# Patient Record
Sex: Female | Born: 1970 | Race: Black or African American | Hispanic: No | Marital: Married | Smoking: Never smoker
Health system: Southern US, Community
[De-identification: ages and names within clinical notes are randomized; demographics above are authoritative.]

## PROBLEM LIST (undated history)

## (undated) DIAGNOSIS — R1310 Dysphagia, unspecified: Secondary | ICD-10-CM

## (undated) DIAGNOSIS — R12 Heartburn: Secondary | ICD-10-CM

## (undated) DIAGNOSIS — R0602 Shortness of breath: Secondary | ICD-10-CM

## (undated) DIAGNOSIS — M1990 Unspecified osteoarthritis, unspecified site: Secondary | ICD-10-CM

## (undated) DIAGNOSIS — R079 Chest pain, unspecified: Secondary | ICD-10-CM

## (undated) DIAGNOSIS — K219 Gastro-esophageal reflux disease without esophagitis: Secondary | ICD-10-CM

## (undated) DIAGNOSIS — J189 Pneumonia, unspecified organism: Secondary | ICD-10-CM

## (undated) DIAGNOSIS — E739 Lactose intolerance, unspecified: Secondary | ICD-10-CM

## (undated) DIAGNOSIS — G4733 Obstructive sleep apnea (adult) (pediatric): Secondary | ICD-10-CM

## (undated) DIAGNOSIS — G43909 Migraine, unspecified, not intractable, without status migrainosus: Secondary | ICD-10-CM

## (undated) DIAGNOSIS — K5900 Constipation, unspecified: Secondary | ICD-10-CM

## (undated) DIAGNOSIS — I10 Essential (primary) hypertension: Secondary | ICD-10-CM

## (undated) DIAGNOSIS — R002 Palpitations: Secondary | ICD-10-CM

## (undated) DIAGNOSIS — E559 Vitamin D deficiency, unspecified: Secondary | ICD-10-CM

## (undated) DIAGNOSIS — Z975 Presence of (intrauterine) contraceptive device: Secondary | ICD-10-CM

## (undated) DIAGNOSIS — Z91018 Allergy to other foods: Secondary | ICD-10-CM

## (undated) DIAGNOSIS — E785 Hyperlipidemia, unspecified: Secondary | ICD-10-CM

## (undated) DIAGNOSIS — E119 Type 2 diabetes mellitus without complications: Secondary | ICD-10-CM

## (undated) DIAGNOSIS — N3010 Interstitial cystitis (chronic) without hematuria: Secondary | ICD-10-CM

## (undated) DIAGNOSIS — M549 Dorsalgia, unspecified: Secondary | ICD-10-CM

## (undated) DIAGNOSIS — M2550 Pain in unspecified joint: Secondary | ICD-10-CM

## (undated) HISTORY — DX: Obstructive sleep apnea (adult) (pediatric): G47.33

## (undated) HISTORY — DX: Lactose intolerance, unspecified: E73.9

## (undated) HISTORY — DX: Heartburn: R12

## (undated) HISTORY — DX: Palpitations: R00.2

## (undated) HISTORY — DX: Shortness of breath: R06.02

## (undated) HISTORY — DX: Essential (primary) hypertension: I10

## (undated) HISTORY — DX: Type 2 diabetes mellitus without complications: E11.9

## (undated) HISTORY — DX: Interstitial cystitis (chronic) without hematuria: N30.10

## (undated) HISTORY — DX: Allergy to other foods: Z91.018

## (undated) HISTORY — DX: Dysphagia, unspecified: R13.10

## (undated) HISTORY — DX: Hyperlipidemia, unspecified: E78.5

## (undated) HISTORY — DX: Pain in unspecified joint: M25.50

## (undated) HISTORY — DX: Unspecified osteoarthritis, unspecified site: M19.90

## (undated) HISTORY — DX: Dorsalgia, unspecified: M54.9

## (undated) HISTORY — DX: Chest pain, unspecified: R07.9

## (undated) HISTORY — DX: Constipation, unspecified: K59.00

## (undated) HISTORY — DX: Vitamin D deficiency, unspecified: E55.9

## (undated) HISTORY — PX: OTHER SURGICAL HISTORY: SHX169

---

## 1997-06-21 ENCOUNTER — Other Ambulatory Visit: Admission: RE | Admit: 1997-06-21 | Discharge: 1997-06-21 | Payer: Self-pay | Admitting: Obstetrics and Gynecology

## 1998-06-10 ENCOUNTER — Other Ambulatory Visit: Admission: RE | Admit: 1998-06-10 | Discharge: 1998-06-10 | Payer: Self-pay | Admitting: Obstetrics and Gynecology

## 1998-06-17 ENCOUNTER — Encounter: Admission: RE | Admit: 1998-06-17 | Discharge: 1998-09-15 | Payer: Self-pay | Admitting: Obstetrics & Gynecology

## 1998-08-02 ENCOUNTER — Inpatient Hospital Stay (HOSPITAL_COMMUNITY): Admission: AD | Admit: 1998-08-02 | Discharge: 1998-08-02 | Payer: Self-pay | Admitting: Obstetrics and Gynecology

## 1998-10-10 ENCOUNTER — Inpatient Hospital Stay (HOSPITAL_COMMUNITY): Admission: AD | Admit: 1998-10-10 | Discharge: 1998-10-10 | Payer: Self-pay | Admitting: Obstetrics and Gynecology

## 1998-11-18 ENCOUNTER — Inpatient Hospital Stay (HOSPITAL_COMMUNITY): Admission: AD | Admit: 1998-11-18 | Discharge: 1998-11-21 | Payer: Self-pay | Admitting: Obstetrics and Gynecology

## 1998-11-23 ENCOUNTER — Inpatient Hospital Stay (HOSPITAL_COMMUNITY): Admission: AD | Admit: 1998-11-23 | Discharge: 1998-11-23 | Payer: Self-pay | Admitting: Obstetrics & Gynecology

## 1998-12-01 ENCOUNTER — Inpatient Hospital Stay (HOSPITAL_COMMUNITY): Admission: AD | Admit: 1998-12-01 | Discharge: 1998-12-04 | Payer: Self-pay | Admitting: Obstetrics and Gynecology

## 1999-01-02 ENCOUNTER — Other Ambulatory Visit: Admission: RE | Admit: 1999-01-02 | Discharge: 1999-01-02 | Payer: Self-pay | Admitting: Obstetrics and Gynecology

## 2000-08-01 ENCOUNTER — Other Ambulatory Visit: Admission: RE | Admit: 2000-08-01 | Discharge: 2000-08-01 | Payer: Self-pay | Admitting: Obstetrics and Gynecology

## 2001-07-28 ENCOUNTER — Emergency Department (HOSPITAL_COMMUNITY): Admission: EM | Admit: 2001-07-28 | Discharge: 2001-07-28 | Payer: Self-pay | Admitting: Emergency Medicine

## 2001-08-01 ENCOUNTER — Encounter: Payer: Self-pay | Admitting: Internal Medicine

## 2001-08-01 ENCOUNTER — Encounter: Admission: RE | Admit: 2001-08-01 | Discharge: 2001-08-01 | Payer: Self-pay | Admitting: Internal Medicine

## 2001-08-04 ENCOUNTER — Other Ambulatory Visit: Admission: RE | Admit: 2001-08-04 | Discharge: 2001-08-04 | Payer: Self-pay | Admitting: Obstetrics and Gynecology

## 2001-09-27 ENCOUNTER — Encounter: Admission: RE | Admit: 2001-09-27 | Discharge: 2001-12-26 | Payer: Self-pay | Admitting: Internal Medicine

## 2002-08-07 ENCOUNTER — Other Ambulatory Visit: Admission: RE | Admit: 2002-08-07 | Discharge: 2002-08-07 | Payer: Self-pay | Admitting: Obstetrics and Gynecology

## 2003-11-15 ENCOUNTER — Other Ambulatory Visit: Admission: RE | Admit: 2003-11-15 | Discharge: 2003-11-15 | Payer: Self-pay | Admitting: Obstetrics and Gynecology

## 2004-09-14 ENCOUNTER — Emergency Department (HOSPITAL_COMMUNITY): Admission: EM | Admit: 2004-09-14 | Discharge: 2004-09-15 | Payer: Self-pay | Admitting: Emergency Medicine

## 2004-09-29 ENCOUNTER — Ambulatory Visit (HOSPITAL_COMMUNITY): Admission: RE | Admit: 2004-09-29 | Discharge: 2004-09-29 | Payer: Self-pay | Admitting: Neurology

## 2005-01-20 ENCOUNTER — Other Ambulatory Visit: Admission: RE | Admit: 2005-01-20 | Discharge: 2005-01-20 | Payer: Self-pay | Admitting: Obstetrics and Gynecology

## 2005-06-15 ENCOUNTER — Encounter (INDEPENDENT_AMBULATORY_CARE_PROVIDER_SITE_OTHER): Payer: Self-pay | Admitting: *Deleted

## 2005-06-15 ENCOUNTER — Ambulatory Visit (HOSPITAL_COMMUNITY): Admission: RE | Admit: 2005-06-15 | Discharge: 2005-06-15 | Payer: Self-pay | Admitting: Obstetrics and Gynecology

## 2006-01-19 ENCOUNTER — Encounter: Admission: RE | Admit: 2006-01-19 | Discharge: 2006-01-19 | Payer: Self-pay | Admitting: Orthopedic Surgery

## 2007-12-07 ENCOUNTER — Encounter: Admission: RE | Admit: 2007-12-07 | Discharge: 2007-12-07 | Payer: Self-pay | Admitting: Specialist

## 2008-09-09 ENCOUNTER — Emergency Department (HOSPITAL_COMMUNITY): Admission: EM | Admit: 2008-09-09 | Discharge: 2008-09-09 | Payer: Self-pay | Admitting: Emergency Medicine

## 2009-01-23 ENCOUNTER — Ambulatory Visit (HOSPITAL_BASED_OUTPATIENT_CLINIC_OR_DEPARTMENT_OTHER): Admission: RE | Admit: 2009-01-23 | Discharge: 2009-01-23 | Payer: Self-pay | Admitting: Urology

## 2009-10-20 ENCOUNTER — Emergency Department (HOSPITAL_COMMUNITY): Admission: EM | Admit: 2009-10-20 | Discharge: 2009-10-21 | Payer: Self-pay | Admitting: Emergency Medicine

## 2010-05-29 LAB — RAPID STREP SCREEN (MED CTR MEBANE ONLY): Streptococcus, Group A Screen (Direct): NEGATIVE

## 2010-06-17 LAB — POCT PREGNANCY, URINE: Preg Test, Ur: NEGATIVE

## 2010-06-22 LAB — URINALYSIS, ROUTINE W REFLEX MICROSCOPIC
Nitrite: NEGATIVE
Protein, ur: NEGATIVE mg/dL
Specific Gravity, Urine: 1.014 (ref 1.005–1.030)
Urobilinogen, UA: 0.2 mg/dL (ref 0.0–1.0)

## 2010-06-22 LAB — DIFFERENTIAL
Basophils Absolute: 0 10*3/uL (ref 0.0–0.1)
Eosinophils Absolute: 0.1 10*3/uL (ref 0.0–0.7)
Eosinophils Relative: 1 % (ref 0–5)
Lymphocytes Relative: 28 % (ref 12–46)
Monocytes Absolute: 0.6 10*3/uL (ref 0.1–1.0)

## 2010-06-22 LAB — CBC
MCV: 84.7 fL (ref 78.0–100.0)
Platelets: 208 10*3/uL (ref 150–400)
WBC: 9.8 10*3/uL (ref 4.0–10.5)

## 2010-06-22 LAB — COMPREHENSIVE METABOLIC PANEL
ALT: 20 U/L (ref 0–35)
AST: 26 U/L (ref 0–37)
Albumin: 3.7 g/dL (ref 3.5–5.2)
Chloride: 108 mEq/L (ref 96–112)
Creatinine, Ser: 0.77 mg/dL (ref 0.4–1.2)
GFR calc Af Amer: >60 mL/min (ref >60)
Potassium: 3.9 mEq/L (ref 3.5–5.1)
Sodium: 139 mEq/L (ref 135–145)
Total Bilirubin: 0.4 mg/dL (ref 0.3–1.2)

## 2010-07-31 NOTE — H&P (Signed)
NAME:  Emily Hester, Emily Hester          ACCOUNT NO.:  [id number]   MEDICAL RECORD NO.:  [id number]          PATIENT TYPE:  AMB   LOCATION:  [facility]                           FACILITY:  [facility]   PHYSICIAN:  Guy Sandifer. Henderson Cloud, M.D. DATE OF BIRTH:  [birth year]   DATE OF ADMISSION:  06/15/2005  DATE OF DISCHARGE:                                HISTORY & PHYSICAL   CHIEF COMPLAINT:  Miscarriage.   HISTORY OF PRESENT ILLNESS:  This patient is a 40 year old African-American  female, G5, P3 with a last menstrual period of April 09, 2005. Ultrasound  on June 07, 2005 was consistent with an intrauterine pregnancy at 6 weeks  and 3 days with a fetal heart beat of 104 beats a minute. Repeat ultrasound  on June 14, 2005 was consistent with an intrauterine pregnancy at 6 weeks  and 2 days with no fetal heart beat on prolonged observation. The patient  has no bleeding or heavy cramping at present. Options are reviewed with the  patient and she is being admitted for dilatation and evacuation. The  potential risks and complications have been reviewed preoperatively.   PAST MEDICAL HISTORY:  1.  History of hypothyroidism no longer on replacement therapy.  2.  Migraine headaches.   PAST SURGICAL HISTORY:  1.  Knee surgery in 1997.  2.  Wisdom tooth extraction.  3.  Vaginal delivery x3, termination x1.   FAMILY HISTORY:  Heart disease maternal grandmother, chronic hypertension  father, maternal grandmother kidney disease, father, asthma in brother,  diabetes in mother, father, sister, brother, paternal grandmother.   MEDICATIONS:  Prenatal vitamins.   ALLERGIES:  AMOXICILLIN leading to swelling and hives. BACTRIM leading to  hives.   SOCIAL HISTORY:  Denies tobacco, alcohol or drug abuse.   REVIEW OF SYSTEMS:  NEUROLOGIC:  History of headache as above. CARDIO:  Denies chest pain. PULMONARY:  Denies shortness of breath. GI:  Denies  recent changes in bowel habits.   PHYSICAL EXAMINATION:  VITAL SIGNS:   Height 5 feet 5 inches. Weight 259.5  pounds. Blood pressure 122/70.  HEENT:  Without thyromegaly.  LUNGS:  Clear to auscultation.  HEART:  Regular rate and rhythm.  BACK:  Without CVA tenderness.  BREASTS:  Not examined.  ABDOMEN:  Obese, soft, nontender without palpable masses.  PELVIC:  Deferred.  EXTREMITIES:  Grossly within normal limits.  NEUROLOGIC:  Grossly within normal limits.   LABORATORY DATA:  Blood type O+, Rh antibody screen negative.   ASSESSMENT:  Spontaneous abortion.   PLAN:  Dilatation and evacuation.      Guy Sandifer Henderson Cloud, M.D.  Electronically Signed     JET/MEDQ  D:  06/15/2005  T:  06/15/2005  Job:  [id number]

## 2010-07-31 NOTE — Op Note (Signed)
NAME:  Emily Hester, Emily Hester          ACCOUNT NO.:  [id number]   MEDICAL RECORD NO.:  [id number]          PATIENT TYPE:  AMB   LOCATION:  [facility]                           [facility]ACILITY:  [facility]   PHYSICIAN:  Guy Sandifer. Henderson Cloud, M.D. DATE OF BIRTH:  [birth year]   DATE OF PROCEDURE:  06/15/2005  DATE OF DISCHARGE:                                 OPERATIVE REPORT   PREOPERATIVE DIAGNOSIS:  Spontaneous abortion.   POSTOPERATIVE DIAGNOSIS:  Spontaneous abortion.   PROCEDURES:  1.  Dilatation and evacuation.  2.  1% Xylocaine paracervical block.   SURGEON:  Guy Sandifer. Henderson Cloud, M.D.   ANESTHESIA:  MAC.   SPECIMENS:  Products of conception.   ESTIMATED BLOOD LOSS:  Minimal.   INDICATIONS AND CONSENT:  This patient is a 40 year old married white  female, G5, P3, who on ultrasound has an intrauterine pregnancy with a crown-  rump length of 6 weeks 2 days with no fetal heart beat on prolonged  examination.  A diagnosis of spontaneous abortion is made.  Options were  discussed and the patient favors dilatation and evacuation.  The potential  risks are discussed preoperatively, including but not limited to infection,  uterine perforation, organ damage, bleeding requiring transfusion of blood  products with possible transfusion reaction, HIV and hepatitis acquisition,  DVT, PE and pneumonia.  All questions were answered and consent is signed on  the chart.   PROCEDURE:  The patient is taken to the operating room, where she is  identified and placed in the dorsal supine position and intravenous sedation  is given.  She is then placed in the dorsal lithotomy position and prepped,  the bladder is straight-catheterized, and draped in a sterile fashion.  A  bivalve speculum is placed in the vagina.  The anterior cervical lip is  injected with 1% Xylocaine and grasped with a single-tooth tenaculum.  A  paracervical block is placed at the 2, 4, 5, 7, 8 and 10 o'clock positions  with approximately 20 mL total of  1% plain Xylocaine.  The cervix is then  gently progressively dilated using a 29 dilator.  A #7 curved curette is  then placed and suction curettage is carried out for obvious products of  conception.  Alternating sharp and suction curettage is carried out  until the cavity is clean.  Pitocin 20 units are added to a liter of IV  fluids after the initial pass of the suction curette.  Good hemostasis is  noted.  All instruments are removed.  All counts are correct.  The patient  is taken to the recovery room in stable condition.      Guy Sandifer Henderson Cloud, M.D.  Electronically Signed     JET/MEDQ  D:  06/15/2005  T:  06/16/2005  Job:  [id number]

## 2010-07-31 NOTE — Procedures (Signed)
HISTORY OF PRESENT ILLNESS:  This is a 40 year old patient who is being  evaluated for seizure-type episode on October 11, 2004.  The patient had  generalized shaking at that time.  The patient has a history of migraine  headaches.  This is a routine EEG.  No skull defects are noted.   EEG CLASSIFICATION:  Normal awake and drowsy.   DESCRIPTION OF RECORDING:  Background rhythms reveal evidence of an 8 Hertz,  fairly well modulated, medium amplitude alpha rhythm that is reactive to eye  opening and closure.  As record progresses, the patient seems to drift in  and out of the drowsy state during the early phases of the recording with  some occasional vertex sharp wave activity seen and evidence of background  slowing in the 7 Hertz range.  The patient eventually undergoes photic  stimulation with an excellent bilateral photic driving response noted.  Hyperventilation is then performed resulting in a good buildup of background  rhythm activities with mild 7 Hertz theta frequency slowing seen during  hyperventilation.  At no time during the recording does there appear to be  evidence of spike or spike wave discharges or evidence of focal slowing.  EKG monitor shows no evidence of cardiac rhythm abnormalities with heart  rate of 84.   IMPRESSION:  This is a normal EEG recording in the awake and drowsy state.  No evidence of ictal or anictal discharges were seen.       EAV:WUJW  D:  09/29/2004 11:00:02  T:  09/29/2004 11:38:39  Job #:  [id number]

## 2010-10-30 ENCOUNTER — Ambulatory Visit
Admission: RE | Admit: 2010-10-30 | Discharge: 2010-10-30 | Disposition: A | Discharge status: Home / Self Care | Payer: 59 | Source: Ambulatory Visit | Attending: Internal Medicine | Admitting: Internal Medicine

## 2010-10-30 ENCOUNTER — Other Ambulatory Visit: Payer: Self-pay | Admitting: Internal Medicine

## 2010-10-30 DIAGNOSIS — R05 Cough: Secondary | ICD-10-CM

## 2010-10-30 DIAGNOSIS — R0602 Shortness of breath: Secondary | ICD-10-CM

## 2011-03-17 ENCOUNTER — Ambulatory Visit: Payer: Self-pay

## 2011-06-12 ENCOUNTER — Ambulatory Visit (INDEPENDENT_AMBULATORY_CARE_PROVIDER_SITE_OTHER): Payer: 59 | Admitting: Family Medicine

## 2011-06-12 ENCOUNTER — Ambulatory Visit: Payer: 59

## 2011-06-12 VITALS — BP 121/81 | HR 74 | Temp 98.9°F | Resp 16 | Ht 65.5 in | Wt 276.0 lb

## 2011-06-12 DIAGNOSIS — M25569 Pain in unspecified knee: Secondary | ICD-10-CM

## 2011-06-12 DIAGNOSIS — M549 Dorsalgia, unspecified: Secondary | ICD-10-CM

## 2011-06-12 DIAGNOSIS — M79609 Pain in unspecified limb: Secondary | ICD-10-CM

## 2011-06-12 DIAGNOSIS — M25579 Pain in unspecified ankle and joints of unspecified foot: Secondary | ICD-10-CM

## 2011-06-12 DIAGNOSIS — M79643 Pain in unspecified hand: Secondary | ICD-10-CM

## 2011-06-12 MED ORDER — CYCLOBENZAPRINE HCL 10 MG PO TABS: 10.0000 mg | ORAL_TABLET | Freq: Every evening | ORAL | Status: AC | PRN

## 2011-06-12 MED ORDER — NAPROXEN 500 MG PO TABS: 500.0000 mg | ORAL_TABLET | Freq: Two times a day (BID) | ORAL | Status: DC

## 2011-06-12 MED ORDER — TRAMADOL HCL 50 MG PO TABS: 50.0000 mg | ORAL_TABLET | Freq: Three times a day (TID) | ORAL | Status: AC | PRN

## 2011-06-12 NOTE — Progress Notes (Signed)
[facility]:  Office Visit  Chief Complaint:  Chief Complaint  Patient presents with  . Back Pain    mid to low back  . Ankle Pain    r ankle    HPI: Emily Hester is a 41 y.o. female who complains of right ankle pain with swelling, radiating pain  at lateral malleoli and some knee pain, and back pain after falling down stairs in front of house. Denies numbness. +Tingling, + limping. No weakness. Associated with Low back pain and Left middle finger swelling. Tried OTC meds without relief.    Past Medical History  Diagnosis Date  . Interstitial cystitis   . Asthma   . Hypertension    Past Surgical History  Procedure Date  . Left knee surgery     History   Social History  . Marital Status: Married    Spouse Name: N/A    Number of Children: N/A  . Years of Education: N/A   Social History Main Topics  . Smoking status: Never Smoker   . Smokeless tobacco: None  . Alcohol Use: No  . Drug Use: No  . Sexually Active: None   Other Topics Concern  . None   Social History Narrative  . None   Family History  Problem Relation Age of Onset  . Diabetes Mother   . Heart disease Mother   . Diabetes Father    Allergies  Allergen Reactions  . Amoxicillin Hives and Swelling  . Bactrim Rash   Prior to Admission medications   Medication Sig Start Date End Date Taking? Authorizing Provider  albuterol (PROVENTIL HFA;VENTOLIN HFA) 108 (90 BASE) MCG/ACT inhaler Inhale 2 puffs into the lungs every 6 (six) hours as needed.   Yes Historical Provider, MD  budesonide-formoterol (SYMBICORT) 160-4.5 MCG/ACT inhaler Inhale 2 puffs into the lungs 2 (two) times daily.   Yes Historical Provider, MD  losartan-hydrochlorothiazide (HYZAAR) 50-12.5 MG per tablet Take 1 tablet by mouth daily.   Yes Historical Provider, MD  nebivolol (BYSTOLIC) 10 MG tablet Take 10 mg by mouth daily.   Yes Historical Provider, MD  pentosan polysulfate (ELMIRON) 100 MG capsule Take 100  mg by mouth 3 (three) times daily as needed.    Historical Provider, MD     ROS: The patient denies fevers, chills, night sweats, unintentional weight loss, chest pain, palpitations, wheezing, dyspnea on exertion, nausea, vomiting, abdominal pain, dysuria, hematuria, melena, numbness, weakness, +tingling. + pain  All other systems have been reviewed and were otherwise negative with the exception of those mentioned in the HPI and as above.    PHYSICAL EXAM: Filed Vitals:   06/12/11 1027  BP: 121/81  Pulse: 74  Temp: 98.9 F (37.2 C)  Resp: 16   Filed Vitals:   06/12/11 1027  Height: 5' 5.5" (1.664 m)  Weight: 276 lb (125.193 kg)   Body mass index is 45.23 kg/(m^2).  General: Alert, no acute distress, morbidly obese HEENT:  Normocephalic, atraumatic, oropharynx patent. EOMI, PERRLA,  Cardiovascular:  Regular rate and rhythm, no rubs murmurs or gallops.  No Carotid bruits, radial pulse intact. No pedal edema.  Respiratory: Clear to auscultation bilaterally.  No wheezes, rales, or rhonchi.  No cyanosis, no use of accessory musculature GI: No organomegaly, abdomen is soft and non-tender, positive bowel sounds.  No masses. Skin: No rashes. Neurologic: Facial musculature symmetric. Psychiatric: Patient is appropriate throughout our interaction. Lymphatic: No cervical lymphadenopathy Musculoskeletal: Gait intact. + normal ankle exam except for tenderness  and swelling at lateral malleoli, +DP pulse. 5/5 strength, sensation intact +hand exam normal for middle left finger, + radila pulse , 5/5 strength, sensation intact + low back pain left paraspinal msk tenderness. Full PROM but not AROM due to pain. Neg straight leg seated.   LABS:    EKG/XRAY:   Primary read interpreted by Dr. Conley Rolls at [facility]. Hand-no fx/dislocation Ankle-no fx/dislocation Back-no fx/dislocation Knee-? arthritis     ASSESSMENT/PLAN: Encounter Diagnoses  Name Primary?  . Knee pain Yes  . Ankle pain   .  Back pain   . Hand pain    Back, Knee, Ankle, Hand  strain/sprain s/p fall Rx Naproxen Rx Flexeril qhs Rx Tramadol prn Work note for light duty F/u in 2 weeks  Jlon Betker PHUONG, DO 06/12/2011 12:03 PM

## 2011-06-24 ENCOUNTER — Ambulatory Visit (INDEPENDENT_AMBULATORY_CARE_PROVIDER_SITE_OTHER): Payer: 59 | Admitting: Family Medicine

## 2011-06-24 VITALS — BP 146/88 | HR 72 | Temp 99.2°F | Resp 16 | Wt 284.6 lb

## 2011-06-24 DIAGNOSIS — S93609A Unspecified sprain of unspecified foot, initial encounter: Secondary | ICD-10-CM

## 2011-06-24 DIAGNOSIS — S93409A Sprain of unspecified ligament of unspecified ankle, initial encounter: Secondary | ICD-10-CM

## 2011-06-24 NOTE — Progress Notes (Signed)
  Subjective:    Patient ID: Emily Hester, female    DOB: 1970-10-04, 41 y.o.   MRN: [id number]  HPI 41 yo female seen 3/30 following a fall for pain in multiple sites.  Hand, back, knee, ankle - all xrays negative.  Wearing CAM on right ankle and on light duty at work.  Pain is better but still hurts some, occ shoots up lateral leg.  Overall better though.  Other injuries all better.   Review of Systems Negative except as per HPI     Objective:   Physical Exam  Constitutional: She appears well-developed.  Pulmonary/Chest: Effort normal.  Neurological: She is alert.   Right ankle - FROM.  Some pain with resisted dorsiflexion.  TTP over ATF lig.         Assessment & Plan:  Ankle sprain - improving.  Now try sweedo with work boots.  Can return to work but no running for another 7-10 days.

## 2011-09-18 ENCOUNTER — Ambulatory Visit (INDEPENDENT_AMBULATORY_CARE_PROVIDER_SITE_OTHER): Payer: 59 | Admitting: Emergency Medicine

## 2011-09-18 VITALS — BP 118/72 | HR 74 | Temp 99.0°F | Resp 18 | Ht 61.0 in | Wt 282.6 lb

## 2011-09-18 DIAGNOSIS — J018 Other acute sinusitis: Secondary | ICD-10-CM

## 2011-09-18 DIAGNOSIS — J029 Acute pharyngitis, unspecified: Secondary | ICD-10-CM

## 2011-09-18 DIAGNOSIS — J40 Bronchitis, not specified as acute or chronic: Secondary | ICD-10-CM

## 2011-09-18 MED ORDER — CEFPROZIL 500 MG PO TABS: 500.0000 mg | ORAL_TABLET | Freq: Two times a day (BID) | ORAL | Status: DC

## 2011-09-18 NOTE — Progress Notes (Signed)
  Subjective:    Patient ID: Emily Hester, female    DOB: 06-16-1970, 41 y.o.   MRN: [id number]  Sore Throat  This is a new problem. The current episode started in the past 7 days. The problem has been unchanged. Neither side of throat is experiencing more pain than the other. The maximum temperature recorded prior to her arrival was 100 - 100.9 F. The pain is at a severity of 3/10. The pain is mild. Associated symptoms include congestion, coughing and a hoarse voice. Pertinent negatives include no abdominal pain, diarrhea, drooling, ear discharge, ear pain, headaches, plugged ear sensation, neck pain, shortness of breath, stridor, swollen glands, trouble swallowing or vomiting. She has tried nothing for the symptoms.  Cough This is a new problem. The current episode started in the past 7 days. The problem has been unchanged. The problem occurs constantly. The cough is non-productive. Associated symptoms include a fever, nasal congestion, postnasal drip and a sore throat. Pertinent negatives include no chest pain, chills, ear congestion, ear pain, headaches, heartburn, hemoptysis, myalgias, rash, rhinorrhea, shortness of breath, sweats, weight loss or wheezing. Nothing aggravates the symptoms. She has tried nothing for the symptoms. There is no history of asthma, bronchiectasis, bronchitis, COPD, emphysema, environmental allergies or pneumonia.      Review of Systems  Constitutional: Positive for fever. Negative for chills and weight loss.  HENT: Positive for congestion, sore throat, hoarse voice and postnasal drip. Negative for ear pain, rhinorrhea, drooling, trouble swallowing, neck pain and ear discharge.   Respiratory: Positive for cough. Negative for hemoptysis, shortness of breath, wheezing and stridor.   Cardiovascular: Negative for chest pain.  Gastrointestinal: Negative for heartburn, vomiting, abdominal pain and diarrhea.  Musculoskeletal: Negative for myalgias.  Skin: Negative for  rash.  Neurological: Negative for headaches.  Hematological: Negative for environmental allergies.  All other systems reviewed and are negative.       Objective:   Physical Exam  Constitutional: She is oriented to person, place, and time. She appears well-developed and well-nourished.  HENT:  Head: Normocephalic and atraumatic.  Right Ear: External ear normal.  Left Ear: External ear normal.  Eyes: Conjunctivae are normal. Pupils are equal, round, and reactive to light. No scleral icterus.  Neck: Normal range of motion. Neck supple.  Cardiovascular: Normal rate and regular rhythm.   Pulmonary/Chest: Effort normal and breath sounds normal.  Abdominal: Soft.  Musculoskeletal: Normal range of motion.  Lymphadenopathy:    She has cervical adenopathy.  Neurological: She is oriented to person, place, and time.  Skin: Skin is warm.          Assessment & Plan:  Incidental pregnancy Cefzil Sudafed Robitussin Follow up as needed

## 2011-09-27 ENCOUNTER — Encounter (HOSPITAL_COMMUNITY): Payer: Self-pay | Admitting: Pharmacist

## 2011-09-27 ENCOUNTER — Other Ambulatory Visit: Payer: Self-pay | Admitting: Obstetrics and Gynecology

## 2011-09-29 MED ORDER — GENTAMICIN SULFATE 40 MG/ML IJ SOLN
5.0000 mg/kg | INTRAVENOUS | Status: AC
  Administered 2011-09-30: 360 mg via INTRAVENOUS
  Filled 2011-09-29: qty 9

## 2011-09-29 MED ORDER — METRONIDAZOLE IN NACL 5-0.79 MG/ML-% IV SOLN
500.0000 mg | INTRAVENOUS | Status: AC
  Administered 2011-09-30: 500 mg via INTRAVENOUS
  Filled 2011-09-29: qty 100

## 2011-09-30 ENCOUNTER — Encounter (HOSPITAL_COMMUNITY): Payer: Self-pay | Admitting: Anesthesiology

## 2011-09-30 ENCOUNTER — Ambulatory Visit (HOSPITAL_COMMUNITY): Payer: 59 | Admitting: Anesthesiology

## 2011-09-30 ENCOUNTER — Ambulatory Visit (HOSPITAL_COMMUNITY)
Admission: AD | Admit: 2011-09-30 | Discharge: 2011-09-30 | Disposition: A | Discharge status: Home / Self Care | Payer: 59 | Source: Ambulatory Visit | Attending: Obstetrics and Gynecology | Admitting: Obstetrics and Gynecology

## 2011-09-30 ENCOUNTER — Encounter (HOSPITAL_COMMUNITY)
Admission: AD | Disposition: A | Discharge status: Home / Self Care | Payer: Self-pay | Source: Ambulatory Visit | Attending: Obstetrics and Gynecology

## 2011-09-30 DIAGNOSIS — O021 Missed abortion: Secondary | ICD-10-CM | POA: Insufficient documentation

## 2011-09-30 HISTORY — PX: DILATION AND EVACUATION: SHX1459

## 2011-09-30 LAB — CBC
MCH: 27 pg (ref 26.0–34.0)
MCHC: 31.9 g/dL (ref 30.0–36.0)
MCV: 84.6 fL (ref 78.0–100.0)
Platelets: 252 10*3/uL (ref 150–400)
RDW: 15.3 % (ref 11.5–15.5)

## 2011-09-30 SURGERY — DILATION AND EVACUATION, UTERUS
Anesthesia: Choice | Site: Vagina | Wound class: Clean Contaminated

## 2011-09-30 MED ORDER — LACTATED RINGERS IV SOLN
INTRAVENOUS | Status: DC | PRN
  Administered 2011-09-30 (×2): via INTRAVENOUS

## 2011-09-30 MED ORDER — IBUPROFEN 200 MG PO TABS: 600.0000 mg | ORAL_TABLET | Freq: Four times a day (QID) | ORAL | Status: AC | PRN

## 2011-09-30 MED ORDER — HYDROCODONE-ACETAMINOPHEN 5-500 MG PO TABS: 1.0000 | ORAL_TABLET | Freq: Four times a day (QID) | ORAL | Status: AC | PRN

## 2011-09-30 MED ORDER — DEXAMETHASONE SODIUM PHOSPHATE 4 MG/ML IJ SOLN
INTRAMUSCULAR | Status: DC | PRN
  Administered 2011-09-30: 4 mg via INTRAVENOUS

## 2011-09-30 MED ORDER — HYDROMORPHONE HCL PF 1 MG/ML IJ SOLN
0.2500 mg | INTRAMUSCULAR | Status: DC | PRN
  Administered 2011-09-30 (×2): 0.5 mg via INTRAVENOUS

## 2011-09-30 MED ORDER — MIDAZOLAM HCL 2 MG/2ML IJ SOLN
INTRAMUSCULAR | Status: AC
  Filled 2011-09-30: qty 2

## 2011-09-30 MED ORDER — GLYCOPYRROLATE 0.2 MG/ML IJ SOLN
INTRAMUSCULAR | Status: DC | PRN
  Administered 2011-09-30: 0.2 mg via INTRAVENOUS

## 2011-09-30 MED ORDER — ONDANSETRON HCL 4 MG/2ML IJ SOLN
INTRAMUSCULAR | Status: DC | PRN
  Administered 2011-09-30: 4 mg via INTRAVENOUS

## 2011-09-30 MED ORDER — HYDROCODONE-ACETAMINOPHEN 5-325 MG PO TABS
1.0000 | ORAL_TABLET | Freq: Once | ORAL | Status: AC
  Administered 2011-09-30: 1 via ORAL

## 2011-09-30 MED ORDER — PROPOFOL 10 MG/ML IV EMUL
INTRAVENOUS | Status: AC
  Filled 2011-09-30: qty 20

## 2011-09-30 MED ORDER — FENTANYL CITRATE 0.05 MG/ML IJ SOLN
INTRAMUSCULAR | Status: DC | PRN
  Administered 2011-09-30: 75 ug via INTRAVENOUS
  Administered 2011-09-30: 25 ug via INTRAVENOUS

## 2011-09-30 MED ORDER — KETOROLAC TROMETHAMINE 30 MG/ML IJ SOLN
INTRAMUSCULAR | Status: AC
  Filled 2011-09-30: qty 1

## 2011-09-30 MED ORDER — GLYCOPYRROLATE 0.2 MG/ML IJ SOLN
INTRAMUSCULAR | Status: AC
  Filled 2011-09-30: qty 1

## 2011-09-30 MED ORDER — PROPOFOL 10 MG/ML IV EMUL
INTRAVENOUS | Status: DC | PRN
  Administered 2011-09-30: 250 mg via INTRAVENOUS
  Administered 2011-09-30: 100 mg via INTRAVENOUS

## 2011-09-30 MED ORDER — LIDOCAINE HCL 1 % IJ SOLN
INTRAMUSCULAR | Status: DC | PRN
  Administered 2011-09-30: 20 mL

## 2011-09-30 MED ORDER — OXYTOCIN 10 UNIT/ML IJ SOLN
INTRAMUSCULAR | Status: DC | PRN
  Administered 2011-09-30: 20 [IU] via INTRAMUSCULAR

## 2011-09-30 MED ORDER — HYDROCODONE-ACETAMINOPHEN 5-325 MG PO TABS
ORAL_TABLET | ORAL | Status: AC
  Filled 2011-09-30: qty 1

## 2011-09-30 MED ORDER — HYDROMORPHONE HCL PF 1 MG/ML IJ SOLN
INTRAMUSCULAR | Status: AC
  Administered 2011-09-30: 0.5 mg via INTRAVENOUS
  Filled 2011-09-30: qty 1

## 2011-09-30 MED ORDER — OXYTOCIN 10 UNIT/ML IJ SOLN
INTRAMUSCULAR | Status: AC
  Filled 2011-09-30: qty 2

## 2011-09-30 MED ORDER — ONDANSETRON HCL 4 MG/2ML IJ SOLN
INTRAMUSCULAR | Status: AC
  Filled 2011-09-30: qty 2

## 2011-09-30 MED ORDER — MIDAZOLAM HCL 5 MG/5ML IJ SOLN
INTRAMUSCULAR | Status: DC | PRN
  Administered 2011-09-30: 2 mg via INTRAVENOUS

## 2011-09-30 MED ORDER — DEXAMETHASONE SODIUM PHOSPHATE 10 MG/ML IJ SOLN
INTRAMUSCULAR | Status: AC
  Filled 2011-09-30: qty 1

## 2011-09-30 MED ORDER — PHENYLEPHRINE HCL 10 MG/ML IJ SOLN
INTRAMUSCULAR | Status: DC | PRN
  Administered 2011-09-30: 80 ug via INTRAVENOUS

## 2011-09-30 MED ORDER — FENTANYL CITRATE 0.05 MG/ML IJ SOLN
INTRAMUSCULAR | Status: AC
  Filled 2011-09-30: qty 2

## 2011-09-30 SURGICAL SUPPLY — 21 items
CATH ROBINSON RED A/P 16FR (CATHETERS) ×2 IMPLANT
CLOTH BEACON ORANGE TIMEOUT ST (SAFETY) ×2 IMPLANT
DECANTER SPIKE VIAL GLASS SM (MISCELLANEOUS) ×2 IMPLANT
GLOVE BIO SURGEON STRL SZ8 (GLOVE) ×4 IMPLANT
GLOVE INDICATOR 7.0 STRL GRN (GLOVE) ×1 IMPLANT
GLOVE NEODERM STER SZ 7 (GLOVE) ×1 IMPLANT
GOWN BRE IMP SLV AUR LG STRL (GOWN DISPOSABLE) ×1 IMPLANT
GOWN PREVENTION PLUS LG XLONG (DISPOSABLE) ×2 IMPLANT
KIT BERKELEY 1ST TRIMESTER 3/8 (MISCELLANEOUS) ×2 IMPLANT
NDL SPNL 22GX3.5 QUINCKE BK (NEEDLE) ×1 IMPLANT
NEEDLE SPNL 22GX3.5 QUINCKE BK (NEEDLE) ×2 IMPLANT
NS IRRIG 1000ML POUR BTL (IV SOLUTION) ×2 IMPLANT
PACK VAGINAL MINOR WOMEN LF (CUSTOM PROCEDURE TRAY) ×2 IMPLANT
PAD PREP 24X48 CUFFED NSTRL (MISCELLANEOUS) ×2 IMPLANT
SET BERKELEY SUCTION TUBING (SUCTIONS) ×2 IMPLANT
SYR CONTROL 10ML LL (SYRINGE) ×2 IMPLANT
TOWEL OR 17X24 6PK STRL BLUE (TOWEL DISPOSABLE) ×4 IMPLANT
VACURETTE 10 RIGID CVD (CANNULA) IMPLANT
VACURETTE 7MM CVD STRL WRAP (CANNULA) ×1 IMPLANT
VACURETTE 8 RIGID CVD (CANNULA) IMPLANT
VACURETTE 9 RIGID CVD (CANNULA) IMPLANT

## 2011-09-30 NOTE — H&P (Signed)
Emily Hester is an 41 y.o. female with MAB documented with IU sac with yolk sac but no fetal pole and falling BHCG. Some cramping but no bleeding.  No SAB after 7-10 days of observation.  Pertinent Gynecological History: Menses: N/A Bleeding: N/A Contraception: none DES exposure: unknown Blood transfusions: none Sexually transmitted diseases: no past history Previous GYN Procedures: N/A  Last mammogram: normal Date: 2012 Last pap: normal Date: 2013 OB History: G1, P0   Menstrual History: Menarche age: unknown Patient's last menstrual period was 05/29/2011.    Past Medical History  Diagnosis Date  . Interstitial cystitis   . Asthma   . Hypertension     Past Surgical History  Procedure Date  . Left knee surgery      Family History  Problem Relation Age of Onset  . Diabetes Mother   . Heart disease Mother   . Diabetes Father     Social History:  reports that she has never smoked. She does not have any smokeless tobacco history on file. She reports that she does not drink alcohol or use illicit drugs.  Allergies:  Allergies  Allergen Reactions  . Amoxicillin Hives and Swelling  . Bactrim Rash    Prescriptions prior to admission  Medication Sig Dispense Refill  . albuterol (PROVENTIL HFA;VENTOLIN HFA) 108 (90 BASE) MCG/ACT inhaler Inhale 2 puffs into the lungs every 6 (six) hours as needed.      . budesonide-formoterol (SYMBICORT) 160-4.5 MCG/ACT inhaler Inhale 2 puffs into the lungs 2 (two) times daily.      . nebivolol (BYSTOLIC) 10 MG tablet Take 10 mg by mouth daily.      . pentosan polysulfate (ELMIRON) 100 MG capsule Take 100 mg by mouth 3 (three) times daily as needed.      Marland Kitchen losartan-hydrochlorothiazide (HYZAAR) 50-12.5 MG per tablet Take 1 tablet by mouth daily.        Review of Systems  Constitutional: Negative for fever.  Respiratory: Negative for shortness of breath.     Blood pressure 130/83, pulse 62, temperature 98.8 F (37.1 C),  temperature source Oral, resp. rate 16, height 5\' 5"  (1.651 m), weight 126.554 kg (279 lb), last menstrual period 05/29/2011, SpO2 100.00%. Physical Exam  Cardiovascular: Normal rate and regular rhythm.   Respiratory: Effort normal and breath sounds normal.  GI: There is no tenderness.    Results for orders placed during the hospital encounter of 09/30/11 (from the past 24 hour(s))  CBC     Status: Normal   Collection Time   09/30/11 12:05 PM      Component Value Range   WBC 10.1  4.0 - 10.5 K/uL   RBC 4.56  3.87 - 5.11 MIL/uL   Hemoglobin 12.3  12.0 - 15.0 g/dL   HCT 16.1  09.6 - 04.5 %   MCV 84.6  78.0 - 100.0 fL   MCH 27.0  26.0 - 34.0 pg   MCHC 31.9  30.0 - 36.0 g/dL   RDW 40.9  81.1 - 91.4 %   Platelets 252  150 - 400 K/uL    No results found.  Assessment/Plan: 41 yo with MAB. Reviewed options . D&Hester reviewed, risks infection, organ damage, bleeding/transfusion-HIV/Hep, DVT/PE, pneumonia.  All questions answered.  Emily Hester,Emily Hester 09/30/2011, 1:42 PM

## 2011-09-30 NOTE — Transfer of Care (Signed)
Immediate Anesthesia Transfer of Care Note  [name]  Procedure(s) Performed: Procedure(s) (LRB): DILATATION AND EVACUATION (N/A)  Patient Location: PACU  Anesthesia Type: General  Level of Consciousness: awake and alert   Airway & Oxygen Therapy: Patient Spontanous Breathing and Patient connected to nasal cannula oxygen  Post-op Assessment: Report given to PACU RN and Post -op Vital signs reviewed and stable  Post vital signs: Reviewed and stable  Complications: No apparent anesthesia complications

## 2011-09-30 NOTE — Anesthesia Postprocedure Evaluation (Signed)
Anesthesia Post Note  [name]  Procedure(s) Performed: Procedure(s) (LRB): DILATATION AND EVACUATION (N/A)  Anesthesia type: MAC  Patient location: PACU  Post pain: Pain level controlled  Post assessment: Post-op Vital signs reviewed  Last Vitals:  Filed Vitals:   09/30/11 1530  BP: 114/64  Pulse: 62  Temp: 36.9 C  Resp: 16    Post vital signs: Reviewed  Level of consciousness: sedated  Complications: No apparent anesthesia complications

## 2011-09-30 NOTE — Brief Op Note (Signed)
09/30/2011  2:20 PM  PATIENT:  Emily Hester  41 y.o. female  PRE-OPERATIVE DIAGNOSIS:  MISSED AB  POST-OPERATIVE DIAGNOSIS:  MISSED AB  PROCEDURE:  Procedure(s) (LRB): DILATATION AND EVACUATION (N/A)  SURGEON:  Surgeon(s) and Role:    * Elenor Andrea, MD - Primary  PHYSICIAN ASSISTANT:   ASSISTANTS: none   ANESTHESIA:   general  EBL:     BLOOD ADMINISTERED:none  DRAINS: none   LOCAL MEDICATIONS USED:  LIDOCAINE   SPECIMEN:  Source of Specimen:  products of conception  DISPOSITION OF SPECIMEN:  PATHOLOGY  COUNTS:  YES  TOURNIQUET:  * No tourniquets in log *  DICTATION: .Other Dictation: Dictation Number [id number]  PLAN OF CARE: Discharge to home after PACU  PATIENT DISPOSITION:  PACU - hemodynamically stable.   Delay start of Pharmacological VTE agent (>24hrs) due to surgical blood loss or risk of bleeding: not applicable

## 2011-09-30 NOTE — Anesthesia Preprocedure Evaluation (Addendum)
Anesthesia Evaluation  Patient identified by MRN, date of birth, ID band Patient awake    Reviewed: Allergy & Precautions, H&P , Patient's Chart, lab work & pertinent test results, reviewed documented beta blocker date and time   Airway Mallampati: II TM Distance: >3 FB Neck ROM: full    Dental No notable dental hx.    Pulmonary asthma (chest clear, doesn't use inhaler daily) ,  breath sounds clear to auscultation  Pulmonary exam normal       Cardiovascular hypertension, Pt. on medications and On Home Beta Blockers Rhythm:regular Rate:Normal     Neuro/Psych    GI/Hepatic   Endo/Other  Morbid obesity  Renal/GU      Musculoskeletal   Abdominal   Peds  Hematology   Anesthesia Other Findings   Reproductive/Obstetrics                           Anesthesia Physical Anesthesia Plan  ASA: III  Anesthesia Plan: General   Post-op Pain Management:    Induction: Intravenous  Airway Management Planned: LMA  Additional Equipment:   Intra-op Plan:   Post-operative Plan:   Informed Consent: I have reviewed the patients History and Physical, chart, labs and discussed the procedure including the risks, benefits and alternatives for the proposed anesthesia with the patient or authorized representative who has indicated his/her understanding and acceptance.   Dental Advisory Given  Plan Discussed with: CRNA and Surgeon  Anesthesia Plan Comments: (  Discussed  general anesthesia, including possible nausea, instrumentation of airway, sore throat,pulmonary aspiration, etc. I asked if the were any outstanding questions, or  concerns before we proceeded. )        Anesthesia Quick Evaluation

## 2011-10-01 ENCOUNTER — Encounter (HOSPITAL_COMMUNITY): Payer: Self-pay | Admitting: Obstetrics and Gynecology

## 2011-10-01 NOTE — Op Note (Signed)
NAME:  Emily Hester, Emily Hester          ACCOUNT NO.:  [id number]  MEDICAL RECORD NO.:  [id number]  LOCATION:  [facility]                          FACILITY:  [facility]  PHYSICIAN:  Guy Sandifer. Henderson Cloud, M.D. DATE OF BIRTH:  02-16-1971  DATE OF PROCEDURE:  09/30/2011 DATE OF DISCHARGE:  09/30/2011                              OPERATIVE REPORT   PREOPERATIVE DIAGNOSIS:  Missed abortion.  POSTOPERATIVE DIAGNOSIS:  Missed abortion.  PROCEDURE:  Dilatation and evacuation.  SURGEON:  Guy Sandifer. Henderson Cloud, M.D.  ANESTHESIA:  General.  SPECIMEN:  Products of conception to Pathology.  ESTIMATED BLOOD LOSS:  Less than 100 mL.  BLOOD TYPE:  O positive.  INDICATIONS AND CONSENT:  This patient is a 41 year old married black female, G1, P0, who has an intrauterine sac with a yolk sac.  However, no fetal pole was noted.  Quantitative hCGs were falling.  She has been following it expectantly for the last 7-10 days with no bleeding.  After discussion of options, she is admitted for dilatation and evacuation. Potential risks and complications were reviewed preoperatively including but not limited to, infection, uterine perforation, organ damage, bleeding requiring transfusion of blood products with HIV and hepatitis acquisition, DVT, PE, pneumonia, laparoscopy, laparotomy, intrauterine synechiae, and secondary infertility.  All questions were answered and consent was signed and on the chart.  DESCRIPTION OF PROCEDURE:  The patient was taken to the operating room where she was identified, placed in dorsal supine position, and general anesthesia was induced.  She was then placed in dorsal lithotomy position.  Time-out was undertaken.  She was prepped, bladder straight catheterized, and draped in a sterile fashion.  Bivalve speculum was placed in the vagina.  Anterior cervical lip was injected with 1% Xylocaine and grasped with single-tooth tenaculum.  Paracervical block was placed at 2, 4, 5, 7, 8, and 10 o'clock  positions with approximately 20 mL of the same solution.  Cervix was gently progressively dilated.  A #7 curved curette was then passed through the cervix and suction curettage was carried out for products of conception.  Alternating sharp and suction curettage was done.  20 units of Pitocin per L of IV fluids was started after the initial pass of the suction curette.  Good hemostasis was noted.  The cavity was clean.  All counts were correct.  Instruments were removed and the patient was transferred to the recovery room in stable condition.     Guy Sandifer Henderson Cloud, M.D.     JET/MEDQ  D:  09/30/2011  T:  10/01/2011  Job:  [id number]

## 2012-01-28 ENCOUNTER — Other Ambulatory Visit: Payer: Self-pay | Admitting: Family Medicine

## 2012-01-28 DIAGNOSIS — E049 Nontoxic goiter, unspecified: Secondary | ICD-10-CM

## 2012-01-31 ENCOUNTER — Other Ambulatory Visit: Payer: 59

## 2012-02-01 ENCOUNTER — Ambulatory Visit
Admission: RE | Admit: 2012-02-01 | Discharge: 2012-02-01 | Disposition: A | Discharge status: Home / Self Care | Payer: 59 | Source: Ambulatory Visit | Attending: Family Medicine | Admitting: Family Medicine

## 2012-02-01 DIAGNOSIS — E049 Nontoxic goiter, unspecified: Secondary | ICD-10-CM

## 2012-04-16 ENCOUNTER — Telehealth: Payer: Self-pay | Admitting: *Deleted

## 2012-04-16 ENCOUNTER — Ambulatory Visit (INDEPENDENT_AMBULATORY_CARE_PROVIDER_SITE_OTHER): Payer: 59 | Admitting: Family Medicine

## 2012-04-16 ENCOUNTER — Ambulatory Visit (HOSPITAL_COMMUNITY)
Admission: RE | Admit: 2012-04-16 | Discharge: 2012-04-16 | Disposition: A | Discharge status: Home / Self Care | Payer: 59 | Source: Ambulatory Visit | Attending: Family Medicine | Admitting: Family Medicine

## 2012-04-16 VITALS — BP 160/81 | HR 71 | Temp 98.3°F | Resp 16 | Ht 67.0 in | Wt 281.0 lb

## 2012-04-16 DIAGNOSIS — R1032 Left lower quadrant pain: Secondary | ICD-10-CM | POA: Insufficient documentation

## 2012-04-16 DIAGNOSIS — N83209 Unspecified ovarian cyst, unspecified side: Secondary | ICD-10-CM | POA: Insufficient documentation

## 2012-04-16 DIAGNOSIS — R110 Nausea: Secondary | ICD-10-CM | POA: Insufficient documentation

## 2012-04-16 DIAGNOSIS — K7689 Other specified diseases of liver: Secondary | ICD-10-CM | POA: Insufficient documentation

## 2012-04-16 DIAGNOSIS — R109 Unspecified abdominal pain: Secondary | ICD-10-CM

## 2012-04-16 DIAGNOSIS — K5900 Constipation, unspecified: Secondary | ICD-10-CM

## 2012-04-16 LAB — POCT UA - MICROSCOPIC ONLY
Casts, Ur, LPF, POC: NEGATIVE
Crystals, Ur, HPF, POC: NEGATIVE

## 2012-04-16 LAB — POCT URINALYSIS DIPSTICK
Blood, UA: NEGATIVE
Ketones, UA: NEGATIVE
Protein, UA: 30
Spec Grav, UA: 1.02
Urobilinogen, UA: 1
pH, UA: 7

## 2012-04-16 LAB — POCT URINE PREGNANCY: Preg Test, Ur: NEGATIVE

## 2012-04-16 LAB — POCT CBC
Hemoglobin: 13.1 g/dL (ref 12.2–16.2)
Lymph, poc: 2.3 (ref 0.6–3.4)
MCH, POC: 26.2 pg — AB (ref 27–31.2)
MCHC: 31 g/dL — AB (ref 31.8–35.4)
MCV: 84.4 fL (ref 80–97)
POC MID %: 4.5 %M (ref 0–12)
RBC: 5 M/uL (ref 4.04–5.48)
WBC: 8.8 10*3/uL (ref 4.6–10.2)

## 2012-04-16 MED ORDER — IOHEXOL 300 MG/ML  SOLN
25.0000 mL | INTRAMUSCULAR | Status: AC
  Administered 2012-04-16 (×2): 25 mL via ORAL

## 2012-04-16 MED ORDER — IOHEXOL 300 MG/ML  SOLN
100.0000 mL | Freq: Once | INTRAMUSCULAR | Status: AC | PRN
  Administered 2012-04-16: 100 mL via INTRAVENOUS

## 2012-04-16 NOTE — Progress Notes (Signed)
[address]   [place], [place]  [address]   [phone]  Subjective:    Patient ID: Emily Hester, female    DOB: 07-10-1970, 42 y.o.   MRN: [id number]  HPIThis 42 y.o. female presents for evaluation of lower abdominal pain.  Onset one week ago.  Evaluated by gyn two days ago; s/p pelvic ultrasound and pelvic exam; obtained labs; pelvic ultrasound normal.  Mild discomfort with pelvic exam. GYN advised if worsened, to present to PCP.  No fever/chills/sweats.  Intermittent nausea; no vomiting; no diarrhea; mild constipation.  Daily bowel movement; straining with bowel movement since 3-4 days.  No dysuria, frequency, urgency, hesitancy, nocturia.  No vaginal discharge.  No night sweats.  No weight loss.  Appetite normal.  Eating makes pain worse.  Pain worse at night.  Pain is constant.  Pain is dull sharp pain; severity 6/10.  Nighttime awakening.  Prescribed antibiotic Levaquin; started two days ago without improvement.  No flatus.  No belching.  Lower abdominal pain and radiates into thighs, R lateral side, lower back.  Hurts to walk; walking very slowly.  No lifting; no unusual activity.  Walks in pain.  Mirena IUD; LMP 04-03-12.     Review of Systems  Constitutional: Negative for fever, chills, diaphoresis and fatigue.  Gastrointestinal: Positive for nausea, abdominal pain and constipation. Negative for vomiting, diarrhea, blood in stool, abdominal distention, anal bleeding and rectal pain.  Genitourinary: Negative for dysuria, urgency, frequency, hematuria, flank pain, vaginal bleeding, vaginal discharge, genital sores, vaginal pain, menstrual problem and pelvic pain.        Past Medical History  Diagnosis Date  . Interstitial cystitis   . Asthma   . Hypertension     Past Surgical History  Procedure Date  . Left knee surgery    . Dilation and evacuation 09/30/2011    Procedure: DILATATION AND EVACUATION;  Surgeon: Branden Andrea, MD;  Location: [facility];  Service: Gynecology;   Laterality: N/A;    Prior to Admission medications   Medication Sig Start Date End Date Taking? Authorizing Provider  albuterol (PROVENTIL HFA;VENTOLIN HFA) 108 (90 BASE) MCG/ACT inhaler Inhale 2 puffs into the lungs every 6 (six) hours as needed.   Yes Historical Provider, MD  amLODipine (NORVASC) 5 MG tablet Take 5 mg by mouth daily.   Yes Historical Provider, MD  budesonide-formoterol (SYMBICORT) 160-4.5 MCG/ACT inhaler Inhale 2 puffs into the lungs 2 (two) times daily.   Yes Historical Provider, MD  levofloxacin (LEVAQUIN) 500 MG tablet Take 500 mg by mouth daily.   Yes Historical Provider, MD  omeprazole (PRILOSEC) 20 MG capsule Take 20 mg by mouth daily.   Yes Historical Provider, MD  pentosan polysulfate (ELMIRON) 100 MG capsule Take 100 mg by mouth 3 (three) times daily as needed.   Yes Historical Provider, MD  losartan-hydrochlorothiazide (HYZAAR) 50-12.5 MG per tablet Take 1 tablet by mouth daily.    Historical Provider, MD  nebivolol (BYSTOLIC) 10 MG tablet Take 10 mg by mouth daily.    Historical Provider, MD    Allergies  Allergen Reactions  . Amoxicillin Hives and Swelling  . Bactrim Rash    History   Social History  . Marital Status: Married    Spouse Name: N/A    Number of Children: N/A  . Years of Education: N/A   Occupational History  . Not on file.   Social History Main Topics  . Smoking status: Never Smoker   . Smokeless tobacco: Not  on file  . Alcohol Use: No  . Drug Use: No  . Sexually Active: Not on file   Other Topics Concern  . Not on file   Social History Narrative  . No narrative on file    Family History  Problem Relation Age of Onset  . Diabetes Mother   . Heart disease Mother   . Diabetes Father     Objective:   Physical Exam  Nursing note and vitals reviewed. Constitutional: She is oriented to person, place, and time. She appears well-developed and well-nourished. No distress.  Eyes: Conjunctivae normal are normal. Pupils are  equal, round, and reactive to light.  Neck: Normal range of motion. Neck supple.  Cardiovascular: Normal rate, regular rhythm and normal heart sounds.  Exam reveals no gallop and no friction rub.   No murmur heard. Pulmonary/Chest: Effort normal and breath sounds normal. She has no wheezes. She has no rales.  Abdominal: Soft. Bowel sounds are normal. She exhibits no distension and no mass. There is no hepatosplenomegaly. There is tenderness in the right lower quadrant and left lower quadrant. There is no rigidity, no rebound, no guarding, no CVA tenderness, no tenderness at McBurney's point and negative Murphy's sign. No hernia.       MILD TTP LLQ>RLQ; NO G/R.  +SUPRAPUBIC TTP.  Lymphadenopathy:    She has no cervical adenopathy.  Neurological: She is alert and oriented to person, place, and time.  Skin: No rash noted. She is not diaphoretic.  Psychiatric: She has a normal mood and affect. Her behavior is normal.   Results for orders placed in visit on 04/16/12  POCT CBC      Component Value Range   WBC 8.8  4.6 - 10.2 K/uL   Lymph, poc 2.3  0.6 - 3.4   POC LYMPH PERCENT 26.5  10 - 50 %L   MID (cbc) 0.4  0 - 0.9   POC MID % 4.5  0 - 12 %M   POC Granulocyte 6.1  2 - 6.9   Granulocyte percent 69.0  37 - 80 %G   RBC 5.00  4.04 - 5.48 M/uL   Hemoglobin 13.1  12.2 - 16.2 g/dL   HCT, POC 40.9  81.1 - 47.9 %   MCV 84.4  80 - 97 fL   MCH, POC 26.2 (*) 27 - 31.2 pg   MCHC 31.0 (*) 31.8 - 35.4 g/dL   RDW, POC 91.4     Platelet Count, POC 289  142 - 424 K/uL   MPV 9.7  0 - 99.8 fL  GLUCOSE, POCT (MANUAL RESULT ENTRY)      Component Value Range   POC Glucose 122 (*) 70 - 99 mg/dl  POCT UA - MICROSCOPIC ONLY      Component Value Range   WBC, Ur, HPF, POC 0-2     RBC, urine, microscopic 0-1     Bacteria, U Microscopic trace     Mucus, UA trace     Epithelial cells, urine per micros 0-4     Crystals, Ur, HPF, POC neg     Casts, Ur, LPF, POC neg     Yeast, UA neg    POCT URINALYSIS  DIPSTICK      Component Value Range   Color, UA yellow     Clarity, UA clear     Glucose, UA neg     Bilirubin, UA neg     Ketones, UA neg     Spec Grav, UA 1.020  Blood, UA neg     pH, UA 7.0     Protein, UA 30     Urobilinogen, UA 1.0     Nitrite, UA neg     Leukocytes, UA Negative         Assessment & Plan:   1. Abdominal pain  POCT CBC, POCT glucose (manual entry), POCT UA - Microscopic Only, POCT urinalysis dipstick, Comprehensive metabolic panel  2. Constipation      1. Abdominal Pain LLQ, suprapubic, RLQ:  New.  Onset one week ago.  S/p GYN consultation two days ago; s/p pelvic and pelvic ultrasound negative.  Normal u/a, CBC.  Refer for CT abd/pelvis with contrast to rule out diverticulitis.  Recommend Tylenol or Motrin for pain. Advised pt that treating abdominal pain with stronger medication not advised.   2.  Constipation: New and mild.  If CT abd/pelvis negative, treat empirically with Miralax daily.  Increase fluid/water intake, fiber intake.

## 2012-04-16 NOTE — Telephone Encounter (Signed)
Advised pt per Dr. Katrinka Blazing of CT. Scan was normal.  It showed only a left ovarian cyst. She should take Miralax once a day for constipation

## 2012-04-16 NOTE — Patient Instructions (Addendum)
1. Abdominal pain  POCT CBC, POCT glucose (manual entry), POCT UA - Microscopic Only, POCT urinalysis dipstick, Comprehensive metabolic panel, CT Abdomen Pelvis W Contrast, POCT urine pregnancy  2. Constipation  POCT urine pregnancy   Go to [facility] for CT now.

## 2012-04-17 LAB — COMPREHENSIVE METABOLIC PANEL
BUN: 11 mg/dL (ref 6–23)
CO2: 26 mEq/L (ref 19–32)
Creat: 0.81 mg/dL (ref 0.50–1.10)
Glucose, Bld: 114 mg/dL — ABNORMAL HIGH (ref 70–99)
Sodium: 137 mEq/L (ref 135–145)
Total Bilirubin: 0.8 mg/dL (ref 0.3–1.2)
Total Protein: 7.2 g/dL (ref 6.0–8.3)

## 2012-04-17 NOTE — Progress Notes (Signed)
Reviewed and agree.

## 2012-05-05 ENCOUNTER — Emergency Department (HOSPITAL_COMMUNITY)
Admission: EM | Admit: 2012-05-05 | Discharge: 2012-05-05 | Disposition: A | Discharge status: Home / Self Care | Payer: 59 | Attending: Emergency Medicine | Admitting: Emergency Medicine

## 2012-05-05 ENCOUNTER — Encounter (HOSPITAL_COMMUNITY): Payer: Self-pay | Admitting: Emergency Medicine

## 2012-05-05 DIAGNOSIS — Z3202 Encounter for pregnancy test, result negative: Secondary | ICD-10-CM | POA: Insufficient documentation

## 2012-05-05 DIAGNOSIS — I10 Essential (primary) hypertension: Secondary | ICD-10-CM | POA: Insufficient documentation

## 2012-05-05 DIAGNOSIS — R110 Nausea: Secondary | ICD-10-CM | POA: Insufficient documentation

## 2012-05-05 DIAGNOSIS — Z8679 Personal history of other diseases of the circulatory system: Secondary | ICD-10-CM | POA: Insufficient documentation

## 2012-05-05 DIAGNOSIS — R109 Unspecified abdominal pain: Secondary | ICD-10-CM | POA: Insufficient documentation

## 2012-05-05 DIAGNOSIS — N3010 Interstitial cystitis (chronic) without hematuria: Secondary | ICD-10-CM | POA: Insufficient documentation

## 2012-05-05 DIAGNOSIS — J45909 Unspecified asthma, uncomplicated: Secondary | ICD-10-CM | POA: Insufficient documentation

## 2012-05-05 DIAGNOSIS — Z79899 Other long term (current) drug therapy: Secondary | ICD-10-CM | POA: Insufficient documentation

## 2012-05-05 HISTORY — DX: Migraine, unspecified, not intractable, without status migrainosus: G43.909

## 2012-05-05 LAB — COMPREHENSIVE METABOLIC PANEL
ALT: 25 U/L (ref 0–35)
AST: 24 U/L (ref 0–37)
CO2: 28 mEq/L (ref 19–32)
Chloride: 97 mEq/L (ref 96–112)
GFR calc non Af Amer: >90 mL/min (ref >90)
Glucose, Bld: 126 mg/dL — ABNORMAL HIGH (ref 70–99)
Sodium: 135 mEq/L (ref 135–145)
Total Bilirubin: 0.5 mg/dL (ref 0.3–1.2)

## 2012-05-05 LAB — CBC WITH DIFFERENTIAL/PLATELET
Basophils Absolute: 0 10*3/uL (ref 0.0–0.1)
HCT: 41.2 % (ref 36.0–46.0)
Lymphocytes Relative: 31 % (ref 12–46)
Lymphs Abs: 3.3 10*3/uL (ref 0.7–4.0)
Monocytes Absolute: 0.7 10*3/uL (ref 0.1–1.0)
Neutro Abs: 6.7 10*3/uL (ref 1.7–7.7)
RBC: 4.97 MIL/uL (ref 3.87–5.11)
RDW: 15.3 % (ref 11.5–15.5)
WBC: 10.8 10*3/uL — ABNORMAL HIGH (ref 4.0–10.5)

## 2012-05-05 LAB — URINE MICROSCOPIC-ADD ON

## 2012-05-05 LAB — URINALYSIS, ROUTINE W REFLEX MICROSCOPIC
Bilirubin Urine: NEGATIVE
Glucose, UA: NEGATIVE mg/dL
Hgb urine dipstick: NEGATIVE
Ketones, ur: NEGATIVE mg/dL
Protein, ur: NEGATIVE mg/dL

## 2012-05-05 MED ORDER — PHENAZOPYRIDINE HCL 200 MG PO TABS: 200.0000 mg | ORAL_TABLET | Freq: Three times a day (TID) | ORAL | Status: DC

## 2012-05-05 MED ORDER — HYDROCODONE-ACETAMINOPHEN 5-325 MG PO TABS: 1.0000 | ORAL_TABLET | Freq: Four times a day (QID) | ORAL | Status: DC | PRN

## 2012-05-05 MED ORDER — KETOROLAC TROMETHAMINE 30 MG/ML IJ SOLN
30.0000 mg | Freq: Once | INTRAMUSCULAR | Status: AC
  Administered 2012-05-05: 30 mg via INTRAVENOUS
  Filled 2012-05-05: qty 1

## 2012-05-05 MED ORDER — POLYETHYLENE GLYCOL 3350 17 GM/SCOOP PO POWD: 17.0000 g | Freq: Every day | ORAL | Status: DC

## 2012-05-05 MED ORDER — ONDANSETRON HCL 4 MG/2ML IJ SOLN
4.0000 mg | Freq: Once | INTRAMUSCULAR | Status: AC
  Administered 2012-05-05: 4 mg via INTRAVENOUS
  Filled 2012-05-05: qty 2

## 2012-05-05 MED ORDER — SODIUM CHLORIDE 0.9 % IV BOLUS (SEPSIS)
1000.0000 mL | Freq: Once | INTRAVENOUS | Status: AC
  Administered 2012-05-05: 1000 mL via INTRAVENOUS

## 2012-05-05 MED ORDER — MORPHINE SULFATE 4 MG/ML IJ SOLN
4.0000 mg | Freq: Once | INTRAMUSCULAR | Status: AC
  Administered 2012-05-05: 4 mg via INTRAVENOUS
  Filled 2012-05-05: qty 1

## 2012-05-05 MED ORDER — PHENAZOPYRIDINE HCL 200 MG PO TABS
200.0000 mg | ORAL_TABLET | Freq: Three times a day (TID) | ORAL | Status: DC
  Administered 2012-05-05: 200 mg via ORAL
  Filled 2012-05-05: qty 1

## 2012-05-05 NOTE — ED Notes (Signed)
Pt c/o suprapubic pain onset yesterday, also c/o L flank pain. Pressure with urination. + nausea. Denies emesis or diarrhea. PWD

## 2012-05-05 NOTE — ED Provider Notes (Signed)
History     CSN: [id number]  Arrival date & time 05/05/12  4098   First MD Initiated Contact with Patient 05/05/12 2010      Chief Complaint  Patient presents with  . Abdominal Pain   HPI  History provided by the patient. Patient is a 42 year old morbidly obese female with history of hypertension, asthma and interstitial cystitis followed by Dr. Annabell Howells with urology who presents with complaints of continued chronic lower suprapubic pains. Patient states that she has had waxing and waning lower abdominal and suprapubic pains for many months. Pain is described as a pressure worse with urination. It has been constant at times lasting several hours. Pain occasionally radiates through to the low back. She denies flank pains. Symptoms became more intensified recently and she was evaluated for these symptoms 2 weeks ago by her OB/GYN and primary care physician. Patient had CT scanning that was unremarkable aside from small left ovarian cyst. Patient was also felt to have some constipation issues at that time. She reports normal regular bowel movements without any straining or hard stools. Denies any diarrhea symptoms. Patient denies any other aggravating or alleviating factors. Denies any hematuria, vomiting, fever, chills or sweats.    Past Medical History  Diagnosis Date  . Interstitial cystitis   . Asthma   . Hypertension   . Migraines     Past Surgical History  Procedure Laterality Date  . Left knee surgery     . Dilation and evacuation  09/30/2011    Procedure: DILATATION AND EVACUATION;  Surgeon: Aira Andrea, MD;  Location: [facility];  Service: Gynecology;  Laterality: N/A;    Family History  Problem Relation Age of Onset  . Diabetes Mother   . Heart disease Mother   . Diabetes Father     History  Substance Use Topics  . Smoking status: Never Smoker   . Smokeless tobacco: Not on file  . Alcohol Use: No    OB History   Grav Para Term Preterm Abortions TAB SAB Ect Mult  Living   1               Review of Systems  Constitutional: Negative for fever and chills.  Respiratory: Negative for shortness of breath.   Cardiovascular: Negative for chest pain.  Gastrointestinal: Positive for nausea and abdominal pain. Negative for vomiting, diarrhea and constipation.  Endocrine: Negative for polydipsia and polyuria.  Genitourinary: Positive for dysuria, urgency and frequency. Negative for hematuria, flank pain, vaginal bleeding, vaginal discharge and menstrual problem.  All other systems reviewed and are negative.    Allergies  Amoxicillin and Bactrim  Home Medications   Current Outpatient Rx  Name  Route  Sig  Dispense  Refill  . amLODipine (NORVASC) 5 MG tablet   Oral   Take 5 mg by mouth every morning.          . budesonide-formoterol (SYMBICORT) 160-4.5 MCG/ACT inhaler   Inhalation   Inhale 2 puffs into the lungs 2 (two) times daily.         . cholecalciferol (VITAMIN D) 1000 UNITS tablet   Oral   Take 1,000 Units by mouth every morning.         . Multiple Vitamin (MULTIVITAMIN WITH MINERALS) TABS   Oral   Take 0.5 tablets by mouth once.         Marland Kitchen omeprazole (PRILOSEC) 20 MG capsule   Oral   Take 20 mg by mouth every morning.          Marland Kitchen  pentosan polysulfate (ELMIRON) 100 MG capsule   Oral   Take 100 mg by mouth 3 (three) times daily before meals.            BP 143/92  Pulse 85  Temp(Src) 98.7 F (37.1 C) (Oral)  Resp 19  Ht 5\' 5"  (1.651 m)  Wt 275 lb (124.739 kg)  BMI 45.76 kg/m2  SpO2 98%  LMP 03/20/2012  Physical Exam  Nursing note and vitals reviewed. Constitutional: She is oriented to person, place, and time. She appears well-developed and well-nourished. No distress.  HENT:  Head: Normocephalic and atraumatic.  Eyes: Conjunctivae are normal.  Cardiovascular: Normal rate and regular rhythm.   No murmur heard. Pulmonary/Chest: Effort normal and breath sounds normal. No respiratory distress. She has no  wheezes.  Abdominal: Soft. She exhibits no distension and no mass. There is tenderness in the suprapubic area. There is no rebound and no guarding.  Morbidly obese. Exam is somewhat limited by body habitus. Patient with lower abdominal discomfort left greater than right. Patient has most tenderness over the suprapubic area. No masses.  Musculoskeletal: Normal range of motion.  Neurological: She is alert and oriented to person, place, and time.  Skin: Skin is warm and dry. No rash noted.  Psychiatric: She has a normal mood and affect. Her behavior is normal.    ED Course  Procedures   Results for orders placed during the hospital encounter of 05/05/12  CBC WITH DIFFERENTIAL      Result Value Range   WBC 10.8 (*) 4.0 - 10.5 K/uL   RBC 4.97  3.87 - 5.11 MIL/uL   Hemoglobin 13.3  12.0 - 15.0 g/dL   HCT 16.1  09.6 - 04.5 %   MCV 82.9  78.0 - 100.0 fL   MCH 26.8  26.0 - 34.0 pg   MCHC 32.3  30.0 - 36.0 g/dL   RDW 40.9  81.1 - 91.4 %   Platelets 259  150 - 400 K/uL   Neutrophils Relative 62  43 - 77 %   Neutro Abs 6.7  1.7 - 7.7 K/uL   Lymphocytes Relative 31  12 - 46 %   Lymphs Abs 3.3  0.7 - 4.0 K/uL   Monocytes Relative 6  3 - 12 %   Monocytes Absolute 0.7  0.1 - 1.0 K/uL   Eosinophils Relative 1  0 - 5 %   Eosinophils Absolute 0.1  0.0 - 0.7 K/uL   Basophils Relative 0  0 - 1 %   Basophils Absolute 0.0  0.0 - 0.1 K/uL  COMPREHENSIVE METABOLIC PANEL      Result Value Range   Sodium 135  135 - 145 mEq/L   Potassium 3.6  3.5 - 5.1 mEq/L   Chloride 97  96 - 112 mEq/L   CO2 28  19 - 32 mEq/L   Glucose, Bld 126 (*) 70 - 99 mg/dL   BUN 8  6 - 23 mg/dL   Creatinine, Ser 7.82  0.50 - 1.10 mg/dL   Calcium 8.9  8.4 - 95.6 mg/dL   Total Protein 7.9  6.0 - 8.3 g/dL   Albumin 3.7  3.5 - 5.2 g/dL   AST 24  0 - 37 U/L   ALT 25  0 - 35 U/L   Alkaline Phosphatase 115  39 - 117 U/L   Total Bilirubin 0.5  0.3 - 1.2 mg/dL   GFR calc non Af Amer >90  >90 mL/min   GFR calc Af Amer >  90  >90  mL/min  LIPASE, BLOOD      Result Value Range   Lipase 17  11 - 59 U/L  URINALYSIS, ROUTINE W REFLEX MICROSCOPIC      Result Value Range   Color, Urine YELLOW  YELLOW   APPearance TURBID (*) CLEAR   Specific Gravity, Urine 1.019  1.005 - 1.030   pH 7.5  5.0 - 8.0   Glucose, UA NEGATIVE  NEGATIVE mg/dL   Hgb urine dipstick NEGATIVE  NEGATIVE   Bilirubin Urine NEGATIVE  NEGATIVE   Ketones, ur NEGATIVE  NEGATIVE mg/dL   Protein, ur NEGATIVE  NEGATIVE mg/dL   Urobilinogen, UA 0.2  0.0 - 1.0 mg/dL   Nitrite NEGATIVE  NEGATIVE   Leukocytes, UA NEGATIVE  NEGATIVE  URINE MICROSCOPIC-ADD ON      Result Value Range   Squamous Epithelial / LPF MANY (*) RARE   Bacteria, UA RARE  RARE   Urine-Other MUCOUS PRESENT    POCT PREGNANCY, URINE      Result Value Range   Preg Test, Ur NEGATIVE  NEGATIVE         1. Abdominal pain   2. Interstitial cystitis       MDM  8:40 PM patient seen and evaluated. Patient resting calmly does not appear in acute distress or significant discomfort. Patient has had chronic issues and was seen earlier this month with workup including non concerning CT scan. It was small left ovarian cyst. Patient was also evaluated at OB/GYN and was told this was felt not to be gynecological. No symptoms currently.  Discussed with patient option for pelvic examination. She states she "just had this done" does not wish to have this done here and would rather followup with OB/GYN if needed. Abdominal exam signs concerning without peritoneal signs. Symptoms are chronic with normal CT scan 2 weeks ago. Doubt any acute abdominal process at this time.        Angus Seller, [place] 05/06/12 [phone]

## 2012-05-08 NOTE — ED Provider Notes (Signed)
Medical screening examination/treatment/procedure(s) were performed by non-physician practitioner and as supervising physician I was immediately available for consultation/collaboration.  Nishanth Mccaughan M Otie Headlee, MD 05/08/12 1852 

## 2012-07-03 ENCOUNTER — Encounter: Payer: Self-pay | Admitting: *Deleted

## 2013-02-05 ENCOUNTER — Other Ambulatory Visit: Payer: Self-pay | Admitting: Family Medicine

## 2013-02-05 DIAGNOSIS — E041 Nontoxic single thyroid nodule: Secondary | ICD-10-CM

## 2013-02-12 ENCOUNTER — Ambulatory Visit
Admission: RE | Admit: 2013-02-12 | Discharge: 2013-02-12 | Disposition: A | Discharge status: Home / Self Care | Payer: 59 | Source: Ambulatory Visit | Attending: Family Medicine | Admitting: Family Medicine

## 2013-02-12 DIAGNOSIS — E041 Nontoxic single thyroid nodule: Secondary | ICD-10-CM

## 2013-04-09 ENCOUNTER — Emergency Department (HOSPITAL_COMMUNITY)
Admission: EM | Admit: 2013-04-09 | Discharge: 2013-04-09 | Disposition: A | Discharge status: Home / Self Care | Payer: 59 | Attending: Emergency Medicine | Admitting: Emergency Medicine

## 2013-04-09 ENCOUNTER — Encounter (HOSPITAL_COMMUNITY): Payer: Self-pay | Admitting: Emergency Medicine

## 2013-04-09 DIAGNOSIS — J039 Acute tonsillitis, unspecified: Secondary | ICD-10-CM

## 2013-04-09 DIAGNOSIS — IMO0002 Reserved for concepts with insufficient information to code with codable children: Secondary | ICD-10-CM | POA: Insufficient documentation

## 2013-04-09 DIAGNOSIS — Z791 Long term (current) use of non-steroidal anti-inflammatories (NSAID): Secondary | ICD-10-CM | POA: Insufficient documentation

## 2013-04-09 DIAGNOSIS — J45901 Unspecified asthma with (acute) exacerbation: Secondary | ICD-10-CM | POA: Insufficient documentation

## 2013-04-09 DIAGNOSIS — I10 Essential (primary) hypertension: Secondary | ICD-10-CM | POA: Insufficient documentation

## 2013-04-09 DIAGNOSIS — G43909 Migraine, unspecified, not intractable, without status migrainosus: Secondary | ICD-10-CM | POA: Insufficient documentation

## 2013-04-09 DIAGNOSIS — Z79899 Other long term (current) drug therapy: Secondary | ICD-10-CM | POA: Insufficient documentation

## 2013-04-09 DIAGNOSIS — Z87448 Personal history of other diseases of urinary system: Secondary | ICD-10-CM | POA: Insufficient documentation

## 2013-04-09 LAB — RAPID STREP SCREEN (MED CTR MEBANE ONLY): Streptococcus, Group A Screen (Direct): NEGATIVE

## 2013-04-09 MED ORDER — PREDNISONE 20 MG PO TABS
60.0000 mg | ORAL_TABLET | Freq: Once | ORAL | Status: AC
  Administered 2013-04-09: 60 mg via ORAL
  Filled 2013-04-09: qty 3

## 2013-04-09 MED ORDER — PREDNISONE 10 MG PO TABS: 10.0000 mg | ORAL_TABLET | Freq: Every day | ORAL | Status: DC

## 2013-04-09 MED ORDER — CLINDAMYCIN PHOSPHATE 900 MG/6ML IJ SOLN: 900.0000 mg | Freq: Once | INTRAMUSCULAR | Status: DC

## 2013-04-09 MED ORDER — CLINDAMYCIN HCL 300 MG PO CAPS
300.0000 mg | ORAL_CAPSULE | Freq: Once | ORAL | Status: DC
  Administered 2013-04-09: 300 mg via ORAL
  Filled 2013-04-09: qty 1

## 2013-04-09 MED ORDER — HYDROCODONE-ACETAMINOPHEN 7.5-325 MG/15ML PO SOLN: ORAL | Status: DC

## 2013-04-09 MED ORDER — HYDROCODONE-ACETAMINOPHEN 7.5-325 MG/15ML PO SOLN
10.0000 mL | Freq: Once | ORAL | Status: AC
  Administered 2013-04-09: 10 mL via ORAL
  Filled 2013-04-09: qty 15

## 2013-04-09 MED ORDER — HYDROCODONE-ACETAMINOPHEN 5-325 MG PO TABS
1.0000 | ORAL_TABLET | Freq: Once | ORAL | Status: DC
  Filled 2013-04-09: qty 1

## 2013-04-09 MED ORDER — CLINDAMYCIN HCL 300 MG PO CAPS: 300.0000 mg | ORAL_CAPSULE | Freq: Once | ORAL | Status: DC

## 2013-04-09 MED ORDER — CLINDAMYCIN HCL 300 MG PO CAPS: 300.0000 mg | ORAL_CAPSULE | Freq: Four times a day (QID) | ORAL | Status: DC

## 2013-04-09 MED ORDER — DIPHENHYDRAMINE HCL 12.5 MG/5ML PO ELIX
25.0000 mg | ORAL_SOLUTION | Freq: Once | ORAL | Status: AC
  Administered 2013-04-09: 25 mg via ORAL
  Filled 2013-04-09: qty 10

## 2013-04-09 NOTE — ED Provider Notes (Signed)
Medical screening examination/treatment/procedure(s) were performed by non-physician practitioner and as supervising physician I was immediately available for consultation/collaboration.  Larnell Granlund, MD 04/09/13 0637 

## 2013-04-09 NOTE — ED Notes (Signed)
Pt arrived to the ED with a compliant of a sore throat.  Pt also has asthma and the sore throat has aggrivated it.  Pt has used her rescue inhaler 3 times in the last 24 hours without relief.  Pt still has her tonsils.

## 2013-04-09 NOTE — ED Provider Notes (Signed)
CSN: [id number]     Arrival date & time 04/09/13  0042 History   First MD Initiated Contact with Patient 04/09/13 [phone]     Chief Complaint  Patient presents with  . Sore Throat   HPI  History provided by the patient. Patient is a 43 year old female with history of hypertension, asthma, migraine headaches, interstitial cystitis who presents with complaints of worsening sore throat. Patient reports having sore throat the past few days which has been worsening. She reports feeling increased tightness and difficulty breathing due to sore throat. She also has difficulty swallowing any food or liquids. Symptoms have been associated with slight congestion and occasional coughing and wheezing from her asthma. Patient has used her albuterol inhaler with some improvement of her wheezing and cough. She continues to have significant discomfort from her sore throat especially when laying flat. She denies any associated fever, chills or sweats. No nausea or vomiting. Denies any known sick contacts. No recent travel.   Past Medical History  Diagnosis Date  . Interstitial cystitis   . Asthma   . Hypertension   . Migraines    Past Surgical History  Procedure Laterality Date  . Left knee surgery     . Dilation and evacuation  09/30/2011    Procedure: DILATATION AND EVACUATION;  Surgeon: Margree Andrea, MD;  Location: [facility];  Service: Gynecology;  Laterality: N/A;   Family History  Problem Relation Age of Onset  . Diabetes Mother   . Heart disease Mother   . Diabetes Father    History  Substance Use Topics  . Smoking status: Never Smoker   . Smokeless tobacco: Not on file  . Alcohol Use: No   OB History   Grav Para Term Preterm Abortions TAB SAB Ect Mult Living   1              Review of Systems  Constitutional: Negative for fever, chills and diaphoresis.  HENT: Positive for congestion and sore throat.   Respiratory: Positive for cough.   Gastrointestinal: Negative for nausea, vomiting  and diarrhea.  All other systems reviewed and are negative.    Allergies  Amoxicillin and Bactrim  Home Medications   Current Outpatient Rx  Name  Route  Sig  Dispense  Refill  . albuterol (PROVENTIL HFA;VENTOLIN HFA) 108 (90 BASE) MCG/ACT inhaler   Inhalation   Inhale 2 puffs into the lungs every 6 (six) hours as needed for wheezing or shortness of breath.         Marland Kitchen amLODipine (NORVASC) 10 MG tablet   Oral   Take 10 mg by mouth every morning.         . beclomethasone (QVAR) 80 MCG/ACT inhaler   Inhalation   Inhale 2 puffs into the lungs 2 (two) times daily.         . cyclobenzaprine (FLEXERIL) 10 MG tablet   Oral   Take 1 tablet by mouth 3 (three) times daily as needed for muscle spasms.          Marland Kitchen ibuprofen (ADVIL,MOTRIN) 200 MG tablet   Oral   Take 400 mg by mouth every 6 (six) hours as needed for moderate pain.         . meloxicam (MOBIC) 15 MG tablet   Oral   Take 1 tablet by mouth every morning.         . mometasone-formoterol (DULERA) 100-5 MCG/ACT AERO   Inhalation   Inhale 2 puffs into the lungs 2 (  two) times daily.         Marland Kitchen. omeprazole (PRILOSEC) 20 MG capsule   Oral   Take 20 mg by mouth every morning.          . pentosan polysulfate (ELMIRON) 100 MG capsule   Oral   Take 100 mg by mouth 3 (three) times daily before meals.          . Pseudoeph-Doxylamine-DM-APAP (NYQUIL PO)   Oral   Take 15 mLs by mouth at bedtime as needed (cold symptoms, sore throat).         . traMADol (ULTRAM) 50 MG tablet   Oral   Take 1 tablet by mouth every 6 (six) hours as needed for moderate pain.           BP 153/87  Pulse 98  Temp(Src) 98.8 F (37.1 C) (Oral)  Ht 5\' 5"  (1.651 m)  Wt 280 lb (127.007 kg)  BMI 46.59 kg/m2  SpO2 98% Physical Exam  Nursing note and vitals reviewed. Constitutional: She is oriented to person, place, and time. She appears well-developed and well-nourished. No distress.  HENT:  Head: Normocephalic.  Mouth/Throat:  Oropharynx is clear and moist.  3+ tonsils with slight erythema. Uvula midline. There appears to be mild exudate of the left tonsil area versus tonsillith  Neck: Normal range of motion. Neck supple.  Cardiovascular: Normal rate and regular rhythm.   No murmur heard. Pulmonary/Chest: Effort normal and breath sounds normal. No respiratory distress. She has no wheezes. She has no rales.  Abdominal: Soft.  Musculoskeletal: Normal range of motion.  Lymphadenopathy:    She has cervical adenopathy.  Neurological: She is alert and oriented to person, place, and time.  Skin: Skin is warm and dry. No rash noted.  Psychiatric: She has a normal mood and affect. Her behavior is normal.    ED Course  Procedures   DIAGNOSTIC STUDIES: Oxygen Saturation is 98% on room air.    COORDINATION OF CARE:  Nursing notes reviewed. Vital signs reviewed. Initial pt interview and examination performed.   3:39 AM-patient seen and evaluated. The patient appears in discomfort but no acute distress. Normal respirations and O2 sats during examination.   Patient was very upset about her long wait in the emergency department. She was also upset that when she was finally brought back was placed in a hallway bed for treatment. Patient was offered an IV or IM medications however she did not wish to receive these in the hallway. I offered for her to wait for a room to become available and after which time she could receive her treatments. She did not wish to do this and was tired of waiting. She was upset that she has seen other patients be taken to room severer empty. She did receive her by mouth medications. At this time she is requesting to return home.   Treatment plan initiated: Medications  clindamycin (CLEOCIN) capsule 300 mg (not administered)  diphenhydrAMINE (BENADRYL) 12.5 MG/5ML elixir 25 mg (25 mg Oral Given 04/09/13 0358)  predniSONE (DELTASONE) tablet 60 mg (60 mg Oral Given 04/09/13 0358)   HYDROcodone-acetaminophen (HYCET) 7.5-325 mg/15 ml solution 10 mL (10 mLs Oral Given 04/09/13 0358)     MDM   1. Tonsillitis        Angus Sellereter S Keri Tavella, PA-C 04/09/13 0430

## 2013-04-09 NOTE — Discharge Instructions (Signed)
You were seen and evaluated for your sore throat symptoms. You have been diagnosed with tonsillitis. Please use the medications as prescribed and followup with her primary care provider or Ear, Nose and Throat specialist for continued evaluation and treatment. Return at any time for changing or worsening symptoms.    Tonsillitis Tonsillitis is an infection of the throat that causes the tonsils to become red, tender, and swollen. Tonsils are collections of lymphoid tissue at the back of the throat. Each tonsil has crevices (crypts). Tonsils help fight nose and throat infections and keep infection from spreading to other parts of the body for the first 18 months of life.  CAUSES Sudden (acute) tonsillitis is usually caused by infection with streptococcal bacteria. Long-lasting (chronic) tonsillitis occurs when the crypts of the tonsils become filled with pieces of food and bacteria, which makes it easy for the tonsils to become repeatedly infected. SYMPTOMS  Symptoms of tonsillitis include:  A sore throat, with possible difficulty swallowing.  White patches on the tonsils.  Fever.  Tiredness.  New episodes of snoring during sleep, when you did not snore before.  Small, foul-smelling, yellowish-white pieces of material (tonsilloliths) that you occasionally cough up or spit out. The tonsilloliths can also cause you to have bad breath. DIAGNOSIS Tonsillitis can be diagnosed through a physical exam. Diagnosis can be confirmed with the results of lab tests, including a throat culture. TREATMENT  The goals of tonsillitis treatment include the reduction of the severity and duration of symptoms and prevention of associated conditions. Symptoms of tonsillitis can be improved with the use of steroids to reduce the swelling. Tonsillitis caused by bacteria can be treated with antibiotics. Usually, treatment with antibiotics is started before the cause of the tonsillitis is known. However, if it is  determined that the cause is not bacterial, antibiotics will not treat the tonsillitis. If attacks of tonsillitis are severe and frequent, your caregiver may recommend surgery to remove the tonsils (tonsillectomy). HOME CARE INSTRUCTIONS   Rest as much as possible and get plenty of sleep.  Drink plenty of fluids. While the throat is very sore, eat soft foods or liquids, such as sherbet, soups, or instant breakfast drinks.  Eat frozen ice pops.  Gargle with a warm or cold liquid to help soothe the throat. Mix 1/4 teaspoon of salt and 1/4 teaspoon of baking soda in in 8 oz of water. SEEK MEDICAL CARE IF:   Large, tender lumps develop in your neck.  A rash develops.  A green, yellow-brown, or bloody substance is coughed up.  You are unable to swallow liquids or food for 24 hours.  You notice that only one of the tonsils is swollen. SEEK IMMEDIATE MEDICAL CARE IF:   You develop any new symptoms such as vomiting, severe headache, stiff neck, chest pain, or trouble breathing or swallowing.  You have severe throat pain along with drooling or voice changes.  You have severe pain, unrelieved with recommended medications.  You are unable to fully open the mouth.  You develop redness, swelling, or severe pain anywhere in the neck.  You have a fever. MAKE SURE YOU:   Understand these instructions.  Will watch your condition.  Will get help right away if you are not doing well or get worse. Document Released: 12/09/2004 Document Revised: 11/01/2012 Document Reviewed: 08/18/2012 [facility] Patient Information 2014 [place]ExitCare, Maryland[place].

## 2013-04-10 LAB — CULTURE, GROUP A STREP

## 2013-10-31 ENCOUNTER — Other Ambulatory Visit: Payer: Self-pay | Admitting: Allergy and Immunology

## 2013-10-31 ENCOUNTER — Other Ambulatory Visit: Payer: Self-pay | Admitting: *Deleted

## 2013-10-31 ENCOUNTER — Ambulatory Visit
Admission: RE | Admit: 2013-10-31 | Discharge: 2013-10-31 | Disposition: A | Discharge status: Home / Self Care | Payer: 59 | Source: Ambulatory Visit | Attending: Allergy and Immunology | Admitting: Allergy and Immunology

## 2013-10-31 DIAGNOSIS — R05 Cough: Secondary | ICD-10-CM

## 2013-10-31 DIAGNOSIS — R059 Cough, unspecified: Secondary | ICD-10-CM

## 2013-10-31 DIAGNOSIS — R079 Chest pain, unspecified: Secondary | ICD-10-CM

## 2014-05-15 ENCOUNTER — Other Ambulatory Visit: Payer: Self-pay | Admitting: Obstetrics and Gynecology

## 2014-05-16 LAB — CYTOLOGY - PAP

## 2014-09-10 ENCOUNTER — Encounter: Payer: Self-pay | Admitting: Dietician

## 2014-09-10 ENCOUNTER — Encounter: Payer: 59 | Attending: Family Medicine | Admitting: Dietician

## 2014-09-10 VITALS — Ht 65.0 in | Wt 282.0 lb

## 2014-09-10 DIAGNOSIS — E119 Type 2 diabetes mellitus without complications: Secondary | ICD-10-CM | POA: Insufficient documentation

## 2014-09-10 DIAGNOSIS — Z713 Dietary counseling and surveillance: Secondary | ICD-10-CM | POA: Diagnosis not present

## 2014-09-10 NOTE — Patient Instructions (Addendum)
Be as active as you can.  Aim for something that you enjoy for physical activity on your days off.  Start with 15 minutes and increase as tolerated. Consider trying stevia/truvia rather than other artificial sweeteners. Aim for 3 Carb Choices per meal (45 grams) +/- 1 either way  Aim for 0-1 Carbs per snack if hungry  Include protein in moderation with your meals and snacks Consider reading food labels for Total Carbohydrate and Fat Grams of foods

## 2014-09-10 NOTE — Progress Notes (Signed)
Diabetes Self-Management Education  Visit Type: First/Initial  Appt. Start Time: 0930 Appt. End Time: 1100  09/10/2014  [name], identified by name and date of birth, is a 44 y.o. female with a diagnosis of Diabetes: Type 2.  Other people present during visit:  Patient, Family Member 57(46 yo daughter)  Patient diagnosed with diabetes 1 month ago.  She has lost from 288 lbs since that time.  She is currently not exercising due to her knee bothering her.  She works nights (3 on 2 off, 2 on 3 off rotating) at a [employer].  She lives with her husband (who smokes inside) and 2 teen daughters.  Patient does the shopping and the cooking.  Patient has been vegetarian in the past.  She avoids pork as this contributes to migraine headaches.  Hx also includes HTN and interstitial cystitis. Patient also reports low vitamin D in the past and vitamin B12 in the low 200's at this time.  TANITA  BODY COMP RESULTS 09/10/14 282 lbs   BMI (kg/m^2) 46.9   Fat Mass (lbs) 151   Fat Free Mass (lbs) 131   Total Body Water (lbs) 96      ASSESSMENT  Height 5\' 5"  (1.651 m), weight 282 lb (127.914 kg). Body mass index is 46.93 kg/(m^2).  Initial Visit Information:  Are you currently following a meal plan?: No   Are you taking your medications as prescribed?: Yes Are you checking your feet?: No   How often do you need to have someone help you when you read instructions, pamphlets, or other written materials from your doctor or pharmacy?: 1 - Never What is the last grade level you completed in school?: 2 years college  Psychosocial:   Patient Belief/Attitude about Diabetes: Motivated to manage diabetes Self-care barriers: None Self-management support: Doctor's office, Family Other persons present: Patient, Family Member 86(46 yo daughter) Patient Concerns: Nutrition/Meal planning, Healthy Lifestyle, Weight Control Special Needs: None Preferred Learning Style: No preference  indicated Learning Readiness: Ready  Complications:   Last HgB A1C per patient/outside source: 6.9 mg/dL (1/61/095/27/16) How often do you check your blood sugar?: 0 times/day (not testing) Number of hypoglycemic episodes per month: 0 Number of hyperglycemic episodes per week: 0 Have you had a dilated eye exam in the past 12 months?: No Have you had a dental exam in the past 12 months?: No  Diet Intake:  Breakfast: 2 slices cheese toast and 2 boiled eggs (6 am if hungry) Lunch: sandwich- [place] and cheese on [employer] Dinner: grilled chicken, veges or grilled chicken salad (11:00) Snack (evening): yogurt or almonds, or popcorn (used to buy cake, cookies, and chips but has stopped this) Beverage(s): water, crystal light, green tea with splenda, occasional coffee with cream and splenda, occasional diet soda  Exercise:  Exercise: ADL's  Individualized Plan for Diabetes Self-Management Training:   Learning Objective:  Patient will have a greater understanding of diabetes self-management.  Patient education plan per assessed needs and concerns is to attend individual sessions for     Education Topics Reviewed with Patient Today:  Definition of diabetes, type 1 and 2, and the diagnosis of diabetes, Factors that contribute to the development of diabetes Role of diet in the treatment of diabetes and the relationship between the three main macronutrients and blood glucose level, Food label reading, portion sizes and measuring food., Carbohydrate counting, Meal timing in regards to the patients' current diabetes medication. Role of exercise on diabetes management, blood pressure control and  cardiac health.       Relationship between chronic complications and blood glucose control, Assessed and discussed foot care and prevention of foot problems, Dental care, Retinopathy and reason for yearly dilated eye exams, Reviewed with patient heart disease, higher risk of, and prevention Role of stress on  diabetes   Lifestyle issues that need to be addressed for better diabetes care  PATIENTS GOALS/Plan (Developed by the patient):  Nutrition: Follow meal plan discussed, General guidelines for healthy choices and portions discussed Physical Activity: Exercise 5-7 days per week, 15 minutes per day Medications: take my medication as prescribed  Plan:   Patient Instructions  Be as active as you can.  Aim for something that you enjoy for physical activity on your days off.  Start with 15 minutes and increase as tolerated. Consider trying stevia/truvia rather than other artificial sweeteners. Aim for 3 Carb Choices per meal (45 grams) +/- 1 either way  Aim for 0-1 Carbs per snack if hungry  Include protein in moderation with your meals and snacks Consider reading food labels for Total Carbohydrate and Fat Grams of foods   Expected Outcomes:  Demonstrated interest in learning. Expect positive outcomes  Education material provided: Living Well with Diabetes, Food label handouts, A1C conversion sheet, Meal plan card, My Plate and Snack sheet  If problems or questions, patient to contact team via:  Phone and Email  Future DSME appointment: 4-6 wks

## 2014-10-08 ENCOUNTER — Ambulatory Visit: Payer: 59 | Admitting: Dietician

## 2014-11-12 ENCOUNTER — Emergency Department (HOSPITAL_BASED_OUTPATIENT_CLINIC_OR_DEPARTMENT_OTHER): Payer: 59

## 2014-11-12 ENCOUNTER — Encounter (HOSPITAL_BASED_OUTPATIENT_CLINIC_OR_DEPARTMENT_OTHER): Payer: Self-pay

## 2014-11-12 ENCOUNTER — Emergency Department (HOSPITAL_BASED_OUTPATIENT_CLINIC_OR_DEPARTMENT_OTHER)
Admission: EM | Admit: 2014-11-12 | Discharge: 2014-11-13 | Disposition: A | Discharge status: Home / Self Care | Payer: 59 | Attending: Emergency Medicine | Admitting: Emergency Medicine

## 2014-11-12 DIAGNOSIS — J45901 Unspecified asthma with (acute) exacerbation: Secondary | ICD-10-CM

## 2014-11-12 DIAGNOSIS — R079 Chest pain, unspecified: Secondary | ICD-10-CM | POA: Diagnosis present

## 2014-11-12 DIAGNOSIS — E119 Type 2 diabetes mellitus without complications: Secondary | ICD-10-CM | POA: Insufficient documentation

## 2014-11-12 DIAGNOSIS — Z87448 Personal history of other diseases of urinary system: Secondary | ICD-10-CM | POA: Diagnosis not present

## 2014-11-12 DIAGNOSIS — Z79899 Other long term (current) drug therapy: Secondary | ICD-10-CM | POA: Insufficient documentation

## 2014-11-12 DIAGNOSIS — G43909 Migraine, unspecified, not intractable, without status migrainosus: Secondary | ICD-10-CM | POA: Diagnosis not present

## 2014-11-12 DIAGNOSIS — I10 Essential (primary) hypertension: Secondary | ICD-10-CM | POA: Diagnosis not present

## 2014-11-12 DIAGNOSIS — Z880 Allergy status to penicillin: Secondary | ICD-10-CM | POA: Insufficient documentation

## 2014-11-12 LAB — CBC WITH DIFFERENTIAL/PLATELET
BASOS ABS: 0 10*3/uL (ref 0.0–0.1)
Basophils Relative: 0 % (ref 0–1)
EOS PCT: 1 % (ref 0–5)
Eosinophils Absolute: 0.1 10*3/uL (ref 0.0–0.7)
HEMATOCRIT: 39.1 % (ref 36.0–46.0)
Hemoglobin: 12.6 g/dL (ref 12.0–15.0)
LYMPHS PCT: 31 % (ref 12–46)
Lymphs Abs: 3.2 10*3/uL (ref 0.7–4.0)
MCH: 26.7 pg (ref 26.0–34.0)
MCHC: 32.2 g/dL (ref 30.0–36.0)
MCV: 82.8 fL (ref 78.0–100.0)
MONO ABS: 0.6 10*3/uL (ref 0.1–1.0)
MONOS PCT: 6 % (ref 3–12)
Neutro Abs: 6.4 10*3/uL (ref 1.7–7.7)
Neutrophils Relative %: 62 % (ref 43–77)
PLATELETS: 255 10*3/uL (ref 150–400)
RBC: 4.72 MIL/uL (ref 3.87–5.11)
RDW: 15.2 % (ref 11.5–15.5)
WBC: 10.3 10*3/uL (ref 4.0–10.5)

## 2014-11-12 MED ORDER — PREDNISONE 50 MG PO TABS
60.0000 mg | ORAL_TABLET | Freq: Once | ORAL | Status: AC
  Administered 2014-11-13: 60 mg via ORAL
  Filled 2014-11-12 (×2): qty 1

## 2014-11-12 MED ORDER — IPRATROPIUM BROMIDE 0.02 % IN SOLN
0.5000 mg | Freq: Once | RESPIRATORY_TRACT | Status: AC
  Administered 2014-11-12: 0.5 mg via RESPIRATORY_TRACT
  Filled 2014-11-12: qty 2.5

## 2014-11-12 MED ORDER — ALBUTEROL SULFATE (2.5 MG/3ML) 0.083% IN NEBU
5.0000 mg | INHALATION_SOLUTION | Freq: Once | RESPIRATORY_TRACT | Status: AC
  Administered 2014-11-12: 5 mg via RESPIRATORY_TRACT
  Filled 2014-11-12: qty 6

## 2014-11-12 NOTE — ED Notes (Addendum)
Pt c/o cough for a week, shortness of breath since yesterday and left side chest heaviness that started today.  Pt states she tried her rescue inhaler without relief.  Lung sounds clear in triage.

## 2014-11-12 NOTE — ED Provider Notes (Signed)
CSN: [id number]     Arrival date & time 11/12/14  2248 History  This chart was scribe for Sorina Derrig, MD by Angelene Giovanni, ED Scribe. The patient was seen in room MH05/MH05 and the patient's care was started at 11:18 PM.    Chief Complaint  Patient presents with  . Chest Pain  . Shortness of Breath   Patient is a 44 y.o. female presenting with chest pain and shortness of breath. The history is provided by the patient. No language interpreter was used.  Chest Pain Pain location:  L chest Pain quality: dull   Pain radiates to:  Does not radiate Pain radiates to the back: no   Pain severity:  Moderate Onset quality:  Gradual Duration:  1 day Timing:  Constant Progression:  Unchanged Chronicity:  Recurrent Context: no stress and no trauma   Relieved by:  None tried Worsened by:  Nothing tried Ineffective treatments:  None tried Associated symptoms: cough and shortness of breath   Associated symptoms: no fever and no palpitations   Associated symptoms comment:  Wheezing Cough:    Cough characteristics:  Non-productive   Severity:  Moderate   Onset quality:  Gradual   Duration:  1 week   Timing:  Intermittent   Progression:  Unchanged Risk factors: no surgery   Shortness of Breath Associated symptoms: chest pain, cough and wheezing   Associated symptoms: no ear pain and no fever    HPI Comments: Emily Hester is a 44 y.o. female with a hx of asthma who presents to the Emergency Department complaining of left-sided CP and shortness of breath with wheezing onset yesterday around 2 pm. She reports associated non-productive cough onset a week ago. She denies any fever, sore throat, ear pain, or nasal congestion. She reports that she has been using her inhaler at home. She states that she is currently on Cingulair for her allergy.  No alleviating factors noted.   Past Medical History  Diagnosis Date  . Interstitial cystitis   . Asthma   . Hypertension   . Migraines    . Diabetes mellitus without complication    Past Surgical History  Procedure Laterality Date  . Left knee surgery     . Dilation and evacuation  09/30/2011    Procedure: DILATATION AND EVACUATION;  Surgeon: Arriyah Andrea, MD;  Location: [facility];  Service: Gynecology;  Laterality: N/A;   Family History  Problem Relation Age of Onset  . Diabetes Mother   . Heart disease Mother   . Diabetes Father    Social History  Substance Use Topics  . Smoking status: Never Smoker   . Smokeless tobacco: None  . Alcohol Use: No   OB History    Gravida Para Term Preterm AB TAB SAB Ectopic Multiple Living   1              Review of Systems  Constitutional: Negative for fever.  HENT: Negative for congestion, ear pain and sinus pressure.   Respiratory: Positive for cough, shortness of breath and wheezing.   Cardiovascular: Positive for chest pain. Negative for palpitations and leg swelling.  All other systems reviewed and are negative.     Allergies  Amoxicillin and Bactrim  Home Medications   Prior to Admission medications   Medication Sig Start Date End Date Taking? Authorizing Provider  albuterol (PROVENTIL HFA;VENTOLIN HFA) 108 (90 BASE) MCG/ACT inhaler Inhale 2 puffs into the lungs every 6 (six) hours as needed for wheezing  or shortness of breath.    Historical Provider, MD  amLODipine (NORVASC) 10 MG tablet Take 10 mg by mouth every morning.    Historical Provider, MD  beclomethasone (QVAR) 80 MCG/ACT inhaler Inhale 2 puffs into the lungs 2 (two) times daily.    Historical Provider, MD  clindamycin (CLEOCIN) 300 MG capsule Take 1 capsule (300 mg total) by mouth 4 (four) times daily. X 7 days Patient not taking: Reported on 09/10/2014 04/09/13   Ivonne Andrew, PA-C  cyclobenzaprine (FLEXERIL) 10 MG tablet Take 1 tablet by mouth 3 (three) times daily as needed for muscle spasms.  03/30/13   Historical Provider, MD  HYDROcodone-acetaminophen (HYCET) 7.5-325 mg/15 ml solution Use 10-15  mL every 4 hours PRN pain Patient not taking: Reported on 09/10/2014 04/09/13   Ivonne Andrew, PA-C  ibuprofen (ADVIL,MOTRIN) 200 MG tablet Take 400 mg by mouth every 6 (six) hours as needed for moderate pain.    Historical Provider, MD  meloxicam (MOBIC) 15 MG tablet Take 1 tablet by mouth every morning. 03/30/13   Historical Provider, MD  metFORMIN (GLUCOPHAGE) 500 MG tablet Take by mouth 2 (two) times daily with a meal.    Historical Provider, MD  mometasone-formoterol (DULERA) 100-5 MCG/ACT AERO Inhale 2 puffs into the lungs 2 (two) times daily.    Historical Provider, MD  omeprazole (PRILOSEC) 20 MG capsule Take 20 mg by mouth every morning.     Historical Provider, MD  pentosan polysulfate (ELMIRON) 100 MG capsule Take 100 mg by mouth 3 (three) times daily before meals.     Historical Provider, MD  predniSONE (DELTASONE) 10 MG tablet Take 1 tablet (10 mg total) by mouth daily. Take 6 tabs on day 1, 5 tabs on day 2, 4 tabs on day 3, 3 tabs on day 4, 2 tabs on day 5, 1 tablet day 6. Patient not taking: Reported on 09/10/2014 04/09/13   Ivonne Andrew, PA-C  Pseudoeph-Doxylamine-DM-APAP (NYQUIL PO) Take 15 mLs by mouth at bedtime as needed (cold symptoms, sore throat).    Historical Provider, MD  traMADol (ULTRAM) 50 MG tablet Take 1 tablet by mouth every 6 (six) hours as needed for moderate pain.  03/30/13   Historical Provider, MD   BP 150/97 mmHg  Pulse 98  Temp(Src) 99 F (37.2 C) (Oral)  Resp 22  Ht  (1.651 m)  Wt 280 lb (127.007 kg)  BMI 46.59 kg/m2  SpO2 100% Physical Exam  Constitutional: She is oriented to person, place, and time. She appears well-developed and well-nourished. No distress.  HENT:  Head: Normocephalic and atraumatic.  Mouth/Throat: Oropharynx is clear and moist.  Clear coloring post-nasal drip No stridor Tracheas midline  Eyes: Conjunctivae and EOM are normal.  Neck: Neck supple. No tracheal deviation present.  Cardiovascular: Normal rate, regular rhythm and  normal heart sounds.   Diminished bilaterally  Pulmonary/Chest: Effort normal. No respiratory distress. She has wheezes. She has no rales. She exhibits no tenderness.  Expiratory wheezes  Abdominal: Soft. Bowel sounds are normal. There is no tenderness. There is no rebound and no guarding.  Musculoskeletal: Normal range of motion. She exhibits no edema or tenderness.  Neurological: She is alert and oriented to person, place, and time.  Skin: Skin is warm and dry.  Psychiatric: She has a normal mood and affect. Her behavior is normal.  Nursing note and vitals reviewed.   ED Course  Procedures (including critical care time) DIAGNOSTIC STUDIES: Oxygen Saturation is 100% on RA, normal by my interpretation.  COORDINATION OF CARE: 11:22 PM- Pt advised of plan for treatment and pt agrees.    Labs Review Labs Reviewed - No data to display  Imaging Review No results found. I have personally reviewed and evaluated these images and lab results as part of my medical decision-making.   EKG Interpretation None      MDM   Final diagnoses:  None    Medications  albuterol (PROVENTIL) (2.5 MG/3ML) 0.083% nebulizer solution 5 mg (5 mg Nebulization Given 11/12/14 2342)  ipratropium (ATROVENT) nebulizer solution 0.5 mg (0.5 mg Nebulization Given 11/12/14 2341)  predniSONE (DELTASONE) tablet 60 mg (60 mg Oral Given 11/13/14 0022)  gi cocktail (Maalox,Lidocaine,Donnatal) (30 mLs Oral Given 11/13/14 0022)  benzonatate (TESSALON) capsule 200 mg (200 mg Oral Given 11/13/14 0022)   Results for orders placed or performed during the hospital encounter of 11/12/14  CBC with Differential/Platelet  Result Value Ref Range   WBC 10.3 4.0 - 10.5 K/uL   RBC 4.72 3.87 - 5.11 MIL/uL   Hemoglobin 12.6 12.0 - 15.0 g/dL   HCT 16.1 09.6 - 04.5 %   MCV 82.8 78.0 - 100.0 fL   MCH 26.7 26.0 - 34.0 pg   MCHC 32.2 30.0 - 36.0 g/dL   RDW 40.9 81.1 - 91.4 %   Platelets 255 150 - 400 K/uL   Neutrophils Relative  % 62 43 - 77 %   Neutro Abs 6.4 1.7 - 7.7 K/uL   Lymphocytes Relative 31 12 - 46 %   Lymphs Abs 3.2 0.7 - 4.0 K/uL   Monocytes Relative 6 3 - 12 %   Monocytes Absolute 0.6 0.1 - 1.0 K/uL   Eosinophils Relative 1 0 - 5 %   Eosinophils Absolute 0.1 0.0 - 0.7 K/uL   Basophils Relative 0 0 - 1 %   Basophils Absolute 0.0 0.0 - 0.1 K/uL  Basic metabolic panel  Result Value Ref Range   Sodium 139 135 - 145 mmol/L   Potassium 3.4 (L) 3.5 - 5.1 mmol/L   Chloride 104 101 - 111 mmol/L   CO2 25 22 - 32 mmol/L   Glucose, Bld 102 (H) 65 - 99 mg/dL   BUN 9 6 - 20 mg/dL   Creatinine, Ser 7.82 0.44 - 1.00 mg/dL   Calcium 8.8 (L) 8.9 - 10.3 mg/dL   GFR calc non Af Amer >60 >60 mL/min   GFR calc Af Amer >60 >60 mL/min   Anion gap 10 5 - 15  Troponin I  Result Value Ref Range   Troponin I <0.03 <0.031 ng/mL   Dg Chest 2 View  11/12/2014   CLINICAL DATA:  Cough for 1 week. Now developing left-sided chest pain  EXAM: CHEST  2 VIEW  COMPARISON:  10/31/2013  FINDINGS: The heart size and mediastinal contours are within normal limits. Both lungs are clear. The visualized skeletal structures are unremarkable.  IMPRESSION: No active cardiopulmonary disease.   Electronically Signed   By: Ellery Plunk M.D.   On: 11/12/2014 23:25     Medications  albuterol (PROVENTIL) (2.5 MG/3ML) 0.083% nebulizer solution 5 mg (5 mg Nebulization Given 11/12/14 2342)  ipratropium (ATROVENT) nebulizer solution 0.5 mg (0.5 mg Nebulization Given 11/12/14 2341)  predniSONE (DELTASONE) tablet 60 mg (60 mg Oral Given 11/13/14 0022)  gi cocktail (Maalox,Lidocaine,Donnatal) (30 mLs Oral Given 11/13/14 0022)  benzonatate (TESSALON) capsule 200 mg (200 mg Oral Given 11/13/14 0022)    Symptoms relieved post medication   Given negative EKG and troponin with >  8 hours of continuing symptoms  Patient has ruled out for MI.  Heart score is 1 and story and presentation are highly atypical for ACS.  Moreover relief with neb indicates a  pulmonary etiology.  Patient has an asthma action plan and we have added steroids and have advised follow up with her pulmonologist.  Strict return precautions given patient verbalizes understanding and agrees to follow up  I personally performed the services described in this documentation, which was scribed in my presence. The recorded information has been reviewed and is accurate.    Cy Blamer, MD 11/13/14 [phone]

## 2014-11-13 ENCOUNTER — Encounter (HOSPITAL_BASED_OUTPATIENT_CLINIC_OR_DEPARTMENT_OTHER): Payer: Self-pay | Admitting: Emergency Medicine

## 2014-11-13 LAB — BASIC METABOLIC PANEL
ANION GAP: 10 (ref 5–15)
BUN: 9 mg/dL (ref 6–20)
CALCIUM: 8.8 mg/dL — AB (ref 8.9–10.3)
CO2: 25 mmol/L (ref 22–32)
CREATININE: 0.76 mg/dL (ref 0.44–1.00)
Chloride: 104 mmol/L (ref 101–111)
GFR calc Af Amer: >60 mL/min (ref >60)
GLUCOSE: 102 mg/dL — AB (ref 65–99)
Potassium: 3.4 mmol/L — ABNORMAL LOW (ref 3.5–5.1)
Sodium: 139 mmol/L (ref 135–145)

## 2014-11-13 LAB — TROPONIN I: Troponin I: <0.03 ng/mL (ref <0.031)

## 2014-11-13 MED ORDER — CETIRIZINE HCL 10 MG PO TABS: 10.0000 mg | ORAL_TABLET | Freq: Every day | ORAL | Status: DC

## 2014-11-13 MED ORDER — GI COCKTAIL ~~LOC~~
30.0000 mL | Freq: Once | ORAL | Status: AC
  Administered 2014-11-13: 30 mL via ORAL
  Filled 2014-11-13: qty 30

## 2014-11-13 MED ORDER — BENZONATATE 100 MG PO CAPS: 100.0000 mg | ORAL_CAPSULE | Freq: Three times a day (TID) | ORAL | Status: DC

## 2014-11-13 MED ORDER — BENZONATATE 100 MG PO CAPS
200.0000 mg | ORAL_CAPSULE | Freq: Once | ORAL | Status: AC
  Administered 2014-11-13: 200 mg via ORAL
  Filled 2014-11-13: qty 2

## 2014-11-13 MED ORDER — FLUTICASONE PROPIONATE 50 MCG/ACT NA SUSP: 2.0000 | Freq: Every day | NASAL | Status: DC

## 2014-11-13 MED ORDER — PREDNISONE 20 MG PO TABS: ORAL_TABLET | ORAL | Status: DC

## 2014-11-13 NOTE — Discharge Instructions (Signed)
Bronchospasm °A bronchospasm is a spasm or tightening of the airways going into the lungs. During a bronchospasm breathing becomes more difficult because the airways get smaller. When this happens there can be coughing, a whistling sound when breathing (wheezing), and difficulty breathing. Bronchospasm is often associated with asthma, but not all patients who experience a bronchospasm have asthma. °CAUSES  °A bronchospasm is caused by inflammation or irritation of the airways. The inflammation or irritation may be triggered by:  °· Allergies (such as to animals, pollen, food, or mold). Allergens that cause bronchospasm may cause wheezing immediately after exposure or many hours later.   °· Infection. Viral infections are believed to be the most common cause of bronchospasm.   °· Exercise.   °· Irritants (such as pollution, cigarette smoke, strong odors, aerosol sprays, and paint fumes).   °· Weather changes. Winds increase molds and pollens in the air. Rain refreshes the air by washing irritants out. Cold air may cause inflammation.   °· Stress and emotional upset.   °SIGNS AND SYMPTOMS  °· Wheezing.   °· Excessive nighttime coughing.   °· Frequent or severe coughing with a simple cold.   °· Chest tightness.   °· Shortness of breath.   °DIAGNOSIS  °Bronchospasm is usually diagnosed through a history and physical exam. Tests, such as chest X-rays, are sometimes done to look for other conditions. °TREATMENT  °· Inhaled medicines can be given to open up your airways and help you breathe. The medicines can be given using either an inhaler or a nebulizer machine. °· Corticosteroid medicines may be given for severe bronchospasm, usually when it is associated with asthma. °HOME CARE INSTRUCTIONS  °· Always have a plan prepared for seeking medical care. Know when to call your health care provider and local emergency services (911 in the [place] Know where you can access local emergency care. °· Only take medicines as  directed by your health care provider. °· If you were prescribed an inhaler or nebulizer machine, ask your health care provider to explain how to use it correctly. Always use a spacer with your inhaler if you were given one. °· It is necessary to remain calm during an attack. Try to relax and breathe more slowly.  °· Control your home environment in the following ways:   °¨ Change your heating and air conditioning filter at least once a month.   °¨ Limit your use of fireplaces and wood stoves. °¨ Do not smoke and do not allow smoking in your home.   °¨ Avoid exposure to perfumes and fragrances.   °¨ Get rid of pests (such as roaches and mice) and their droppings.   °¨ Throw away plants if you see mold on them.   °¨ Keep your house clean and dust free.   °¨ Replace carpet with wood, tile, or vinyl flooring. Carpet can trap dander and dust.   °¨ Use allergy-proof pillows, mattress covers, and box spring covers.   °¨ Wash bed sheets and blankets every week in hot water and dry them in a dryer.   °¨ Use blankets that are made of polyester or cotton.   °¨ Wash hands frequently. °SEEK MEDICAL CARE IF:  °· You have muscle aches.   °· You have chest pain.   °· The sputum changes from clear or white to yellow, green, gray, or bloody.   °· The sputum you cough up gets thicker.   °· There are problems that may be related to the medicine you are given, such as a rash, itching, swelling, or trouble breathing.   °SEEK IMMEDIATE MEDICAL CARE IF:  °· You have worsening wheezing and coughing even   after taking your prescribed medicines.   °· You have increased difficulty breathing.   °· You develop severe chest pain. °MAKE SURE YOU:  °· Understand these instructions. °· Will watch your condition. °· Will get help right away if you are not doing well or get worse. °Document Released: 03/04/2003 Document Revised: 03/06/2013 Document Reviewed: 08/21/2012 °ExitCare® Patient Information ©2015 [employer]. This information is not  intended to replace advice given to you by your health care provider. Make sure you discuss any questions you have with your health care provider. ° °

## 2015-01-22 ENCOUNTER — Ambulatory Visit: Payer: Self-pay | Admitting: Allergy and Immunology

## 2015-02-11 ENCOUNTER — Other Ambulatory Visit: Payer: Self-pay | Admitting: Family Medicine

## 2015-02-11 DIAGNOSIS — E041 Nontoxic single thyroid nodule: Secondary | ICD-10-CM

## 2015-02-12 ENCOUNTER — Other Ambulatory Visit: Payer: 59

## 2015-02-13 ENCOUNTER — Ambulatory Visit
Admission: RE | Admit: 2015-02-13 | Discharge: 2015-02-13 | Disposition: A | Discharge status: Home / Self Care | Payer: 59 | Source: Ambulatory Visit | Attending: Family Medicine | Admitting: Family Medicine

## 2015-02-13 DIAGNOSIS — E041 Nontoxic single thyroid nodule: Secondary | ICD-10-CM

## 2015-04-21 ENCOUNTER — Telehealth: Payer: Self-pay

## 2015-04-21 NOTE — Telephone Encounter (Addendum)
Called and left voicemail for patient. Found papers from [facility] in patients chart and need to see what she needs .

## 2015-04-21 NOTE — Telephone Encounter (Signed)
Patient lost paper work for [employer] and is wondering could we redo the paper work for her. [facility] doesn't have the neb on file.  Please Advise  Thanks

## 2015-04-21 NOTE — Telephone Encounter (Signed)
Sent information to [facility] per patient and patient will contact us back if any problems or questions.

## 2016-07-15 ENCOUNTER — Other Ambulatory Visit: Payer: Self-pay | Admitting: Orthopaedic Surgery

## 2016-07-15 DIAGNOSIS — M545 Low back pain: Secondary | ICD-10-CM

## 2016-07-28 ENCOUNTER — Ambulatory Visit
Admission: RE | Admit: 2016-07-28 | Discharge: 2016-07-28 | Disposition: A | Discharge status: Home / Self Care | Payer: 59 | Source: Ambulatory Visit | Attending: Orthopaedic Surgery | Admitting: Orthopaedic Surgery

## 2016-07-28 DIAGNOSIS — M545 Low back pain: Secondary | ICD-10-CM

## 2016-11-26 ENCOUNTER — Ambulatory Visit: Payer: 59 | Admitting: Allergy

## 2016-12-02 ENCOUNTER — Ambulatory Visit (INDEPENDENT_AMBULATORY_CARE_PROVIDER_SITE_OTHER): Payer: 59 | Admitting: Allergy

## 2016-12-02 ENCOUNTER — Encounter: Payer: Self-pay | Admitting: Allergy

## 2016-12-02 VITALS — BP 142/88 | HR 79 | Temp 98.7°F | Resp 20 | Ht 65.0 in | Wt 282.8 lb

## 2016-12-02 DIAGNOSIS — J310 Chronic rhinitis: Secondary | ICD-10-CM

## 2016-12-02 DIAGNOSIS — J4520 Mild intermittent asthma, uncomplicated: Secondary | ICD-10-CM

## 2016-12-02 MED ORDER — CETIRIZINE HCL 10 MG PO TABS: 10.0000 mg | ORAL_TABLET | Freq: Every day | ORAL | 5 refills | Status: DC

## 2016-12-02 MED ORDER — OMEPRAZOLE 20 MG PO CPDR: 20.0000 mg | DELAYED_RELEASE_CAPSULE | Freq: Every morning | ORAL | 5 refills | Status: DC

## 2016-12-02 MED ORDER — MONTELUKAST SODIUM 10 MG PO TABS: 10.0000 mg | ORAL_TABLET | Freq: Every day | ORAL | 5 refills | Status: DC

## 2016-12-02 MED ORDER — CETIRIZINE HCL 10 MG PO TABS: 10.0000 mg | ORAL_TABLET | Freq: Every day | ORAL | 0 refills | Status: DC

## 2016-12-02 MED ORDER — FLUTICASONE-SALMETEROL 115-21 MCG/ACT IN AERO: 2.0000 | INHALATION_SPRAY | Freq: Two times a day (BID) | RESPIRATORY_TRACT | 5 refills | Status: DC

## 2016-12-02 MED ORDER — ALBUTEROL SULFATE HFA 108 (90 BASE) MCG/ACT IN AERS: 2.0000 | INHALATION_SPRAY | RESPIRATORY_TRACT | 1 refills | Status: DC | PRN

## 2016-12-02 NOTE — Addendum Note (Signed)
Addended by: Bennye Alm on: 12/02/2016 01:53 PM   Modules accepted: Orders

## 2016-12-02 NOTE — Patient Instructions (Addendum)
1. Allergic Rhinitis - Sample for Dymista 1 spray twice daily given this visit. If providing relief of post-nasal drip, can try Flonase plus Azelastine together. - Start Zyrtec (Cetirizine) 10 mg daily. Prescription sent. - Continue Singulair (Montelukast) nightly  2. Asthma - Complete Dulera 100 mcg samples at home. Prescription for Advair 115 mcg HFA sent to use afterwards. - Continue Albuterol rescue as needed - Continue Singulair (Montelukast) nightly - Follow up within 4 months
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
## 2016-12-03 ENCOUNTER — Other Ambulatory Visit: Payer: Self-pay | Admitting: Orthopedic Surgery

## 2016-12-03 DIAGNOSIS — M25551 Pain in right hip: Secondary | ICD-10-CM

## 2016-12-03 DIAGNOSIS — M7061 Trochanteric bursitis, right hip: Secondary | ICD-10-CM

## 2016-12-03 DIAGNOSIS — M5416 Radiculopathy, lumbar region: Secondary | ICD-10-CM

## 2016-12-03 DIAGNOSIS — M25559 Pain in unspecified hip: Secondary | ICD-10-CM

## 2016-12-15 ENCOUNTER — Ambulatory Visit
Admission: RE | Admit: 2016-12-15 | Discharge: 2016-12-15 | Disposition: A | Discharge status: Home / Self Care | Payer: 59 | Source: Ambulatory Visit | Attending: Orthopedic Surgery | Admitting: Orthopedic Surgery

## 2016-12-15 DIAGNOSIS — M7061 Trochanteric bursitis, right hip: Secondary | ICD-10-CM

## 2016-12-15 DIAGNOSIS — M5416 Radiculopathy, lumbar region: Secondary | ICD-10-CM

## 2016-12-15 DIAGNOSIS — M25551 Pain in right hip: Secondary | ICD-10-CM

## 2016-12-15 DIAGNOSIS — M25559 Pain in unspecified hip: Secondary | ICD-10-CM

## 2017-01-13 DIAGNOSIS — Z975 Presence of (intrauterine) contraceptive device: Secondary | ICD-10-CM | POA: Insufficient documentation

## 2017-01-27 ENCOUNTER — Ambulatory Visit (INDEPENDENT_AMBULATORY_CARE_PROVIDER_SITE_OTHER): Payer: 59 | Admitting: Orthopaedic Surgery

## 2017-01-27 ENCOUNTER — Encounter (INDEPENDENT_AMBULATORY_CARE_PROVIDER_SITE_OTHER): Payer: Self-pay | Admitting: Orthopaedic Surgery

## 2017-01-27 ENCOUNTER — Ambulatory Visit (INDEPENDENT_AMBULATORY_CARE_PROVIDER_SITE_OTHER): Payer: 59

## 2017-01-27 DIAGNOSIS — M25551 Pain in right hip: Secondary | ICD-10-CM

## 2017-01-27 DIAGNOSIS — M1611 Unilateral primary osteoarthritis, right hip: Secondary | ICD-10-CM

## 2017-01-27 NOTE — Progress Notes (Signed)
Office Visit Note   [name]           Date of Birth: 06/26/1970           MRN: [id number] Visit Date: 01/27/2017              Requested by: Merri BrunetteSmith, Candace, MD [phone] [address] [place], [place] [address] PCP: Merri BrunetteSmith, Candace, MD   Assessment & Plan: Visit Diagnoses:  1. Pain in right hip   2. Unilateral primary osteoarthritis, right hip     Plan: We had a long and thorough discussion about hip replacement surgery.  Although her body mass index is 48 on examination I see an easier plane to actually get to her hip with just having her lay supine.  Her adipose tissue falls away and I can have an easy plane of getting to her hip.  Although she is a diabetic her hemoglobin A1c is below 7.  Her pain is daily and is detrimentally affect directed daily living, quality of life, and mobility.  She has had multiple injections and is worsening for over 2 years now.  The steroid injection of her hip socket.  She understands fully the risk and benefits of surgery and we had a long thorough discussion about the surgery showed her hip model and gave her handouts about it as well.  We talked about the risks her height and given her obesity and diabetes.  I feel comfortable and confident with proceeding with the replacement surgery as that she.  We will work on getting this scheduled in the near future.  All questions and concerns were answered and addressed.  Follow-Up Instructions: Return for 2 weeks post-op.   Orders:  Orders Placed This Encounter  Procedures  . XR HIP UNILAT W OR W/O PELVIS 2-3 VIEWS RIGHT   No orders of the defined types were placed in this encounter.     Procedures: No procedures performed   Clinical Data: No additional findings.   Subjective: No chief complaint on file. The patient is a very pleasant 46 year old female who is sent to me from another orthopedic surgeon in town to consider a right total hip arthroplasty due to severe  arthritis of her right hip.  He did not feel confident performing the surgery given her body mass index of 48.  He is a diabetic but under excellent control with a hemoglobin A1c of below 7.  Her pain is daily and is 10 out of 10.  He is been seeing her for several years now and has had at least tried intra-articular steroid injections of the head did help temporize things for a while but is worsening.  He is worked on [employer] control and activity modification.  He is tried anti-inflammatories as well.  After several years apart and is worsening this is detrimentally affect directed daily living, quality of life, and mobility.  At this point she does wish to proceed with a total hip arthroplasty.  She has plain films and an MRI that I have reviewed entirely independently and does show severe arthritis of her right hip.  HPI  Review of Systems He currently denies any headache, chest pain, shortness of breath, fever, chills, nausea, vomiting.  Objective: Vital Signs: There were no vitals taken for this visit.  Physical Exam Is alert and oriented x3 and in no acute distress Ortho Exam Examination of her left hip is normal.  Examination of her right hip shows severe pain with attempts  of internal and external rotation.  I was able to later in supine position and get a good understanding of how her soft tissue folds lay and how I can easily mobilized these to get to her right hip. Specialty Comments:  No specialty comments available.  Imaging: Xr Hip Unilat W Or W/o Pelvis 2-3 Views Right  Result Date: 01/27/2017 An AP pelvis and a lateral of her right hip show severe end-stage arthritis of the right hip.  There is significant joint space narrowing as well as sclerotic changes.  There are cystic changes in the acetabulum.  There are para-articular osteophytes as well.    PMFS History: Patient Active Problem List   Diagnosis Date Noted  . Unilateral primary osteoarthritis, right hip 01/27/2017     Past Medical History:  Diagnosis Date  . Asthma   . Diabetes mellitus without complication (HCC)   . Hypertension   . Interstitial cystitis   . Migraines     Family History  Problem Relation Age of Onset  . Diabetes Mother   . Heart disease Mother   . Diabetes Father   . Eczema Sister   . Asthma Brother   . Asthma Son   . Eczema Daughter   . Allergic rhinitis Neg Hx   . Angioedema Neg Hx   . Immunodeficiency Neg Hx   . Urticaria Neg Hx     Past Surgical History:  Procedure Laterality Date  . DILATION AND EVACUATION  09/30/2011   Procedure: DILATATION AND EVACUATION;  Surgeon: Giliana AndreaJames E Tomblin II, MD;  Location: [facility];  Service: Gynecology;  Laterality: N/A;  . left knee surgery      Social History   Occupational History  . Not on file  Tobacco Use  . Smoking status: Never Smoker  . Smokeless tobacco: Never Used  Substance and Sexual Activity  . Alcohol use: No  . Drug use: No  . Sexual activity: Not on file

## 2017-02-15 ENCOUNTER — Telehealth (INDEPENDENT_AMBULATORY_CARE_PROVIDER_SITE_OTHER): Payer: Self-pay | Admitting: Orthopaedic Surgery

## 2017-02-15 NOTE — Telephone Encounter (Signed)
Patient would really like to speak with you regarding her disability claim, if you could give her a call back at [phone]

## 2017-03-02 ENCOUNTER — Other Ambulatory Visit (INDEPENDENT_AMBULATORY_CARE_PROVIDER_SITE_OTHER): Payer: Self-pay | Admitting: Orthopaedic Surgery

## 2017-03-02 ENCOUNTER — Other Ambulatory Visit (HOSPITAL_COMMUNITY): Payer: Self-pay | Admitting: Emergency Medicine

## 2017-03-02 DIAGNOSIS — M1611 Unilateral primary osteoarthritis, right hip: Secondary | ICD-10-CM

## 2017-03-02 NOTE — Patient Instructions (Signed)
Claudina LickLeslie R Eisenberg  03/02/2017   Your procedure is scheduled on: 03-03-17  [facility]     Report to admitting at 10:30AM   Call this number if you have problems the morning of surgery  [phone]   Remember: ONLY 1 PERSON MAY GO WITH YOU TO SHORT STAY TO GET  READY MORNING OF YOUR SURGERY.  Do not eat food After Midnight. YOU MAY HAVE CLEAR LIQUIDS FROM MIDNIGHT UNTIL 7AM DAY OF SURGERY. NOTHING BY MOUTH AFTER 7AM!     Take these medicines the morning of surgery with A SIP OF WATER: AMLODIPINE, OMEPRAZOLE, TYLENOL IF NEEDED, INHALERS IF NEEDED( MAY BRING TO HOSPITAL), ROSUVASTATIN                                 You may not have any metal on your body including hair pins and              piercings  Do not wear jewelry, make-up, lotions, powders or perfumes, deodorant             Do not wear nail polish.  Do not shave  48 hours prior to surgery.        Do not bring valuables to the hospital. [facility] IS NOT             RESPONSIBLE   FOR VALUABLES.  Contacts, dentures or bridgework may not be worn into surgery.  Leave suitcase in the car. After surgery it may be brought to your room.                 Please read over the following fact sheets you were given: _____________________________________________________________________   CLEAR LIQUID DIET   Foods Allowed                                                                     Foods Excluded  Coffee and tea, regular and decaf                             liquids that you cannot  Plain Jell-O in any flavor                                             see through such as: Fruit ices (not with fruit pulp)                                     milk, soups, orange juice  Iced Popsicles                                    All solid food Carbonated beverages, regular and diet  Cranberry, grape and apple juices Sports drinks like  Gatorade Lightly seasoned clear broth or consume(fat free) Sugar, honey syrup  Sample Menu Breakfast                                Lunch                                     Supper Cranberry juice                    Beef broth                            Chicken broth Jell-O                                     Grape juice                           Apple juice Coffee or tea                        Jell-O                                      Popsicle                                                Coffee or tea                        Coffee or tea  _____________________________________________________________________  [facility] - Preparing for Surgery Before surgery, you can play an important role.  Because skin is not sterile, your skin needs to be as free of germs as possible.  You can reduce the number of germs on your skin by washing with CHG (chlorahexidine gluconate) soap before surgery.  CHG is an antiseptic cleaner which kills germs and bonds with the skin to continue killing germs even after washing. Please DO NOT use if you have an allergy to CHG or antibacterial soaps.  If your skin becomes reddened/irritated stop using the CHG and inform your nurse when you arrive at Short Stay. Do not shave (including legs and underarms) for at least 48 hours prior to the first CHG shower.  You may shave your face/neck. Please follow these instructions carefully:  1.  Shower with CHG Soap the night before surgery and the  morning of Surgery.  2.  If you choose to wash your hair, wash your hair first as usual with your  normal  shampoo.  3.  After you shampoo, rinse your hair and body thoroughly to remove the  shampoo.                           4.  Use CHG as you would any other liquid soap.  You can apply chg directly  to the skin and wash  Gently with a scrungie or clean washcloth.  5.  Apply the CHG Soap to your body ONLY FROM THE NECK DOWN.   Do not use on face/ open                            Wound or open sores. Avoid contact with eyes, ears mouth and genitals (private parts).                       Wash face,  Genitals (private parts) with your normal soap.             6.  Wash thoroughly, paying special attention to the area where your surgery  will be performed.  7.  Thoroughly rinse your body with warm water from the neck down.  8.  DO NOT shower/wash with your normal soap after using and rinsing off  the CHG Soap.                9.  Pat yourself dry with a clean towel.            10.  Wear clean pajamas.            11.  Place clean sheets on your bed the night of your first shower and do not  sleep with pets. Day of Surgery : Do not apply any lotions/deodorants the morning of surgery.  Please wear clean clothes to the hospital/surgery center.  FAILURE TO FOLLOW THESE INSTRUCTIONS MAY RESULT IN THE CANCELLATION OF YOUR SURGERY PATIENT SIGNATURE_________________________________  NURSE SIGNATURE__________________________________  ________________________________________________________________________  How to Manage Your Diabetes Before and After Surgery  Why is it important to control my blood sugar before and after surgery? . Improving blood sugar levels before and after surgery helps healing and can limit problems. . A way of improving blood sugar control is eating a healthy diet by: o  Eating less sugar and carbohydrates o  Increasing activity/exercise o  Talking with your doctor about reaching your blood sugar goals . High blood sugars (greater than 180 mg/dL) can raise your risk of infections and slow your recovery, so you will need to focus on controlling your diabetes during the weeks before surgery. . Make sure that the doctor who takes care of your diabetes knows about your planned surgery including the date and location.  How do I manage my blood sugar before surgery? . Check your blood sugar at least 4 times a day, starting 2 days before  surgery, to make sure that the level is not too high or low. o Check your blood sugar the morning of your surgery when you wake up and every 2 hours until you get to the Short Stay unit. . If your blood sugar is less than 70 mg/dL, you will need to treat for low blood sugar: o Do not take insulin. o Treat a low blood sugar (less than 70 mg/dL) with  cup of clear juice (cranberry or apple), 4 glucose tablets, OR glucose gel. o Recheck blood sugar in 15 minutes after treatment (to make sure it is greater than 70 mg/dL). If your blood sugar is not greater than 70 mg/dL on recheck, call [phone] for further instructions. . Report your blood sugar to the short stay nurse when you get to Short Stay.  . If you are admitted to the hospital after surgery: o Your blood sugar will be checked by the staff and you will  probably be given insulin after surgery (instead of oral diabetes medicines) to make sure you have good blood sugar levels. o The goal for blood sugar control after surgery is 80-180 mg/dL.   WHAT DO I DO ABOUT MY DIABETES MEDICATION?   . THE DAY BEFORE SURGERY, take METFORMIN as normal       . Do not take oral diabetes medicines (pills) the morning of surgery.   Patient Signature:  Date:   Nurse Signature:  Date:   Reviewed and Endorsed by [facility] Patient Education Committee, August 2015

## 2017-03-02 NOTE — Progress Notes (Signed)
PLEASE PLACE ORDERS IN Epic. PRE-OP APPT IS 03-03-17  . THANK YOU! 

## 2017-03-03 ENCOUNTER — Encounter (HOSPITAL_COMMUNITY)
Admission: RE | Admit: 2017-03-03 | Discharge: 2017-03-03 | Disposition: A | Discharge status: Home / Self Care | Payer: 59 | Source: Ambulatory Visit | Attending: Orthopaedic Surgery | Admitting: Orthopaedic Surgery

## 2017-03-03 ENCOUNTER — Encounter (HOSPITAL_COMMUNITY): Payer: Self-pay

## 2017-03-03 ENCOUNTER — Other Ambulatory Visit: Payer: Self-pay

## 2017-03-03 DIAGNOSIS — I10 Essential (primary) hypertension: Secondary | ICD-10-CM | POA: Insufficient documentation

## 2017-03-03 DIAGNOSIS — Z01812 Encounter for preprocedural laboratory examination: Secondary | ICD-10-CM | POA: Insufficient documentation

## 2017-03-03 DIAGNOSIS — Z01810 Encounter for preprocedural cardiovascular examination: Secondary | ICD-10-CM | POA: Insufficient documentation

## 2017-03-03 DIAGNOSIS — E119 Type 2 diabetes mellitus without complications: Secondary | ICD-10-CM | POA: Insufficient documentation

## 2017-03-03 DIAGNOSIS — M1611 Unilateral primary osteoarthritis, right hip: Secondary | ICD-10-CM | POA: Insufficient documentation

## 2017-03-03 HISTORY — DX: Presence of (intrauterine) contraceptive device: Z97.5

## 2017-03-03 HISTORY — DX: Unspecified osteoarthritis, unspecified site: M19.90

## 2017-03-03 LAB — CBC
HEMATOCRIT: 41.4 % (ref 36.0–46.0)
HEMOGLOBIN: 13.5 g/dL (ref 12.0–15.0)
MCH: 27.6 pg (ref 26.0–34.0)
MCHC: 32.6 g/dL (ref 30.0–36.0)
MCV: 84.7 fL (ref 78.0–100.0)
Platelets: 276 10*3/uL (ref 150–400)
RBC: 4.89 MIL/uL (ref 3.87–5.11)
RDW: 15.1 % (ref 11.5–15.5)
WBC: 10.2 10*3/uL (ref 4.0–10.5)

## 2017-03-03 LAB — HEMOGLOBIN A1C
HEMOGLOBIN A1C: 6.5 % — AB (ref 4.8–5.6)
Mean Plasma Glucose: 139.85 mg/dL

## 2017-03-03 LAB — BASIC METABOLIC PANEL
ANION GAP: 8 (ref 5–15)
BUN: 15 mg/dL (ref 6–20)
CALCIUM: 9.3 mg/dL (ref 8.9–10.3)
CHLORIDE: 104 mmol/L (ref 101–111)
CO2: 26 mmol/L (ref 22–32)
Creatinine, Ser: 0.66 mg/dL (ref 0.44–1.00)
GFR calc Af Amer: >60 mL/min (ref >60)
GFR calc non Af Amer: >60 mL/min (ref >60)
GLUCOSE: 109 mg/dL — AB (ref 65–99)
POTASSIUM: 4 mmol/L (ref 3.5–5.1)
Sodium: 138 mmol/L (ref 135–145)

## 2017-03-03 LAB — GLUCOSE, CAPILLARY: Glucose-Capillary: 105 mg/dL — ABNORMAL HIGH (ref 65–99)

## 2017-03-03 LAB — SURGICAL PCR SCREEN
MRSA, PCR: NEGATIVE
Staphylococcus aureus: NEGATIVE

## 2017-03-03 LAB — HCG, SERUM, QUALITATIVE: Preg, Serum: NEGATIVE

## 2017-03-03 LAB — ABO/RH: ABO/RH(D): O POS

## 2017-03-04 ENCOUNTER — Other Ambulatory Visit (INDEPENDENT_AMBULATORY_CARE_PROVIDER_SITE_OTHER): Payer: Self-pay

## 2017-03-11 ENCOUNTER — Inpatient Hospital Stay (HOSPITAL_COMMUNITY): Payer: 59

## 2017-03-11 ENCOUNTER — Encounter (HOSPITAL_COMMUNITY): Payer: Self-pay | Admitting: *Deleted

## 2017-03-11 ENCOUNTER — Other Ambulatory Visit: Payer: Self-pay

## 2017-03-11 ENCOUNTER — Inpatient Hospital Stay (HOSPITAL_COMMUNITY): Payer: 59 | Admitting: Anesthesiology

## 2017-03-11 ENCOUNTER — Inpatient Hospital Stay (HOSPITAL_COMMUNITY)
Admission: RE | Admit: 2017-03-11 | Discharge: 2017-03-14 | DRG: 470 | Disposition: A | Discharge status: Home Health | Payer: 59 | Source: Ambulatory Visit | Attending: Orthopaedic Surgery | Admitting: Orthopaedic Surgery

## 2017-03-11 ENCOUNTER — Encounter (HOSPITAL_COMMUNITY)
Admission: RE | Disposition: A | Discharge status: Home Health | Payer: Self-pay | Source: Ambulatory Visit | Attending: Orthopaedic Surgery

## 2017-03-11 DIAGNOSIS — Z883 Allergy status to other anti-infective agents status: Secondary | ICD-10-CM

## 2017-03-11 DIAGNOSIS — Z975 Presence of (intrauterine) contraceptive device: Secondary | ICD-10-CM | POA: Diagnosis not present

## 2017-03-11 DIAGNOSIS — D62 Acute posthemorrhagic anemia: Secondary | ICD-10-CM | POA: Diagnosis not present

## 2017-03-11 DIAGNOSIS — Z833 Family history of diabetes mellitus: Secondary | ICD-10-CM

## 2017-03-11 DIAGNOSIS — Z6841 Body Mass Index (BMI) 40.0 and over, adult: Secondary | ICD-10-CM

## 2017-03-11 DIAGNOSIS — M25551 Pain in right hip: Secondary | ICD-10-CM | POA: Diagnosis present

## 2017-03-11 DIAGNOSIS — E119 Type 2 diabetes mellitus without complications: Secondary | ICD-10-CM | POA: Diagnosis present

## 2017-03-11 DIAGNOSIS — Z96641 Presence of right artificial hip joint: Secondary | ICD-10-CM

## 2017-03-11 DIAGNOSIS — M1611 Unilateral primary osteoarthritis, right hip: Secondary | ICD-10-CM | POA: Diagnosis present

## 2017-03-11 DIAGNOSIS — Z881 Allergy status to other antibiotic agents status: Secondary | ICD-10-CM | POA: Diagnosis not present

## 2017-03-11 DIAGNOSIS — Z8249 Family history of ischemic heart disease and other diseases of the circulatory system: Secondary | ICD-10-CM

## 2017-03-11 DIAGNOSIS — I10 Essential (primary) hypertension: Secondary | ICD-10-CM | POA: Diagnosis present

## 2017-03-11 DIAGNOSIS — Z09 Encounter for follow-up examination after completed treatment for conditions other than malignant neoplasm: Secondary | ICD-10-CM

## 2017-03-11 HISTORY — PX: TOTAL HIP ARTHROPLASTY: SHX124

## 2017-03-11 LAB — GLUCOSE, CAPILLARY
Glucose-Capillary: 110 mg/dL — ABNORMAL HIGH (ref 65–99)
Glucose-Capillary: 107 mg/dL — ABNORMAL HIGH (ref 65–99)
Glucose-Capillary: 206 mg/dL — ABNORMAL HIGH (ref 65–99)

## 2017-03-11 LAB — TYPE AND SCREEN
ABO/RH(D): O POS
ANTIBODY SCREEN: NEGATIVE

## 2017-03-11 SURGERY — ARTHROPLASTY, HIP, TOTAL, ANTERIOR APPROACH
Anesthesia: Spinal | Laterality: Right

## 2017-03-11 MED ORDER — MONTELUKAST SODIUM 10 MG PO TABS
10.0000 mg | ORAL_TABLET | Freq: Every day | ORAL | Status: DC
  Administered 2017-03-11 – 2017-03-13 (×2): 10 mg via ORAL
  Filled 2017-03-11 (×2): qty 1

## 2017-03-11 MED ORDER — METHOCARBAMOL 1000 MG/10ML IJ SOLN
500.0000 mg | Freq: Four times a day (QID) | INTRAVENOUS | Status: DC | PRN
  Administered 2017-03-11 (×2): 500 mg via INTRAVENOUS
  Filled 2017-03-11 (×2): qty 550

## 2017-03-11 MED ORDER — PROPOFOL 10 MG/ML IV BOLUS
INTRAVENOUS | Status: AC
  Filled 2017-03-11: qty 40

## 2017-03-11 MED ORDER — DOCUSATE SODIUM 100 MG PO CAPS
100.0000 mg | ORAL_CAPSULE | Freq: Two times a day (BID) | ORAL | Status: DC
  Administered 2017-03-11 – 2017-03-14 (×6): 100 mg via ORAL
  Filled 2017-03-11 (×6): qty 1

## 2017-03-11 MED ORDER — LACTATED RINGERS IV SOLN
INTRAVENOUS | Status: DC | PRN
  Administered 2017-03-11: 11:00:00 via INTRAVENOUS
  Administered 2017-03-11: 1000 mL via INTRAVENOUS
  Administered 2017-03-11: 14:00:00 via INTRAVENOUS

## 2017-03-11 MED ORDER — DEXAMETHASONE SODIUM PHOSPHATE 10 MG/ML IJ SOLN
INTRAMUSCULAR | Status: DC | PRN
  Administered 2017-03-11: 10 mg via INTRAVENOUS

## 2017-03-11 MED ORDER — MOMETASONE FURO-FORMOTEROL FUM 100-5 MCG/ACT IN AERO
2.0000 | INHALATION_SPRAY | Freq: Two times a day (BID) | RESPIRATORY_TRACT | Status: DC
  Administered 2017-03-11 – 2017-03-14 (×6): 2 via RESPIRATORY_TRACT
  Filled 2017-03-11: qty 8.8

## 2017-03-11 MED ORDER — LIP MEDEX EX OINT
TOPICAL_OINTMENT | CUTANEOUS | Status: AC
  Filled 2017-03-11: qty 7

## 2017-03-11 MED ORDER — HYDROMORPHONE HCL 1 MG/ML IJ SOLN
1.0000 mg | INTRAMUSCULAR | Status: DC | PRN
  Administered 2017-03-11 – 2017-03-13 (×6): 1 mg via INTRAVENOUS
  Filled 2017-03-11 (×6): qty 1

## 2017-03-11 MED ORDER — ONDANSETRON HCL 4 MG/2ML IJ SOLN
INTRAMUSCULAR | Status: DC | PRN
  Administered 2017-03-11: 4 mg via INTRAVENOUS

## 2017-03-11 MED ORDER — ONDANSETRON HCL 4 MG/2ML IJ SOLN
4.0000 mg | Freq: Four times a day (QID) | INTRAMUSCULAR | Status: DC | PRN
  Administered 2017-03-11: 19:00:00 4 mg via INTRAVENOUS
  Filled 2017-03-11: qty 2

## 2017-03-11 MED ORDER — POLYETHYLENE GLYCOL 3350 17 G PO PACK: 17.0000 g | PACK | Freq: Every day | ORAL | Status: DC | PRN

## 2017-03-11 MED ORDER — METFORMIN HCL 500 MG PO TABS
500.0000 mg | ORAL_TABLET | Freq: Every day | ORAL | Status: DC
  Administered 2017-03-12 – 2017-03-14 (×3): 500 mg via ORAL
  Filled 2017-03-11 (×3): qty 1

## 2017-03-11 MED ORDER — HYDROCODONE-ACETAMINOPHEN 5-325 MG PO TABS: 1.0000 | ORAL_TABLET | ORAL | Status: DC | PRN

## 2017-03-11 MED ORDER — CLINDAMYCIN PHOSPHATE 900 MG/50ML IV SOLN
900.0000 mg | INTRAVENOUS | Status: AC
  Administered 2017-03-11: 900 mg via INTRAVENOUS

## 2017-03-11 MED ORDER — HYDROMORPHONE HCL 1 MG/ML IJ SOLN
INTRAMUSCULAR | Status: AC
  Administered 2017-03-12: 1 mg via INTRAVENOUS
  Filled 2017-03-11: qty 2

## 2017-03-11 MED ORDER — PANTOPRAZOLE SODIUM 40 MG PO TBEC
40.0000 mg | DELAYED_RELEASE_TABLET | Freq: Every day | ORAL | Status: DC
  Administered 2017-03-12 – 2017-03-14 (×3): 40 mg via ORAL
  Filled 2017-03-11 (×3): qty 1

## 2017-03-11 MED ORDER — MIDAZOLAM HCL 5 MG/5ML IJ SOLN
INTRAMUSCULAR | Status: DC | PRN
  Administered 2017-03-11 (×2): 1 mg via INTRAVENOUS

## 2017-03-11 MED ORDER — BISACODYL 10 MG RE SUPP: 10.0000 mg | Freq: Every day | RECTAL | Status: DC | PRN

## 2017-03-11 MED ORDER — PHENYLEPHRINE HCL 10 MG/ML IJ SOLN
INTRAVENOUS | Status: DC | PRN
  Administered 2017-03-11: 50 ug/min via INTRAVENOUS

## 2017-03-11 MED ORDER — ROSUVASTATIN CALCIUM 10 MG PO TABS
10.0000 mg | ORAL_TABLET | Freq: Every day | ORAL | Status: DC
  Administered 2017-03-12 – 2017-03-14 (×3): 10 mg via ORAL
  Filled 2017-03-11 (×3): qty 1

## 2017-03-11 MED ORDER — METOCLOPRAMIDE HCL 5 MG/ML IJ SOLN
5.0000 mg | Freq: Three times a day (TID) | INTRAMUSCULAR | Status: DC | PRN
  Administered 2017-03-11: 5 mg via INTRAVENOUS
  Administered 2017-03-12 (×2): 10 mg via INTRAVENOUS
  Filled 2017-03-11 (×3): qty 2

## 2017-03-11 MED ORDER — SODIUM CHLORIDE 0.9 % IR SOLN
Status: DC | PRN
  Administered 2017-03-11: 1000 mL

## 2017-03-11 MED ORDER — TRANEXAMIC ACID 1000 MG/10ML IV SOLN
1000.0000 mg | INTRAVENOUS | Status: AC
  Administered 2017-03-11: 1000 mg via INTRAVENOUS
  Filled 2017-03-11: qty 1100

## 2017-03-11 MED ORDER — FENTANYL CITRATE (PF) 100 MCG/2ML IJ SOLN
INTRAMUSCULAR | Status: AC
  Filled 2017-03-11: qty 2

## 2017-03-11 MED ORDER — CLINDAMYCIN PHOSPHATE 900 MG/50ML IV SOLN
INTRAVENOUS | Status: AC
  Filled 2017-03-11: qty 50

## 2017-03-11 MED ORDER — METOCLOPRAMIDE HCL 5 MG PO TABS: 5.0000 mg | ORAL_TABLET | Freq: Three times a day (TID) | ORAL | Status: DC | PRN

## 2017-03-11 MED ORDER — LACTATED RINGERS IV SOLN: INTRAVENOUS | Status: DC

## 2017-03-11 MED ORDER — EPHEDRINE 5 MG/ML INJ
INTRAVENOUS | Status: AC
  Filled 2017-03-11: qty 10

## 2017-03-11 MED ORDER — BUPIVACAINE IN DEXTROSE 0.75-8.25 % IT SOLN
INTRATHECAL | Status: DC | PRN
  Administered 2017-03-11: 2 mL via INTRATHECAL

## 2017-03-11 MED ORDER — SODIUM CHLORIDE 0.9 % IV SOLN
INTRAVENOUS | Status: DC
  Administered 2017-03-11: 75 mL/h via INTRAVENOUS
  Administered 2017-03-12: 07:00:00 via INTRAVENOUS

## 2017-03-11 MED ORDER — PROPOFOL 500 MG/50ML IV EMUL
INTRAVENOUS | Status: DC | PRN
  Administered 2017-03-11: 75 ug/kg/min via INTRAVENOUS

## 2017-03-11 MED ORDER — METHOCARBAMOL 500 MG PO TABS
500.0000 mg | ORAL_TABLET | Freq: Four times a day (QID) | ORAL | Status: DC | PRN
  Administered 2017-03-12 – 2017-03-14 (×4): 500 mg via ORAL
  Filled 2017-03-11 (×5): qty 1

## 2017-03-11 MED ORDER — MENTHOL 3 MG MT LOZG: 1.0000 | LOZENGE | OROMUCOSAL | Status: DC | PRN

## 2017-03-11 MED ORDER — AMLODIPINE BESYLATE 10 MG PO TABS
10.0000 mg | ORAL_TABLET | Freq: Every morning | ORAL | Status: DC
  Administered 2017-03-12 – 2017-03-14 (×3): 10 mg via ORAL
  Filled 2017-03-11 (×3): qty 1

## 2017-03-11 MED ORDER — OXYCODONE HCL 5 MG PO TABS
10.0000 mg | ORAL_TABLET | ORAL | Status: DC | PRN
  Administered 2017-03-11 – 2017-03-14 (×18): 10 mg via ORAL
  Filled 2017-03-11 (×19): qty 2

## 2017-03-11 MED ORDER — ACETAMINOPHEN 325 MG PO TABS
650.0000 mg | ORAL_TABLET | ORAL | Status: DC | PRN
  Administered 2017-03-12 (×4): 650 mg via ORAL
  Filled 2017-03-11 (×5): qty 2

## 2017-03-11 MED ORDER — MIDAZOLAM HCL 2 MG/2ML IJ SOLN
INTRAMUSCULAR | Status: AC
  Filled 2017-03-11: qty 2

## 2017-03-11 MED ORDER — ALUM & MAG HYDROXIDE-SIMETH 200-200-20 MG/5ML PO SUSP: 30.0000 mL | ORAL | Status: DC | PRN

## 2017-03-11 MED ORDER — ONDANSETRON HCL 4 MG PO TABS: 4.0000 mg | ORAL_TABLET | Freq: Four times a day (QID) | ORAL | Status: DC | PRN

## 2017-03-11 MED ORDER — LEVONORGESTREL 20 MCG/24HR IU IUD: 1.0000 | INTRAUTERINE_SYSTEM | Freq: Once | INTRAUTERINE | Status: DC

## 2017-03-11 MED ORDER — STERILE WATER FOR IRRIGATION IR SOLN
Status: DC | PRN
  Administered 2017-03-11: 2000 mL

## 2017-03-11 MED ORDER — DIPHENHYDRAMINE HCL 12.5 MG/5ML PO ELIX: 12.5000 mg | ORAL_SOLUTION | ORAL | Status: DC | PRN

## 2017-03-11 MED ORDER — CLINDAMYCIN PHOSPHATE 600 MG/50ML IV SOLN
600.0000 mg | Freq: Four times a day (QID) | INTRAVENOUS | Status: AC
  Administered 2017-03-11 – 2017-03-12 (×2): 600 mg via INTRAVENOUS
  Filled 2017-03-11 (×2): qty 50

## 2017-03-11 MED ORDER — PHENYLEPHRINE 40 MCG/ML (10ML) SYRINGE FOR IV PUSH (FOR BLOOD PRESSURE SUPPORT)
PREFILLED_SYRINGE | INTRAVENOUS | Status: DC | PRN
  Administered 2017-03-11: 80 ug via INTRAVENOUS
  Administered 2017-03-11 (×2): 120 ug via INTRAVENOUS
  Administered 2017-03-11: 80 ug via INTRAVENOUS

## 2017-03-11 MED ORDER — LIP MEDEX EX OINT
TOPICAL_OINTMENT | CUTANEOUS | Status: AC
  Administered 2017-03-12: 02:00:00
  Filled 2017-03-11: qty 7

## 2017-03-11 MED ORDER — ALBUTEROL SULFATE (2.5 MG/3ML) 0.083% IN NEBU: 3.0000 mL | INHALATION_SOLUTION | RESPIRATORY_TRACT | Status: DC | PRN

## 2017-03-11 MED ORDER — ASPIRIN 81 MG PO CHEW
81.0000 mg | CHEWABLE_TABLET | Freq: Two times a day (BID) | ORAL | Status: DC
  Administered 2017-03-11 – 2017-03-14 (×6): 81 mg via ORAL
  Filled 2017-03-11 (×6): qty 1

## 2017-03-11 MED ORDER — LOSARTAN POTASSIUM 25 MG PO TABS
25.0000 mg | ORAL_TABLET | Freq: Every day | ORAL | Status: DC
Start: 2017-03-12 — End: 2017-03-14
  Administered 2017-03-12 – 2017-03-14 (×3): 25 mg via ORAL
  Filled 2017-03-11 (×3): qty 1

## 2017-03-11 MED ORDER — FENTANYL CITRATE (PF) 100 MCG/2ML IJ SOLN
INTRAMUSCULAR | Status: DC | PRN
  Administered 2017-03-11 (×2): 50 ug via INTRAVENOUS

## 2017-03-11 MED ORDER — ACETAMINOPHEN 650 MG RE SUPP: 650.0000 mg | RECTAL | Status: DC | PRN

## 2017-03-11 MED ORDER — PHENOL 1.4 % MT LIQD: 1.0000 | OROMUCOSAL | Status: DC | PRN

## 2017-03-11 MED ORDER — HYDROMORPHONE HCL 1 MG/ML IJ SOLN
0.2500 mg | INTRAMUSCULAR | Status: AC | PRN
  Administered 2017-03-11 (×8): 0.5 mg via INTRAVENOUS

## 2017-03-11 SURGICAL SUPPLY — 36 items
APL SKNCLS STERI-STRIP NONHPOA (GAUZE/BANDAGES/DRESSINGS)
BAG SPEC THK2 15X12 ZIP CLS (MISCELLANEOUS)
BAG ZIPLOCK 12X15 (MISCELLANEOUS) IMPLANT
BENZOIN TINCTURE PRP APPL 2/3 (GAUZE/BANDAGES/DRESSINGS) IMPLANT
BLADE SAW SGTL 18X1.27X75 (BLADE) ×2 IMPLANT
BLADE SAW SGTL 18X1.27X75MM (BLADE) ×1
CAPT HIP TOTAL 2 ×2 IMPLANT
CLOSURE WOUND 1/2 X4 (GAUZE/BANDAGES/DRESSINGS)
COVER PERINEAL POST (MISCELLANEOUS) ×3 IMPLANT
COVER SURGICAL LIGHT HANDLE (MISCELLANEOUS) ×3 IMPLANT
DRAPE STERI IOBAN 125X83 (DRAPES) ×3 IMPLANT
DRAPE U-SHAPE 47X51 STRL (DRAPES) ×6 IMPLANT
DRSG AQUACEL AG ADV 3.5X10 (GAUZE/BANDAGES/DRESSINGS) ×3 IMPLANT
DURAPREP 26ML APPLICATOR (WOUND CARE) ×3 IMPLANT
ELECT REM PT RETURN 15FT ADLT (MISCELLANEOUS) ×3 IMPLANT
GAUZE XEROFORM 1X8 LF (GAUZE/BANDAGES/DRESSINGS) IMPLANT
GAUZE XEROFORM 2X2 STRL (GAUZE/BANDAGES/DRESSINGS) ×2 IMPLANT
GLOVE BIO SURGEON STRL SZ7.5 (GLOVE) ×3 IMPLANT
GLOVE BIOGEL PI IND STRL 8 (GLOVE) ×2 IMPLANT
GLOVE BIOGEL PI INDICATOR 8 (GLOVE) ×4
GLOVE ECLIPSE 8.0 STRL XLNG CF (GLOVE) ×3 IMPLANT
GOWN STRL REUS W/TWL XL LVL3 (GOWN DISPOSABLE) ×6 IMPLANT
HANDPIECE INTERPULSE COAX TIP (DISPOSABLE) ×3
HOLDER FOLEY CATH W/STRAP (MISCELLANEOUS) ×3 IMPLANT
PACK ANTERIOR HIP CUSTOM (KITS) ×3 IMPLANT
SET HNDPC FAN SPRY TIP SCT (DISPOSABLE) ×1 IMPLANT
STAPLER VISISTAT 35W (STAPLE) ×2 IMPLANT
STRIP CLOSURE SKIN 1/2X4 (GAUZE/BANDAGES/DRESSINGS) IMPLANT
SUT ETHIBOND NAB CT1 #1 30IN (SUTURE) ×3 IMPLANT
SUT MNCRL AB 4-0 PS2 18 (SUTURE) IMPLANT
SUT VIC AB 0 CT1 36 (SUTURE) ×3 IMPLANT
SUT VIC AB 1 CT1 36 (SUTURE) ×3 IMPLANT
SUT VIC AB 2-0 CT1 27 (SUTURE) ×6
SUT VIC AB 2-0 CT1 TAPERPNT 27 (SUTURE) ×2 IMPLANT
TRAY FOLEY W/METER SILVER 16FR (SET/KITS/TRAYS/PACK) ×3 IMPLANT
YANKAUER SUCT BULB TIP 10FT TU (MISCELLANEOUS) ×3 IMPLANT

## 2017-03-11 NOTE — Anesthesia Procedure Notes (Signed)
Spinal  Patient location during procedure: OR Staffing Anesthesiologist: Sharyl Panchal, MD Spinal Block Patient position: sitting Prep: DuraPrep Patient monitoring: heart rate, blood pressure and continuous pulse ox Approach: right paramedian Location: L3-4 Injection technique: single-shot Needle Needle type: Sprotte  Needle gauge: 24 G Needle length: 9 cm Assessment Sensory level: T4 Additional Notes Spinal Dosage in OR  .75% Bupivicaine ml       1.9     

## 2017-03-11 NOTE — Anesthesia Preprocedure Evaluation (Signed)
Anesthesia Evaluation  Patient identified by MRN, date of birth, ID band Patient awake    Reviewed: Allergy & Precautions, H&P , Patient's Chart, lab work & pertinent test results  Airway Mallampati: II  TM Distance: >3 FB Neck ROM: full    Dental no notable dental hx.    Pulmonary asthma ,    Pulmonary exam normal breath sounds clear to auscultation       Cardiovascular Exercise Tolerance: Good hypertension,  Rhythm:regular Rate:Normal     Neuro/Psych    GI/Hepatic   Endo/Other  diabetes  Renal/GU      Musculoskeletal   Abdominal   Peds  Hematology   Anesthesia Other Findings   Reproductive/Obstetrics                             Anesthesia Physical Anesthesia Plan  ASA: III  Anesthesia Plan: Spinal   Post-op Pain Management:    Induction:   PONV Risk Score and Plan: 2 and Dexamethasone, Ondansetron and Treatment may vary due to age or medical condition  Airway Management Planned:   Additional Equipment:   Intra-op Plan:   Post-operative Plan:   Informed Consent: I have reviewed the patients History and Physical, chart, labs and discussed the procedure including the risks, benefits and alternatives for the proposed anesthesia with the patient or authorized representative who has indicated his/her understanding and acceptance.     Plan Discussed with:   Anesthesia Plan Comments: (  )        Anesthesia Quick Evaluation

## 2017-03-11 NOTE — H&P (Signed)
TOTAL HIP ADMISSION H&P  Patient is admitted for right total hip arthroplasty.  Subjective:  Chief Complaint: right hip pain  HPI: Burley SaverLeslie R Gesner, 46 y.o. female, has a history of pain and functional disability in the right hip(s) due to arthritis and patient has failed non-surgical conservative treatments for greater than 12 weeks to include NSAID's and/or analgesics, corticosteriod injections, flexibility and strengthening excercises, use of assistive devices, weight reduction as appropriate and activity modification.  Onset of symptoms was gradual starting 3 years ago with gradually worsening course since that time.The patient noted no past surgery on the right hip(s).  Patient currently rates pain in the right hip at 10 out of 10 with activity. Patient has night pain, worsening of pain with activity and weight bearing, pain that interfers with activities of daily living and pain with passive range of motion. Patient has evidence of subchondral sclerosis, periarticular osteophytes and joint space narrowing by imaging studies. This condition presents safety issues increasing the risk of falls.  There is no current active infection.  Patient Active Problem List   Diagnosis Date Noted  . Unilateral primary osteoarthritis, right hip 01/27/2017   Past Medical History:  Diagnosis Date  . Arthritis   . Asthma   . Diabetes mellitus without complication (HCC)    type   . Hypertension   . Interstitial cystitis   . IUD (intrauterine device) in place placed 6 weeks ago  . Migraines     Past Surgical History:  Procedure Laterality Date  . DILATION AND EVACUATION  09/30/2011   Procedure: DILATATION AND EVACUATION;  Surgeon: Marcell AndreaJames E Tomblin II, MD;  Location: [facility];  Service: Gynecology;  Laterality: N/A;  . left knee surgery      arthroscopy     Current Facility-Administered Medications  Medication Dose Route Frequency Provider Last Rate Last Dose  . clindamycin (CLEOCIN) 900 MG/50ML  IVPB           . clindamycin (CLEOCIN) IVPB 900 mg  900 mg Intravenous On Call to OR Kathryne HitchBlackman, Tahani Potier Y, MD      . tranexamic acid (CYKLOKAPRON) 1,000 mg in sodium chloride 0.9 % 100 mL IVPB  1,000 mg Intravenous To OR Kathryne HitchBlackman, Deryn Massengale Y, MD       Facility-Administered Medications Ordered in Other Encounters  Medication Dose Route Frequency Provider Last Rate Last Dose  . lactated ringers infusion   Intravenous Continuous PRN Key, Kristopher, CRNA       Allergies  Allergen Reactions  . Amoxicillin Hives, Swelling and Other (See Comments)    Has patient had a PCN reaction causing immediate rash, facial/tongue/throat swelling, SOB or lightheadedness with hypotension: No Has patient had a PCN reaction causing severe rash involving mucus membranes or skin necrosis: No Has patient had a PCN reaction that required hospitalization: Yes Has patient had a PCN reaction occurring within the last 10 years: No  If all of the above answers are "NO", then may proceed with Cephalosporin use.   . Sulfamethoxazole-Trimethoprim Rash  . Bactrim Rash    Social History   Tobacco Use  . Smoking status: Never Smoker  . Smokeless tobacco: Never Used  Substance Use Topics  . Alcohol use: No    Family History  Problem Relation Age of Onset  . Diabetes Mother   . Heart disease Mother   . Diabetes Father   . Eczema Sister   . Asthma Brother   . Asthma Son   . Eczema Daughter   . Allergic rhinitis  Neg Hx   . Angioedema Neg Hx   . Immunodeficiency Neg Hx   . Urticaria Neg Hx      Review of Systems  Musculoskeletal: Positive for joint pain.  All other systems reviewed and are negative.   Objective:  Physical Exam  Constitutional: She is oriented to person, place, and time. She appears well-developed and well-nourished.  HENT:  Head: Normocephalic and atraumatic.  Eyes: EOM are normal. Pupils are equal, round, and reactive to light.  Neck: Normal range of motion. Neck supple.   Cardiovascular: Normal rate and regular rhythm.  Respiratory: Effort normal and breath sounds normal.  GI: Soft. Bowel sounds are normal.  Musculoskeletal:       Right hip: She exhibits decreased range of motion, decreased strength, tenderness and bony tenderness.  Neurological: She is alert and oriented to person, place, and time.  Skin: Skin is warm and dry.  Psychiatric: She has a normal mood and affect.    Vital signs in last 24 hours: Temp:  [98.5 F (36.9 C)] 98.5 F (36.9 C) (12/28 1047) Pulse Rate:  [93] 93 (12/28 1047) Resp:  [18] 18 (12/28 1047) BP: (139)/(91) 139/91 (12/28 1047) SpO2:  [100 %] 100 % (12/28 1047) Weight:  [276 lb (125.2 kg)] 276 lb (125.2 kg) (12/28 1047)  Labs:   Estimated body mass index is 45.93 kg/m as calculated from the following:   Height as of this encounter: 5\' 5"  (1.651 m).   Weight as of this encounter: 276 lb (125.2 kg).   Imaging Review Plain radiographs demonstrate severe degenerative joint disease of the right hip(s). The bone quality appears to be excellent for age and reported activity level.  Assessment/Plan:  End stage arthritis, right hip(s)  The patient history, physical examination, clinical judgement of the provider and imaging studies are consistent with end stage degenerative joint disease of the right hip(s) and total hip arthroplasty is deemed medically necessary. The treatment options including medical management, injection therapy, arthroscopy and arthroplasty were discussed at length. The risks and benefits of total hip arthroplasty were presented and reviewed. The risks due to aseptic loosening, infection, stiffness, dislocation/subluxation,  thromboembolic complications and other imponderables were discussed.  The patient acknowledged the explanation, agreed to proceed with the plan and consent was signed. Patient is being admitted for inpatient treatment for surgery, pain control, PT, OT, prophylactic antibiotics, VTE  prophylaxis, progressive ambulation and ADL's and discharge planning.The patient is planning to be discharged home with home health services

## 2017-03-11 NOTE — Transfer of Care (Signed)
Immediate Anesthesia Transfer of Care Note  [name]  Procedure(s) Performed: RIGHT TOTAL HIP ARTHROPLASTY ANTERIOR APPROACH (Right )  Patient Location: PACU  Anesthesia Type:MAC and Spinal  Level of Consciousness: drowsy and patient cooperative  Airway & Oxygen Therapy: Patient Spontanous Breathing and Patient connected to face mask oxygen  Post-op Assessment: Report given to RN and Post -op Vital signs reviewed and stable  Post vital signs: Reviewed and stable  Last Vitals:  Vitals:   03/11/17 1047  BP: (!) 139/91  Pulse: 93  Resp: 18  Temp: 36.9 C  SpO2: 100%    Last Pain:  Vitals:   03/11/17 1047  TempSrc: Oral  PainSc: 8       Patients Stated Pain Goal: 6 (56/31/49 7026)  Complications: No apparent anesthesia complications

## 2017-03-11 NOTE — Brief Op Note (Signed)
03/11/2017  4:02 PM  PATIENT:  Emily Hester  46 y.o. female  PRE-OPERATIVE DIAGNOSIS:  Right Hip Osteoarthitis   POST-OPERATIVE DIAGNOSIS:   Right Hip Osteoarthitis  PROCEDURE:  Procedure(s): RIGHT TOTAL HIP ARTHROPLASTY ANTERIOR APPROACH (Right)  SURGEON:  Surgeon(s) and Role:    Kathryne Hitch* Corine Solorio Y, MD - Primary  ANESTHESIA:   spinal  EBL:  400 mL   COUNTS:  YES  DICTATION: .Other Dictation: Dictation Number [id number]  PLAN OF CARE: Admit to inpatient   PATIENT DISPOSITION:  PACU - hemodynamically stable.   Delay start of Pharmacological VTE agent (>24hrs) due to surgical blood loss or risk of bleeding: no

## 2017-03-11 NOTE — Anesthesia Procedure Notes (Signed)
Procedure Name: MAC Date/Time: 03/11/2017 1:48 PM Performed by: West Pugh, CRNA Pre-anesthesia Checklist: Patient identified, Emergency Drugs available, Suction available, Patient being monitored and Timeout performed Patient Re-evaluated:Patient Re-evaluated prior to induction Oxygen Delivery Method: Non-rebreather mask Placement Confirmation: positive ETCO2 and CO2 detector Dental Injury: Teeth and Oropharynx as per pre-operative assessment

## 2017-03-12 LAB — BASIC METABOLIC PANEL
Anion gap: 6 (ref 5–15)
BUN: 11 mg/dL (ref 6–20)
CO2: 25 mmol/L (ref 22–32)
CREATININE: 0.72 mg/dL (ref 0.44–1.00)
Calcium: 8.3 mg/dL — ABNORMAL LOW (ref 8.9–10.3)
Chloride: 104 mmol/L (ref 101–111)
GFR calc Af Amer: >60 mL/min (ref >60)
GLUCOSE: 169 mg/dL — AB (ref 65–99)
POTASSIUM: 3.9 mmol/L (ref 3.5–5.1)
SODIUM: 135 mmol/L (ref 135–145)

## 2017-03-12 LAB — CBC
HCT: 34.1 % — ABNORMAL LOW (ref 36.0–46.0)
Hemoglobin: 11 g/dL — ABNORMAL LOW (ref 12.0–15.0)
MCH: 27 pg (ref 26.0–34.0)
MCHC: 32.3 g/dL (ref 30.0–36.0)
MCV: 83.8 fL (ref 78.0–100.0)
PLATELETS: 249 10*3/uL (ref 150–400)
RBC: 4.07 MIL/uL (ref 3.87–5.11)
RDW: 15.2 % (ref 11.5–15.5)
WBC: 12.6 10*3/uL — ABNORMAL HIGH (ref 4.0–10.5)

## 2017-03-12 LAB — GLUCOSE, CAPILLARY
GLUCOSE-CAPILLARY: 155 mg/dL — AB (ref 65–99)
GLUCOSE-CAPILLARY: 125 mg/dL — AB (ref 65–99)
Glucose-Capillary: 136 mg/dL — ABNORMAL HIGH (ref 65–99)
Glucose-Capillary: 116 mg/dL — ABNORMAL HIGH (ref 65–99)

## 2017-03-12 MED ORDER — ASPIRIN 81 MG PO CHEW: 81.0000 mg | CHEWABLE_TABLET | Freq: Two times a day (BID) | ORAL | 0 refills | Status: DC

## 2017-03-12 MED ORDER — METHOCARBAMOL 500 MG PO TABS: 500.0000 mg | ORAL_TABLET | Freq: Four times a day (QID) | ORAL | 0 refills | Status: DC | PRN

## 2017-03-12 MED ORDER — OXYCODONE-ACETAMINOPHEN 5-325 MG PO TABS: 1.0000 | ORAL_TABLET | ORAL | 0 refills | Status: DC | PRN

## 2017-03-12 NOTE — Evaluation (Signed)
Physical Therapy Evaluation Patient Details Name: Emily Hester MRN: [name] DOB: 03/11/1971 Today's Date: 03/12/2017   History of Present Illness  s/p R THA  Clinical Impression  Pt is s/p THA resulting in the deficits listed below (see PT Problem List).  Pt will benefit from skilled PT to increase their independence and safety with mobility to allow discharge to the venue listed below.  amb 5180' with RW and min/guard assist, should continue to progress well;      Follow Up Recommendations Home health PT    Equipment Recommendations  Rolling walker with 5" wheels(may borrow (?))    Recommendations for Other Services       Precautions / Restrictions Precautions Precautions: Fall Restrictions Weight Bearing Restrictions: No      Mobility  Bed Mobility Overal bed mobility: Needs Assistance Bed Mobility: Sit to Supine       Sit to supine: Min assist   General bed mobility comments: assist with RLE  Transfers Overall transfer level: Needs assistance Equipment used: Rolling walker (2 wheeled) Transfers: Sit to/from Stand Sit to Stand: Min assist         General transfer comment: assist to rise and steady. Cues for UE/LE placement  Ambulation/Gait Ambulation/Gait assistance: Min guard;Min assist Ambulation Distance (Feet): 80 Feet Assistive device: Rolling walker (2 wheeled) Gait Pattern/deviations: Step-to pattern;Antalgic     General Gait Details: cues for trunk extension, step length and sequence  Stairs            Wheelchair Mobility    Modified Rankin (Stroke Patients Only)       Balance                                             Pertinent Vitals/Pain Pain Assessment: 0-10 Pain Score: 9  Pain Location: right hip Pain Descriptors / Indicators: Burning;Discomfort;Sore Pain Intervention(s): Limited activity within patient's tolerance;Monitored during session;Premedicated before session;Repositioned;Ice applied     Home Living Family/patient expects to be discharged to:: Private residence Living Arrangements: Spouse/significant other Available Help at Discharge: Family Type of Home: House Home Access: Stairs to enter Entrance Stairs-Rails: Right Entrance Stairs-Number of Steps: 4 Home Layout: One level Home Equipment: Environmental consultantWalker - 2 wheels Additional Comments: sink/counter next to toilet    Prior Function Level of Independence: Independent               Hand Dominance        Extremity/Trunk Assessment   Upper Extremity Assessment Upper Extremity Assessment: Overall WFL for tasks assessed    Lower Extremity Assessment Lower Extremity Assessment: RLE deficits/detail RLE Deficits / Details: ankle WFL; knee and hip AROM limited by post op pain and weakness       Communication   Communication: No difficulties  Cognition Arousal/Alertness: Awake/alert Behavior During Therapy: WFL for tasks assessed/performed Overall Cognitive Status: Within Functional Limits for tasks assessed                                        General Comments      Exercises Total Joint Exercises Ankle Circles/Pumps: AROM;Both;10 reps   Assessment/Plan    PT Assessment Patient needs continued PT services  PT Problem List Decreased strength;Decreased range of motion;Decreased activity tolerance;Decreased mobility;Pain;Decreased knowledge of use of DME  PT Treatment Interventions DME instruction;Gait training;Functional mobility training;Therapeutic exercise;Patient/family education;Therapeutic activities;Stair training    PT Goals (Current goals can be found in the Care Plan section)  Acute Rehab PT Goals Patient Stated Goal: home PT Goal Formulation: With patient Time For Goal Achievement: 03/19/17 Potential to Achieve Goals: Good    Frequency 7X/week   Barriers to discharge        Co-evaluation               AM-PAC PT "6 Clicks" Daily Activity  Outcome  Measure Difficulty turning over in bed (including adjusting bedclothes, sheets and blankets)?: Unable Difficulty moving from lying on back to sitting on the side of the bed? : Unable Difficulty sitting down on and standing up from a chair with arms (e.g., wheelchair, bedside commode, etc,.)?: Unable Help needed moving to and from a bed to chair (including a wheelchair)?: A Little Help needed walking in hospital room?: A Little Help needed climbing 3-5 steps with a railing? : A Lot 6 Click Score: 11    End of Session Equipment Utilized During Treatment: Gait belt Activity Tolerance: Patient tolerated treatment well Patient left: with call bell/phone within reach;in bed;with bed alarm set;with family/visitor present Nurse Communication: Mobility status PT Visit Diagnosis: Difficulty in walking, not elsewhere classified (R26.2)    Time: [id number] PT Time Calculation (min) (ACUTE ONLY): 17 min   Charges:   PT Evaluation $PT Eval Low Complexity: 1 Low     PT G CodesDrucilla Chalet:        Jaydien Panepinto, PT Pager: [phone] 03/12/2017   Emily Hester,Emily Hester 03/12/2017, 1:04 PM

## 2017-03-12 NOTE — Progress Notes (Signed)
Subjective: 1 Day Post-Op Procedure(s) (LRB): RIGHT TOTAL HIP ARTHROPLASTY ANTERIOR APPROACH (Right) Patient reports pain as severe.    Objective: Vital signs in last 24 hours: Temp:  [97.6 F (36.4 C)-98.5 F (36.9 C)] 98.1 F (36.7 C) (12/29 0528) Pulse Rate:  [82-103] 93 (12/29 0528) Resp:  [12-18] 16 (12/29 0528) BP: (115-140)/(71-91) 115/75 (12/29 0528) SpO2:  [94 %-100 %] 94 % (12/29 0815) Weight:  [276 lb (125.2 kg)] 276 lb (125.2 kg) (12/28 1720)  Intake/Output from previous day: 12/28 0701 - 12/29 0700 In: 3695 [P.O.:240; I.V.:3350; IV Piggyback:105] Out: 1400 [Urine:1000; Blood:400] Intake/Output this shift: Total I/O In: 120 [P.O.:120] Out: -   Recent Labs    03/12/17 0537  HGB 11.0*   Recent Labs    03/12/17 0537  WBC 12.6*  RBC 4.07  HCT 34.1*  PLT 249   Recent Labs    03/12/17 0537  NA 135  K 3.9  CL 104  CO2 25  BUN 11  CREATININE 0.72  GLUCOSE 169*  CALCIUM 8.3*   No results for input(s): LABPT, INR in the last 72 hours.  Sensation intact distally Intact pulses distally Dorsiflexion/Plantar flexion intact Incision: scant drainage  Assessment/Plan: 1 Day Post-Op Procedure(s) (LRB): RIGHT TOTAL HIP ARTHROPLASTY ANTERIOR APPROACH (Right) Up with therapy Discharge home with home health likely Monday due to limited mobility.  Kathryne HitchChristopher Y Nolberto Cheuvront 03/12/2017, 8:33 AM

## 2017-03-12 NOTE — Op Note (Signed)
NAME:  Emily Hester, Devan          ACCOUNT NO.:  [id number]  MEDICAL RECORD NO.:  [id number]  LOCATION:  [facility]                         [facility]  PHYSICIAN:  Vanita PandaChristopher Y. Magnus IvanBlackman, M.D.DATE OF BIRTH:  [birth year]  DATE OF PROCEDURE:  03/11/2017 DATE OF DISCHARGE:                              OPERATIVE REPORT   PREOPERATIVE DIAGNOSIS:  Primary osteoarthritis and degenerative joint disease, right hip.  POSTOPERATIVE DIAGNOSIS:  Primary osteoarthritis and degenerative joint disease, right hip.  PROCEDURE:  Right total hip arthroplasty through direct anterior approach.  IMPLANTS:  DePuy Sector Gription acetabular component size 50, size 32 +4 polyethylene liner, size 9 Corail femoral component with varus offset, size 32 +5 ceramic hip ball.  SURGEON:  Vanita PandaChristopher Y. Magnus IvanBlackman, M.D.  [facility].  ANTIBIOTICS:  IV clindamycin 900 mg.  BLOOD LOSS:  450 mL.  ANESTHESIA:  Spinal.  COMPLICATIONS:  None.  INDICATIONS:  [name] is a significantly morbidly obese 46 year old female with debilitating arthritis involving her right hip.  She has tried and failed all forms of conservative treatment including multiple injections.  She has worked on weight loss and activity modification. She did see another orthopedic surgeon in town, who sent her to me for definitive treatment because he did not feel comfortable with this case, he wanted me to help her and asked if I would definitely help this nice lady out.  She understands this case is quite difficult given her obesity and that the risks are high for acute blood loss anemia, nerve and vessel injury, fracture, infection, DVT, and even dislocation given her weight.  She understands our goals are to decrease pain, improve mobility, and overall improve quality of life.  PROCEDURE DESCRIPTION:  After informed consent was obtained, appropriate right hip was marked.  She was brought to the operating  room, where spinal anesthesia was obtained while she was on her stretcher.  She was laid in a supine position.  A Foley catheter was placed and both feet had traction boots applied to them.  Next, she was placed supine on the Hana fracture table with the perineal post in place and both legs in inline skeletal traction devices, but no traction applied.  Her right operative hip was prepped and draped with DuraPrep and sterile drapes. Time-out was called and she was identified as correct patient and correct right hip.  We then made an incision just inferior and posterior to the anterior superior iliac spine and carried this obliquely down the leg.  We dissected down the tensor fascia lata muscle.  The tensor fascia was then divided longitudinally to proceed with a direct anterior approach to the hip.  We identified and cauterized the circumflex vessels and identified the hip capsule.  We opened up the hip capsule in an L-type format, finding a moderate joint effusion, significant arthritis, and a small femoral head.  We placed Cobra retractors on the medial and lateral femoral neck and made our femoral neck cut with an oscillating saw proximal to the lesser trochanter and completed this with an osteotome.  We placed a corkscrew guide in the femoral head and removed the femoral head in its entirety and found it to be devoid  of cartilage.  We then placed a bent Hohmann over the medial acetabular rim and removed remnants of the acetabular labrum.  I then began reaming from a size 43 reamer, going up in stepwise increments up to a size 50 with all reamers under direct visualization, the last reamer under direct fluoroscopy, so we could obtain our depth of reaming, our inclination, and anteversion.  I then placed a real DePuy Sector Gription acetabular component size 50 without any difficulty and a 32 +4 polyethylene liner for that size acetabular component.  Attention was then turned to the  femur.  With the leg externally rotated to 120 degrees extended and adducted, we were able to place a Mueller retractor medially and Hohmann retractor behind the greater trochanter.  We then used a box cutting osteotome to enter the canal and a rongeur to lateralize.  We broached from a size 8 broach going only up to a size 9 given the tightness of her bone in femoral canal.  We trialed a varus offset femoral neck based off her anatomy and a 32 +1 hip ball and brought the leg back over and up with traction and internal rotation, reducing the pelvis, and I felt like we needed just a little bit more offset and leg length.  We dislocated the hip and removed the trial components.  We then placed the real Corail femoral component size 9 with varus offset, the real 32 +5 ceramic hip ball and reduced this in the acetabulum.  We did appreciate stability, leg length, and offset under range of motion and fluoroscopy.  We then irrigated the soft tissue with normal saline solution using pulsatile lavage.  I was able to close the joint capsule with interrupted #1 Ethibond suture followed by running #1 Vicryl in the tensor fascia, 0 Vicryl in the deep tissue, 2-0 Vicryl in the subcutaneous tissue, interrupted staples on the skin. Xeroform and Aquacel dressings were applied.  She was taken off the Hana table, taken to the recovery room in stable condition.  All final counts were correct.  There were no complications noted.     Vanita Pandahristopher Y. Magnus IvanBlackman, M.D.     CYB/MEDQ  D:  03/11/2017  T:  03/12/2017  Job:  [id number]

## 2017-03-12 NOTE — Progress Notes (Signed)
Physical Therapy Treatment Patient Details Name: Emily SaverLeslie R Wiebelhaus MRN: [id number] DOB: 09/21/1970 Today's Date: 03/12/2017    History of Present Illness s/p R THA    PT Comments    Pt progressing well; pt is able to work within limits of pain, RN addressing pain issues--incr amb distance this pm  Follow Up Recommendations  Home health PT     Equipment Recommendations  Rolling walker with 5" wheels;3in1 (PT)    Recommendations for Other Services       Precautions / Restrictions Precautions Precautions: Fall Restrictions Weight Bearing Restrictions: No Other Position/Activity Restrictions: WBAT    Mobility  Bed Mobility Overal bed mobility: Needs Assistance Bed Mobility: Sit to Supine;Supine to Sit     Supine to sit: Min assist;HOB elevated Sit to supine: Min assist   General bed mobility comments: assist with RLE  Transfers Overall transfer level: Needs assistance Equipment used: Rolling walker (2 wheeled) Transfers: Sit to/from Stand Sit to Stand: Min guard         General transfer comment:  Cues for UE/LE placement  Ambulation/Gait Ambulation/Gait assistance: Min guard;Supervision Ambulation Distance (Feet): 70 Feet Assistive device: Rolling walker (2 wheeled) Gait Pattern/deviations: Step-to pattern;Antalgic     General Gait Details: cues for trunk extension, step length and sequence   Stairs            Wheelchair Mobility    Modified Rankin (Stroke Patients Only)       Balance                                            Cognition Arousal/Alertness: Awake/alert Behavior During Therapy: WFL for tasks assessed/performed Overall Cognitive Status: Within Functional Limits for tasks assessed                                        Exercises Total Joint Exercises Ankle Circles/Pumps: AROM;Both;15 reps Quad Sets: AROM;Strengthening;Both;10 reps Heel Slides: AAROM;Right;10 reps Hip  ABduction/ADduction: AAROM;Right;10 reps    General Comments        Pertinent Vitals/Pain Pain Assessment: 0-10 Pain Score: 7  Pain Location: right hip Pain Descriptors / Indicators: Burning;Discomfort;Sore Pain Intervention(s): Limited activity within patient's tolerance;Monitored during session;Premedicated before session    Home Living                      Prior Function            PT Goals (current goals can now be found in the care plan section) Acute Rehab PT Goals Patient Stated Goal: home PT Goal Formulation: With patient Time For Goal Achievement: 03/19/17 Potential to Achieve Goals: Good Progress towards PT goals: Progressing toward goals    Frequency    7X/week      PT Plan Current plan remains appropriate    Co-evaluation              AM-PAC PT "6 Clicks" Daily Activity  Outcome Measure  Difficulty turning over in bed (including adjusting bedclothes, sheets and blankets)?: Unable Difficulty moving from lying on back to sitting on the side of the bed? : Unable Difficulty sitting down on and standing up from a chair with arms (e.g., wheelchair, bedside commode, etc,.)?: Unable Help needed moving to and from a bed to chair (including a  wheelchair)?: A Little Help needed walking in hospital room?: A Little Help needed climbing 3-5 steps with a railing? : A Lot 6 Click Score: 11    End of Session Equipment Utilized During Treatment: Gait belt Activity Tolerance: Patient tolerated treatment well Patient left: with call bell/phone within reach;in bed;with bed alarm set;with family/visitor present Nurse Communication: Mobility status PT Visit Diagnosis: Difficulty in walking, not elsewhere classified (R26.2)     Time: 1540-1610 PT Time Calculation (min) (ACUTE ONLY): 30 min  Charges:  $Gait Training: 8-22 mins $Therapeutic Exercise: 8-22 mins                    G Codes:          Carin Shipp 03/12/2017, 5:32 PM

## 2017-03-12 NOTE — Evaluation (Signed)
Occupational Therapy Evaluation Patient Details Name: Emily SaverLeslie R Hester MRN: [id number] DOB: 02/15/1971 Today's Date: 03/12/2017    History of Present Illness s/p R THA   Clinical Impression   This 46 year old female was admitted for the above sx. This was pt's first time up and she was limited by pain. She will have family's assistance for adls as needed. Will follow in acute setting to further educate on bathroom transfers and determine DME needs    Follow Up Recommendations  Supervision/Assistance - 24 hour    Equipment Recommendations  (to be further assessed: likely 3:1)    Recommendations for Other Services       Precautions / Restrictions Precautions Precautions: Fall Restrictions Weight Bearing Restrictions: No      Mobility Bed Mobility Overal bed mobility: Needs Assistance Bed Mobility: Sit to Supine       Sit to supine: Min assist   General bed mobility comments: assist with RLE  Transfers Overall transfer level: Needs assistance Equipment used: Rolling walker (2 wheeled) Transfers: Sit to/from Stand Sit to Stand: Min assist         General transfer comment: assist to rise and steady. Cues for UE/LE placement    Balance                                           ADL either performed or assessed with clinical judgement   ADL Overall ADL's : Needs assistance/impaired             Lower Body Bathing: Moderate assistance;Sit to/from stand       Lower Body Dressing: Moderate assistance;Sit to/from stand;With adaptive equipment   Toilet Transfer: Minimal assistance;Stand-pivot;RW(recliner)             General ADL Comments: this was pt's first time up. She had large extended family in the room and they will assist with adls as needed.  Pt wanted to see and try sock aide, which she did.  She can complete UB adls with set up     Vision         Perception     Praxis      Pertinent Vitals/Pain Pain Assessment:  0-10 Pain Score: 9  Pain Location: right hip Pain Descriptors / Indicators: Burning;Discomfort;Sore Pain Intervention(s): Limited activity within patient's tolerance;Monitored during session;Premedicated before session;Repositioned;Ice applied     Hand Dominance     Extremity/Trunk Assessment Upper Extremity Assessment Upper Extremity Assessment: Overall WFL for tasks assessed          Communication Communication Communication: No difficulties   Cognition Arousal/Alertness: Awake/alert Behavior During Therapy: WFL for tasks assessed/performed Overall Cognitive Status: Within Functional Limits for tasks assessed                                     General Comments       Exercises    Shoulder Instructions      Home Living Family/patient expects to be discharged to:: Private residence Living Arrangements: Spouse/significant other Available Help at Discharge: Family Type of Home: House Home Access: Stairs to enter Secretary/administratorntrance Stairs-Number of Steps: 4 Entrance Stairs-Rails: Right Home Layout: One level     Bathroom Shower/Tub: Producer, television/film/videoWalk-in shower   Bathroom Toilet: Handicapped height     Home Equipment: Environmental consultantWalker - 2 wheels  Additional Comments: sink/counter next to toilet      Prior Functioning/Environment Level of Independence: Independent                 OT Problem List: Decreased strength;Decreased activity tolerance;Decreased knowledge of use of DME or AE;Pain      OT Treatment/Interventions: Self-care/ADL training;DME and/or AE instruction;Patient/family education    OT Goals(Current goals can be found in the care plan section) Acute Rehab OT Goals Patient Stated Goal: home OT Goal Formulation: With patient Potential to Achieve Goals: Good ADL Goals Pt Will Perform Grooming: with supervision;standing Pt Will Transfer to Toilet: with min guard assist;ambulating;bedside commode Pt Will Perform Toileting - Clothing Manipulation and  hygiene: with min guard assist;sit to/from stand Pt Will Perform Tub/Shower Transfer: Shower transfer;with min guard assist;ambulating;3 in 1  OT Frequency: Min 2X/week   Barriers to D/C:            Co-evaluation              AM-PAC PT "6 Clicks" Daily Activity     Outcome Measure Help from another person eating meals?: None Help from another person taking care of personal grooming?: A Little Help from another person toileting, which includes using toliet, bedpan, or urinal?: A Little Help from another person bathing (including washing, rinsing, drying)?: A Lot Help from another person to put on and taking off regular upper body clothing?: A Little Help from another person to put on and taking off regular lower body clothing?: A Lot 6 Click Score: 17   End of Session    Activity Tolerance: Patient limited by pain Patient left: in bed;with call bell/phone within reach;with family/visitor present  OT Visit Diagnosis: Pain Pain - Right/Left: Right Pain - part of body: Hip                Time: 1610-96041040-1117 OT Time Calculation (min): 37 min Charges:  OT General Charges $OT Visit: 1 Visit OT Evaluation $OT Eval Low Complexity: 1 Low OT Treatments $Self Care/Home Management : 8-22 mins G-Codes:     Marica OtterMaryellen Murline Weigel, OTR/L [phone] 03/12/2017  Brittnee Gaetano 03/12/2017, 12:57 PM

## 2017-03-12 NOTE — Discharge Instructions (Signed)

## 2017-03-13 LAB — GLUCOSE, CAPILLARY
Glucose-Capillary: 121 mg/dL — ABNORMAL HIGH (ref 65–99)
Glucose-Capillary: 115 mg/dL — ABNORMAL HIGH (ref 65–99)
Glucose-Capillary: 132 mg/dL — ABNORMAL HIGH (ref 65–99)

## 2017-03-13 NOTE — Progress Notes (Signed)
Physical Therapy Treatment Patient Details Name: Emily SaverLeslie R Hester MRN: [id number] DOB: 10/08/1970 Today's Date: 03/13/2017    History of Present Illness s/p R THA    PT Comments    Pt moving well but limited by nausea, dizziness--had IV meds just prior to PT and this is likely the cause of her symptoms and inability to progress with PT; pt is not eating any food, only taking pills and reports mild constant nausea; discussed with RN and pt that possibly pt could take nausea meds and then attempt to eat something since more and more pain meds on an empty stomach could be further contributing to her nausea and overall feeling poorly; pt has a had a plethora of visitors and is not getting much rest, she  is agreeable to PT but generally unmotivated without external encouragement Will see how pt progresses with second PT session this pm  Follow Up Recommendations  Home health PT     Equipment Recommendations  Rolling walker with 5" wheels;3in1 (PT)    Recommendations for Other Services       Precautions / Restrictions Precautions Precautions: Fall Restrictions Other Position/Activity Restrictions: WBAT    Mobility  Bed Mobility         Supine to sit: Min assist;HOB elevated     General bed mobility comments: in recliner  Transfers Overall transfer level: Needs assistance Equipment used: Rolling walker (2 wheeled) Transfers: Sit to/from Stand Sit to Stand: Min guard;Supervision         General transfer comment: cues for LE placement  Ambulation/Gait Ambulation/Gait assistance: Min guard;Supervision Ambulation Distance (Feet): 22 Feet Assistive device: Rolling walker (2 wheeled) Gait Pattern/deviations: Step-to pattern;Antalgic Gait velocity: pt became, nauseous, dizzy, diaphoretic; chair to pt--BP 148/77; pt IV pain meds prior to session, likley this was cause of pt symptoms   General Gait Details: cues for trunk extension, step length and  sequence   Stairs            Wheelchair Mobility    Modified Rankin (Stroke Patients Only)       Balance                                            Cognition Arousal/Alertness: Awake/alert Behavior During Therapy: WFL for tasks assessed/performed Overall Cognitive Status: Within Functional Limits for tasks assessed                                        Exercises Total Joint Exercises Ankle Circles/Pumps: AROM;Both;15 reps    General Comments        Pertinent Vitals/Pain Pain Score: 7  Pain Location: right hip Pain Descriptors / Indicators: Aching;Burning Pain Intervention(s): Limited activity within patient's tolerance;Monitored during session;Premedicated before session;Ice applied    Home Living                      Prior Function            PT Goals (current goals can now be found in the care plan section) Acute Rehab PT Goals PT Goal Formulation: With patient Time For Goal Achievement: 03/19/17 Potential to Achieve Goals: Good Progress towards PT goals: Not progressing toward goals - comment(nausea, dizziness)    Frequency    7X/week  PT Plan Current plan remains appropriate    Co-evaluation              AM-PAC PT "6 Clicks" Daily Activity  Outcome Measure  Difficulty turning over in bed (including adjusting bedclothes, sheets and blankets)?: Unable Difficulty moving from lying on back to sitting on the side of the bed? : Unable Difficulty sitting down on and standing up from a chair with arms (e.g., wheelchair, bedside commode, etc,.)?: Unable Help needed moving to and from a bed to chair (including a wheelchair)?: A Little Help needed walking in hospital room?: A Little Help needed climbing 3-5 steps with a railing? : A Lot 6 Click Score: 11    End of Session Equipment Utilized During Treatment: Gait belt Activity Tolerance: Treatment limited secondary to medical complications  (Comment)(nausea, dizziness) Patient left: in chair;with call bell/phone within reach;with family/visitor present Nurse Communication: Mobility status PT Visit Diagnosis: Difficulty in walking, not elsewhere classified (R26.2)     Time: 3086-57841117-1135 PT Time Calculation (min) (ACUTE ONLY): 18 min  Charges:  $Gait Training: 8-22 mins                    G CodesDrucilla Hester:       Emily Hester, PT Pager: [phone] 03/13/2017    Emily ChaletWILLIAMS,Emily Hester 03/13/2017, 1:24 PM

## 2017-03-13 NOTE — Progress Notes (Signed)
Subjective: 2 Days Post-Op Procedure(s) (LRB): RIGHT TOTAL HIP ARTHROPLASTY ANTERIOR APPROACH (Right) Patient reports pain as mild.  Progressed well with PT yesterday.  No nausea/vomiting, lightheadedness/dizziness today.  Objective: Vital signs in last 24 hours: Temp:  [98 F (36.7 C)-98.4 F (36.9 C)] 98.4 F (36.9 C) (12/30 0543) Pulse Rate:  [86-97] 97 (12/30 0543) Resp:  [16-17] 16 (12/30 0543) BP: (122-133)/(69-74) 122/74 (12/30 0543) SpO2:  [97 %-99 %] 97 % (12/30 0839)  Intake/Output from previous day: 12/29 0701 - 12/30 0700 In: 840 [P.O.:840] Out: 1500 [Urine:1500] Intake/Output this shift: No intake/output data recorded.  Recent Labs    03/12/17 0537  HGB 11.0*   Recent Labs    03/12/17 0537  WBC 12.6*  RBC 4.07  HCT 34.1*  PLT 249   Recent Labs    03/12/17 0537  NA 135  K 3.9  CL 104  CO2 25  BUN 11  CREATININE 0.72  GLUCOSE 169*  CALCIUM 8.3*   No results for input(s): LABPT, INR in the last 72 hours.  Neurologically intact Neurovascular intact Sensation intact distally Intact pulses distally Dorsiflexion/Plantar flexion intact Incision: dressing C/D/I No cellulitis present Compartment soft  Assessment/Plan: 2 Days Post-Op Procedure(s) (LRB): RIGHT TOTAL HIP ARTHROPLASTY ANTERIOR APPROACH (Right) Advance diet Up with therapy D/C IV fluids Discharge home with home health today as long as she continues to progress with PT.  Will keep an additional night if she does not mobilize well.  WBAT RLE ABLA-mild and stable  Cristie HemMary L Liesel Peckenpaugh 03/13/2017, 11:17 AM

## 2017-03-13 NOTE — Discharge Summary (Signed)
Patient ID: Emily Hester MRN: [id number] DOB/AGE: 48/06/1970 48 y.o.  Admit date: 03/11/2017 Discharge date: 03/13/2017  Admission Diagnoses:  Principal Problem:   Unilateral primary osteoarthritis, right hip Active Problems:   Status post total replacement of right hip   Discharge Diagnoses:  Same  Past Medical History:  Diagnosis Date  . Arthritis   . Asthma   . Diabetes mellitus without complication (HCC)    type   . Hypertension   . Interstitial cystitis   . IUD (intrauterine device) in place placed 6 weeks ago  . Migraines     Surgeries: Procedure(s): RIGHT TOTAL HIP ARTHROPLASTY ANTERIOR APPROACH on 03/11/2017   Consultants:   Discharged Condition: Improved  Hospital Course: Emily SaverLeslie R Hester is an 46 y.o. female who was admitted 03/11/2017 for operative treatment ofUnilateral primary osteoarthritis, right hip. Patient has severe unremitting pain that affects sleep, daily activities, and work/hobbies. After pre-op clearance the patient was taken to the operating room on 03/11/2017 and underwent  Procedure(s): RIGHT TOTAL HIP ARTHROPLASTY ANTERIOR APPROACH.    Patient was given perioperative antibiotics:  Anti-infectives (From admission, onward)   Start     Dose/Rate Route Frequency Ordered Stop   03/11/17 2000  clindamycin (CLEOCIN) IVPB 600 mg     600 mg 100 mL/hr over 30 Minutes Intravenous Every 6 hours 03/11/17 1734 03/12/17 0231   03/11/17 1040  clindamycin (CLEOCIN) 900 MG/50ML IVPB    Comments:  Harvell, Gwendolyn  : cabinet override      03/11/17 1040 03/11/17 1400   03/11/17 1034  clindamycin (CLEOCIN) IVPB 900 mg     900 mg 100 mL/hr over 30 Minutes Intravenous On call to O.R. 03/11/17 1034 03/11/17 1400       Patient was given sequential compression devices, early ambulation, and chemoprophylaxis to prevent DVT.  Patient benefited maximally from hospital stay and there were no complications.    Recent vital signs:  Patient Vitals  for the past 24 hrs:  BP Temp Temp src Pulse Resp SpO2  03/13/17 0839 - - - - - 97 %  03/13/17 0543 122/74 98.4 F (36.9 C) Oral 97 16 99 %  03/12/17 2025 133/74 98 F (36.7 C) Oral 86 16 98 %  03/12/17 1938 - - - - - 98 %  03/12/17 1142 129/69 98.1 F (36.7 C) Axillary 88 17 98 %     Recent laboratory studies:  Recent Labs    03/12/17 0537  WBC 12.6*  HGB 11.0*  HCT 34.1*  PLT 249  NA 135  K 3.9  CL 104  CO2 25  BUN 11  CREATININE 0.72  GLUCOSE 169*  CALCIUM 8.3*     Discharge Medications:   Allergies as of 03/13/2017      Reactions   Amoxicillin Hives, Swelling, Other (See Comments)   Has patient had a PCN reaction causing immediate rash, facial/tongue/throat swelling, SOB or lightheadedness with hypotension: No Has patient had a PCN reaction causing severe rash involving mucus membranes or skin necrosis: No Has patient had a PCN reaction that required hospitalization: Yes Has patient had a PCN reaction occurring within the last 10 years: No  If all of the above answers are "NO", then may proceed with Cephalosporin use.   Sulfamethoxazole-trimethoprim Rash   Bactrim Rash      Medication List    STOP taking these medications   traMADol 50 MG tablet Commonly known as:  ULTRAM     TAKE these medications   acetaminophen 500 MG  tablet Commonly known as:  TYLENOL Take 1,000 mg by mouth every 6 (six) hours as needed for moderate pain or headache.   albuterol 108 (90 Base) MCG/ACT inhaler Commonly known as:  PROVENTIL HFA;VENTOLIN HFA Inhale 2 puffs into the lungs every 4 (four) hours as needed for wheezing or shortness of breath.   amLODipine 10 MG tablet Commonly known as:  NORVASC Take 10 mg by mouth every morning.   aspirin 81 MG chewable tablet Chew 1 tablet (81 mg total) by mouth 2 (two) times daily.   cetirizine 10 MG tablet Commonly known as:  ZYRTEC Take 1 tablet (10 mg total) by mouth daily.   fluticasone-salmeterol 115-21 MCG/ACT  inhaler Commonly known as:  ADVAIR HFA Inhale 2 puffs into the lungs 2 (two) times daily.   ibuprofen 200 MG tablet Commonly known as:  ADVIL,MOTRIN Take 400 mg by mouth every 6 (six) hours as needed for headache or moderate pain.   levonorgestrel 20 MCG/24HR IUD Commonly known as:  MIRENA 1 each by Intrauterine route once.   losartan 25 MG tablet Commonly known as:  COZAAR Take 25 mg by mouth daily.   metFORMIN 500 MG tablet Commonly known as:  GLUCOPHAGE Take 500 mg by mouth daily.   methocarbamol 500 MG tablet Commonly known as:  ROBAXIN Take 1 tablet (500 mg total) by mouth every 6 (six) hours as needed for muscle spasms.   mometasone-formoterol 100-5 MCG/ACT Aero Commonly known as:  DULERA Inhale 2 puffs into the lungs 2 (two) times daily.   montelukast 10 MG tablet Commonly known as:  SINGULAIR Take 1 tablet (10 mg total) by mouth at bedtime.   omeprazole 20 MG capsule Commonly known as:  PRILOSEC Take 1 capsule (20 mg total) by mouth every morning. What changed:  when to take this   oxyCODONE-acetaminophen 5-325 MG tablet Commonly known as:  ROXICET Take 1-2 tablets by mouth every 4 (four) hours as needed.   rosuvastatin 10 MG tablet Commonly known as:  CRESTOR Take 10 mg by mouth daily.            Durable Medical Equipment  (From admission, onward)        Start     Ordered   03/11/17 1735  DME 3 n 1  Once     03/11/17 1734   03/11/17 1735  DME Walker rolling  Once    Question:  Patient needs a walker to treat with the following condition  Answer:  Status post total replacement of right hip   03/11/17 1734      Diagnostic Studies: Dg Pelvis Portable  Result Date: 03/11/2017 CLINICAL DATA:  Hip replacement. EXAM: PORTABLE PELVIS 1-2 VIEWS COMPARISON:  No recent. FINDINGS: Total right hip replacement. Anatomic alignment. Hardware intact. No acute bony abnormality . Sclerotic density noted of the proximal left femur, most likely bone island. IUD  noted the pelvis. Pelvic calcifications most consistent phleboliths . IMPRESSION: Total right hip replacement with anatomic alignment. Hardware intact. Electronically Signed   By: Maisie Fushomas  Register   On: 03/11/2017 16:53   Dg C-arm 61-120 Min-no Report  Result Date: 03/11/2017 Fluoroscopy was utilized by the requesting physician.  No radiographic interpretation.   Dg Hip Operative Unilat W Or W/o Pelvis Left  Result Date: 03/11/2017 CLINICAL DATA:  RIGHT hip replacement EXAM: OPERATIVE RIGHT HIP (WITH PELVIS IF PERFORMED) to VIEWS TECHNIQUE: Fluoroscopic spot image(s) were submitted for interpretation post-operatively. COMPARISON:  01/27/2017 FINDINGS: Osseous mineralization grossly normal for technique. RIGHT hip prosthesis  identified in expected position. No fracture or dislocation seen on AP exam. IMPRESSION: RIGHT hip prosthesis without acute complication. Electronically Signed   By: Ulyses Southward M.D.   On: 03/11/2017 15:46    Disposition: 01-Home or Self Care    Follow-up Information    Kathryne Hitch, MD Follow up in 2 week(s).   Specialty:  Orthopedic Surgery Contact information: [address] [place] [address] [phone]            Signed: Cristie Hem 03/13/2017, 11:18 AM

## 2017-03-13 NOTE — Progress Notes (Signed)
Occupational Therapy Treatment Patient Details Name: Emily Hester MRN: [id number] DOB: 1971-02-22 Today's Date: 03/13/2017    History of present illness s/p R THA   OT comments  Performed adl and toilet transfer with 3;1 over commode.  Reviewed shower transfer but did not practice due to pain.  Educated on toilet aide.  Pt saw AE kit yesterday but will have assistance as needed at home  Follow Up Recommendations  Supervision/Assistance - 24 hour    Equipment Recommendations  3 in 1 bedside commode    Recommendations for Other Services      Precautions / Restrictions Precautions Precautions: Fall Restrictions Weight Bearing Restrictions: No Other Position/Activity Restrictions: WBAT       Mobility Bed Mobility         Supine to sit: Min assist;HOB elevated     General bed mobility comments: assist with RLE  Transfers   Equipment used: Rolling walker (2 wheeled)   Sit to Stand: Min guard;Supervision         General transfer comment: cues for LE placement    Balance                                           ADL either performed or assessed with clinical judgement   ADL Overall ADL's : Needs assistance/impaired     Grooming: Supervision/safety;Standing   Upper Body Bathing: Set up;Sitting   Lower Body Bathing: Minimal assistance;Sit to/from stand   Upper Body Dressing : Set up;Sitting       Toilet Transfer: Min guard;Ambulation;BSC;RW   Toileting- Clothing Manipulation and Hygiene: Minimal assistance;Sit to/from stand         General ADL Comments: ambulated to bathroom, completed adl and educated on toilet aide, if needed.  Pt was in too much pain to practice shower transfer. Demonstrated sequence for pt/mom and gave them a handout.  If pt remains tomorrow, we will try to practice this with her     Vision       Perception     Praxis      Cognition Arousal/Alertness: Awake/alert Behavior During Therapy: WFL  for tasks assessed/performed Overall Cognitive Status: Within Functional Limits for tasks assessed                                          Exercises     Shoulder Instructions       General Comments      Pertinent Vitals/ Pain       Pain Score: 9  Pain Location: right hip Pain Descriptors / Indicators: Aching;Burning Pain Intervention(s): Limited activity within patient's tolerance;Monitored during session;Premedicated before session;Repositioned;Ice applied  Home Living                                          Prior Functioning/Environment              Frequency           Progress Toward Goals  OT Goals(current goals can now be found in the care plan section)  Progress towards OT goals: Progressing toward goals     Plan      Co-evaluation  AM-PAC PT "6 Clicks" Daily Activity     Outcome Measure   Help from another person eating meals?: None Help from another person taking care of personal grooming?: A Little Help from another person toileting, which includes using toliet, bedpan, or urinal?: A Little Help from another person bathing (including washing, rinsing, drying)?: A Little Help from another person to put on and taking off regular upper body clothing?: A Little Help from another person to put on and taking off regular lower body clothing?: A Lot 6 Click Score: 18    End of Session    OT Visit Diagnosis: Pain Pain - Right/Left: Right Pain - part of body: Hip   Activity Tolerance Patient limited by pain   Patient Left in chair;with call bell/phone within reach;with family/visitor present   Nurse Communication          Time: 1004-1040 OT Time Calculation (min): 36 min  Charges: OT General Charges $OT Visit: 1 Visit OT Treatments $Self Care/Home Management : 23-37 mins  Lesle Chris, OTR/L [phone] 03/13/2017   Emily Hester 03/13/2017, 11:44 AM

## 2017-03-13 NOTE — Progress Notes (Signed)
Physical Therapy Treatment Patient Details Name: Emily SaverLeslie R Hester MRN: [id number] DOB: 09/18/1970 Today's Date: 03/13/2017    History of Present Illness s/p R THA    PT Comments    Pt progressing; session focused on HEP and pt self assisting in and out of bed; pt improving, will practice stairs in am and pt should be ready to D/C      Follow Up Recommendations  Home health PT     Equipment Recommendations  Rolling walker with 5" wheels;3in1 (PT)    Recommendations for Other Services       Precautions / Restrictions Precautions Precautions: Fall Restrictions Other Position/Activity Restrictions: WBAT    Mobility  Bed Mobility Overal bed mobility: Needs Assistance Bed Mobility: Sit to Supine;Supine to Sit     Supine to sit: Min assist;Min guard Sit to supine: Min assist;Min guard   General bed mobility comments: instructed in use of sheet loop for RLE to self assist  Transfers Overall transfer level: Needs assistance Equipment used: Rolling walker (2 wheeled) Transfers: Sit to/from Stand Sit to Stand: Min guard;Supervision         General transfer comment: cues for hand  placement and RLE position  Ambulation/Gait Ambulation/Gait assistance: Min guard;Supervision Ambulation Distance (Feet): 65 Feet Assistive device: Rolling walker (2 wheeled) Gait Pattern/deviations: Step-to pattern;Antalgic Gait velocity: pt became, nauseous, dizzy, diaphoretic; chair to pt--BP 148/77; pt IV pain meds prior to session, likley this was cause of pt symptoms   General Gait Details: cues for trunk extension, step length and sequence   Stairs            Wheelchair Mobility    Modified Rankin (Stroke Patients Only)       Balance                                            Cognition Arousal/Alertness: Awake/alert Behavior During Therapy: WFL for tasks assessed/performed Overall Cognitive Status: Within Functional Limits for tasks  assessed                                        Exercises Total Joint Exercises Ankle Circles/Pumps: AROM;Both;15 reps Quad Sets: AROM;Strengthening;Both;10 reps Heel Slides: AAROM;Right;10 reps Hip ABduction/ADduction: AAROM;Right;10 reps    General Comments        Pertinent Vitals/Pain Pain Score: 6  Pain Location: right hip Pain Descriptors / Indicators: Aching;Burning;Grimacing;Guarding Pain Intervention(s): Limited activity within patient's tolerance;Monitored during session;Premedicated before session;Ice applied    Home Living                      Prior Function            PT Goals (current goals can now be found in the care plan section) Acute Rehab PT Goals PT Goal Formulation: With patient Time For Goal Achievement: 03/19/17 Potential to Achieve Goals: Good Progress towards PT goals: Progressing toward goals    Frequency    7X/week      PT Plan Current plan remains appropriate    Co-evaluation              AM-PAC PT "6 Clicks" Daily Activity  Outcome Measure  Difficulty turning over in bed (including adjusting bedclothes, sheets and blankets)?: Unable Difficulty moving from lying on back to sitting  on the side of the bed? : Unable Difficulty sitting down on and standing up from a chair with arms (e.g., wheelchair, bedside commode, etc,.)?: Unable Help needed moving to and from a bed to chair (including a wheelchair)?: A Little Help needed walking in hospital room?: A Little Help needed climbing 3-5 steps with a railing? : A Lot 6 Click Score: 11    End of Session Equipment Utilized During Treatment: Gait belt Activity Tolerance: Patient tolerated treatment well Patient left: in bed;with call bell/phone within reach;with family/visitor present Nurse Communication: Mobility status PT Visit Diagnosis: Difficulty in walking, not elsewhere classified (R26.2)     Time: 1308-65781500-1519 PT Time Calculation (min) (ACUTE  ONLY): 19 min  Charges:  $Gait Training: 8-22 mins $Therapeutic Activity: 8-22 mins                    G CodesDrucilla Chalet:       Ader Fritze, PT Pager: [phone] 03/13/2017    Drucilla ChaletWILLIAMS,Emily Law 03/13/2017, 4:04 PM

## 2017-03-13 NOTE — Anesthesia Postprocedure Evaluation (Signed)
Anesthesia Post Note  [name]  Procedure(s) Performed: RIGHT TOTAL HIP ARTHROPLASTY ANTERIOR APPROACH (Right )     Patient location during evaluation: PACU Anesthesia Type: Spinal Level of consciousness: awake Pain management: satisfactory to patient Vital Signs Assessment: post-procedure vital signs reviewed and stable Respiratory status: spontaneous breathing Cardiovascular status: blood pressure returned to baseline Postop Assessment: no headache and spinal receding Anesthetic complications: no    Last Vitals:  Vitals:   03/13/17 0543 03/13/17 0839  BP: 122/74   Pulse: 97   Resp: 16   Temp: 36.9 C   SpO2: 99% 97%    Last Pain:  Vitals:   03/13/17 0810  TempSrc:   PainSc: 6                  Jerita Wimbush EDWARD

## 2017-03-14 LAB — GLUCOSE, CAPILLARY: Glucose-Capillary: 126 mg/dL — ABNORMAL HIGH (ref 65–99)

## 2017-03-14 NOTE — Progress Notes (Signed)
Physical Therapy Treatment Patient Details Name: Emily Hester MRN: [id number] DOB: 09/20/1970 Today's Date: 03/14/2017    History of Present Illness s/p R THA    PT Comments    Pt progressing well, continues to have some issues with pain; encouraged mobility at home and reviewed HEP; pt/mother and husband present; reviewed use of 3in1, how to adjust it etc; adjusted RW to correct height  Follow Up Recommendations  Home health PT     Equipment Recommendations  Rolling walker with 5" wheels;3in1 (PT)    Recommendations for Other Services       Precautions / Restrictions Precautions Precautions: Fall Restrictions Weight Bearing Restrictions: No Other Position/Activity Restrictions: WBAT    Mobility  Bed Mobility   Bed Mobility: Supine to Sit     Supine to sit: Min assist;Min guard     General bed mobility comments: light assist with RLE  Transfers Overall transfer level: Needs assistance Equipment used: Rolling walker (2 wheeled) Transfers: Sit to/from Stand Sit to Stand: Min guard;Supervision         General transfer comment: cues for hand  placement and RLE position  Ambulation/Gait Ambulation/Gait assistance: Min guard;Supervision Ambulation Distance (Feet): 45 Feet Assistive device: Rolling walker (2 wheeled) Gait Pattern/deviations: Step-to pattern;Antalgic Gait velocity: decr, very slow Gait velocity interpretation: Below normal speed for age/gender General Gait Details: cues for trunk extension, step length and sequence; slight right hip circumduction, encouraged knee hip flexion to decr circumduction   Stairs Stairs: Yes   Stair Management: Step to pattern;Forwards;With cane;One rail Right Number of Stairs: 3 General stair comments: cues for  technique and sequence  Wheelchair Mobility    Modified Rankin (Stroke Patients Only)       Balance                                            Cognition  Arousal/Alertness: Awake/alert Behavior During Therapy: WFL for tasks assessed/performed Overall Cognitive Status: Within Functional Limits for tasks assessed                                        Exercises Total Joint Exercises Ankle Circles/Pumps: AROM;Both;15 reps Quad Sets: AROM;Strengthening;Both;10 reps Heel Slides: AAROM;Right;10 reps    General Comments        Pertinent Vitals/Pain Pain Assessment: 0-10 Pain Score: 6  Pain Location: right hip Pain Descriptors / Indicators: Aching;Burning;Grimacing;Guarding Pain Intervention(s): Limited activity within patient's tolerance;Monitored during session;Premedicated before session;Repositioned    Home Living                      Prior Function            PT Goals (current goals can now be found in the care plan section) Acute Rehab PT Goals Patient Stated Goal: home PT Goal Formulation: With patient Time For Goal Achievement: 03/19/17 Potential to Achieve Goals: Good Progress towards PT goals: Progressing toward goals    Frequency    7X/week      PT Plan Current plan remains appropriate    Co-evaluation              AM-PAC PT "6 Clicks" Daily Activity  Outcome Measure  Difficulty turning over in bed (including adjusting bedclothes, sheets and blankets)?: Unable Difficulty moving from lying  on back to sitting on the side of the bed? : Unable Difficulty sitting down on and standing up from a chair with arms (e.g., wheelchair, bedside commode, etc,.)?: A Little Help needed moving to and from a bed to chair (including a wheelchair)?: A Little Help needed walking in hospital room?: A Little Help needed climbing 3-5 steps with a railing? : A Little 6 Click Score: 14    End of Session Equipment Utilized During Treatment: Gait belt Activity Tolerance: Patient tolerated treatment well Patient left: with call bell/phone within reach;with family/visitor present;in chair Nurse  Communication: Mobility status PT Visit Diagnosis: Difficulty in walking, not elsewhere classified (R26.2)     Time: [id number] PT Time Calculation (min) (ACUTE ONLY): 28 min  Charges:  $Gait Training: 23-37 mins                    G Codes:        Emily Hester 03/14/2017, 10:30 AM

## 2017-03-14 NOTE — Progress Notes (Signed)
Patient ID: Emily Hester, female   DOB: 12/26/1970, 46 y.o.   MRN: [id number] Making progress.  Vitals stable.  Right hip stable.  Can be discharged to home today.

## 2017-03-14 NOTE — Progress Notes (Signed)
Discharge planning, spoke with patient at bedside. Have chosen [facility] for HH PT. Contacted [facility] for referral. Needs RW and 3n1, contacted [facility] to deliver to room. [phone] 

## 2017-03-16 ENCOUNTER — Telehealth (INDEPENDENT_AMBULATORY_CARE_PROVIDER_SITE_OTHER): Payer: Self-pay | Admitting: Orthopaedic Surgery

## 2017-03-16 NOTE — Telephone Encounter (Signed)
[facility]  Verbal Order  [phone]  Three times a week for two weeks   Please call to verify pulled from VM

## 2017-03-16 NOTE — Telephone Encounter (Signed)
Verbal order given  

## 2017-03-24 ENCOUNTER — Encounter (INDEPENDENT_AMBULATORY_CARE_PROVIDER_SITE_OTHER): Payer: Self-pay | Admitting: Orthopaedic Surgery

## 2017-03-24 ENCOUNTER — Ambulatory Visit (INDEPENDENT_AMBULATORY_CARE_PROVIDER_SITE_OTHER): Payer: 59 | Admitting: Orthopaedic Surgery

## 2017-03-24 DIAGNOSIS — Z96641 Presence of right artificial hip joint: Secondary | ICD-10-CM

## 2017-03-24 MED ORDER — OXYCODONE-ACETAMINOPHEN 5-325 MG PO TABS: 1.0000 | ORAL_TABLET | Freq: Four times a day (QID) | ORAL | 0 refills | Status: DC | PRN

## 2017-03-24 NOTE — Progress Notes (Signed)
The patient is now 2 weeks status post a right total hip arthroplasty through direct anterior approach.  She is a morbidly obese individual this was definitely difficult surgery.  She is doing well otherwise.  She is concerned about a slight leg length discrepancy.  However she is having trouble getting around in general.  On examination the incision looks good.  All the staples been removed and Steri-Strips applied.  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  Overall though appears like she is doing well.  I did refill her oxycodone.  She will stop her twice daily aspirin.  We will see her back in 2 weeks to see how she is doing overall.

## 2017-03-29 ENCOUNTER — Ambulatory Visit (INDEPENDENT_AMBULATORY_CARE_PROVIDER_SITE_OTHER): Payer: 59 | Admitting: Orthopaedic Surgery

## 2017-03-29 ENCOUNTER — Encounter (INDEPENDENT_AMBULATORY_CARE_PROVIDER_SITE_OTHER): Payer: Self-pay | Admitting: Orthopaedic Surgery

## 2017-03-29 ENCOUNTER — Telehealth (INDEPENDENT_AMBULATORY_CARE_PROVIDER_SITE_OTHER): Payer: Self-pay | Admitting: Orthopaedic Surgery

## 2017-03-29 ENCOUNTER — Telehealth (INDEPENDENT_AMBULATORY_CARE_PROVIDER_SITE_OTHER): Payer: Self-pay | Admitting: Radiology

## 2017-03-29 DIAGNOSIS — Z96641 Presence of right artificial hip joint: Secondary | ICD-10-CM

## 2017-03-29 MED ORDER — DOXYCYCLINE HYCLATE 100 MG PO TABS: 100.0000 mg | ORAL_TABLET | Freq: Two times a day (BID) | ORAL | 1 refills | Status: DC

## 2017-03-29 NOTE — Progress Notes (Signed)
The patient is here for continued follow-up of her right total hip arthroplasty.  She is 18 days out from surgery and is morbidly obese individual.  We brought her back in today is some drainage from her hip incision.  On examination there is some indurated tissue and drainage from incision.  I was able to aspirate 180 cc of fluid around the surrounding soft tissue and adipose tissue which decompressed this.  I like to see her back in just 2 days to repeat an aspiration decompression such as this.  I will put her on doxycycline and if I  do not feel she is making progress we would then set her up for surgery this Friday for an irrigation and debridement of the surrounding soft tissues and adipose tissue in her right thigh.

## 2017-03-29 NOTE — Telephone Encounter (Signed)
Patient calling triage this morning status post THA, and sp aspiration of seroma ~ 5 days ago. Complaining of incision opening, after steri strip had fallen off. There is increase of drainage, she states her leg is warm but denies fever. Spoke with Morrie SheldonAshley and Dr. Magnus IvanBlackman, and patient is going to come in this morning ~1030am for reevaluation.

## 2017-03-29 NOTE — Telephone Encounter (Signed)
Error sent to triage.  

## 2017-03-31 ENCOUNTER — Ambulatory Visit (INDEPENDENT_AMBULATORY_CARE_PROVIDER_SITE_OTHER): Payer: 59 | Admitting: Orthopaedic Surgery

## 2017-03-31 ENCOUNTER — Encounter (INDEPENDENT_AMBULATORY_CARE_PROVIDER_SITE_OTHER): Payer: Self-pay | Admitting: Physician Assistant

## 2017-03-31 DIAGNOSIS — Z96641 Presence of right artificial hip joint: Secondary | ICD-10-CM

## 2017-03-31 NOTE — Progress Notes (Signed)
The patient is continue to follow-up closely status post a right total hip arthroplasty.  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  I saw her 2 days ago and drained a large amount of fluid off of the soft tissue of her right hip and put her on doxycycline twice daily.  Now she is here 2 days later.  She feels better overall.  On assessment of the soft tissues the redness and induration has subsided.  There is a small open area that is been draining she is going to continue to treat this with daily Bactroban ointment and keeping clean daily in the shower with antibacterial soap.  I will also keep on doxycycline as well.  I was able to clean the area of the hip with Betadine alcohol and aspirated another 120 cc of seroma fluid.  I would like to see her back on Monday which is 4 days from now for repeat aspiration.

## 2017-04-04 ENCOUNTER — Encounter (INDEPENDENT_AMBULATORY_CARE_PROVIDER_SITE_OTHER): Payer: Self-pay | Admitting: Physician Assistant

## 2017-04-04 ENCOUNTER — Ambulatory Visit (INDEPENDENT_AMBULATORY_CARE_PROVIDER_SITE_OTHER): Payer: 59 | Admitting: Physician Assistant

## 2017-04-04 DIAGNOSIS — Z96641 Presence of right artificial hip joint: Secondary | ICD-10-CM

## 2017-04-04 MED ORDER — METHOCARBAMOL 500 MG PO TABS: 500.0000 mg | ORAL_TABLET | Freq: Four times a day (QID) | ORAL | 0 refills | Status: DC | PRN

## 2017-04-04 NOTE — Progress Notes (Signed)
Mrs. Montey HoraCritchfield returns today follow-up of her right hip wound status post right total hip arthroplasty now 24 days postop.  She is here mainly for wound check today.  She is on doxycycline and using Bactroban ointment over the wound.  Denies any fevers has chills occasionally.  Physical exam: General well-developed well-nourished female no acute distress. Right hip wound distal incision with small area of wound dehiscence that is no longer draining.  Overall erythema induration is improved.  There is slight firmness.  The hip area was prepped with Betadine and ethyl chloride used to anesthetize skin 140 cc of seroma fluid of pain patient tolerates well.  Plan: She will follow-up with us on Thursday for repeat aspiration.  Continue current care with doxycycline and mupirocin and washing the wound with an antibacterial soap daily.

## 2017-04-07 ENCOUNTER — Ambulatory Visit (INDEPENDENT_AMBULATORY_CARE_PROVIDER_SITE_OTHER): Payer: 59 | Admitting: Orthopaedic Surgery

## 2017-04-07 ENCOUNTER — Encounter (INDEPENDENT_AMBULATORY_CARE_PROVIDER_SITE_OTHER): Payer: Self-pay | Admitting: Orthopaedic Surgery

## 2017-04-07 DIAGNOSIS — Z96641 Presence of right artificial hip joint: Secondary | ICD-10-CM

## 2017-04-07 NOTE — Progress Notes (Signed)
Patient is continue to follow-up status post a right total hip arthroplasty.  We have been following her closely due to postop seroma and morbid obesity.  She has cellulitis that is now resolved.  She is feeling much better overall other than stiffness.  She is walking without assistive device.  Given her size I do not want her taking to into the 1 muscle relaxant when she needs it.  On examination of her incision it is healed over completely.  There is no erythema or induration.  I was able to aspirate about 70-80 cc of fluid from the area which is much less than we had before.  At this point I will see her back in a week to see how she is doing overall.  She will continue doxycycline as well.  All questions concerns were answered and addressed.

## 2017-04-14 ENCOUNTER — Ambulatory Visit (INDEPENDENT_AMBULATORY_CARE_PROVIDER_SITE_OTHER): Payer: 59 | Admitting: Orthopaedic Surgery

## 2017-04-14 ENCOUNTER — Encounter (INDEPENDENT_AMBULATORY_CARE_PROVIDER_SITE_OTHER): Payer: Self-pay | Admitting: Orthopaedic Surgery

## 2017-04-14 DIAGNOSIS — Z96641 Presence of right artificial hip joint: Secondary | ICD-10-CM

## 2017-04-14 MED ORDER — DOXYCYCLINE HYCLATE 100 MG PO TABS: 100.0000 mg | ORAL_TABLET | Freq: Two times a day (BID) | ORAL | 1 refills | Status: DC

## 2017-04-14 NOTE — Progress Notes (Signed)
The patient is now 1 month status post a right total hip arthroplasty.  Her postoperative course has been complicated by persistent postoperative seroma as well as indurated tissue and a small wound.  She is morbidly obese individual.  She is feeling stiff but feeling better overall.  She denies any fever, chills, nausea, vomiting.  On examination of her right hip incision there is no induration at all.  There is no firmness.  There is some draining at the very distal aspect of this and I only aspirated 30 cc of fluid from the tissue.  I unroofed the keloid at the end of this and got good bleeding tissue with no gross purulence at all.  At this point I have her placed Bactroban ointment on the wound daily and keep it clean once a day in the shower with soap and water.  She will continue the doxycycline as well and I will see her back in 1 week.  I do feel that we are heading towards this healing completely.  She is doing excellent otherwise except for the small wound.  I do not see a need to proceed with any type of surgical intervention.  Again we will see her from a wound standpoint next week.

## 2017-04-21 ENCOUNTER — Encounter (INDEPENDENT_AMBULATORY_CARE_PROVIDER_SITE_OTHER): Payer: Self-pay | Admitting: Physician Assistant

## 2017-04-21 ENCOUNTER — Ambulatory Visit (INDEPENDENT_AMBULATORY_CARE_PROVIDER_SITE_OTHER): Payer: 59 | Admitting: Physician Assistant

## 2017-04-21 ENCOUNTER — Ambulatory Visit (INDEPENDENT_AMBULATORY_CARE_PROVIDER_SITE_OTHER): Payer: 59 | Admitting: Orthopaedic Surgery

## 2017-04-21 DIAGNOSIS — Z96641 Presence of right artificial hip joint: Secondary | ICD-10-CM

## 2017-04-21 MED ORDER — OXYCODONE-ACETAMINOPHEN 5-325 MG PO TABS: 1.0000 | ORAL_TABLET | Freq: Four times a day (QID) | ORAL | 0 refills | Status: DC | PRN

## 2017-04-21 NOTE — Progress Notes (Signed)
The patient is a 47 year old who is 5 weeks status post a right total hip arthroplasty.  She is morbidly obese and her postoperative course was uncomplicated from some cellulitis and adipose tissue necrosis with small open wound.  We have had her on oral antibiotics.  She has had some drainage.  On exam this is some time in seeing from week to week.  It looks significantly better overall.  There is no induration and no redness at all.  There is a small opening distally.  I was able to express some fluid from this.  I then placed an 18-gauge needle deep into the incision and got no fluid from this at all.  Next I cleaned it with Betadine alcohol and after anesthetizing this with lidocaine I ellipsed out the distal aspect of her incision and then closed it with 2 interrupted 2-0 nylon sutures.  Family continue appropriate daily wound care I would like to see her back in 2 weeks to see how she is doing overall.  She is still increase her activities as comfort allows.

## 2017-04-29 ENCOUNTER — Telehealth (INDEPENDENT_AMBULATORY_CARE_PROVIDER_SITE_OTHER): Payer: Self-pay | Admitting: Orthopaedic Surgery

## 2017-04-29 NOTE — Telephone Encounter (Signed)
Patient called needing a note for her employer stating she is still not able to return to work. Patient asked if the note can be on [facility] letter head. Patient said her disability started on 05/04/17 and when she will be able to return to work. Patient would like to pick up note Monday. Patient advised her 3 months will not be up until 06/09/17. The number to contact patient is [phone]

## 2017-05-01 ENCOUNTER — Other Ambulatory Visit (INDEPENDENT_AMBULATORY_CARE_PROVIDER_SITE_OTHER): Payer: Self-pay | Admitting: Physician Assistant

## 2017-05-02 ENCOUNTER — Encounter (INDEPENDENT_AMBULATORY_CARE_PROVIDER_SITE_OTHER): Payer: Self-pay

## 2017-05-02 NOTE — Telephone Encounter (Signed)
Is this ok?

## 2017-05-02 NOTE — Telephone Encounter (Signed)
Please advise 

## 2017-05-02 NOTE — Telephone Encounter (Signed)
Patient called inquiring about the status of her work note.  She is needing to turn this in ASAP and would like to have it today.  Thank you.

## 2017-05-02 NOTE — Telephone Encounter (Signed)
Yes ok to return to work.

## 2017-05-02 NOTE — Telephone Encounter (Signed)
Patient aware note at front desk  

## 2017-05-04 ENCOUNTER — Ambulatory Visit (INDEPENDENT_AMBULATORY_CARE_PROVIDER_SITE_OTHER): Payer: 59 | Admitting: Orthopaedic Surgery

## 2017-05-04 ENCOUNTER — Encounter (INDEPENDENT_AMBULATORY_CARE_PROVIDER_SITE_OTHER): Payer: Self-pay | Admitting: Orthopaedic Surgery

## 2017-05-04 DIAGNOSIS — Z96641 Presence of right artificial hip joint: Secondary | ICD-10-CM

## 2017-05-04 NOTE — Progress Notes (Signed)
Patient is now about 6-7 weeks status post a right total hip arthroplasty.  Her postoperative course has been complicated from wound breakdown.  She is ambulate with a cane and been on antibiotics.  She is here for continued follow-up.  On examination today I did take out to inferior sutures and the wound is healed over completely now.  There is no drainage no redness or induration.  At this point I need to keep her out of work for an extended period of time as she recovers from surgery and needs physical therapy to work on gait training as well as balance and coordination and strengthening of her right hip.  I would not need to see her back for 6 weeks.  We will allow her to return to work starting April 22.  Obviously if there is any issues prior to me seeing her back in 6 weeks she knows that let us know and come see us.

## 2017-05-05 ENCOUNTER — Other Ambulatory Visit (INDEPENDENT_AMBULATORY_CARE_PROVIDER_SITE_OTHER): Payer: Self-pay

## 2017-05-05 DIAGNOSIS — Z96641 Presence of right artificial hip joint: Secondary | ICD-10-CM

## 2017-05-12 ENCOUNTER — Ambulatory Visit: Payer: 59 | Attending: Orthopaedic Surgery

## 2017-05-12 ENCOUNTER — Other Ambulatory Visit: Payer: Self-pay

## 2017-05-12 DIAGNOSIS — Z96642 Presence of left artificial hip joint: Secondary | ICD-10-CM | POA: Insufficient documentation

## 2017-05-12 DIAGNOSIS — R262 Difficulty in walking, not elsewhere classified: Secondary | ICD-10-CM | POA: Diagnosis present

## 2017-05-12 DIAGNOSIS — M6281 Muscle weakness (generalized): Secondary | ICD-10-CM | POA: Insufficient documentation

## 2017-05-12 DIAGNOSIS — M25651 Stiffness of right hip, not elsewhere classified: Secondary | ICD-10-CM | POA: Diagnosis present

## 2017-05-12 NOTE — Therapy (Signed)
[facility] [address] [place], [place], [address] Phone: [phone]   Fax:  [phone]  Physical Therapy Evaluation  Patient Details  Name: Emily SaverLeslie R Hester MRN: [id number] Date of Birth: 08/19/1970 Referring Provider: Doneen Poissonhristopher Blackman  MD   Encounter Date: 05/12/2017  PT End of Session - 05/12/17 1552    Visit Number  1    Number of Visits  17    Date for PT Re-Evaluation  07/08/17    PT Start Time  0300    PT Stop Time  0345    PT Time Calculation (min)  45 min    Activity Tolerance  Patient tolerated treatment well    Behavior During Therapy  [facility] for tasks assessed/performed       Past Medical History:  Diagnosis Date  . Arthritis   . Asthma   . Diabetes mellitus without complication (HCC)    type   . Hypertension   . Interstitial cystitis   . IUD (intrauterine device) in place placed 6 weeks ago  . Migraines     Past Surgical History:  Procedure Laterality Date  . DILATION AND EVACUATION  09/30/2011   Procedure: DILATATION AND EVACUATION;  Surgeon: Shakia AndreaJames E Tomblin II, MD;  Location: [facility];  Service: Gynecology;  Laterality: N/A;  . left knee surgery      arthroscopy   . TOTAL HIP ARTHROPLASTY Right 03/11/2017   Procedure: RIGHT TOTAL HIP ARTHROPLASTY ANTERIOR APPROACH;  Surgeon: Kathryne HitchBlackman, Christopher Y, MD;  Location: [facility];  Service: Orthopedics;  Laterality: Right;    There were no vitals filed for this visit.   Subjective Assessment - 05/12/17 1507    Subjective  She is post THA  and is 2 months post surgery needing cane for walking. She had HHPT and reports doing HEP 1x/day.  Hip issues for a year or more.   need to restrain juveniles. and run. Last ran  6-8 months ago.     Limitations  Walking bending, climbing stairs     How long can you sit comfortably?  60 min then stands    How long can you stand comfortably?  15 min    How long can you walk comfortably?  with cane 150 feet    Patient  Stated Goals  She wants to get stronger and have less pain, mobility difficulty.       Currently in Pain?  Yes    Pain Score  3     Pain Location  Hip    Pain Orientation  Right    Pain Descriptors / Indicators  Aching    Pain Type  Chronic pain    Pain Onset  More than a month ago    Pain Frequency  Constant    Aggravating Factors   sitting/standing /walking.     Pain Relieving Factors  change positions,            [facility] - 05/12/17 0001      Assessment   Medical Diagnosis  RT THA    Referring Provider  Doneen Poissonhristopher Blackman  MD    Onset Date/Surgical Date  03/11/17    Next MD Visit  06/15/17    Prior Therapy  HHPT for 4 visits      Precautions   Precautions  None      Restrictions   Weight Bearing Restrictions  No      Balance Screen   Has the patient fallen in the past 6  months  No    Has the patient had a decrease in activity level because of a fear of falling?   No    Is the patient reluctant to leave their home because of a fear of falling?   No      Prior Function   Level of Independence  Needs assistance with ADLs;Requires assistive device for independence;Needs assistance with homemaking    Vocation  Full time employment    Vocation Requirements  restraining juveniles. running. 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  , walking      Cognition   Overall Cognitive Status  Within Functional Limits for tasks assessed      Observation/Other Assessments   Focus on Therapeutic Outcomes (FOTO)   59% limited      ROM / Strength   AROM / PROM / Strength  AROM;PROM;Strength      AROM   Overall AROM Comments  trunk felxion she can touch mid tibia      PROM   Overall PROM Comments  LT hip WNL , RT hip Ir 15 degrees  , adduction 10 degrees ER 40 degrees   LT hip adductio 25 degrees , IR 25 degrees ER 65 ddegrees      Strength   Right/Left Hip  Right;Left    Right Hip Flexion  3/5    Right Hip Extension  4/5    Right Hip External Rotation   4+/5    Right  Hip Internal Rotation  4-/5    Right Hip ABduction  3+/5    Left Hip Flexion  4+/5    Left Hip Extension  4+/5    Left Hip External Rotation  5/5    Left Hip Internal Rotation  5/5    Left Hip ABduction  4/5      Flexibility   Soft Tissue Assessment /Muscle Length  yes    Hamstrings  80 degrees      Ambulation/Gait   Gait Comments  compensated trendelenberg without cane             Objective measurements completed on examination: See above findings.                PT Short Term Goals - 05/12/17 1441      PT SHORT TERM GOAL #1   Title  She willl be independent with iniital HEP    Time  3    Period  Weeks    Status  New      PT SHORT TERM GOAL #2   Title  She will be able to        Time  3    Period  Weeks    Status  New      PT SHORT TERM GOAL #3   Title  pain will ease 30 % or more     Time  3    Period  Weeks    Status  New        PT Long Term Goals - 05/12/17 1442      PT LONG TERM GOAL #1   Title  He will be independent with all HEP issued    Time  8    Period  Weeks    Status  New      PT LONG TERM GOAL #2   Title  She will report no pain with general community walking without device    Time  8    Period  Weeks  Status  New      PT LONG TERM GOAL #3   Title  She will be able to walk 12 steps with one rail step over step.     Time  8    Period  Weeks    Status  New      PT LONG TERM GOAL #4   Title  She will be able to   walk without compensation to be able to return to work.     Time  8    Period  Weeks    Status  New      PT LONG TERM GOAL #5   Title  , 40% limited to demo functional improvement    Baseline  59% limited    Time  8    Period  Weeks    Status  New      Additional Long Term Goals   Additional Long Term Goals  Yes      PT LONG TERM GOAL #6   Title  RT hip strength improove to 4/5 or bette to porgress to walking without device    Time  8    Period  Weeks    Status  New             Plan -  05/12/17 1445    Clinical Impression Statement  Ms Brosky presents 2 months post RT THA with slow progress needing can for ambulation , constant pain and weakness /stiffness of RT hip . 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  She is not doing her HEP as frequently as neecded for progress. She should improve with PT    History and Personal Factors relevant to plan of care:  cihronic hip pain for a year prior to surgery, abnormal swelling requiring draining, limited exercise.  obesity, physically demanding job    Clinical Presentation  Evolving    Clinical Decision Making  Moderate    Rehab Potential  Good    PT Frequency  2x / week    PT Duration  6 weeks    PT Treatment/Interventions  Cryotherapy;Therapeutic exercise;Therapeutic activities;Patient/family education;Functional mobility training;Manual techniques;Taping;Passive range of motion;Gait training;Stair training    PT Next Visit Plan  HEP , strengthening, modalities as needed    PT Home Exercise Plan  Reveiwe HHPT HEP  and progress as able , modaloities , manual, gait    Consulted and Agree with Plan of Care  Patient       Patient will benefit from skilled therapeutic intervention in order to improve the following deficits and impairments:  Pain, Decreased activity tolerance, Decreased endurance, Decreased strength, Difficulty walking  Visit Diagnosis: H/O total hip arthroplasty, left  Muscle weakness (generalized)  Stiffness of right hip, not elsewhere classified  Difficulty in walking, not elsewhere classified     Problem List Patient Active Problem List   Diagnosis Date Noted  . 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  . Unilateral primary osteoarthritis, right hip 01/27/2017    Caprice Red  PT 05/12/2017, 3:53 PM  [facility] [address] [place], [place], [address] Phone: [phone]   Fax:  [phone]  Name:  ALFRIEDA TARRY MRN: [id number] Date of Birth: 03-23-1970

## 2017-05-23 ENCOUNTER — Telehealth (INDEPENDENT_AMBULATORY_CARE_PROVIDER_SITE_OTHER): Payer: Self-pay | Admitting: Orthopaedic Surgery

## 2017-05-23 NOTE — Telephone Encounter (Signed)
Patient is giving you a call back and still would like to speak with you. Also, she was wondering if she can be prescribed something other than robaxin for the pain in her leg, she said its not working well for her anymore. Please advise # [phone]

## 2017-05-23 NOTE — Telephone Encounter (Signed)
Patient called asking for a call back regarding her health and work, she states it's too much information to type in a short message and she would rather just talk to you. CB # [phone]

## 2017-05-23 NOTE — Telephone Encounter (Signed)
I can fill out forms for short-term disability stating that she had surgery and will be recovering for surgery for up to 6 months postoperative.

## 2017-05-23 NOTE — Telephone Encounter (Signed)
States she is still having difficulty walking She is having issues with her job, wants to talk to you about this about disability retirement  She has forms and wondering if you will fill this out  Its not for disability its just to retire from her position?

## 2017-05-24 ENCOUNTER — Telehealth (INDEPENDENT_AMBULATORY_CARE_PROVIDER_SITE_OTHER): Payer: Self-pay | Admitting: Orthopaedic Surgery

## 2017-05-24 ENCOUNTER — Ambulatory Visit: Payer: 59 | Attending: Orthopaedic Surgery

## 2017-05-24 ENCOUNTER — Other Ambulatory Visit (INDEPENDENT_AMBULATORY_CARE_PROVIDER_SITE_OTHER): Payer: Self-pay | Admitting: Orthopaedic Surgery

## 2017-05-24 DIAGNOSIS — M6281 Muscle weakness (generalized): Secondary | ICD-10-CM | POA: Insufficient documentation

## 2017-05-24 DIAGNOSIS — Z96642 Presence of left artificial hip joint: Secondary | ICD-10-CM | POA: Diagnosis present

## 2017-05-24 DIAGNOSIS — Z96641 Presence of right artificial hip joint: Secondary | ICD-10-CM | POA: Insufficient documentation

## 2017-05-24 DIAGNOSIS — R262 Difficulty in walking, not elsewhere classified: Secondary | ICD-10-CM | POA: Insufficient documentation

## 2017-05-24 DIAGNOSIS — M25651 Stiffness of right hip, not elsewhere classified: Secondary | ICD-10-CM | POA: Diagnosis present

## 2017-05-24 MED ORDER — TIZANIDINE HCL 4 MG PO CAPS: 4.0000 mg | ORAL_CAPSULE | Freq: Three times a day (TID) | ORAL | 1 refills | Status: DC | PRN

## 2017-05-24 NOTE — Patient Instructions (Signed)
Hip flexion HIP: Abduction / External Rotation (Band)    Place band around knees. Lie on side with hips and knees bent. Raise top knee up, squeezing glutes. Keep feet together. Hold ___ seconds. Use ________ band. 12-20___ reps per set, _1__ sets per day, _7__ days per week   Copyright  VHI. All rights reserved.  HIP: Abduction - Side-Lying    Lie on side, legs straight and in line with trunk. Squeeze glutes. Raise top leg up and slightly back. Point toes forward. __12-20_ reps per set, __1_ sets per day, ___ days per week Bend bottom leg to stabilize pelvis.  Copyright  VHI. All rights reserved.  Bridge    Lie back, legs bent. Inhale, pressing hips up. Keeping ribs in, lengthen lower back. Exhale, rolling down along spine from top. Repeat _12-20___ times. Do _1___ sessions per day.  http://pm.exer.us/55   Copyright  VHI. All rights reserved.  Adduction: Hip - Knees Together (Semi-Reclined)    Partially recline with towel roll between knees. Push knees together. Hold for _5-10__ seconds. Rest for ___ seconds. Repeat 12-20___ times. Do ___ times a day.   Copyright  VHI. All rights reserved.  Hip Flexion - Supine    Lying on back, knees bent, feet on floor, bend hips, bringing knees toward trunk.    ONE LEG AT A TIME Repeat 12-20___ times. Do __1_ times per day.  Copyright  VHI. All rights reserved.

## 2017-05-24 NOTE — Telephone Encounter (Signed)
LMOM for patient of the below message from CB, and also asked her to call back and let me know what medication she had in mind

## 2017-05-24 NOTE — Therapy (Signed)
[facility] [address] [place], [place], [address] Phone: [phone]   Fax:  [phone]  Physical Therapy Treatment  Patient Details  Name: Emily Hester MRN: [id number] Date of Birth: 11-Jan-1971 Referring Provider: [name]  MD   Encounter Date: 05/24/2017  PT End of Session - 05/24/17 1546    Visit Number  2    Number of Visits  17    Date for PT Re-Evaluation  07/08/17    PT Start Time  0300    PT Stop Time  0350    PT Time Calculation (min)  50 min    Activity Tolerance  Patient tolerated treatment well    Behavior During Therapy  [facility] for tasks assessed/performed       Past Medical History:  Diagnosis Date  . Arthritis   . Asthma   . Diabetes mellitus without complication (HCC)    type   . Hypertension   . Interstitial cystitis   . IUD (intrauterine device) in place placed 6 weeks ago  . Migraines     Past Surgical History:  Procedure Laterality Date  . DILATION AND EVACUATION  09/30/2011   Procedure: DILATATION AND EVACUATION;  Surgeon: Brannon Andrea, MD;  Location: [facility];  Service: Gynecology;  Laterality: N/A;  . left knee surgery      arthroscopy   . TOTAL HIP ARTHROPLASTY Right 03/11/2017   Procedure: RIGHT TOTAL HIP ARTHROPLASTY ANTERIOR APPROACH;  Surgeon: Kathryne Hitch, MD;  Location: [facility];  Service: Orthopedics;  Laterality: Right;    There were no vitals filed for this visit.  Subjective Assessment - 05/24/17 1508    Subjective  She feels like she is regressing with pain and swelling.       Currently in Pain?  Yes    Pain Score  4     Pain Location  Hip    Pain Orientation  Right    Pain Descriptors / Indicators  Aching    Pain Type  Chronic pain    Pain Onset  More than a month ago    Pain Frequency  Constant    Aggravating Factors   sit/stand /walk    Pain Relieving Factors  chang positions                      [facility] Adult PT  Treatment/Exercise - 05/24/17 0001      Exercises   Exercises  Knee/Hip      Knee/Hip Exercises: Aerobic   Nustep  L4 6 min LE      Knee/Hip Exercises: Standing   Other Standing Knee Exercises  facing wall with reach of RT and Lt hand and stand on RT foot with fglute contraciton Rt and to left with RT leg lift abduciton x 12 reps each      Knee/Hip Exercises: Supine   Other Supine Knee/Hip Exercises  bent knee raise 3x5 rpes Rt/Lt  with cue for abdominals t assist    Other Supine Knee/Hip Exercises  PPT x 12 after cues, then ball squeeze with PPT x 15 then red band clam with glut lift x 15      Modalities   Modalities  Moist Heat      Moist Heat Therapy   Number Minutes Moist Heat  -- 15 , 10 during exer session    Moist Heat Location  Hip RT             PT  Education - 05/24/17 1545    Education provided  Yes    Education Details  HEP    Person(s) Educated  Patient    Methods  Explanation;Demonstration;Tactile cues;Verbal cues;Handout    Comprehension  Returned demonstration;Verbalized understanding       PT Short Term Goals - 05/12/17 1441      PT SHORT TERM GOAL #1   Title  She willl be independent with iniital HEP    Time  3    Period  Weeks    Status  New      PT SHORT TERM GOAL #2   Title  She will be able to        Time  3    Period  Weeks    Status  New      PT SHORT TERM GOAL #3   Title  pain will ease 30 % or more     Time  3    Period  Weeks    Status  New        PT Long Term Goals - 05/12/17 1442      PT LONG TERM GOAL #1   Title  He will be independent with all HEP issued    Time  8    Period  Weeks    Status  New      PT LONG TERM GOAL #2   Title  She will report no pain with general community walking without device    Time  8    Period  Weeks    Status  New      PT LONG TERM GOAL #3   Title  She will be able to walk 12 steps with one rail step over step.     Time  8    Period  Weeks    Status  New      PT LONG TERM GOAL  #4   Title  She will be able to   walk without compensation to be able to return to work.     Time  8    Period  Weeks    Status  New      PT LONG TERM GOAL #5   Title  , 40% limited to demo functional improvement    Baseline  59% limited    Time  8    Period  Weeks    Status  New      Additional Long Term Goals   Additional Long Term Goals  Yes      PT LONG TERM GOAL #6   Title  RT hip strength improove to 4/5 or bette to porgress to walking without device    Time  8    Period  Weeks    Status  New            Plan - 05/24/17 1548    Clinical Impression Statement  She did well with all exer though weak and with pain     Encouraged  to do HEP as able and that the importance of strenmgth related to pain  and function.    PT Treatment/Interventions  Cryotherapy;Therapeutic exercise;Therapeutic activities;Patient/family education;Functional mobility training;Manual techniques;Taping;Passive range of motion;Gait training;Stair training    PT Next Visit Plan  HEP , strengthening, modalities as needed    PT Home Exercise Plan  hip abduction ,clam, bridge, bent knee lift    Consulted and Agree with Plan of Care  Patient       Patient  will benefit from skilled therapeutic intervention in order to improve the following deficits and impairments:  Pain, Decreased activity tolerance, Decreased endurance, Decreased strength, Difficulty walking  Visit Diagnosis: H/O total hip arthroplasty, right  Muscle weakness (generalized)  Stiffness of right hip, not elsewhere classified  Difficulty in walking, not elsewhere classified     Problem List Patient Active Problem List   Diagnosis Date Noted  . 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  . Unilateral primary osteoarthritis, right hip 01/27/2017    Caprice Red  PT 05/24/2017, 3:57 PM  [facility] [address] [place], [place], [address] Phone:  [phone]   Fax:  [phone]  Name: Emily Hester MRN: [id number] Date of Birth: 12-10-1970

## 2017-05-24 NOTE — Telephone Encounter (Signed)
I sent Zanaflex in to her pharmacy.

## 2017-05-24 NOTE — Telephone Encounter (Signed)
Patient request another/stronger muscle relaxer [facility] [address]

## 2017-05-24 NOTE — Telephone Encounter (Signed)
Please advise 

## 2017-05-26 ENCOUNTER — Ambulatory Visit: Payer: 59

## 2017-05-26 ENCOUNTER — Telehealth (INDEPENDENT_AMBULATORY_CARE_PROVIDER_SITE_OTHER): Payer: Self-pay | Admitting: Orthopaedic Surgery

## 2017-05-26 NOTE — Telephone Encounter (Signed)
I [facility] but, could not release records as they did not send authorization. I faxed to them advising they need to send authorization.

## 2017-05-31 ENCOUNTER — Ambulatory Visit: Payer: 59

## 2017-05-31 DIAGNOSIS — Z96641 Presence of right artificial hip joint: Secondary | ICD-10-CM

## 2017-05-31 DIAGNOSIS — M25651 Stiffness of right hip, not elsewhere classified: Secondary | ICD-10-CM

## 2017-05-31 DIAGNOSIS — M6281 Muscle weakness (generalized): Secondary | ICD-10-CM

## 2017-05-31 NOTE — Therapy (Signed)
[facility] [address] [place], [place], [address] Phone: [phone]   Fax:  [phone]  Physical Therapy Treatment  Patient Details  Name: Emily SaverLeslie R Alverio MRN: [id number] Date of Birth: 05/02/1970 Referring Provider: Doneen Poissonhristopher Blackman  MD   Encounter Date: 05/31/2017  PT End of Session - 05/31/17 1535    Visit Number  3    Number of Visits  17    Date for PT Re-Evaluation  07/08/17    PT Start Time  0300    PT Stop Time  0345    PT Time Calculation (min)  45 min    Activity Tolerance  Patient tolerated treatment well    Behavior During Therapy  [facility] for tasks assessed/performed       Past Medical History:  Diagnosis Date  . Arthritis   . Asthma   . Diabetes mellitus without complication (HCC)    type   . Hypertension   . Interstitial cystitis   . IUD (intrauterine device) in place placed 6 weeks ago  . Migraines     Past Surgical History:  Procedure Laterality Date  . DILATION AND EVACUATION  09/30/2011   Procedure: DILATATION AND EVACUATION;  Surgeon: Anieya AndreaJames E Tomblin II, MD;  Location: [facility];  Service: Gynecology;  Laterality: N/A;  . left knee surgery      arthroscopy   . TOTAL HIP ARTHROPLASTY Right 03/11/2017   Procedure: RIGHT TOTAL HIP ARTHROPLASTY ANTERIOR APPROACH;  Surgeon: Kathryne HitchBlackman, Christopher Y, MD;  Location: [facility];  Service: Orthopedics;  Laterality: Right;    There were no vitals filed for this visit.  Subjective Assessment - 05/31/17 1508    Subjective  She feels like she is regressing with pain and swelling.   Called MD but haveto wait as [place]    Pain Score  4     Pain Location  Hip    Pain Orientation  Right    Pain Type  Chronic pain    Pain Onset  More than a month ago    Pain Frequency  Constant                      [facility] Adult PT Treatment/Exercise - 05/31/17 0001      Knee/Hip Exercises: Aerobic   Nustep  L4 6 min LE      Knee/Hip Exercises: Standing    Other Standing Knee Exercises  facing wall with reach of RT and Lt hand and both  and stand on RT foot with glute contraciton Rt and to left with RT leg lift abduciton x 12 reps each      Knee/Hip Exercises: Supine   Other Supine Knee/Hip Exercises  PPT x 5  then Bridge x 20 with PPT  , then bent knee raise x10 reps Rt/Lt  with cue for abdominals t assist,  then single leg clam red band x 10 each, then       Knee/Hip Exercises: Sidelying   Clams  red band x 12       Moist Heat Therapy   Moist Heat Location  Hip RT               PT Short Term Goals - 05/31/17 1538      PT SHORT TERM GOAL #1   Title  She willl be independent with iniital HEP    Status  Achieved      PT SHORT TERM GOAL #3   Title  pain will  ease 30 % or more     Status  On-going        PT Long Term Goals - 05/12/17 1442      PT LONG TERM GOAL #1   Title  He will be independent with all HEP issued    Time  8    Period  Weeks    Status  New      PT LONG TERM GOAL #2   Title  She will report no pain with general community walking without device    Time  8    Period  Weeks    Status  New      PT LONG TERM GOAL #3   Title  She will be able to walk 12 steps with one rail step over step.     Time  8    Period  Weeks    Status  New      PT LONG TERM GOAL #4   Title  She will be able to   walk without compensation to be able to return to work.     Time  8    Period  Weeks    Status  New      PT LONG TERM GOAL #5   Title  , 40% limited to demo functional improvement    Baseline  59% limited    Time  8    Period  Weeks    Status  New      Additional Long Term Goals   Additional Long Term Goals  Yes      PT LONG TERM GOAL #6   Title  RT hip strength improove to 4/5 or bette to porgress to walking without device    Time  8    Period  Weeks    Status  New            Plan - 05/31/17 1536    Clinical Impression Statement  Reports incr pain and swelling but able to do exercises . She  was more sore after 30 min and asked to stop and put heat on hip as this eases pain.       PT Treatment/Interventions  Cryotherapy;Therapeutic exercise;Therapeutic activities;Patient/family education;Functional mobility training;Manual techniques;Taping;Passive range of motion;Gait training;Stair training    PT Next Visit Plan  HEP , strengthening, modalities as needed    PT Home Exercise Plan  hip abduction ,clam, bridge, bent knee lift    Consulted and Agree with Plan of Care  Patient       Patient will benefit from skilled therapeutic intervention in order to improve the following deficits and impairments:  Pain, Decreased activity tolerance, Decreased endurance, Decreased strength, Difficulty walking  Visit Diagnosis: H/O total hip arthroplasty, right  Muscle weakness (generalized)  Stiffness of right hip, not elsewhere classified     Problem List Patient Active Problem List   Diagnosis Date Noted  . 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  . Unilateral primary osteoarthritis, right hip 01/27/2017    Caprice Red PT  05/31/2017, 3:39 PM  [facility] [address] [place], [place], [address] Phone: [phone]   Fax:  [phone]  Name: CHYANE GREER MRN: [id number] Date of Birth: 1970/03/30

## 2017-06-01 ENCOUNTER — Other Ambulatory Visit (INDEPENDENT_AMBULATORY_CARE_PROVIDER_SITE_OTHER): Payer: Self-pay | Admitting: Physician Assistant

## 2017-06-01 ENCOUNTER — Telehealth (INDEPENDENT_AMBULATORY_CARE_PROVIDER_SITE_OTHER): Payer: Self-pay | Admitting: Orthopaedic Surgery

## 2017-06-01 MED ORDER — OXYCODONE-ACETAMINOPHEN 5-325 MG PO TABS: 1.0000 | ORAL_TABLET | Freq: Four times a day (QID) | ORAL | 0 refills | Status: DC | PRN

## 2017-06-01 NOTE — Telephone Encounter (Signed)
Patient called asking for a refill on her oxycodone. CB # [phone]

## 2017-06-01 NOTE — Telephone Encounter (Signed)
Please advise 

## 2017-06-03 ENCOUNTER — Encounter: Payer: 59 | Admitting: Physical Therapy

## 2017-06-07 ENCOUNTER — Ambulatory Visit: Payer: 59

## 2017-06-07 ENCOUNTER — Encounter (INDEPENDENT_AMBULATORY_CARE_PROVIDER_SITE_OTHER): Payer: Self-pay

## 2017-06-07 DIAGNOSIS — M25651 Stiffness of right hip, not elsewhere classified: Secondary | ICD-10-CM

## 2017-06-07 DIAGNOSIS — R262 Difficulty in walking, not elsewhere classified: Secondary | ICD-10-CM

## 2017-06-07 DIAGNOSIS — Z96641 Presence of right artificial hip joint: Secondary | ICD-10-CM | POA: Diagnosis not present

## 2017-06-07 DIAGNOSIS — M6281 Muscle weakness (generalized): Secondary | ICD-10-CM

## 2017-06-07 NOTE — Therapy (Signed)
[facility] [address] [place], [place], [address] Phone: [phone]   Fax:  [phone]  Physical Therapy Treatment  Patient Details  Name: Emily SaverLeslie R Voisin MRN: [id number] Date of Birth: 02/08/1971 Referring Provider: Doneen Poissonhristopher Blackman  MD   Encounter Date: 06/07/2017  PT End of Session - 06/07/17 1505    Visit Number  4    Number of Visits  17    Date for PT Re-Evaluation  07/08/17    PT Start Time  0300    PT Stop Time  0345    PT Time Calculation (min)  45 min    Activity Tolerance  Patient tolerated treatment well    Behavior During Therapy  [facility] for tasks assessed/performed       Past Medical History:  Diagnosis Date  . Arthritis   . Asthma   . Diabetes mellitus without complication (HCC)    type   . Hypertension   . Interstitial cystitis   . IUD (intrauterine device) in place placed 6 weeks ago  . Migraines     Past Surgical History:  Procedure Laterality Date  . DILATION AND EVACUATION  09/30/2011   Procedure: DILATATION AND EVACUATION;  Surgeon: Iya AndreaJames E Tomblin II, MD;  Location: [facility];  Service: Gynecology;  Laterality: N/A;  . left knee surgery      arthroscopy   . TOTAL HIP ARTHROPLASTY Right 03/11/2017   Procedure: RIGHT TOTAL HIP ARTHROPLASTY ANTERIOR APPROACH;  Surgeon: Kathryne HitchBlackman, Christopher Y, MD;  Location: [facility];  Service: Orthopedics;  Laterality: Right;    There were no vitals filed for this visit.  Subjective Assessment - 06/07/17 1502    Subjective  no changes Pain 5/10.   To MD next week    Pain Score  5     Pain Location  Hip    Pain Orientation  Right    Pain Descriptors / Indicators  Aching    Pain Type  Chronic pain    Pain Onset  More than a month ago    Pain Frequency  Constant    Aggravating Factors   activity    Pain Relieving Factors  chang postion                No data recorded       [facility] Adult PT Treatment/Exercise - 06/07/17 0001      Knee/Hip Exercises: Aerobic   Nustep  L4 6 min LE      Knee/Hip Exercises: Supine   Bridges  20 reps    Other Supine Knee/Hip Exercises  PPT with ball squeeze x 20 then x 10 with 5 sec hold, then clam with green band x 20 with PPT, then clam green band x 20 then single leg clam x 10 each with green band.       Manual Therapy   Manual Therapy  Soft tissue mobilization;Manual Lymphatic Drainage (MLD);Taping    Soft tissue mobilization  to anterio muscle groups in groinn    Manual Lymphatic Drainage (MLD)  retro grade maassage to RT thigh and groind     Kinesiotex  Edema      Kinesiotix   Edema  and pain wiith strip to ant hip and 2  fans over lateral proximal thigh in area of swellin                 PT Short Term Goals - 06/07/17 1505      PT SHORT TERM GOAL #1  Title  She willl be independent with iniital HEP    Status  Achieved      PT SHORT TERM GOAL #3   Title  pain will ease 30 % or more     Status  On-going        PT Long Term Goals - 05/12/17 1442      PT LONG TERM GOAL #1   Title  He will be independent with all HEP issued    Time  8    Period  Weeks    Status  New      PT LONG TERM GOAL #2   Title  She will report no pain with general community walking without device    Time  8    Period  Weeks    Status  New      PT LONG TERM GOAL #3   Title  She will be able to walk 12 steps with one rail step over step.     Time  8    Period  Weeks    Status  New      PT LONG TERM GOAL #4   Title  She will be able to   walk without compensation to be able to return to work.     Time  8    Period  Weeks    Status  New      PT LONG TERM GOAL #5   Title  , 40% limited to demo functional improvement    Baseline  59% limited    Time  8    Period  Weeks    Status  New      Additional Long Term Goals   Additional Long Term Goals  Yes      PT LONG TERM GOAL #6   Title  RT hip strength improove to 4/5 or bette to porgress to walking without device    Time   8    Period  Weeks    Status  New            Plan - 06/07/17 1505    Clinical Impression Statement  Trial of kineseotape with instructions to remove if at all irritating.   Continue to work strength and pain  . Swelling    PT Treatment/Interventions  Cryotherapy;Therapeutic exercise;Therapeutic activities;Patient/family education;Functional mobility training;Manual techniques;Taping;Passive range of motion;Gait training;Stair training    PT Next Visit Plan  HEP , strengthening, modalities as needed tape and manual for pain and swelling    PT Home Exercise Plan  hip abduction ,clam, bridge, bent knee lift    Consulted and Agree with Plan of Care  Patient       Patient will benefit from skilled therapeutic intervention in order to improve the following deficits and impairments:  Pain, Decreased activity tolerance, Decreased endurance, Decreased strength, Difficulty walking  Visit Diagnosis: H/O total hip arthroplasty, right  Muscle weakness (generalized)  Stiffness of right hip, not elsewhere classified  Difficulty in walking, not elsewhere classified     Problem List Patient Active Problem List   Diagnosis Date Noted  . 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  . Unilateral primary osteoarthritis, right hip 01/27/2017      Caprice Red PT 06/07/2017, 3:45 PM  [facility] [address] [place], [place], [address] Phone: [phone]   Fax:  [phone]  Name: Emily Hester MRN: [id number] Date of Birth: 1970/05/02

## 2017-06-09 ENCOUNTER — Ambulatory Visit: Payer: 59

## 2017-06-09 DIAGNOSIS — R262 Difficulty in walking, not elsewhere classified: Secondary | ICD-10-CM

## 2017-06-09 DIAGNOSIS — Z96641 Presence of right artificial hip joint: Secondary | ICD-10-CM

## 2017-06-09 DIAGNOSIS — M25651 Stiffness of right hip, not elsewhere classified: Secondary | ICD-10-CM

## 2017-06-09 DIAGNOSIS — Z96642 Presence of left artificial hip joint: Secondary | ICD-10-CM

## 2017-06-09 DIAGNOSIS — M6281 Muscle weakness (generalized): Secondary | ICD-10-CM

## 2017-06-09 NOTE — Therapy (Addendum)
[place], [place], [address] Phone: [phone]   Fax:  [phone]  Physical Therapy Treatment/Discharge  Patient Details  Name: Emily Hester MRN: [id number] Date of Birth: 1970/12/19 Referring Provider: Jean Rosenthal  MD   Encounter Date: 06/09/2017  PT End of Session - 06/09/17 1510    Visit Number  5    Number of Visits  17    Date for PT Re-Evaluation  07/08/17    PT Start Time  0300    PT Stop Time  0350    PT Time Calculation (min)  50 min    Activity Tolerance  Patient tolerated treatment well    Behavior During Therapy  [facility] for tasks assessed/performed       Past Medical History:  Diagnosis Date  . Arthritis   . Asthma   . Diabetes mellitus without complication ([place])    type   . Hypertension   . Interstitial cystitis   . IUD (intrauterine device) in place placed 6 weeks ago  . Migraines     Past Surgical History:  Procedure Laterality Date  . DILATION AND EVACUATION  09/30/2011   Procedure: DILATATION AND EVACUATION;  Surgeon: Allena Katz, MD;  Location: [facility];  Service: Gynecology;  Laterality: N/A;  . left knee surgery      arthroscopy   . TOTAL HIP ARTHROPLASTY Right 03/11/2017   Procedure: RIGHT TOTAL HIP ARTHROPLASTY ANTERIOR APPROACH;  Surgeon: Mcarthur Rossetti, MD;  Location: [facility];  Service: Orthopedics;  Laterality: Right;    There were no vitals filed for this visit.  Subjective Assessment - 06/09/17 1506    Subjective  No changes Tape did not bother    Currently in Pain?  Yes    Pain Score  5     Pain Location  Hip    Pain Orientation  Right    Pain Descriptors / Indicators  Aching    Pain Type  Chronic pain    Pain Onset  More than a month ago    Pain Frequency  Constant    Aggravating Factors   activity    Multiple Pain Sites  No                No data recorded       [facility] Adult PT Treatment/Exercise - 06/09/17  0001      Knee/Hip Exercises: Supine   Bridges  20 reps    Bridges with [employer]  20 reps    Bridges with [employer]  20 reps;Both;2 sets    Other Supine Knee/Hip Exercises  Marching x 20 reps RT/LT       Knee/Hip Exercises: Sidelying   Clams  x12 with 10 sec hold cued to not roll pelvis back      Knee/Hip Exercises: Prone   Straight Leg Raises  Right;Left;3 sets;10 reps      Modalities   Modalities  Cryotherapy      Moist Heat Therapy   Number Minutes Moist Heat  10 Minutes    Moist Heat Location  Hip RT      Manual Therapy   Manual therapy comments  stretch into extenison with quad stretch.     Manual Lymphatic Drainage (MLD)  retro grade maassage to RT thigh and groin with roller followed by SAQ 10 sec x10  after each roller set  and 3 reps deep breathes  PT Short Term Goals - 06/09/17 1555      PT SHORT TERM GOAL #1   Title  She willl be independent with iniital HEP    Status  Achieved      PT SHORT TERM GOAL #3   Title  pain will ease 30 % or more     Status  On-going        PT Long Term Goals - 06/09/17 1555      PT LONG TERM GOAL #1   Title  He will be independent with all HEP issued    Status  On-going      PT LONG TERM GOAL #2   Title  She will report no pain with general community walking without device    Status  On-going      PT LONG TERM GOAL #3   Title  She will be able to walk 12 steps with one rail step over step.     Status  On-going      PT LONG TERM GOAL #4   Title  She will be able to   walk without compensation to be able to return to work.     Status  On-going      PT LONG TERM GOAL #5   Title  , 40% limited to demo functional improvement    Status  On-going      PT LONG TERM GOAL #6   Title  RT hip strength improve to 4/5 or better to progress to walking without device    Status  On-going            Plan - 06/09/17 1552    Clinical Impression Statement  She continues with moderate pain and  swelling in RT hip . She sontinues with weakness RT hip needing SPC for safety.    She  does well with exercises here working hard. She will see Dr Ninfa Linden  then  we will resume PT progress ing strength as able .   Pain is a problem but weakness is the major factor limiting progress.    PT Treatment/Interventions  Cryotherapy;Therapeutic exercise;Therapeutic activities;Patient/family education;Functional mobility training;Manual techniques;Taping;Passive range of motion;Gait training;Stair training    PT Next Visit Plan  HEP , strengthening, modalities as needed tape and manual for pain and swelling    PT Home Exercise Plan  hip abduction ,clam, bridge, bent knee lift    Consulted and Agree with Plan of Care  Patient       Patient will benefit from skilled therapeutic intervention in order to improve the following deficits and impairments:  Pain, Decreased activity tolerance, Decreased endurance, Decreased strength, Difficulty walking  Visit Diagnosis: H/O total hip arthroplasty, right  Muscle weakness (generalized)  Stiffness of right hip, not elsewhere classified  Difficulty in walking, not elsewhere classified  H/O total hip arthroplasty, left     Problem List Patient Active Problem List   Diagnosis Date Noted  . 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  . Unilateral primary osteoarthritis, right hip 01/27/2017    Darrel Hoover  PT 06/09/2017, 3:57 PM  [facility] [address] [place], [place], [address] Phone: [phone]   Fax:  [phone]  Name: Emily Hester MRN: [id number] Date of Birth: [birth year]  PHYSICAL THERAPY DISCHARGE SUMMARY  Visits from Start of Care: 5  Current functional level related to goals / functional outcomes: See above . Emily Hester called to cancel all appointments due to losing  her insurance   Remaining deficits: See above   Education /  Equipment: HEP Plan: Patient agrees to discharge.  Patient goals were partially met. Patient is being discharged due to financial reasons.  ?????   Pearson Forster PT  06/20/17   10:09

## 2017-06-14 ENCOUNTER — Ambulatory Visit: Payer: 59

## 2017-06-15 ENCOUNTER — Ambulatory Visit (INDEPENDENT_AMBULATORY_CARE_PROVIDER_SITE_OTHER): Payer: 59 | Admitting: Orthopaedic Surgery

## 2017-06-15 ENCOUNTER — Telehealth (INDEPENDENT_AMBULATORY_CARE_PROVIDER_SITE_OTHER): Payer: Self-pay | Admitting: Orthopaedic Surgery

## 2017-06-15 ENCOUNTER — Ambulatory Visit (INDEPENDENT_AMBULATORY_CARE_PROVIDER_SITE_OTHER): Payer: 59

## 2017-06-15 ENCOUNTER — Encounter (INDEPENDENT_AMBULATORY_CARE_PROVIDER_SITE_OTHER): Payer: Self-pay | Admitting: Orthopaedic Surgery

## 2017-06-15 DIAGNOSIS — Z96641 Presence of right artificial hip joint: Secondary | ICD-10-CM

## 2017-06-15 MED ORDER — GABAPENTIN 100 MG PO CAPS: 100.0000 mg | ORAL_CAPSULE | Freq: Three times a day (TID) | ORAL | 0 refills | Status: DC

## 2017-06-15 MED ORDER — NABUMETONE 750 MG PO TABS: 750.0000 mg | ORAL_TABLET | Freq: Two times a day (BID) | ORAL | 1 refills | Status: DC | PRN

## 2017-06-15 NOTE — Progress Notes (Signed)
The patient is now 3 months status post a right total hip arthroplasty through direct anterior approach.  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  She is ambulate with a cane and still having slow progress with hip pain and burning sensation.  She is a morbidly obese individual as well.  Her job has let her go she states.  On exam I can easily put her right hip the range of motion with minimal pain and discomfort.  Her incision is healed nicely and there is no evidence of infection.  AP pelvis and lateral right hip show no acute findings or complicating features.  At this point I agree with her being in physical therapy to work on her balance coordination and strengthening.  I will send in Relafen as an anti-inflammatory as well as Neurontin for the burning sensation and pain.  We will see her back in 4 weeks to see how she is doing overall but no x-rays are needed.

## 2017-06-15 NOTE — Telephone Encounter (Signed)
Patient called wanting to speak with you to see if you have received a fax from [facility] long term. CB # [phone]

## 2017-06-16 ENCOUNTER — Ambulatory Visit: Payer: 59

## 2017-06-21 ENCOUNTER — Encounter: Payer: 59 | Admitting: Physical Therapy

## 2017-06-30 ENCOUNTER — Other Ambulatory Visit (INDEPENDENT_AMBULATORY_CARE_PROVIDER_SITE_OTHER): Payer: Self-pay | Admitting: Orthopaedic Surgery

## 2017-06-30 NOTE — Telephone Encounter (Signed)
Please advise 

## 2017-07-08 ENCOUNTER — Encounter

## 2017-07-12 ENCOUNTER — Other Ambulatory Visit (INDEPENDENT_AMBULATORY_CARE_PROVIDER_SITE_OTHER): Payer: Self-pay | Admitting: Orthopaedic Surgery

## 2017-07-12 NOTE — Telephone Encounter (Signed)
Please advise 

## 2017-07-16 ENCOUNTER — Other Ambulatory Visit (INDEPENDENT_AMBULATORY_CARE_PROVIDER_SITE_OTHER): Payer: Self-pay | Admitting: Orthopaedic Surgery

## 2017-07-18 ENCOUNTER — Encounter (INDEPENDENT_AMBULATORY_CARE_PROVIDER_SITE_OTHER): Payer: Self-pay | Admitting: Orthopaedic Surgery

## 2017-07-18 ENCOUNTER — Ambulatory Visit (INDEPENDENT_AMBULATORY_CARE_PROVIDER_SITE_OTHER): Payer: PRIVATE HEALTH INSURANCE | Admitting: Orthopaedic Surgery

## 2017-07-18 DIAGNOSIS — Z96641 Presence of right artificial hip joint: Secondary | ICD-10-CM

## 2017-07-18 NOTE — Progress Notes (Signed)
The patient is now 4 months status post a right total hip arthroplasty.  She is been slow down her hip pain as well as dealing with left knee pain.  The right hip is replaced.  On exam I can move both hips fluidly for the right hip is tender from her surgery in general.  There is no complicating features otherwise.  She is definitely candidate for outpatient physical therapy to work on bilateral hip strengthening and bilateral quad strengthening.  I think this would help with her gait and help her in general.  She still using a cane and like her to be able to eventually be rid of that.  She still having burning sensation as well.  She is on Neurontin still.  I think she is to continue this.  Hopefully therapy can provide any modalities to decrease trochanteric bursitis pain on the right side and IT band pain.  I will see her back in a month to see how therapy is done for her.

## 2017-07-18 NOTE — Telephone Encounter (Signed)
Please advise 

## 2017-07-19 ENCOUNTER — Other Ambulatory Visit (INDEPENDENT_AMBULATORY_CARE_PROVIDER_SITE_OTHER): Payer: Self-pay

## 2017-07-19 DIAGNOSIS — Z96641 Presence of right artificial hip joint: Secondary | ICD-10-CM

## 2017-08-11 ENCOUNTER — Encounter: Payer: Self-pay | Admitting: Physical Therapy

## 2017-08-11 ENCOUNTER — Ambulatory Visit: Payer: 59 | Attending: Orthopaedic Surgery | Admitting: Physical Therapy

## 2017-08-11 DIAGNOSIS — M25651 Stiffness of right hip, not elsewhere classified: Secondary | ICD-10-CM | POA: Diagnosis present

## 2017-08-11 DIAGNOSIS — Z96641 Presence of right artificial hip joint: Secondary | ICD-10-CM | POA: Insufficient documentation

## 2017-08-11 DIAGNOSIS — M6281 Muscle weakness (generalized): Secondary | ICD-10-CM | POA: Insufficient documentation

## 2017-08-11 DIAGNOSIS — R262 Difficulty in walking, not elsewhere classified: Secondary | ICD-10-CM | POA: Insufficient documentation

## 2017-08-11 NOTE — Therapy (Signed)
[facility] [address] [place], [place], [address] Phone: [phone]   Fax:  [phone]  Physical Therapy Evaluation  Patient Details  Name: Emily Hester MRN: [id number] Date of Birth: [birth year] Referring Provider: Allie Bossier, MD   Encounter Date: 08/11/2017  PT End of Session - 08/11/17 0951    Visit Number  1    Number of Visits  16 insurance limits to 10 visits    Date for PT Re-Evaluation  10/11/17    Authorization Type  Pt's insurance is limiting pt to 10 visits and due to high co-pay pt will need to start out with 1x/week.     PT Start Time  0935    PT Stop Time  1015    PT Time Calculation (min)  40 min    Activity Tolerance  Patient tolerated treatment well    Behavior During Therapy  [facility] for tasks assessed/performed       Past Medical History:  Diagnosis Date  . Arthritis   . Asthma   . Diabetes mellitus without complication (HCC)    type   . Hypertension   . Interstitial cystitis   . IUD (intrauterine device) in place placed 6 weeks ago  . Migraines     Past Surgical History:  Procedure Laterality Date  . DILATION AND EVACUATION  09/30/2011   Procedure: DILATATION AND EVACUATION;  Surgeon: Xavia Andrea, MD;  Location: [facility];  Service: Gynecology;  Laterality: N/A;  . left knee surgery      arthroscopy   . TOTAL HIP ARTHROPLASTY Right 03/11/2017   Procedure: RIGHT TOTAL HIP ARTHROPLASTY ANTERIOR APPROACH;  Surgeon: Kathryne Hitch, MD;  Location: [facility];  Service: Orthopedics;  Laterality: Right;    There were no vitals filed for this visit.   Subjective Assessment - 08/11/17 0944    Subjective  Pt arriving to therapy today for PT evaluation. Pt reporting 1/10 pain today but reports her pain can get 4/10 at times. Pt s/p R THA 03/11/18 and pt still reporting weakness and inability to perform certain daily activities such as stair climbing, curb stepping, bending, unable to  clean her bathrooms, standing for long periods increases her pain. Pt also reported still some pain and difficulty getting into and out of car.     Pertinent History  DM, Asthma    Limitations  Walking;Standing;House hold activities    How long can you sit comfortably?  60 min then stands    How long can you stand comfortably?  15 min    How long can you walk comfortably?  15 minutes, pt uses a cane occassionally when hurting and when walking community distances and surfaces    Patient Stated Goals  She wants to get stronger and have less pain, mobility difficulty.       Currently in Pain?  Yes    Pain Score  1     Pain Location  Hip    Pain Orientation  Right    Pain Descriptors / Indicators  Aching    Pain Type  Chronic pain    Aggravating Factors   activities, bending, standing, walking, getting in and out of car    Pain Relieving Factors  change positions, rest, ice, pain meds as needed.          [facility] PT Assessment - 08/11/17 0001      Assessment   Medical Diagnosis  R THA    Referring Provider  Thayer Ohm  Magnus Ivan, MD    Onset Date/Surgical Date  03/11/17    Next MD Visit  08/15/17    Prior Therapy  pt was seen 3 months ago for Outpt PT but had to stop due to change in insurance      Precautions   Precautions  None      Restrictions   Weight Bearing Restrictions  No      Balance Screen   Has the patient fallen in the past 6 months  No    Is the patient reluctant to leave their home because of a fear of falling?   No      Home Environment   Additional Comments  Pt lives in one story home with basement, 20 steps to get into basement to do her laundry, 5 steps to get into house with rail on right      Prior Function   Level of Independence  Independent with basic ADLs pt still has to sit to put on shoes/socks and pants    Vocation  Unemployed    Vocation Requirements  pt use to work for [employer], but currently is unable to work     Leisure  spend time with family       Cognition   Overall Cognitive Status  Within Functional Limits for tasks assessed      Observation/Other Assessments   Focus on Therapeutic Outcomes (FOTO)   59% limitation      ROM / Strength   AROM / PROM / Strength  AROM;Strength      Strength   Right/Left Hip  Right    Right Hip Flexion  4-/5    Right Hip Extension  4/5    Right Hip External Rotation   4-/5    Right Hip Internal Rotation  4-/5    Right Hip ABduction  4-/5    Left Hip Flexion  5/5    Left Hip Extension  5/5    Left Hip External Rotation  5/5    Left Hip Internal Rotation  5/5    Left Hip ABduction  5/5      Flexibility   Hamstrings  78 degrees R hamstring, L hamstring: 95 degrees                Objective measurements completed on examination: See above findings.              PT Education - 08/11/17 0949    Education provided  Yes    Education Details  HEP, discussed aquatic therapy at her local pool    Person(s) Educated  Patient    Methods  Explanation;Demonstration;Handout    Comprehension  Verbalized understanding;Returned demonstration       PT Short Term Goals - 08/11/17 0952      PT SHORT TERM GOAL #1   Title  She willl be independent with initial HEP    Time  3    Period  Weeks    Status  New    Target Date  09/01/17      PT SHORT TERM GOAL #2   Title  Pt will be able to perform step over step using single hand rail on 6 inch steps.     Time  3    Period  Weeks    Status  New    Target Date  09/01/17      PT SHORT TERM GOAL #3   Title  -        PT  Long Term Goals - 08/11/17 0954      PT LONG TERM GOAL #1   Title  Pt will be able to improve her R LE strength to >/= 4+/5.     Time  8    Period  Weeks    Status  New      PT LONG TERM GOAL #2   Title  She will report no pain with general community walking without device    Period  Weeks    Status  New      PT LONG TERM GOAL #3   Title  She will be able to walk 12 steps with one rail step over  step pattern.     Time  8    Period  Weeks    Status  New      PT LONG TERM GOAL #4   Title  Pt will improve her FOTO score from 59% limitation to 44% limitation.     Time  8    Period  Weeks    Status  New      PT LONG TERM GOAL #5   Title  pt will be able to get in and out of a car without pain.     Time  8    Period  Weeks    Status  New      PT LONG TERM GOAL #6   Title  -             Plan - 08/11/17 1018    Clinical Impression Statement  Pt arriving to therapy after 3 months of working on her own since she changed insurances and had to stop coming. Pt now on her husband's insurance and has a 10 visit limitation. Pt presenting today folllowing THA on 03/11/17 still with weakness in her R hip and pain reported with walking, standing, bending and car transfers. Skilled PT needed to progress pt towrad her PLOF. Pt's ultimate goal is to return to work.     History and Personal Factors relevant to plan of care:  chronic hip pain for 1 year prior to surgery, abnormal swelling and drainage following surgery, obesity, DM, asthma    Clinical Presentation  Stable    Clinical Decision Making  Low    Rehab Potential  Good    PT Frequency  2x / week    PT Duration  8 weeks    PT Treatment/Interventions  Cryotherapy;Therapeutic exercise;Therapeutic activities;Patient/family education;Functional mobility training;Manual techniques;Taping;Passive range of motion;Gait training;Stair training;Dry needling;Balance training;Moist Heat;Electrical Stimulation    PT Next Visit Plan  HEP , strengthening, modalities as needed    PT Home Exercise Plan  hip abduction, hip extension, SLR, hamstring stretch    Consulted and Agree with Plan of Care  Patient       Patient will benefit from skilled therapeutic intervention in order to improve the following deficits and impairments:  Pain, Decreased activity tolerance, Decreased endurance, Decreased strength, Difficulty walking  Visit Diagnosis: H/O  total hip arthroplasty, right  Muscle weakness (generalized)  Stiffness of right hip, not elsewhere classified  Difficulty in walking, not elsewhere classified     Problem List Patient Active Problem List   Diagnosis Date Noted  . 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  . Unilateral primary osteoarthritis, right hip 01/27/2017    Sharmon Leyden, MPT 08/11/2017, 11:01 AM  [facility] [address] [place], [place], [address] Phone: [phone]   Fax:  [phone]  Name: Emily Hester MRN: [id number] Date of Birth: May 23, 1970

## 2017-08-15 ENCOUNTER — Ambulatory Visit: Payer: 59 | Admitting: Physical Therapy

## 2017-08-15 ENCOUNTER — Encounter (INDEPENDENT_AMBULATORY_CARE_PROVIDER_SITE_OTHER): Payer: Self-pay | Admitting: Orthopaedic Surgery

## 2017-08-15 ENCOUNTER — Ambulatory Visit (INDEPENDENT_AMBULATORY_CARE_PROVIDER_SITE_OTHER): Payer: PRIVATE HEALTH INSURANCE | Admitting: Orthopaedic Surgery

## 2017-08-15 DIAGNOSIS — Z96641 Presence of right artificial hip joint: Secondary | ICD-10-CM | POA: Diagnosis not present

## 2017-08-15 MED ORDER — TIZANIDINE HCL 4 MG PO CAPS: 4.0000 mg | ORAL_CAPSULE | Freq: Three times a day (TID) | ORAL | 1 refills | Status: DC | PRN

## 2017-08-15 MED ORDER — GABAPENTIN 100 MG PO CAPS: 200.0000 mg | ORAL_CAPSULE | Freq: Three times a day (TID) | ORAL | 3 refills | Status: DC

## 2017-08-15 NOTE — Progress Notes (Signed)
HPI: Emily Hester returns now 5 months status post right total hip arthroplasty.  She continues to have burning pain in her right thigh area.  She states that she is overall getting better.  She is no longer using a cane.  She is working with therapy doing water aerobics all in attempts to strengthen her right quad muscles.  Physical exam: Right thigh surgical incisions well-healed.  No signs of infection no seroma abnormal warmth or erythema.  She is overall good range of motion of the right hip without significant pain.  She walks without an antalgic gait.  Leg lengths appear equal.  Impression: 5 months status post right total hip arthroplasty Paresthesia postop right thigh.    Plan: We will increase her Neurontin to 200 mg 3 times a day.  Vascular to pick up some vitamin D 600 mg twice daily.  Follow-up with us in 3 months check progress lack of.  Continue to work on Dance movement psychotherapistquad strengthening.

## 2017-08-16 ENCOUNTER — Encounter (INDEPENDENT_AMBULATORY_CARE_PROVIDER_SITE_OTHER): Payer: Self-pay | Admitting: Physician Assistant

## 2017-08-23 ENCOUNTER — Ambulatory Visit: Payer: 59 | Admitting: Physical Therapy

## 2017-08-23 ENCOUNTER — Telehealth (INDEPENDENT_AMBULATORY_CARE_PROVIDER_SITE_OTHER): Payer: Self-pay

## 2017-08-23 NOTE — Telephone Encounter (Signed)
Thank you, I do have the form, but [employer] has not been calling me. The patient called me earlier about it.

## 2017-08-23 NOTE — Telephone Encounter (Signed)
Do you happen to have any paperwork on this patient? [employer] said they had sent a questionnaire for the dr to fill out and they are trying to make sure we received it? They said they have been leaving vm's to check on this since 08/16/17 but have not gotten a response. I thought it was [employer] but it is [employer]. We have nothing here. Can you let me know if you have anything. Thanks

## 2017-08-24 NOTE — Telephone Encounter (Signed)
I think he may have to. They want clarification on restrictions and to know if she can work part time. I can bring it over today.

## 2017-08-24 NOTE — Telephone Encounter (Signed)
Called Monica back and left vm that we do have document and it is waiting on Dr. Magnus IvanBlackman to complete who is out of the office this week

## 2017-08-26 ENCOUNTER — Ambulatory Visit: Payer: 59 | Attending: Orthopaedic Surgery | Admitting: Physical Therapy

## 2017-08-26 ENCOUNTER — Encounter: Payer: Self-pay | Admitting: Physical Therapy

## 2017-08-26 DIAGNOSIS — M6281 Muscle weakness (generalized): Secondary | ICD-10-CM | POA: Diagnosis present

## 2017-08-26 DIAGNOSIS — R262 Difficulty in walking, not elsewhere classified: Secondary | ICD-10-CM | POA: Insufficient documentation

## 2017-08-26 DIAGNOSIS — Z96642 Presence of left artificial hip joint: Secondary | ICD-10-CM | POA: Insufficient documentation

## 2017-08-26 DIAGNOSIS — M25651 Stiffness of right hip, not elsewhere classified: Secondary | ICD-10-CM | POA: Insufficient documentation

## 2017-08-26 DIAGNOSIS — Z96641 Presence of right artificial hip joint: Secondary | ICD-10-CM | POA: Insufficient documentation

## 2017-08-26 NOTE — Therapy (Signed)
[facility] [address] [place], [place], [address] Phone: [phone]   Fax:  [phone]  Physical Therapy Treatment  Patient Details  Name: Emily SaverLeslie R Aldea MRN: [id number] Date of Birth: 01/14/1971 Referring Provider: Allie Bossierhris Blackman, MD   Encounter Date: 08/26/2017  PT End of Session - 08/26/17 1045    Visit Number  2    Date for PT Re-Evaluation  10/11/17    Authorization Type  Pt's insurance is limiting pt to 10 visits and due to high co-pay pt will need to start out with 1x/week.     PT Start Time  1015    PT Stop Time  1110    PT Time Calculation (min)  55 min    Activity Tolerance  Patient tolerated treatment well    Behavior During Therapy  WFL for tasks assessed/performed       Past Medical History:  Diagnosis Date  . Arthritis   . Asthma   . Diabetes mellitus without complication (HCC)    type   . Hypertension   . Interstitial cystitis   . IUD (intrauterine device) in place placed 6 weeks ago  . Migraines     Past Surgical History:  Procedure Laterality Date  . DILATION AND EVACUATION  09/30/2011   Procedure: DILATATION AND EVACUATION;  Surgeon: Jasa AndreaJames E Tomblin II, MD;  Location: [facility];  Service: Gynecology;  Laterality: N/A;  . left knee surgery      arthroscopy   . TOTAL HIP ARTHROPLASTY Right 03/11/2017   Procedure: RIGHT TOTAL HIP ARTHROPLASTY ANTERIOR APPROACH;  Surgeon: Kathryne HitchBlackman, Christopher Y, MD;  Location: [facility];  Service: Orthopedics;  Laterality: Right;    There were no vitals filed for this visit.  Subjective Assessment - 08/26/17 1016    Subjective  Min pain today.  Been doing her exercises at home.  Uses cane in the home when tired. Still has swelling.     Currently in Pain?  Yes    Pain Score  2     Pain Location  Hip    Pain Orientation  Right    Pain Descriptors / Indicators  Discomfort    Pain Type  Chronic pain    Pain Onset  More than a month ago    Pain Frequency   Constant    Aggravating Factors   hardest is stairs, feels weak          [facility] PT Assessment - 08/26/17 0001      Strength   Right/Left Knee  Right;Left    Right Knee Flexion  5/5    Right Knee Extension  4/5    Left Knee Flexion  5/5    Left Knee Extension  5/5           [facility] Adult PT Treatment/Exercise - 08/26/17 0001      Knee/Hip Exercises: Aerobic   Nustep  L5 ,6 min LE      Knee/Hip Exercises: Standing   Hip Abduction  Stengthening;Both;1 set;10 reps    Abduction Limitations  off step cues to unlock knee     Lateral Step Up  Right;1 set;15 reps;Hand Hold: 1;Step Height: 4"    Forward Step Up  Right;1 set;20 reps;Step Height: 2";Step Height: 8"      Knee/Hip Exercises: Seated   Long Arc Quad  Strengthening;Right;2 sets;10 reps 5 lbs neutral and turnout for VMO       Knee/Hip Exercises: Supine   Bridges  20 reps  Straight Leg Raises  Strengthening;1 set;10 reps      Knee/Hip Exercises: Sidelying   Hip ABduction  Strengthening;Right;2 sets;10 reps    Clams  x 20       Knee/Hip Exercises: Prone   Hip Extension  Strengthening;Right;Both;1 set;10 reps    Straight Leg Raises  Strengthening;Both;1 set;10 reps    Other Prone Exercises  bent knee x 10 ans extended knee x 10       Modalities   Modalities  Cryotherapy      Moist Heat Therapy   Moist Heat Location  Hip RT      Cryotherapy   Number Minutes Cryotherapy  10 Minutes    Cryotherapy Location  Hip    Type of Cryotherapy  Ice pack               PT Short Term Goals - 08/11/17 6962      PT SHORT TERM GOAL #1   Title  She willl be independent with initial HEP    Time  3    Period  Weeks    Status  New    Target Date  09/01/17      PT SHORT TERM GOAL #2   Title  Pt will be able to perform step over step using single hand rail on 6 inch steps.     Time  3    Period  Weeks    Status  New    Target Date  09/01/17      PT SHORT TERM GOAL #3   Title  -        PT Long Term Goals -  08/26/17 1045      PT LONG TERM GOAL #1   Title  Pt will be able to improve her R LE strength to >/= 4+/5.     Status  On-going      PT LONG TERM GOAL #2   Title  She will report no pain with general community walking without device    Status  On-going      PT LONG TERM GOAL #3   Title  She will be able to walk 12 steps with one rail step over step pattern.     Status  On-going      PT LONG TERM GOAL #4   Title  Pt will improve her FOTO score from 59% limitation to 44% limitation.     Status  On-going      PT LONG TERM GOAL #5   Title  pt will be able to get in and out of a car without pain.     Status  On-going            Plan - 08/26/17 1036    Clinical Impression Statement  Pt with min pain overall.  Worked on Rt hip and knee strength to impact proper gait. Due to financial situation she may be interested in transitioning to group PT earlier if able.     PT Treatment/Interventions  Cryotherapy;Therapeutic exercise;Therapeutic activities;Patient/family education;Functional mobility training;Manual techniques;Taping;Passive range of motion;Gait training;Stair training;Dry needling;Balance training;Moist Heat;Electrical Stimulation    PT Next Visit Plan  HEP , strengthening, modalities as needed    PT Home Exercise Plan  hip abduction, hip extension, SLR, hamstring stretch    Consulted and Agree with Plan of Care  Patient       Patient will benefit from skilled therapeutic intervention in order to improve the following deficits and impairments:  Pain, Decreased activity tolerance,  Decreased endurance, Decreased strength, Difficulty walking  Visit Diagnosis: H/O total hip arthroplasty, right  Muscle weakness (generalized)  Stiffness of right hip, not elsewhere classified  Difficulty in walking, not elsewhere classified     Problem List Patient Active Problem List   Diagnosis Date Noted  . 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  . Unilateral  primary osteoarthritis, right hip 01/27/2017    Tilman Mcclaren 08/26/2017, 11:03 AM  [facility] [address] [place], [place], [address] Phone: [phone]   Fax:  [phone]  Name: KALEIYAH POLSKY MRN: [id number] Date of Birth: 17-Feb-1971  Karie Mainland, PT 08/26/17 11:04 AM Phone: [phone] Fax: [phone]

## 2017-08-30 ENCOUNTER — Ambulatory Visit: Payer: 59 | Admitting: Physical Therapy

## 2017-08-31 ENCOUNTER — Ambulatory Visit: Payer: 59 | Admitting: Physical Therapy

## 2017-09-05 ENCOUNTER — Encounter: Payer: 59 | Admitting: Physical Therapy

## 2017-09-06 ENCOUNTER — Ambulatory Visit: Payer: 59 | Admitting: Physical Therapy

## 2017-09-06 ENCOUNTER — Encounter: Payer: Self-pay | Admitting: Physical Therapy

## 2017-09-06 DIAGNOSIS — M6281 Muscle weakness (generalized): Secondary | ICD-10-CM

## 2017-09-06 DIAGNOSIS — R262 Difficulty in walking, not elsewhere classified: Secondary | ICD-10-CM

## 2017-09-06 DIAGNOSIS — Z96641 Presence of right artificial hip joint: Secondary | ICD-10-CM | POA: Diagnosis not present

## 2017-09-06 DIAGNOSIS — M25651 Stiffness of right hip, not elsewhere classified: Secondary | ICD-10-CM

## 2017-09-06 DIAGNOSIS — Z96642 Presence of left artificial hip joint: Secondary | ICD-10-CM

## 2017-09-06 NOTE — Therapy (Deleted)
[facility] [address] [place], [place], [address] Phone: [phone]   Fax:  [phone]  Physical Therapy Treatment  Patient Details  Name: Emily Hester MRN: [id number] Date of Birth: 1970/05/09 Referring Provider: Allie Bossier, MD   Encounter Date: 09/06/2017  PT End of Session - 09/06/17 1036    Visit Number  3    Number of Visits  16    Date for PT Re-Evaluation  10/11/17    Authorization Type  Pt's insurance is limiting pt to 10 visits and due to high co-pay pt will need to start out with 1x/week.     PT Start Time  1015    PT Stop Time  1100    PT Time Calculation (min)  45 min       Past Medical History:  Diagnosis Date  . Arthritis   . Asthma   . Diabetes mellitus without complication (HCC)    type   . Hypertension   . Interstitial cystitis   . IUD (intrauterine device) in place placed 6 weeks ago  . Migraines     Past Surgical History:  Procedure Laterality Date  . DILATION AND EVACUATION  09/30/2011   Procedure: DILATATION AND EVACUATION;  Surgeon: Meagon Andrea, MD;  Location: [facility];  Service: Gynecology;  Laterality: N/A;  . left knee surgery      arthroscopy   . TOTAL HIP ARTHROPLASTY Right 03/11/2017   Procedure: RIGHT TOTAL HIP ARTHROPLASTY ANTERIOR APPROACH;  Surgeon: Kathryne Hitch, MD;  Location: [facility];  Service: Orthopedics;  Laterality: Right;    There were no vitals filed for this visit.  Subjective Assessment - 09/06/17 1048    Subjective  Pt reports less pain, however ongoing weakness and increased antalgic gait after prolonged walking.     Currently in Pain?  Yes    Pain Score  2     Pain Location  Hip    Pain Orientation  Right    Pain Descriptors / Indicators  Sharp    Aggravating Factors   stairs , prolonged walking     Pain Relieving Factors  change positions, ice rest         [facility] Adult PT Treatment/Exercise - 09/06/17 0001      Knee/Hip Exercises:  Aerobic   Nustep  L5 ,8 min LE      Knee/Hip Exercises: Standing   Lateral Step Up  Right;1 set;15 reps;Hand Hold: 1;Step Height: 4"    Forward Step Up  10 reps;Hand Hold: 1;Step Height: 4"    Stairs  can ascend and descend 4 reciprocal stairs with 1 HR however demonstrates significant compensations.     SLS  16 sec best     SLS with Vectors  at counter 3 way hpi bilateral for strength and proprioception             PT Education - 09/06/17 1051    Education provided  Yes    Education Details  HEP    Person(s) Educated  Patient    Methods  Explanation;Handout    Comprehension  Verbalized understanding       PT Short Term Goals - 09/06/17 1047      PT SHORT TERM GOAL #1   Title  She willl be independent with initial HEP    Time  3    Period  Weeks    Status  Achieved      PT SHORT TERM GOAL #2  Title  Pt will be able to perform step over step using single hand rail on 6 inch steps.     Baseline  can do it with significant compensations     Time  3    Period  Weeks    Status  On-going        PT Long Term Goals - 09/06/17 1053      PT LONG TERM GOAL #1   Title  Pt will be able to improve her R LE strength to >/= 4+/5.     Baseline  4-/5 to 4/5    Time  8    Period  Weeks    Status  On-going      PT LONG TERM GOAL #2   Title  She will report no pain with general community walking without device    Baseline  up to 5/10    Time  8    Period  Weeks    Status  On-going      PT LONG TERM GOAL #3   Title  She will be able to walk 12 steps with one rail step over step pattern.     Baseline  signifant compensations on 6 inch steps    Time  8    Period  Weeks    Status  On-going      PT LONG TERM GOAL #4   Title  Pt will improve her FOTO score from 59% limitation to 44% limitation.     Time  8    Period  Weeks    Status  Unable to assess      PT LONG TERM GOAL #5   Title  pt will be able to get in and out of a car without pain.     Time  8    Period   Weeks    Status  On-going            Plan - 09/06/17 1034    Clinical Impression Statement  Pt arrives without SPC. She reports using SPC after prolonged activity which causes fatigue and antalgic gait. Encouraged her to take [facility] for prolonged activity to decrease fatigue and limping. Worked on functional strength and updated HEP. Significant compensations needed to ascend and descend 6 inch steps. She has 8 inch or higher at home and avoids using RLE. Due to high co-pay, pt would like to hold PT for 6 weeks while she focused HEP and our group exercise class. She will be re-evaluated at that time.     PT Next Visit Plan  HEP , strengthening, modalities as needed    PT Home Exercise Plan  hip abduction, hip extension, SLR, hamstring stretch, sit-stand, SLS, try to fins small step for step ups     Consulted and Agree with Plan of Care  Patient       Patient will benefit from skilled therapeutic intervention in order to improve the following deficits and impairments:  Pain, Decreased activity tolerance, Decreased endurance, Decreased strength, Difficulty walking  Visit Diagnosis: H/O total hip arthroplasty, right  Stiffness of right hip, not elsewhere classified  Muscle weakness (generalized)  Difficulty in walking, not elsewhere classified  H/O total hip arthroplasty, left     Problem List Patient Active Problem List   Diagnosis Date Noted  . 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  . Unilateral primary osteoarthritis, right hip 01/27/2017    Emily Hester, PTA 09/06/2017, 1:06 PM  [facility] [address] [place], [place], [address] Phone: [phone]   Fax:  [phone]  Name: Emily SaverLeslie R Hester MRN: [id number] Date of Birth: 02/19/1971

## 2017-09-06 NOTE — Patient Instructions (Signed)
Single Leg - Eyes Open    Holding support, lift right leg while maintaining balance over other leg. Progress to removing hands from support surface for longer periods of time. Hold_30___ seconds. Repeat __2__ times per session. Do _2___ sessions per day.  SIT TO STAND: Feet Narrow    Place feet shoulder width apart. Lean chest forward. Raise hips and straighten knees to stand. __10_ reps per set, _2__ sets per day, _7__ days per week

## 2017-09-06 NOTE — Therapy (Signed)
[facility] [address] [place], [place], [address] Phone: [phone]   Fax:  [phone]  Physical Therapy Treatment  Patient Details  Name: Emily Hester MRN: [id number] Date of Birth: 04-26-1970 Referring Provider: Allie Bossier, MD   Encounter Date: 09/06/2017  PT End of Session - 09/06/17 1036    Visit Number  3    Number of Visits  16    Date for PT Re-Evaluation  10/11/17    Authorization Type  Pt's insurance is limiting pt to 10 visits and due to high co-pay pt will need to start out with 1x/week.     PT Start Time  1015    PT Stop Time  1100    PT Time Calculation (min)  45 min       Past Medical History:  Diagnosis Date  . Arthritis   . Asthma   . Diabetes mellitus without complication (HCC)    type   . Hypertension   . Interstitial cystitis   . IUD (intrauterine device) in place placed 6 weeks ago  . Migraines     Past Surgical History:  Procedure Laterality Date  . DILATION AND EVACUATION  09/30/2011   Procedure: DILATATION AND EVACUATION;  Surgeon: Shaquille Andrea, MD;  Location: [facility];  Service: Gynecology;  Laterality: N/A;  . left knee surgery      arthroscopy   . TOTAL HIP ARTHROPLASTY Right 03/11/2017   Procedure: RIGHT TOTAL HIP ARTHROPLASTY ANTERIOR APPROACH;  Surgeon: Kathryne Hitch, MD;  Location: [facility];  Service: Orthopedics;  Laterality: Right;    There were no vitals filed for this visit.  Subjective Assessment - 09/06/17 1048    Subjective  Pt reports less pain, however ongoing weakness and increased antalgic gait after prolonged walking.     Currently in Pain?  Yes    Pain Score  2     Pain Location  Hip    Pain Orientation  Right    Pain Descriptors / Indicators  Sharp    Aggravating Factors   stairs , prolonged walking     Pain Relieving Factors  change positions, ice rest          [facility] PT Assessment - 09/06/17 0001      Observation/Other Assessments    Focus on Therapeutic Outcomes (FOTO)   --      Strength   Right Hip Flexion  4-/5    Right Hip Extension  4/5    Right Hip External Rotation   4-/5    Right Hip Internal Rotation  4-/5    Right Hip ABduction  4-/5    Left Hip Flexion  5/5    Left Hip Extension  5/5    Left Hip External Rotation  5/5    Left Hip Internal Rotation  5/5    Left Hip ABduction  5/5    Right Knee Flexion  5/5    Right Knee Extension  4/5    Left Knee Flexion  5/5    Left Knee Extension  5/5      Flexibility   Hamstrings  78 degrees R hamstring, L hamstring: 95 degrees                   [facility] Adult PT Treatment/Exercise - 09/06/17 0001      Knee/Hip Exercises: Aerobic   Nustep  L5 ,8 min LE      Knee/Hip Exercises: Standing   Lateral Step Up  Right;1 set;15 reps;Hand Hold: 1;Step Height: 4"    Forward Step Up  10 reps;Hand Hold: 1;Step Height: 4"    Stairs  can ascend and descend 4 reciprocal stairs with 1 HR however demonstrates significant compensations.     SLS  16 sec best     SLS with Vectors  at counter 3 way hpi bilateral for strength and proprioception      Knee/Hip Exercises: Seated   Sit to Sand  10 reps;without UE support      Knee/Hip Exercises: Supine   Bridges  20 reps    Single Leg Bridge  10 reps      Knee/Hip Exercises: Sidelying   Hip ABduction  Strengthening;Right;2 sets;10 reps    Clams  x 20              PT Education - 09/06/17 1051    Education provided  Yes    Education Details  HEP    Person(s) Educated  Patient    Methods  Explanation;Handout    Comprehension  Verbalized understanding       PT Short Term Goals - 09/06/17 1047      PT SHORT TERM GOAL #1   Title  She willl be independent with initial HEP    Time  3    Period  Weeks    Status  Achieved      PT SHORT TERM GOAL #2   Title  Pt will be able to perform step over step using single hand rail on 6 inch steps.     Baseline  can do it with significant compensations     Time   3    Period  Weeks    Status  On-going        PT Long Term Goals - 09/06/17 1053      PT LONG TERM GOAL #1   Title  Pt will be able to improve her R LE strength to >/= 4+/5.     Baseline  4-/5 to 4/5    Time  8    Period  Weeks    Status  On-going      PT LONG TERM GOAL #2   Title  She will report no pain with general community walking without device    Baseline  up to 5/10    Time  8    Period  Weeks    Status  On-going      PT LONG TERM GOAL #3   Title  She will be able to walk 12 steps with one rail step over step pattern.     Baseline  signifant compensations on 6 inch steps    Time  8    Period  Weeks    Status  On-going      PT LONG TERM GOAL #4   Title  Pt will improve her FOTO score from 59% limitation to 44% limitation.     Time  8    Period  Weeks    Status  Unable to assess      PT LONG TERM GOAL #5   Title  pt will be able to get in and out of a car without pain.     Time  8    Period  Weeks    Status  On-going            Plan - 09/06/17 1034    Clinical Impression Statement  Pt arrives without SPC. She reports using SPC after  prolonged activity which causes fatigue and antalgic gait. [facility] to decrease fatigue and limping. Worked on functional strength and updated HEP. Significant compensations needed to ascend and descend 6 inch steps. She has 8 inch or higher at home and avoids using RLE. Due to high co-pay, pt would like to hold PT for 6 weeks while she focused HEP and our group exercise class. She will be re-evaluated at that time.     PT Next Visit Plan  HEP , strengthening, modalities as needed    PT Home Exercise Plan  hip abduction, hip extension, SLR, hamstring stretch, sit-stand, SLS, try to fins small step for step ups     Consulted and Agree with Plan of Care  Patient       Patient will benefit from skilled therapeutic intervention in order to improve the following deficits and impairments:   Pain, Decreased activity tolerance, Decreased endurance, Decreased strength, Difficulty walking  Visit Diagnosis: H/O total hip arthroplasty, right  Stiffness of right hip, not elsewhere classified  Muscle weakness (generalized)  Difficulty in walking, not elsewhere classified  H/O total hip arthroplasty, left     Problem List Patient Active Problem List   Diagnosis Date Noted  . 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  . Unilateral primary osteoarthritis, right hip 01/27/2017    Sherrie Mustacheonoho, Davyon Fisch McGee, PTA 09/06/2017, 1:09 PM  [facility] [address] [place], [place], [address] Phone: [phone]   Fax:  [phone]  Name: Emily Hester MRN: [id number] Date of Birth: 08/16/1970

## 2017-09-07 ENCOUNTER — Encounter: Payer: 59 | Admitting: Physical Therapy

## 2017-09-12 ENCOUNTER — Other Ambulatory Visit (INDEPENDENT_AMBULATORY_CARE_PROVIDER_SITE_OTHER): Payer: Self-pay | Admitting: Orthopaedic Surgery

## 2017-09-12 ENCOUNTER — Telehealth (INDEPENDENT_AMBULATORY_CARE_PROVIDER_SITE_OTHER): Payer: Self-pay | Admitting: Orthopaedic Surgery

## 2017-09-12 ENCOUNTER — Ambulatory Visit: Payer: 59 | Admitting: Physical Therapy

## 2017-09-12 MED ORDER — GABAPENTIN 300 MG PO CAPS: 300.0000 mg | ORAL_CAPSULE | Freq: Three times a day (TID) | ORAL | 3 refills | Status: DC

## 2017-09-12 NOTE — Telephone Encounter (Signed)
Please advise 

## 2017-09-12 NOTE — Telephone Encounter (Signed)
There is actually nothing else to prescribe.  We can go up on the gabapentin.  She was on just 100 mg 3 times a day and we can increase this to 300 mg 3 times a day.  I will send that into her pharmacy.

## 2017-09-12 NOTE — Telephone Encounter (Signed)
Patient called stating that she is still have a lot of swelling in her hip.  She states that she is taking the Gabapentin, but it does not seem to help.  She wanted to know if there was something else Dr. Magnus IvanBlackman could prescribe for the burning, swelling, and pain.  [phone]

## 2017-09-13 NOTE — Telephone Encounter (Signed)
Patient aware of the below message  

## 2017-09-14 ENCOUNTER — Encounter: Payer: 59 | Admitting: Physical Therapy

## 2017-09-20 ENCOUNTER — Encounter: Payer: 59 | Admitting: Physical Therapy

## 2017-09-22 ENCOUNTER — Encounter: Payer: 59 | Admitting: Physical Therapy

## 2017-09-27 ENCOUNTER — Encounter: Payer: 59 | Admitting: Physical Therapy

## 2017-09-29 ENCOUNTER — Encounter: Payer: 59 | Admitting: Physical Therapy

## 2017-10-04 ENCOUNTER — Encounter: Payer: 59 | Admitting: Physical Therapy

## 2017-10-06 ENCOUNTER — Encounter: Payer: 59 | Admitting: Physical Therapy

## 2017-11-04 ENCOUNTER — Ambulatory Visit: Payer: 59 | Admitting: Physical Therapy

## 2017-11-17 ENCOUNTER — Ambulatory Visit (INDEPENDENT_AMBULATORY_CARE_PROVIDER_SITE_OTHER): Payer: PRIVATE HEALTH INSURANCE | Admitting: Orthopaedic Surgery

## 2017-11-23 ENCOUNTER — Ambulatory Visit (INDEPENDENT_AMBULATORY_CARE_PROVIDER_SITE_OTHER): Payer: PRIVATE HEALTH INSURANCE

## 2017-11-23 ENCOUNTER — Encounter (INDEPENDENT_AMBULATORY_CARE_PROVIDER_SITE_OTHER): Payer: Self-pay | Admitting: Orthopaedic Surgery

## 2017-11-23 ENCOUNTER — Ambulatory Visit (INDEPENDENT_AMBULATORY_CARE_PROVIDER_SITE_OTHER): Payer: PRIVATE HEALTH INSURANCE | Admitting: Orthopaedic Surgery

## 2017-11-23 DIAGNOSIS — Z96641 Presence of right artificial hip joint: Secondary | ICD-10-CM

## 2017-11-23 NOTE — Progress Notes (Signed)
The patient is now 9 months status post a right total hip arthroplasty.  She is someone with a BMI of 46.  She is walking without assistive device but still gets pain and stiffness in her hip and her leg.  She is been through physical therapy and water aerobics and walking.  The surgery was done December 28 of last year.  On exam she does walk with a slight limp.  Her leg lengths are equal.  Her incisions healed completely.  She tolerates me putting her hip the range of motion.  She does still report a burning pain around her right hip.  She does take Neurontin for this.  X-rays show well-seated implant with no complicating features.  Her left nonoperative hip appears normal.  At this point she will continue to try to increase her activities as comfort allows and will still work on weight loss.  She will continue Neurontin and occasional anti-inflammatories.  I will see her back in 6 months I would like an AP and lateral of the right hip only.  We do not need an AP pelvis.

## 2017-12-08 ENCOUNTER — Encounter: Payer: Self-pay | Admitting: Allergy

## 2017-12-08 ENCOUNTER — Ambulatory Visit (INDEPENDENT_AMBULATORY_CARE_PROVIDER_SITE_OTHER): Payer: PRIVATE HEALTH INSURANCE | Admitting: Allergy

## 2017-12-08 VITALS — BP 142/88 | HR 93 | Resp 16 | Ht 65.0 in | Wt 289.0 lb

## 2017-12-08 DIAGNOSIS — J310 Chronic rhinitis: Secondary | ICD-10-CM

## 2017-12-08 DIAGNOSIS — J4520 Mild intermittent asthma, uncomplicated: Secondary | ICD-10-CM

## 2017-12-08 DIAGNOSIS — J4521 Mild intermittent asthma with (acute) exacerbation: Secondary | ICD-10-CM

## 2017-12-08 MED ORDER — MONTELUKAST SODIUM 10 MG PO TABS: 10.0000 mg | ORAL_TABLET | Freq: Every day | ORAL | 5 refills | Status: DC

## 2017-12-08 MED ORDER — CETIRIZINE HCL 10 MG PO TABS: 10.0000 mg | ORAL_TABLET | Freq: Every day | ORAL | 5 refills | Status: DC

## 2017-12-08 MED ORDER — FLUTICASONE-SALMETEROL 250-50 MCG/DOSE IN AEPB: 1.0000 | INHALATION_SPRAY | Freq: Two times a day (BID) | RESPIRATORY_TRACT | 5 refills | Status: DC

## 2017-12-08 NOTE — Progress Notes (Signed)
Follow-up Note  RE: Emily Hester MRN: [id number] DOB: 01-22-1971 Date of Office Visit: 12/08/2017   History of present illness: Emily Hester is a 47 y.o. female presenting today for follow-up of allergic rhinitis and asthma. She was last seen September 20th 2018 by myself.  History obtained by Dr. Gwyneth Revels, internal medicine resident, and confirmed by myself the patient.  She reports that she has not been that good, she reports that the change of seasons have been very irritating and she is getting worsening shortness of breath and coughing for the past few months.   In regards to her asthma she reports that she has been having more shortness of breath and coughing, she reports some wheezing after using her albuterol inhaler. Denies chest tightness. She is using albuterol every 4 hours for the last week, but before last week she reports that she was using it once a day for the past 3 months. She was unable to get the Advair due to insurance not covering it and being too expensive. She was taking dulera from the samples that she had but she was only taking it once a day about twice a week instead of the twice a day on a daily basis. She also reports that she ran out of her Singulair 2 months ago, she felt like she was doing better so she didn't pick up her prescription. She reports that she is up all night coughing, she reports that she wakes up every night with shortness of breath and will need to use the albuterol. Denies any fevers, chills, nausea, vomiting, or new rashes. She had a hip replacement in December 2018, no other hospitalizations, no ER visits. Has not had any steroids since her last visit.   In regards to her allergies she reports that she is having itchy eyes, dry cough, nasal drip, and sneezing. Denies any congestion, no ear pressure. Reports some headaches in the frontal area and the temples. She has not been using the zyrtec but restarted last week which she reports  that it has been helping a little bit. She reports that she tried dymista but she doesn't remember if it helped or not, she is not using any nasal sprays now. She is using an over the counter eye drop which she states does not help.   Review of systems: Review of Systems  Constitutional: Negative for chills, fever and malaise/fatigue.  HENT: Positive for congestion. Negative for ear discharge, ear pain, nosebleeds, sinus pain and sore throat.   Eyes: Negative for pain, discharge and redness.  Respiratory: Positive for cough, shortness of breath and wheezing. Negative for sputum production.   Cardiovascular: Negative for chest pain.  Gastrointestinal: Negative for abdominal pain, constipation, diarrhea, heartburn, nausea and vomiting.  Musculoskeletal: Negative for joint pain.  Skin: Negative for itching and rash.  Neurological: Negative for headaches.    All other systems negative unless noted above in HPI  Past medical/social/surgical/family history have been reviewed and are unchanged unless specifically indicated below.  No changes  Medication List: Allergies as of 12/08/2017      Reactions   Amoxicillin Hives, Swelling, Other (See Comments)   Has patient had a PCN reaction causing immediate rash, facial/tongue/throat swelling, SOB or lightheadedness with hypotension: No Has patient had a PCN reaction causing severe rash involving mucus membranes or skin necrosis: No Has patient had a PCN reaction that required hospitalization: Yes Has patient had a PCN reaction occurring within the last 10 years: No  If all of the above answers are "NO", then may proceed with Cephalosporin use.   Sulfamethoxazole-trimethoprim Rash   Bactrim Rash      Medication List        Accurate as of 12/08/17  1:28 PM. Always use your most recent med list.          acetaminophen 500 MG tablet Commonly known as:  TYLENOL Take 1,000 mg by mouth every 6 (six) hours as needed for moderate pain or  headache.   albuterol 108 (90 Base) MCG/ACT inhaler Commonly known as:  PROVENTIL HFA;VENTOLIN HFA Inhale 2 puffs into the lungs every 4 (four) hours as needed for wheezing or shortness of breath.   amLODipine 10 MG tablet Commonly known as:  NORVASC Take 10 mg by mouth every morning.   cetirizine 10 MG tablet Commonly known as:  ZYRTEC Take 1 tablet (10 mg total) by mouth daily.   gabapentin 300 MG capsule Commonly known as:  NEURONTIN Take 1 capsule (300 mg total) by mouth 3 (three) times daily.   ibuprofen 200 MG tablet Commonly known as:  ADVIL,MOTRIN Take 400 mg by mouth every 6 (six) hours as needed for headache or moderate pain.   levonorgestrel 20 MCG/24HR IUD Commonly known as:  MIRENA 1 each by Intrauterine route once.   losartan 25 MG tablet Commonly known as:  COZAAR Take 25 mg by mouth daily.   metFORMIN 500 MG tablet Commonly known as:  GLUCOPHAGE Take 500 mg by mouth daily.   methocarbamol 500 MG tablet Commonly known as:  ROBAXIN TAKE 1 TABLET BY MOUTH EVERY 6 HOURS AS NEEDED FOR MUSCLE SPASMS   mometasone-formoterol 100-5 MCG/ACT Aero Commonly known as:  DULERA Inhale 2 puffs into the lungs 2 (two) times daily.   montelukast 10 MG tablet Commonly known as:  SINGULAIR Take 1 tablet (10 mg total) by mouth at bedtime.   omeprazole 20 MG capsule Commonly known as:  PRILOSEC Take 1 capsule (20 mg total) by mouth every morning.   ONETOUCH VERIO test strip Generic drug:  glucose blood AS DIRECTED CHECK SUGARS ONCE A DAY FASTING IN VITRO 90 DAYS   rosuvastatin 10 MG tablet Commonly known as:  CRESTOR Take 10 mg by mouth daily.   tiZANidine 4 MG capsule Commonly known as:  ZANAFLEX Take 1 capsule (4 mg total) by mouth 3 (three) times daily as needed for muscle spasms.   Vitamin D (Ergocalciferol) 50000 units Caps capsule Commonly known as:  DRISDOL Take 50,000 Units by mouth once a week.       Known medication allergies: Allergies    Allergen Reactions  . Amoxicillin Hives, Swelling and Other (See Comments)    Has patient had a PCN reaction causing immediate rash, facial/tongue/throat swelling, SOB or lightheadedness with hypotension: No Has patient had a PCN reaction causing severe rash involving mucus membranes or skin necrosis: No Has patient had a PCN reaction that required hospitalization: Yes Has patient had a PCN reaction occurring within the last 10 years: No  If all of the above answers are "NO", then may proceed with Cephalosporin use.   . Sulfamethoxazole-Trimethoprim Rash  . Bactrim Rash     Physical examination: Blood pressure (!) 142/88, pulse 93, resp. rate 16, height 5\' 5"  (1.651 m), weight 289 lb (131.1 kg), SpO2 96 %.  General: Alert, interactive, in no acute distress. HEENT: PERRLA, TMs pearly gray, turbinates moderately edematous with clear discharge, post-pharynx non erythematous. Neck: Supple without lymphadenopathy. Lungs: Mildly decreased breath sounds  bilaterally without wheezing, rhonchi or rales. {no increased work of breathing. CV: Normal S1, S2 without murmurs. Abdomen: Nondistended, nontender. Skin: Warm and dry, without lesions or rashes. Extremities:  No clubbing, cyanosis or edema. Neuro:   Grossly intact.  Diagnositics/Labs:  Spirometry: FEV1: 1.82L 75%, FVC: 2.25L 74% albuterol which she was approximately 2 hours before study  Assessment and plan:   1. Asthma, mild intermittent with exacerbation -Control is poor due to lack of maintenance medication - take prednisone course as directed.  Advised her to monitor her blood sugar while she is on prednisone - start Wixela 250/50 1 puff twice a day.  Ask pharmacy to open medication and demonstrate proper use.  She states she did check with her pharmacy who told her that "generic Advair" would only cost her $10 which she states is doable for her - Continue Albuterol rescue as needed - Resume Singulair (Montelukast) 10mg   nightly  Asthma control goals:   Full participation in all desired activities (may need albuterol before activity)  Albuterol use two time or less a week on average (not counting use with activity)  Cough interfering with sleep two time or less a month  Oral steroids no more than once a year  No hospitalizations   2. Allergic Rhinitis - Sample provided for Dymista 1 spray twice daily for nasal congestion and drainage.   This is a combination nasal spray with Flonase + Astelin (nasal antihistamine).   If not covered by insurance will prescribe Astelin/Azelastine separately.   - Continue Zyrtec (Cetirizine) 10 mg daily  - Resume Singulair (Montelukast) nightly - Start Pazeo or Pataday 1 drop each eye daily as needed for itchy/watery/red eyes  - Follow up within 3 months or sooner if needed  I appreciate the opportunity to take part in Emily Hester's care. Please do not hesitate to contact me with questions.  Sincerely,   Margo Aye, MD Allergy/Immunology [facility]

## 2017-12-08 NOTE — Patient Instructions (Addendum)
1. Asthma - poorly controlled at this time - take prednisone course as directed - start Wixela 250/50 1 puff twice a day.  Ask pharmacy to open medication and demonstrate proper use - Continue Albuterol rescue as needed - Resume Singulair (Montelukast) 10mg  nightly  Asthma control goals:   Full participation in all desired activities (may need albuterol before activity)  Albuterol use two time or less a week on average (not counting use with activity)  Cough interfering with sleep two time or less a month  Oral steroids no more than once a year  No hospitalizations   2. Allergic Rhinitis - Sample provided for Dymista 1 spray twice daily for nasal congestion and drainage.   This is a combination nasal spray with Flonase + Astelin (nasal antihistamine).   If not covered by insurance will prescribe Astelin/Azelastine separately.   - Continue Zyrtec (Cetirizine) 10 mg daily  - Resume Singulair (Montelukast) nightly - Start Pazeo or Pataday 1 drop each eye daily as needed for itchy/watery/red eyes  - Follow up within 3 months or sooner if needed

## 2017-12-13 ENCOUNTER — Encounter: Payer: Self-pay | Admitting: Physical Therapy

## 2017-12-13 ENCOUNTER — Ambulatory Visit: Payer: 59 | Attending: Family Medicine | Admitting: Physical Therapy

## 2017-12-13 DIAGNOSIS — Z96641 Presence of right artificial hip joint: Secondary | ICD-10-CM | POA: Diagnosis present

## 2017-12-13 DIAGNOSIS — R262 Difficulty in walking, not elsewhere classified: Secondary | ICD-10-CM | POA: Insufficient documentation

## 2017-12-13 DIAGNOSIS — M25651 Stiffness of right hip, not elsewhere classified: Secondary | ICD-10-CM | POA: Insufficient documentation

## 2017-12-13 DIAGNOSIS — M6281 Muscle weakness (generalized): Secondary | ICD-10-CM | POA: Diagnosis present

## 2017-12-13 NOTE — Therapy (Signed)
[place], [place], [address] Phone: [phone]   Fax:  [phone]  Physical Therapy Treatment/Re-evaluation   Patient Details  Name: Emily Hester MRN: [id number] Date of Birth: 28-Jan-1971 Referring Provider (PT): Dr. Jean Rosenthal   Encounter Date: 12/13/2017  PT End of Session - 12/13/17 0912    Visit Number  4    Number of Visits  10    Date for PT Re-Evaluation  01/17/18    Authorization Type  Pt's insurance is limiting pt to 10 visits and due to high co-pay pt will need to start out with 1x/week, also doing group 1 x per week     Authorization Time Period  has used 3 with [employer] this yr , was [facility]     PT Start Time  0845    PT Stop Time  0940    PT Time Calculation (min)  55 min    Activity Tolerance  Patient tolerated treatment well    Behavior During Therapy  [facility] for tasks assessed/performed       Past Medical History:  Diagnosis Date  . Arthritis   . Asthma   . Diabetes mellitus without complication ([place])    type   . Hypertension   . Interstitial cystitis   . IUD (intrauterine device) in place placed 6 weeks ago  . Migraines     Past Surgical History:  Procedure Laterality Date  . DILATION AND EVACUATION  09/30/2011   Procedure: DILATATION AND EVACUATION;  Surgeon: Allena Katz, MD;  Location: [facility];  Service: Gynecology;  Laterality: N/A;  . left knee surgery      arthroscopy   . TOTAL HIP ARTHROPLASTY Right 03/11/2017   Procedure: RIGHT TOTAL HIP ARTHROPLASTY ANTERIOR APPROACH;  Surgeon: Mcarthur Rossetti, MD;  Location: [facility];  Service: Orthopedics;  Laterality: Right;    There were no vitals filed for this visit.  Subjective Assessment - 12/13/17 0853    Subjective  Patient returns to PT , had a deth in the family and was out of town.  She has been to Group PT about 4 sessions, her insurance limits her to 10 visits per year. She cont to  be unable to walk normally, has stiffness, cannot lie on her Rt side.      Limitations  Walking;Standing;House hold activities   steps    How long can you walk comfortably?  starting a walking program tomorrow, 15-30 min not sure     Patient Stated Goals  She wants to get stronger and have less pain, mobility difficulty.       Currently in Pain?  Yes    Pain Score  1     Pain Location  Hip    Pain Orientation  Right    Pain Descriptors / Indicators  Aching;Sore    Pain Type  Chronic pain    Pain Onset  More than a month ago         [facility] PT Assessment - 12/13/17 0001      Assessment   Medical Diagnosis  Rt THA     Referring Provider (PT)  Dr. Jean Rosenthal    Onset Date/Surgical Date  03/11/17    Prior Therapy  Yes, continuing since 04/2017      Precautions   Precautions  None    Precaution Comments  anterior approach       Restrictions   Weight Bearing Restrictions  No  Balance Screen   Has the patient fallen in the past 6 months  No      Exline residence    Living Arrangements  Spouse/significant other    Type of [place] to enter    Entrance Stairs-Number of Steps  [place] or work area in basement    Additional Comments  20 steps to basement       Prior Function   Level of Independence  Independent    Vocation  Unemployed    Leisure  family       Cognition   Overall Cognitive Status  Within Functional Limits for tasks assessed      Observation/Other Assessments   Focus on Therapeutic Outcomes (FOTO)   did not test due to length of time between visits, not typical       Sensation   Additional Comments  min numbness Rt mid thigh lateral       Coordination   Gross Motor Movements are Fluid and Coordinated  Not tested      Functional Tests   Functional tests  Squat;Single leg stance      Squat   Comments  no pain, decent form        Single Leg Stance   Comments  <5 sec on Rt , 10 sec on L       Posture/Postural Control   Posture/Postural Control  Postural limitations    Postural Limitations  Increased lumbar lordosis;Anterior pelvic tilt    Posture Comments  obese abdomen , slight decrease in WB on Rt LE    genu recurvatum      Strength   Right Hip Flexion  4+/5   compensates by Externally rotating hip    Right Hip ABduction  4+/5    Left Hip Flexion  5/5    Left Hip Extension  5/5    Left Hip External Rotation  5/5    Left Hip Internal Rotation  5/5    Left Hip ABduction  5/5    Right Knee Flexion  5/5    Right Knee Extension  4+/5      Flexibility   Hamstrings  78 degrees R hamstring, L hamstring: 95 degrees      Palpation   Palpation comment  swelling , tenderness along Rt lateral hip and thigh           [facility] Adult PT Treatment/Exercise - 12/13/17 0001      Self-Care   Self-Care  Heat/Ice Application;Other Self-Care Comments    Heat/Ice Application  ice for swelling    Other Self-Care Comments   HEP, cues , discussed techniques for stair negotiation, goals       Knee/Hip Exercises: Stretches   Active Hamstring Stretch  Right;2 reps    ITB Stretch  Right;3 reps;30 seconds    ITB Stretch Limitations  also sidelying on L side to elongate Rt trunk, Leg       Knee/Hip Exercises: Supine   Bridges  Strengthening;Both;1 set      Knee/Hip Exercises: Sidelying   Hip ABduction  Strengthening;Right;1 set;20 reps    Clams  x 20       Knee/Hip Exercises: Prone   Hip Extension  Strengthening;Both;1 set;20 reps      Cryotherapy   Number Minutes Cryotherapy  10 Minutes    Cryotherapy Location  Hip    Type of Cryotherapy  Ice pack      Manual Therapy   Manual therapy comments  Rolling to Rt lateral hip, glute with [employer] and [employer]              PT Education - 12/13/17 [id number]    Education provided  Yes    Education Details  see flowsheet     Person(s) Educated  Patient     Methods  Explanation    Comprehension  Verbalized understanding       PT Short Term Goals - 12/13/17 0916      PT SHORT TERM GOAL #1   Title  She willl be independent with initial HEP    Status  Achieved      PT SHORT TERM GOAL #2   Title  Pt will be able to perform step over step using single hand rail on 6 inch steps.     Baseline  needs to work on this     Status  On-going        PT Long Term Goals - 12/13/17 0916      PT LONG TERM GOAL #1   Title  Pt will be able to improve her R LE strength to >/= 4+/5.     Time  5    Period  Weeks    Status  Achieved    Target Date  01/17/18      PT LONG TERM GOAL #2   Title  She will report no pain with general community walking without device    Baseline  3/10 -4/10     Time  5    Status  On-going    Target Date  01/17/18      PT LONG TERM GOAL #3   Title  She will be able to walk up and down 12 steps with one rail step over step pattern holding a light item.     Time  5    Period  Weeks    Status  New    Target Date  01/17/18      PT LONG TERM GOAL #4   Title  Pt will be able to stand on her Rt LE for 30 sec to demo improved stability     Time  5    Period  Weeks    Status  New    Target Date  01/17/18      PT LONG TERM GOAL #5   Title  pt will be able to get in and out of a car without pain.     Baseline  most of the time     Status  Partially Met    Target Date  01/17/18            Plan - 12/13/17 3149    Clinical Impression Statement  Patient returns for ERO, has been out of skilled PT since 09/06/17.  She continues to have difficulty with functional mobility related to proximal R hip pain.  Her main issue is the compensation she has to go up and down stairs.  SHe has a "pocket" of fluid near greater trochanter.  She massages her hip, does her HEP, water aerobics and plans to start a walking program tomorrow. She is motivated to improve her comfort with general mobility in and out of the home.  She no longer  uses the cane.      PT Treatment/Interventions  Cryotherapy;Therapeutic exercise;Therapeutic activities;Patient/family education;Functional mobility training;Manual  techniques;Taping;Passive range of motion;Gait training;Stair training;Dry needling;Balance training;Moist Heat;Electrical Stimulation    PT Next Visit Plan  closed chain strengthening, step ups  Try massage.IFC for swelling, ice.    PT Home Exercise Plan  hip abduction, hip extension, SLR, hamstring stretch, sit-stand, SLS, try to fins small step for step ups     Consulted and Agree with Plan of Care  Patient       Patient will benefit from skilled therapeutic intervention in order to improve the following deficits and impairments:  Pain, Decreased activity tolerance, Decreased endurance, Decreased strength, Difficulty walking  Visit Diagnosis: H/O total hip arthroplasty, right  Stiffness of right hip, not elsewhere classified  Muscle weakness (generalized)  Difficulty in walking, not elsewhere classified     Problem List Patient Active Problem List   Diagnosis Date Noted  . 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  . Unilateral primary osteoarthritis, right hip 01/27/2017    PAA,JENNIFER 12/13/2017, 9:59 AM  [place], [place], [address] Phone: [phone]   Fax:  [phone]  Name: Emily Hester MRN: [id number] Date of Birth: 22-Oct-1970  Raeford Razor, PT 12/13/17 9:59 AM Phone: [phone] Fax: [phone]

## 2017-12-21 ENCOUNTER — Encounter (INDEPENDENT_AMBULATORY_CARE_PROVIDER_SITE_OTHER): Payer: Self-pay | Admitting: Orthopaedic Surgery

## 2017-12-28 ENCOUNTER — Ambulatory Visit: Payer: 59 | Admitting: Physical Therapy

## 2018-01-02 ENCOUNTER — Encounter: Payer: 59 | Admitting: Physical Therapy

## 2018-01-02 ENCOUNTER — Ambulatory Visit: Payer: 59 | Admitting: Physical Therapy

## 2018-01-02 ENCOUNTER — Encounter: Payer: Self-pay | Admitting: Physical Therapy

## 2018-01-02 DIAGNOSIS — M25651 Stiffness of right hip, not elsewhere classified: Secondary | ICD-10-CM

## 2018-01-02 DIAGNOSIS — Z96641 Presence of right artificial hip joint: Secondary | ICD-10-CM

## 2018-01-02 DIAGNOSIS — M6281 Muscle weakness (generalized): Secondary | ICD-10-CM

## 2018-01-02 DIAGNOSIS — R262 Difficulty in walking, not elsewhere classified: Secondary | ICD-10-CM

## 2018-01-02 NOTE — Therapy (Signed)
[facility] Outpatient Rehabilitation [facility] [address] [place], [place], [address] Phone: [phone]   Fax:  [phone]  Physical Therapy Treatment  Patient Details  Name: Emily Hester MRN: [id number] Date of Birth: 04-06-1970 Referring Provider (PT): Dr. [name]   Encounter Date: 01/02/2018  PT End of Session - 01/02/18 1115    Visit Number  5    Number of Visits  10    Date for PT Re-Evaluation  01/17/18    PT Start Time  1104    PT Stop Time  1200    PT Time Calculation (min)  56 min    Activity Tolerance  Patient tolerated treatment well    Behavior During Therapy  [facility] for tasks assessed/performed       Past Medical History:  Diagnosis Date  . Arthritis   . Asthma   . Diabetes mellitus without complication (HCC)    type   . Hypertension   . Interstitial cystitis   . IUD (intrauterine device) in place placed 6 weeks ago  . Migraines     Past Surgical History:  Procedure Laterality Date  . DILATION AND EVACUATION  09/30/2011   Procedure: DILATATION AND EVACUATION;  Surgeon: Tarynn Andrea, MD;  Location: [facility];  Service: Gynecology;  Laterality: N/A;  . left knee surgery      arthroscopy   . TOTAL HIP ARTHROPLASTY Right 03/11/2017   Procedure: RIGHT TOTAL HIP ARTHROPLASTY ANTERIOR APPROACH;  Surgeon: Kathryne Hitch, MD;  Location: [facility];  Service: Orthopedics;  Laterality: Right;    There were no vitals filed for this visit.  Subjective Assessment - 01/02/18 1111    Subjective  Stairs are the most challenging thing.  I have been working on that.  Pain is usually 1/10-12/10, can be 4/10 at worst.  Still poket of swelling, MD said normal.      Currently in Pain?  Yes    Pain Score  2            [facility] Adult PT Treatment/Exercise - 01/02/18 0001      Self-Care   Other Self-Care Comments   HEP standing hip       Knee/Hip Exercises: Aerobic   Nustep  L6 ,6 min LE      Knee/Hip Exercises: Standing    Side Lunges Limitations  Reverse lunge with towel sliding     Hip Abduction  Stengthening;Both;1 set;15 reps    Lateral Step Up  Right;1 set;15 reps;Hand Hold: 1;Step Height: 6"    Forward Step Up  Right;1 set;15 reps;Hand Hold: 1;Step Height: 6"    Step Down  Right;1 set;10 reps;Hand Hold: 2;Step Height: 6"    Other Standing Knee Exercises  semi circle single leg towel slides       Knee/Hip Exercises: Seated   Sit to Sand  3 sets;10 reps;with UE support      Knee/Hip Exercises: Sidelying   Hip ABduction  Strengthening;Right;1 set;20 reps    Clams  x 20    blue band      Cryotherapy   Number Minutes Cryotherapy  10 Minutes    Cryotherapy Location  Hip    Type of Cryotherapy  Ice pack      Manual Therapy   Manual Therapy  Soft tissue mobilization    Manual therapy comments  Rt lateral and ant thigh IASTM                PT Short Term Goals -  01/02/18 1235      PT SHORT TERM GOAL #1   Title  She willl be independent with initial HEP    Status  Achieved      PT SHORT TERM GOAL #2   Title  Pt will be able to perform step over step using single hand rail on 6 inch steps.     Baseline  can do with mild difficulty, step down are a challenge    Status  Achieved        PT Long Term Goals - 01/02/18 1235      PT LONG TERM GOAL #1   Title  Pt will be able to improve her R LE strength to >/= 4+/5.     Status  Achieved      PT LONG TERM GOAL #2   Title  She will report no pain with general community walking without device    Baseline  2/10-3/10    Status  On-going      PT LONG TERM GOAL #3   Title  She will be able to walk up and down 12 steps with one rail step over step pattern holding a light item.     Status  On-going      PT LONG TERM GOAL #4   Title  Pt will be able to stand on her Rt LE for 30 sec to demo improved stability     Status  On-going      PT LONG TERM GOAL #5   Title  pt will be able to get in and out of a car without pain.     Baseline   improving     Status  On-going            Plan - 01/02/18 1112    Clinical Impression Statement  Patient cont to have difficulty laying on Rt side and performing step ups, downs with normal movement patterns.  She is overall doing well but frustrated about lack of progress.  MD ensured her it was normal to feel pain up to a year.     PT Treatment/Interventions  Cryotherapy;Therapeutic exercise;Therapeutic activities;Patient/family education;Functional mobility training;Manual techniques;Taping;Passive range of motion;Gait training;Stair training;Dry needling;Balance training;Moist Heat;Electrical Stimulation    PT Next Visit Plan  closed chain strengthening, step ups  Try massage.IFC for swelling, ice.    PT Home Exercise Plan  hip abduction, hip extension, SLR, hamstring stretch, sit-stand, SLS, try to find small step for step ups     Consulted and Agree with Plan of Care  Patient       Patient will benefit from skilled therapeutic intervention in order to improve the following deficits and impairments:  Pain, Decreased activity tolerance, Decreased endurance, Decreased strength, Difficulty walking  Visit Diagnosis: H/O total hip arthroplasty, right  Stiffness of right hip, not elsewhere classified  Muscle weakness (generalized)  Difficulty in walking, not elsewhere classified     Problem List Patient Active Problem List   Diagnosis Date Noted  . 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  . Unilateral primary osteoarthritis, right hip 01/27/2017    Emily Hester 01/02/2018, 12:39 PM  [facility] [address] [place], [place], [address] Phone: [phone]   Fax:  [phone]  Name: Emily Hester MRN: [id number] Date of Birth: 09/22/1970  Karie Mainland, PT 01/02/18 12:41 PM Phone: [phone] Fax: [phone]

## 2018-01-04 ENCOUNTER — Emergency Department (HOSPITAL_COMMUNITY)
Admission: EM | Admit: 2018-01-04 | Discharge: 2018-01-04 | Disposition: A | Discharge status: Home / Self Care | Payer: 59 | Attending: Emergency Medicine | Admitting: Emergency Medicine

## 2018-01-04 ENCOUNTER — Other Ambulatory Visit: Payer: Self-pay

## 2018-01-04 ENCOUNTER — Encounter (HOSPITAL_COMMUNITY): Payer: Self-pay

## 2018-01-04 DIAGNOSIS — Z7984 Long term (current) use of oral hypoglycemic drugs: Secondary | ICD-10-CM | POA: Diagnosis not present

## 2018-01-04 DIAGNOSIS — E1165 Type 2 diabetes mellitus with hyperglycemia: Secondary | ICD-10-CM | POA: Diagnosis not present

## 2018-01-04 DIAGNOSIS — Z79899 Other long term (current) drug therapy: Secondary | ICD-10-CM | POA: Insufficient documentation

## 2018-01-04 DIAGNOSIS — I10 Essential (primary) hypertension: Secondary | ICD-10-CM | POA: Diagnosis not present

## 2018-01-04 DIAGNOSIS — R739 Hyperglycemia, unspecified: Secondary | ICD-10-CM

## 2018-01-04 LAB — CBC
HEMATOCRIT: 39.6 % (ref 36.0–46.0)
HEMOGLOBIN: 12.3 g/dL (ref 12.0–15.0)
MCH: 25.7 pg — AB (ref 26.0–34.0)
MCHC: 31.1 g/dL (ref 30.0–36.0)
MCV: 82.8 fL (ref 80.0–100.0)
Platelets: 271 10*3/uL (ref 150–400)
RBC: 4.78 MIL/uL (ref 3.87–5.11)
RDW: 15.3 % (ref 11.5–15.5)
WBC: 10 10*3/uL (ref 4.0–10.5)
nRBC: 0 % (ref 0.0–0.2)

## 2018-01-04 LAB — BASIC METABOLIC PANEL
ANION GAP: 9 (ref 5–15)
BUN: 11 mg/dL (ref 6–20)
CHLORIDE: 101 mmol/L (ref 98–111)
CO2: 22 mmol/L (ref 22–32)
Calcium: 8.9 mg/dL (ref 8.9–10.3)
Creatinine, Ser: 0.86 mg/dL (ref 0.44–1.00)
GFR calc Af Amer: >60 mL/min (ref >60)
Glucose, Bld: 395 mg/dL — ABNORMAL HIGH (ref 70–99)
POTASSIUM: 3.7 mmol/L (ref 3.5–5.1)
Sodium: 132 mmol/L — ABNORMAL LOW (ref 135–145)

## 2018-01-04 LAB — URINALYSIS, ROUTINE W REFLEX MICROSCOPIC
Bacteria, UA: NONE SEEN
Bilirubin Urine: NEGATIVE
GLUCOSE, UA: 150 mg/dL — AB
Ketones, ur: 5 mg/dL — AB
LEUKOCYTES UA: NEGATIVE
NITRITE: NEGATIVE
PROTEIN: NEGATIVE mg/dL
SPECIFIC GRAVITY, URINE: 1.01 (ref 1.005–1.030)
pH: 6 (ref 5.0–8.0)

## 2018-01-04 LAB — CBG MONITORING, ED
GLUCOSE-CAPILLARY: 393 mg/dL — AB (ref 70–99)
Glucose-Capillary: 258 mg/dL — ABNORMAL HIGH (ref 70–99)
Glucose-Capillary: 261 mg/dL — ABNORMAL HIGH (ref 70–99)

## 2018-01-04 LAB — I-STAT BETA HCG BLOOD, ED (MC, WL, AP ONLY): I-stat hCG, quantitative: <5 m[IU]/mL (ref <5)

## 2018-01-04 MED ORDER — INSULIN ASPART 100 UNIT/ML ~~LOC~~ SOLN
5.0000 [IU] | Freq: Once | SUBCUTANEOUS | Status: AC
  Administered 2018-01-04: 5 [IU] via INTRAVENOUS
  Filled 2018-01-04: qty 1

## 2018-01-04 MED ORDER — LACTATED RINGERS IV BOLUS
1000.0000 mL | Freq: Once | INTRAVENOUS | Status: AC
  Administered 2018-01-04: 1000 mL via INTRAVENOUS

## 2018-01-04 NOTE — ED Triage Notes (Signed)
Pt states that she was on Prednisone about 2-3 weeks ago for her asthma and since her blood sugars have been running high reporting around 300-400.

## 2018-01-04 NOTE — ED Notes (Signed)
Family at bedside. 

## 2018-01-04 NOTE — ED Provider Notes (Signed)
[facility] EMERGENCY DEPARTMENT Provider Note   CSN: [id number] Arrival date & time: 01/04/18  1333     History   Chief Complaint Chief Complaint  Patient presents with  . Hyperglycemia    HPI Emily Hester is a 47 y.o. female.   Hyperglycemia  Blood sugar level PTA:  >450 Severity:  Moderate Onset quality:  Gradual Duration:  2 weeks Timing:  Constant Progression:  Worsening Chronicity:  New Diabetes status:  Controlled with oral medications Current diabetic therapy:  Metformin Context: not change in medication, not noncompliance and not recent illness   Context comment:  Recently on prednisone Relieved by:  None tried Ineffective treatments:  Oral agents and diet Associated symptoms: increased thirst, nausea, polyuria and weakness     Past Medical History:  Diagnosis Date  . Arthritis   . Asthma   . Diabetes mellitus without complication (HCC)    type   . Hypertension   . Interstitial cystitis   . IUD (intrauterine device) in place placed 6 weeks ago  . Migraines     Patient Active Problem List   Diagnosis Date Noted  . 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  . Unilateral primary osteoarthritis, right hip 01/27/2017    Past Surgical History:  Procedure Laterality Date  . DILATION AND EVACUATION  09/30/2011   Procedure: DILATATION AND EVACUATION;  Surgeon: Elinor Andrea, MD;  Location: [facility];  Service: Gynecology;  Laterality: N/A;  . left knee surgery      arthroscopy   . TOTAL HIP ARTHROPLASTY Right 03/11/2017   Procedure: RIGHT TOTAL HIP ARTHROPLASTY ANTERIOR APPROACH;  Surgeon: Kathryne Hitch, MD;  Location: [facility];  Service: Orthopedics;  Laterality: Right;     OB History    Gravida  1   Para      Term      Preterm      AB      Living        SAB      TAB      Ectopic      Multiple      Live Births               Home Medications    Prior to Admission  medications   Medication Sig Start Date End Date Taking? Authorizing Provider  acetaminophen (TYLENOL) 500 MG tablet Take 1,000 mg by mouth every 6 (six) hours as needed for moderate pain or headache.   Yes [provider]  albuterol (PROVENTIL HFA;VENTOLIN HFA) 108 (90 Base) MCG/ACT inhaler Inhale 2 puffs into the lungs every 4 (four) hours as needed for wheezing or shortness of breath. 12/02/16  Yes Padgett, Pilar Grammes, MD  amLODipine (NORVASC) 10 MG tablet Take 10 mg by mouth every morning.   Yes [provider]  cetirizine (ZYRTEC) 10 MG tablet Take 1 tablet (10 mg total) by mouth daily. 12/08/17  Yes Padgett, Pilar Grammes, MD  Fluticasone-Salmeterol [facility] INHUB) 250-50 MCG/DOSE AEPB Inhale 1 puff into the lungs 2 (two) times daily. 12/08/17  Yes Padgett, Pilar Grammes, MD  gabapentin (NEURONTIN) 300 MG capsule Take 1 capsule (300 mg total) by mouth 3 (three) times daily. 09/12/17  Yes Kathryne Hitch, MD  ibuprofen (ADVIL,MOTRIN) 200 MG tablet Take 400 mg by mouth every 6 (six) hours as needed for headache or moderate pain.   Yes [provider]  levonorgestrel (MIRENA) 20 MCG/24HR IUD 1 each by Intrauterine route once.  Yes [provider]  losartan (COZAAR) 25 MG tablet Take 25 mg by mouth daily. 10/20/16  Yes [provider]  metFORMIN (GLUCOPHAGE) 500 MG tablet Take 500 mg by mouth 2 (two) times daily with a meal.    Yes [provider]  montelukast (SINGULAIR) 10 MG tablet Take 1 tablet (10 mg total) by mouth at bedtime. 12/08/17  Yes Padgett, Pilar Grammes, MD  omeprazole (PRILOSEC) 20 MG capsule Take 1 capsule (20 mg total) by mouth every morning. Patient taking differently: Take 20 mg by mouth 2 (two) times daily.  12/02/16  Yes Padgett, Pilar Grammes, MD  rosuvastatin (CRESTOR) 10 MG tablet Take 10 mg by mouth daily. 10/20/16  Yes [provider]  tiZANidine (ZANAFLEX) 4 MG capsule Take 1 capsule (4 mg  total) by mouth 3 (three) times daily as needed for muscle spasms. 08/15/17  Yes Kirtland Bouchard, PA-C  Vitamin D, Ergocalciferol, (DRISDOL) 50000 units CAPS capsule Take 50,000 Units by mouth every Monday.  11/25/17  Yes [provider]  [facility] VERIO test strip as directed.  09/01/17   [provider]    Family History Family History  Problem Relation Age of Onset  . Diabetes Mother   . Heart disease Mother   . Diabetes Father   . Eczema Sister   . Asthma Brother   . Asthma Son   . Eczema Daughter   . Allergic rhinitis Neg Hx   . Angioedema Neg Hx   . Immunodeficiency Neg Hx   . Urticaria Neg Hx     Social History Social History   Tobacco Use  . Smoking status: Never Smoker  . Smokeless tobacco: Never Used  Substance Use Topics  . Alcohol use: No  . Drug use: No     Allergies   Amoxicillin; Sulfamethoxazole-trimethoprim; Lisinopril; and Bactrim   Review of Systems Review of Systems  Gastrointestinal: Positive for nausea.  Endocrine: Positive for polydipsia and polyuria.  Neurological: Positive for weakness and headaches.  All other systems reviewed and are negative.    Physical Exam Updated Vital Signs BP 133/86   Pulse 99   Temp 98.2 F (36.8 C) (Oral)   Resp 18   Ht 5\' 5"  (1.651 m)   Wt 122.5 kg   LMP 12/25/2017   SpO2 98%   BMI 44.93 kg/m   Physical Exam  Constitutional: She is oriented to person, place, and time. She appears well-developed and well-nourished.  HENT:  Head: Normocephalic and atraumatic.  Eyes: Conjunctivae and EOM are normal.  Neck: Normal range of motion.  Cardiovascular: Normal rate and regular rhythm.  Pulmonary/Chest: Effort normal and breath sounds normal. No stridor. No respiratory distress.  Abdominal: Soft. Bowel sounds are normal. She exhibits no distension.  Musculoskeletal: Normal range of motion. She exhibits no edema or deformity.  Neurological: She is alert and oriented to person, place, and  time. No cranial nerve deficit.  Skin: Skin is warm and dry.  Nursing note and vitals reviewed.    ED Treatments / Results  Labs (all labs ordered are listed, but only abnormal results are displayed) Labs Reviewed  BASIC METABOLIC PANEL - Abnormal; Notable for the following components:      Result Value   Sodium 132 (*)    Glucose, Bld 395 (*)    All other components within normal limits  CBC - Abnormal; Notable for the following components:   MCH 25.7 (*)    All other components within normal limits  URINALYSIS, ROUTINE  W REFLEX MICROSCOPIC - Abnormal; Notable for the following components:   Glucose, UA 150 (*)    Hgb urine dipstick MODERATE (*)    Ketones, ur 5 (*)    All other components within normal limits  CBG MONITORING, ED - Abnormal; Notable for the following components:   Glucose-Capillary 393 (*)    All other components within normal limits  CBG MONITORING, ED - Abnormal; Notable for the following components:   Glucose-Capillary 258 (*)    All other components within normal limits  CBG MONITORING, ED - Abnormal; Notable for the following components:   Glucose-Capillary 261 (*)    All other components within normal limits  I-STAT BETA HCG BLOOD, ED ([facility], [facility], [facility] ONLY)    EKG EKG Interpretation  Date/Time:  Wednesday January 04 2018 13:45:33 EDT Ventricular Rate:  120 PR Interval:  152 QRS Duration: 78 QT Interval:  308 QTC Calculation: 435 R Axis:   15 Text Interpretation:  Sinus tachycardia Low voltage QRS Septal infarct , age undetermined Abnormal ECG Rate faster otherwise no significant changes Confirmed by Marily Memos [phone]) on 01/04/2018 5:04:43 PM   Radiology No results found.  Procedures Procedures (including critical care time)  Medications Ordered in ED Medications  lactated ringers bolus 1,000 mL (0 mLs Intravenous Stopped 01/04/18 2021)  insulin aspart (novoLOG) injection 5 Units (5 Units Intravenous Given 01/04/18 1808)  lactated  ringers bolus 1,000 mL (0 mLs Intravenous Stopped 01/04/18 2021)  insulin aspart (novoLOG) injection 5 Units (5 Units Intravenous Given 01/04/18 1924)     Initial Impression / Assessment and Plan / ED Course  I have reviewed the triage vital signs and the nursing notes.  Pertinent labs & imaging results that were available during my care of the patient were reviewed by me and considered in my medical decision making (see chart for details).     Improving blood sugar without DKA. Will dc to fu w/ pcp.   Final Clinical Impressions(s) / ED Diagnoses   Final diagnoses:  Hyperglycemia    ED Discharge Orders    None       Bertrice Leder, Barbara Cower, MD 01/04/18 [phone]

## 2018-01-04 NOTE — ED Notes (Signed)
Pt reports she taking Prednisone for asthma flare up causing her blood sugar to increase.  She finished taking Prednisone 2 weeks ago and her glucose have been more than 300-400.  Reports polyuria and polydipsia

## 2018-01-04 NOTE — ED Notes (Signed)
E-signature not available, pt verbalized understanding of DC instructions  

## 2018-01-10 ENCOUNTER — Ambulatory Visit: Payer: 59 | Admitting: Physical Therapy

## 2018-01-17 ENCOUNTER — Ambulatory Visit: Payer: 59 | Attending: Family Medicine | Admitting: Physical Therapy

## 2018-01-17 ENCOUNTER — Encounter: Payer: Self-pay | Admitting: Physical Therapy

## 2018-01-17 DIAGNOSIS — M6281 Muscle weakness (generalized): Secondary | ICD-10-CM | POA: Diagnosis present

## 2018-01-17 DIAGNOSIS — R262 Difficulty in walking, not elsewhere classified: Secondary | ICD-10-CM | POA: Diagnosis present

## 2018-01-17 DIAGNOSIS — Z96641 Presence of right artificial hip joint: Secondary | ICD-10-CM | POA: Diagnosis not present

## 2018-01-17 DIAGNOSIS — M25651 Stiffness of right hip, not elsewhere classified: Secondary | ICD-10-CM | POA: Insufficient documentation

## 2018-01-17 NOTE — Therapy (Addendum)
[place], [place], [address] Phone: [phone]   Fax:  [phone]  Physical Therapy Treatment/Discharge  Patient Details  Name: Emily Hester MRN: [id number] Date of Birth: [birth year] Referring Provider (PT): Dr. Jean Rosenthal   Encounter Date: 01/17/2018  PT End of Session - 01/17/18 0953    Visit Number  6    Number of Visits  10    Date for PT Re-Evaluation  01/17/18    Authorization Type  Pt's insurance is limiting pt to 10 visits and due to high co-pay pt will need to start out with 1x/week, also doing group 1 x per week     Authorization Time Period  has used 3 with [employer] this yr , was [facility]     PT Start Time  [phone]    PT Stop Time  1025    PT Time Calculation (min)  52 min    Activity Tolerance  Patient tolerated treatment well    Behavior During Therapy  [facility] for tasks assessed/performed       Past Medical History:  Diagnosis Date  . Arthritis   . Asthma   . Diabetes mellitus without complication ([place])    type   . Hypertension   . Interstitial cystitis   . IUD (intrauterine device) in place placed 6 weeks ago  . Migraines     Past Surgical History:  Procedure Laterality Date  . DILATION AND EVACUATION  09/30/2011   Procedure: DILATATION AND EVACUATION;  Surgeon: Allena Katz, MD;  Location: [facility];  Service: Gynecology;  Laterality: N/A;  . left knee surgery      arthroscopy   . TOTAL HIP ARTHROPLASTY Right 03/11/2017   Procedure: RIGHT TOTAL HIP ARTHROPLASTY ANTERIOR APPROACH;  Surgeon: Mcarthur Rossetti, MD;  Location: [facility];  Service: Orthopedics;  Laterality: Right;    There were no vitals filed for this visit.  Subjective Assessment - 01/17/18 0937    Subjective  Was at the ED for hyperglycemia.  On prednisone for asthma, that shot it up.  Affecting my vision, everything is blurry.  I could not lift my leg at all last week.      Currently  in Pain?  Yes    Pain Score  2     Pain Location  Hip    Pain Orientation  Right    Pain Descriptors / Indicators  Aching    Pain Type  Chronic pain;Surgical pain    Pain Onset  More than a month ago    Pain Frequency  Intermittent    Aggravating Factors   stairs, walking , hip flexion (car, transfers)     Pain Relieving Factors  change positions, ice, rest                        [facility] Adult PT Treatment/Exercise - 01/17/18 0001      Ambulation/Gait   Stairs Assistance  6: Modified independent (Device/Increase time)    Stair Management Technique  One rail Right;Alternating pattern    Number of Stairs  12    Height of Stairs  6   carrying bin of toys    Gait Comments  compensation with higher step descending       Knee/Hip Exercises: Stretches   Active Hamstring Stretch  Right;2 reps    ITB Stretch  Right;2 reps    Other Knee/Hip Stretches  adductor stretch x 5 each  side       Knee/Hip Exercises: Aerobic   Recumbent Bike  6 min L1       Knee/Hip Exercises: Standing   Hip Abduction  Stengthening;Both;1 set;15 reps    Hip Extension  Stengthening;Both;1 set    Functional Squat  1 set;15 reps      Knee/Hip Exercises: Supine   Straight Leg Raises  Strengthening;Right;1 set;10 reps    Straight Leg Raises Limitations  PT assist     Knee Flexion  Strengthening;Right;1 set;10 reps    Knee Flexion Limitations  supine as seated increased pain (hip flexion)       Knee/Hip Exercises: Sidelying   Hip ABduction  Strengthening;Right;2 sets;10 reps    Clams  x 20       Cryotherapy   Number Minutes Cryotherapy  8 Minutes    Cryotherapy Location  Hip    Type of Cryotherapy  Ice pack             PT Education - 01/17/18 0957    Education provided  Yes    Education Details  transition to gym ?     Person(s) Educated  Patient    Methods  Explanation    Comprehension  Verbalized understanding       PT Short Term Goals - 01/17/18 1001      PT SHORT TERM GOAL  #1   Title  She willl be independent with initial HEP    Status  Achieved      PT SHORT TERM GOAL #2   Title  Pt will be able to perform step over step using single hand rail on 6 inch steps.     Status  Achieved        PT Long Term Goals - 01/17/18 1001      PT LONG TERM GOAL #1   Title  Pt will be able to improve her R LE strength to >/= 4+/5.     Baseline  today, 3/5 due to recent flare up, episode     Status  On-going      PT LONG TERM GOAL #2   Title  She will report no pain with general community walking without device    Baseline  always mild     Status  Partially Met      PT LONG TERM GOAL #3   Title  She will be able to walk up and down 12 steps with one rail step over step pattern holding a light item.     Baseline  trouble descending     Status  On-going      PT LONG TERM GOAL #4   Title  Pt will be able to stand on her Rt LE for 30 sec to demo improved stability     Status  On-going      PT LONG TERM GOAL #5   Title  pt will be able to get in and out of a car without pain.     Baseline  set back this weekend , was met prior     Status  Partially Met            Plan - 01/17/18 0959    Clinical Impression Statement  Patient having multiple issues with her health.  She is not sure why her Rt groin was so painful and weak last week, coincided with her diabetes/asthma issues.   Pain is back to baseline but she cont with 3-/5 to 3/4 hip flexor strength.  She cannot stand on her Rt leg for >20 sec.  She has pain with descending steps.  She would like to finish her gym program visits (has 2) and then may consider cont PT vs going to the gym.  There is not much else PT can do for her.  Financially it seems that a guided gym program would be what she needs to work on strengthening and fitness to impact her overall health. Keeping chart/episode open until we see her at Group again.     PT Treatment/Interventions  Cryotherapy;Therapeutic exercise;Therapeutic  activities;Patient/family education;Functional mobility training;Manual techniques;Taping;Passive range of motion;Gait training;Stair training;Dry needling;Balance training;Moist Heat;Electrical Stimulation    PT Next Visit Plan  renew vs gym.      PT Home Exercise Plan  hip abduction, hip extension, SLR, hamstring stretch, sit-stand, SLS, try to find small step for step ups     Consulted and Agree with Plan of Care  Patient       Patient will benefit from skilled therapeutic intervention in order to improve the following deficits and impairments:  Pain, Decreased activity tolerance, Decreased endurance, Decreased strength, Difficulty walking  Visit Diagnosis: H/O total hip arthroplasty, right  Stiffness of right hip, not elsewhere classified  Muscle weakness (generalized)  Difficulty in walking, not elsewhere classified     Problem List Patient Active Problem List   Diagnosis Date Noted  . 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  . Unilateral primary osteoarthritis, right hip 01/27/2017    Nassim Cosma 01/17/2018, 1:08 PM  [place], [place], [address] Phone: [phone]   Fax:  [phone]  Name: JOSEPHENE MARRONE MRN: [id number] Date of Birth: 09/22/1970   Raeford Razor, PT 01/17/18 1:08 PM Phone: [phone] Fax: [phone]  PHYSICAL THERAPY DISCHARGE SUMMARY  Visits from Start of Care: 6  Current functional level related to goals / functional outcomes: See above    Remaining deficits: Pain, swelling, weakness   Education / Equipment: HEP, RICE, gait , gym exercises and gym program   Plan: Patient agrees to discharge.  Patient goals were not met. Patient is being discharged due to financial reasons.  ?????    [place], has not returned.   Raeford Razor, PT 02/20/18 2:09 PM Phone: [phone] Fax: [phone]

## 2018-01-30 ENCOUNTER — Encounter (INDEPENDENT_AMBULATORY_CARE_PROVIDER_SITE_OTHER): Payer: Self-pay | Admitting: Orthopaedic Surgery

## 2018-03-03 ENCOUNTER — Ambulatory Visit: Payer: PRIVATE HEALTH INSURANCE | Admitting: Allergy

## 2018-03-21 ENCOUNTER — Telehealth (INDEPENDENT_AMBULATORY_CARE_PROVIDER_SITE_OTHER): Payer: Self-pay | Admitting: Orthopaedic Surgery

## 2018-03-21 NOTE — Telephone Encounter (Signed)
Emily Hester from the [facility] left a message yesterday wanting to confirm a fax that was sent to Dr. Magnus Ivan.  CB#[phone] ext. [phone].  Thank you.

## 2018-03-22 ENCOUNTER — Ambulatory Visit: Payer: PRIVATE HEALTH INSURANCE | Admitting: Allergy

## 2018-04-05 ENCOUNTER — Ambulatory Visit: Payer: PRIVATE HEALTH INSURANCE | Admitting: Allergy

## 2018-05-10 ENCOUNTER — Telehealth (INDEPENDENT_AMBULATORY_CARE_PROVIDER_SITE_OTHER): Payer: Self-pay | Admitting: Orthopaedic Surgery

## 2018-05-10 NOTE — Telephone Encounter (Signed)
Please advise 

## 2018-05-10 NOTE — Telephone Encounter (Signed)
Pt called back stating she is still in extreme pain and due to the fact she hasn't received a call back yet she is just going to go to the urgent care.

## 2018-05-10 NOTE — Telephone Encounter (Signed)
She needs to come in and be seen please make her an appointment.

## 2018-05-10 NOTE — Telephone Encounter (Signed)
Patient called advised she is having a lot of burning and stinging in her right hip. Patient said she has not gotten better and she is having trouble walking. Patient said she still can not lay down on right side. The number to contact patient is [phone]

## 2018-05-11 NOTE — Telephone Encounter (Signed)
I offered her an appointment for Monday and she refused stating she was going to UC today. She stated that she has been having this burning sensation in her leg on and off since surgery

## 2018-05-15 ENCOUNTER — Encounter (INDEPENDENT_AMBULATORY_CARE_PROVIDER_SITE_OTHER): Payer: Self-pay

## 2018-05-15 ENCOUNTER — Other Ambulatory Visit (INDEPENDENT_AMBULATORY_CARE_PROVIDER_SITE_OTHER): Payer: Self-pay | Admitting: Physician Assistant

## 2018-05-15 ENCOUNTER — Encounter (INDEPENDENT_AMBULATORY_CARE_PROVIDER_SITE_OTHER): Payer: Self-pay | Admitting: Physician Assistant

## 2018-05-15 ENCOUNTER — Ambulatory Visit (INDEPENDENT_AMBULATORY_CARE_PROVIDER_SITE_OTHER): Payer: PRIVATE HEALTH INSURANCE

## 2018-05-15 ENCOUNTER — Ambulatory Visit (INDEPENDENT_AMBULATORY_CARE_PROVIDER_SITE_OTHER): Payer: PRIVATE HEALTH INSURANCE | Admitting: Physician Assistant

## 2018-05-15 DIAGNOSIS — Z96641 Presence of right artificial hip joint: Secondary | ICD-10-CM

## 2018-05-15 DIAGNOSIS — M25551 Pain in right hip: Secondary | ICD-10-CM

## 2018-05-15 MED ORDER — TRAMADOL HCL 50 MG PO TABS: 50.0000 mg | ORAL_TABLET | Freq: Four times a day (QID) | ORAL | 0 refills | Status: DC | PRN

## 2018-05-15 MED ORDER — GABAPENTIN 300 MG PO CAPS: 300.0000 mg | ORAL_CAPSULE | Freq: Three times a day (TID) | ORAL | 3 refills | Status: DC

## 2018-05-15 NOTE — Progress Notes (Signed)
Office Visit Note   [name]           Date of Birth: September 21, 1970           MRN: [id number] Visit Date: 05/15/2018              Requested by: Merri Brunette, MD [id number] [place], [place] [address] PCP: Merri Brunette, MD   Assessment & Plan: Visit Diagnoses:  1. Status post total replacement of right hip   2. Pain of right hip joint     Plan: We will obtain a bone scan to rule out loosening of the right total hip.  She is given IT band stretching exercises to perform.  Follow-up with Dr. Ranae Plumber after the bone scan discuss further treatment.  Questions encouraged and answered at length.  Follow-Up Instructions: Return for Dr. Magnus Ivan after Bone scan.   Orders:  Orders Placed This Encounter  Procedures  . XR HIP UNILAT W OR W/O PELVIS 2-3 VIEWS RIGHT   No orders of the defined types were placed in this encounter.     Procedures: No procedures performed   Clinical Data: No additional findings.   Subjective: Chief Complaint  Patient presents with  . Right Hip - Pain    HPI Emily Hester is now 14 months status post right total hip arthroplasty.  She continues to have pain in the right hip.  She describes the pain is burning and stinging goes down her entire right leg and sometimes into the foot.  She notes swelling lateral aspect of her right hip.  She is had no fevers chills.  States pain score is burning and stinging is constant.  She states that she sits too long she is stiff and has trouble getting up and beginning to ambulate.  She is unable to sleep on the right side.  She reports no back pain.  History of MRI lumbar spine 2018 which showed some mild arthritic changes but no foraminal or spinal stenosis. Review of Systems See HPI otherwise negative  Objective: Vital Signs: There were no vitals taken for this visit.  Physical Exam Constitutional:      Appearance: She is not ill-appearing or diaphoretic.  Pulmonary:   Effort: Pulmonary effort is normal.  Neurological:     Mental Status: She is alert and oriented to person, place, and time.  Psychiatric:        Mood and Affect: Mood normal.        Behavior: Behavior normal.     Ortho Exam Right hip good range of motion with internal and external rotation without pain.  Tenderness over the right trochanteric region.  5 out of 5 strength throughout the lower extremities against resistance negative straight leg raise bilaterally.  Sensation grossly intact bilateral feet to light touch. Specialty Comments:  No specialty comments available.  Imaging: Xr Hip Unilat W Or W/o Pelvis 2-3 Views Right  Result Date: 05/15/2018 AP right hip and lateral view: No acute fractures.  Right hip is well located.  Components appear all well-seated.    PMFS History: Patient Active Problem List   Diagnosis Date Noted  . 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  . Unilateral primary osteoarthritis, right hip 01/27/2017   Past Medical History:  Diagnosis Date  . Arthritis   . Asthma   . Diabetes mellitus without complication (HCC)    type   . Hypertension   . Interstitial cystitis   . IUD (intrauterine  device) in place placed 6 weeks ago  . Migraines     Family History  Problem Relation Age of Onset  . Diabetes Mother   . Heart disease Mother   . Diabetes Father   . Eczema Sister   . Asthma Brother   . Asthma Son   . Eczema Daughter   . Allergic rhinitis Neg Hx   . Angioedema Neg Hx   . Immunodeficiency Neg Hx   . Urticaria Neg Hx     Past Surgical History:  Procedure Laterality Date  . DILATION AND EVACUATION  09/30/2011   Procedure: DILATATION AND EVACUATION;  Surgeon: Kylynn Andrea, MD;  Location: [facility];  Service: Gynecology;  Laterality: N/A;  . left knee surgery      arthroscopy   . TOTAL HIP ARTHROPLASTY Right 03/11/2017   Procedure: RIGHT TOTAL HIP ARTHROPLASTY ANTERIOR APPROACH;  Surgeon: Kathryne Hitch, MD;   Location: [facility];  Service: Orthopedics;  Laterality: Right;   Social History   Occupational History  . Not on file  Tobacco Use  . Smoking status: Never Smoker  . Smokeless tobacco: Never Used  Substance and Sexual Activity  . Alcohol use: No  . Drug use: No  . Sexual activity: Not on file

## 2018-05-16 ENCOUNTER — Other Ambulatory Visit (INDEPENDENT_AMBULATORY_CARE_PROVIDER_SITE_OTHER): Payer: Self-pay

## 2018-05-16 DIAGNOSIS — Z96641 Presence of right artificial hip joint: Secondary | ICD-10-CM

## 2018-05-24 ENCOUNTER — Encounter (INDEPENDENT_AMBULATORY_CARE_PROVIDER_SITE_OTHER): Payer: Self-pay

## 2018-05-24 ENCOUNTER — Ambulatory Visit (INDEPENDENT_AMBULATORY_CARE_PROVIDER_SITE_OTHER): Payer: PRIVATE HEALTH INSURANCE | Admitting: Orthopaedic Surgery

## 2018-05-29 ENCOUNTER — Encounter (HOSPITAL_COMMUNITY): Payer: Managed Care, Other (non HMO)

## 2018-05-29 ENCOUNTER — Ambulatory Visit (HOSPITAL_COMMUNITY): Payer: Managed Care, Other (non HMO)

## 2018-06-08 ENCOUNTER — Other Ambulatory Visit (INDEPENDENT_AMBULATORY_CARE_PROVIDER_SITE_OTHER): Payer: Self-pay | Admitting: Orthopaedic Surgery

## 2018-06-08 ENCOUNTER — Encounter (INDEPENDENT_AMBULATORY_CARE_PROVIDER_SITE_OTHER): Payer: Self-pay

## 2018-06-08 MED ORDER — NABUMETONE 750 MG PO TABS: 750.0000 mg | ORAL_TABLET | Freq: Two times a day (BID) | ORAL | 3 refills | Status: DC | PRN

## 2018-06-08 MED ORDER — GABAPENTIN 300 MG PO CAPS: 600.0000 mg | ORAL_CAPSULE | Freq: Three times a day (TID) | ORAL | 3 refills | Status: DC

## 2018-06-13 ENCOUNTER — Encounter (HOSPITAL_COMMUNITY)
Admission: RE | Admit: 2018-06-13 | Discharge: 2018-06-13 | Disposition: A | Discharge status: Home / Self Care | Payer: Managed Care, Other (non HMO) | Source: Ambulatory Visit | Attending: Physician Assistant | Admitting: Physician Assistant

## 2018-06-13 ENCOUNTER — Other Ambulatory Visit: Payer: Self-pay

## 2018-06-13 DIAGNOSIS — Z96641 Presence of right artificial hip joint: Secondary | ICD-10-CM | POA: Diagnosis present

## 2018-06-13 MED ORDER — TECHNETIUM TC 99M MEDRONATE IV KIT
20.0000 | PACK | Freq: Once | INTRAVENOUS | Status: AC | PRN
  Administered 2018-06-13: 20 via INTRAVENOUS

## 2018-06-19 ENCOUNTER — Telehealth (INDEPENDENT_AMBULATORY_CARE_PROVIDER_SITE_OTHER): Payer: Self-pay | Admitting: Radiology

## 2018-06-19 NOTE — Telephone Encounter (Signed)
Called and spoke with patient, patient answered NO to all pre screening questions.  

## 2018-06-20 ENCOUNTER — Other Ambulatory Visit: Payer: Self-pay

## 2018-06-20 ENCOUNTER — Encounter (INDEPENDENT_AMBULATORY_CARE_PROVIDER_SITE_OTHER): Payer: Self-pay | Admitting: Orthopaedic Surgery

## 2018-06-20 ENCOUNTER — Ambulatory Visit (INDEPENDENT_AMBULATORY_CARE_PROVIDER_SITE_OTHER): Payer: PRIVATE HEALTH INSURANCE | Admitting: Orthopaedic Surgery

## 2018-06-20 VITALS — Ht 65.0 in | Wt 276.0 lb

## 2018-06-20 DIAGNOSIS — Z96641 Presence of right artificial hip joint: Secondary | ICD-10-CM | POA: Diagnosis not present

## 2018-06-20 DIAGNOSIS — M7061 Trochanteric bursitis, right hip: Secondary | ICD-10-CM | POA: Diagnosis not present

## 2018-06-20 DIAGNOSIS — M25551 Pain in right hip: Secondary | ICD-10-CM | POA: Diagnosis not present

## 2018-06-20 MED ORDER — LIDOCAINE HCL 1 % IJ SOLN
3.0000 mL | INTRAMUSCULAR | Status: AC | PRN
  Administered 2018-06-20: 3 mL

## 2018-06-20 MED ORDER — METHYLPREDNISOLONE ACETATE 40 MG/ML IJ SUSP
40.0000 mg | INTRAMUSCULAR | Status: AC | PRN
  Administered 2018-06-20: 40 mg via INTRA_ARTICULAR

## 2018-06-20 NOTE — Progress Notes (Signed)
Office Visit Note   Patient: Emily SaverLeslie R Reppert           Date of Birth: 03/24/1970           MRN: [id number] Visit Date: 06/20/2018              Requested by: Merri BrunetteSmith, Candace, MD [phone] [address] [place], [place] [id number] PCP: Merri BrunetteSmith, Candace, MD   Assessment & Plan: Visit Diagnoses:  1. Status post total replacement of right hip   2. Pain of right hip joint   3. Trochanteric bursitis, right hip     Plan: I did place a steroid injection around the trochanteric area of her right hip today.  I gave her stretching exercises and other instructions about looking on line in terms of trochanteric exercises with dealing with bursitis and tendinitis.  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  I would also like to obtain an MRI of her lumbar spine to rule out any nerve compression given her sciatic symptoms and an MRI of her right hip to assess for any fluid collections around the trochanteric area or the prosthesis that would give us any other indication of why she hurts still.  Follow-Up Instructions: No follow-ups on file.   Orders:  Orders Placed This Encounter  Procedures   Large Joint Inj   No orders of the defined types were placed in this encounter.     Procedures: Large Joint Inj: R greater trochanter on 06/20/2018 10:47 AM Indications: pain and diagnostic evaluation Details: 22 G 1.5 in needle, lateral approach  Arthrogram: No  Medications: 3 mL lidocaine 1 %; 40 mg methylPREDNISolone acetate 40 MG/ML Outcome: tolerated well, no immediate complications Procedure, treatment alternatives, risks and benefits explained, specific risks discussed. Consent was given by the patient. Immediately prior to procedure a time out was called to verify the correct patient, procedure, equipment, support staff and site/side marked as required. Patient was  prepped and draped in the usual sterile fashion.       Clinical Data: No additional findings.   Subjective: Chief Complaint  Patient presents with   Right Hip - Follow-up  The patient is well-known to me.  She has a history of a right total hip arthroplasty that was done in December 2018.  She has had significant hip pain since then.  She is someone who is morbidly obese with a BMI of almost 46.  The hip surgery itself was not significantly complicated for us.  She is hurt ever since the surgery.  She gets a burning sensation around the incision.  She has pain of the trochanteric area and some groin pain.  She also has some sciatic pain.  We recently sent her for a three-phase bone scan to rule out prosthetic loosening.  She walks with a slight limp but does not use assistive device.  She has been on Neurontin 3 times a day as well and we have had to increase that dose and is still does not take care of some of her symptoms.  She is worked on activity modification and anti-inflammatories as well.  It is become frustrating for her.  HPI  Review of Systems She currently denies any headache, chest pain, shortness of breath, fever, chills, vomiting, nausea  Objective: Vital Signs: Ht 5\' 5"  (1.651 m)    Wt 276 lb (125.2 kg)  BMI 45.93 kg/m   Physical Exam She is alert and orient x3 and in no acute distress Ortho Exam Examination of her right hip shows that it moves fluidly with no blocks to rotation at all.  Her incision is healed nicely.  There is no warmth or redness around the incision.  She has an abundant amount of adipose tissue over both hips.  She has significant pain to palpation of the trochanteric area of the right hip. Specialty Comments:  No specialty comments available.  Imaging: No results found. The three-phase bone scan did not show any significant uptake around the hip prosthesis to suggest prosthetic loosening.  PMFS History: Patient Active Problem List    Diagnosis Date Noted   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   Unilateral primary osteoarthritis, right hip 01/27/2017   Past Medical History:  Diagnosis Date   Arthritis    Asthma    Diabetes mellitus without complication (HCC)    type    Hypertension    Interstitial cystitis    IUD (intrauterine device) in place placed 6 weeks ago   Migraines     Family History  Problem Relation Age of Onset   Diabetes Mother    Heart disease Mother    Diabetes Father    Eczema Sister    Asthma Brother    Asthma Son    Eczema Daughter    Allergic rhinitis Neg Hx    Angioedema Neg Hx    Immunodeficiency Neg Hx    Urticaria Neg Hx     Past Surgical History:  Procedure Laterality Date   DILATION AND EVACUATION  09/30/2011   Procedure: DILATATION AND EVACUATION;  Surgeon: Tajuana Andrea, MD;  Location: [facility];  Service: Gynecology;  Laterality: N/A;   left knee surgery      arthroscopy    TOTAL HIP ARTHROPLASTY Right 03/11/2017   Procedure: RIGHT TOTAL HIP ARTHROPLASTY ANTERIOR APPROACH;  Surgeon: Kathryne Hitch, MD;  Location: [facility];  Service: Orthopedics;  Laterality: Right;   Social History   Occupational History   Not on file  Tobacco Use   Smoking status: Never Smoker   Smokeless tobacco: Never Used  Substance and Sexual Activity   Alcohol use: No   Drug use: No   Sexual activity: Not on file

## 2018-06-21 ENCOUNTER — Other Ambulatory Visit (INDEPENDENT_AMBULATORY_CARE_PROVIDER_SITE_OTHER): Payer: Self-pay

## 2018-06-21 DIAGNOSIS — Z96641 Presence of right artificial hip joint: Secondary | ICD-10-CM

## 2018-06-21 DIAGNOSIS — M4807 Spinal stenosis, lumbosacral region: Secondary | ICD-10-CM

## 2018-08-01 ENCOUNTER — Other Ambulatory Visit (INDEPENDENT_AMBULATORY_CARE_PROVIDER_SITE_OTHER): Payer: Self-pay | Admitting: Physician Assistant

## 2018-08-01 NOTE — Telephone Encounter (Signed)
Ok for refill? 

## 2018-08-24 ENCOUNTER — Ambulatory Visit
Admission: RE | Admit: 2018-08-24 | Discharge: 2018-08-24 | Disposition: A | Discharge status: Home / Self Care | Payer: Managed Care, Other (non HMO) | Source: Ambulatory Visit | Attending: Orthopaedic Surgery | Admitting: Orthopaedic Surgery

## 2018-08-24 ENCOUNTER — Other Ambulatory Visit: Payer: Self-pay

## 2018-08-24 DIAGNOSIS — Z96641 Presence of right artificial hip joint: Secondary | ICD-10-CM

## 2018-08-24 DIAGNOSIS — M4807 Spinal stenosis, lumbosacral region: Secondary | ICD-10-CM

## 2018-08-28 ENCOUNTER — Encounter: Payer: Self-pay | Admitting: Physician Assistant

## 2018-08-28 ENCOUNTER — Ambulatory Visit (INDEPENDENT_AMBULATORY_CARE_PROVIDER_SITE_OTHER): Payer: Managed Care, Other (non HMO) | Admitting: Physician Assistant

## 2018-08-28 ENCOUNTER — Other Ambulatory Visit: Payer: Self-pay

## 2018-08-28 DIAGNOSIS — M5431 Sciatica, right side: Secondary | ICD-10-CM

## 2018-08-28 DIAGNOSIS — Z96641 Presence of right artificial hip joint: Secondary | ICD-10-CM | POA: Diagnosis not present

## 2018-08-28 NOTE — Progress Notes (Signed)
HPI: Ms. Kissel returns today to go over the MRI of her lumbar spine and right hip.  She states that her right hip pain has remained unchanged since her last visit.  Pains anterior lateral aspect of the right hip.  She had a right total hip arthroplasty performed December 2018.  She has had pain right hip since that time.  No new injury.  She states that the injection lateral aspect of the right hip i.e. trochanteric injection gave her some minimal relief.  She denies any low back pain.  Is been taking Neurontin and also using nabumetone is unsure if these are helping at all. Lumbar spine dated 08/24/2018 is reviewed with the patient actual images reviewed with patient.  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  L1-L2 loss of disc height small foraminal protrusion on the left.  Otherwise no cord compression or foraminal narrowing.  Remaining levels unremarkable. Right hip MRI showed no loosening of the right total hip arthroplasty.  No evidence of stress fracture.  Small joint effusion synovitis felt to be consistent with iliopsoas bursitis.  Remaining surrounding hip and pelvic muscular structures normal appearance.  Review of systems:No fevers or chills. See HPI  Impression: Status post right total hip arthroplasty December 2018 Right sciatic-like symptoms involving the thigh.  Plan: Discussed with patient did not recommend a right S1 nerve root injection as discussed by the radiologist and see if this helps at all with her pain.  Hopefully this will be therapeutic as well as diagnostic.  Questions encouraged and answered at length.  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  Follow-up with [name] in 1 month.

## 2018-08-30 DIAGNOSIS — G43909 Migraine, unspecified, not intractable, without status migrainosus: Secondary | ICD-10-CM | POA: Insufficient documentation

## 2018-08-30 DIAGNOSIS — J45909 Unspecified asthma, uncomplicated: Secondary | ICD-10-CM | POA: Insufficient documentation

## 2018-08-30 DIAGNOSIS — I10 Essential (primary) hypertension: Secondary | ICD-10-CM | POA: Insufficient documentation

## 2018-09-04 ENCOUNTER — Other Ambulatory Visit: Payer: Self-pay

## 2018-09-04 ENCOUNTER — Other Ambulatory Visit: Payer: Self-pay | Admitting: Obstetrics and Gynecology

## 2018-09-04 DIAGNOSIS — M5431 Sciatica, right side: Secondary | ICD-10-CM

## 2018-09-04 DIAGNOSIS — R928 Other abnormal and inconclusive findings on diagnostic imaging of breast: Secondary | ICD-10-CM

## 2018-09-05 ENCOUNTER — Ambulatory Visit
Admission: RE | Admit: 2018-09-05 | Discharge: 2018-09-05 | Disposition: A | Discharge status: Home / Self Care | Payer: Managed Care, Other (non HMO) | Source: Ambulatory Visit | Attending: Obstetrics and Gynecology | Admitting: Obstetrics and Gynecology

## 2018-09-05 ENCOUNTER — Other Ambulatory Visit: Payer: Self-pay

## 2018-09-05 DIAGNOSIS — R928 Other abnormal and inconclusive findings on diagnostic imaging of breast: Secondary | ICD-10-CM

## 2018-09-12 ENCOUNTER — Encounter: Payer: Self-pay | Admitting: Physical Medicine and Rehabilitation

## 2018-09-12 ENCOUNTER — Telehealth: Payer: Self-pay | Admitting: *Deleted

## 2018-09-12 ENCOUNTER — Ambulatory Visit (INDEPENDENT_AMBULATORY_CARE_PROVIDER_SITE_OTHER): Payer: Managed Care, Other (non HMO) | Admitting: Physical Medicine and Rehabilitation

## 2018-09-12 ENCOUNTER — Other Ambulatory Visit: Payer: Self-pay

## 2018-09-12 ENCOUNTER — Ambulatory Visit: Payer: Self-pay

## 2018-09-12 VITALS — BP 154/93 | HR 87

## 2018-09-12 DIAGNOSIS — M5416 Radiculopathy, lumbar region: Secondary | ICD-10-CM

## 2018-09-12 MED ORDER — BETAMETHASONE SOD PHOS & ACET 6 (3-3) MG/ML IJ SUSP
12.0000 mg | Freq: Once | INTRAMUSCULAR | Status: AC
  Administered 2018-09-12: 12 mg

## 2018-09-12 NOTE — Progress Notes (Signed)
 .  Numeric Pain Rating Scale and Functional Assessment Average Pain 7   In the last MONTH (on 0-10 scale) has pain interfered with the following?  1. General activity like being  able to carry out your everyday physical activities such as walking, climbing stairs, carrying groceries, or moving a chair?  Rating(6)   +Driver, -BT, -Dye Allergies.  

## 2018-09-12 NOTE — Telephone Encounter (Signed)
Please advise 

## 2018-09-13 MED ORDER — NABUMETONE 750 MG PO TABS: 750.0000 mg | ORAL_TABLET | Freq: Two times a day (BID) | ORAL | 3 refills | Status: DC | PRN

## 2018-09-13 NOTE — Progress Notes (Signed)
Emily Hester - 48 y.o. female MRN [id number]  Date of birth: [birth year]  Office Visit Note: Visit Date: 09/12/2018 PCP: Emily Ada, MD Referred by: Emily Ada, MD  Subjective: Chief Complaint  Patient presents with  . Lower Back - Pain   HPI:  Emily Hester is a 48 y.o. female who comes in today At the request of Dr. Jean Hester for right S1 transforaminal epidural steroid injection diagnostically and therapeutically.  Patient's had MRI lumbar spine showing annular tear at L5-S1 which could be a source of radiculitis.  Dr. Jola Hester note actually suggested right S1 transforaminal injection diagnostically and I do concur with that.  Her case is complicated by a prior total hip replacement and morbid obesity.  ROS Otherwise per HPI.  Assessment & Plan: Visit Diagnoses:  1. Lumbar radiculopathy     Plan: No additional findings.   Meds & Orders:  Meds ordered this encounter  Medications  . betamethasone acetate-betamethasone sodium phosphate (CELESTONE) injection 12 mg    Orders Placed This Encounter  Procedures  . XR C-ARM NO REPORT  . Epidural Steroid injection    Follow-up: Return if symptoms worsen or fail to improve.   Procedures: No procedures performed  S1 Lumbosacral Transforaminal Epidural Steroid Injection - Sub-Pedicular Approach with Fluoroscopic Guidance   [name]      Date of Birth: 16-Jun-1970 MRN: [id number] PCP: Emily Ada, MD      Visit Date: 09/12/2018   Universal Protocol:    Date/Time: 07/01/208:15 AM  Consent Given By: the patient  Position:  PRONE  Additional Comments: Vital signs were monitored before and after the procedure. Patient was prepped and draped in the usual sterile fashion. The correct patient, procedure, and site was verified.   Injection Procedure Details:  Procedure Site One Meds Administered:  Meds ordered this encounter  Medications  . betamethasone  acetate-betamethasone sodium phosphate (CELESTONE) injection 12 mg    Laterality: Right  Location/Site:  S1 Foramen   Needle size: 22 ga.  Needle type: Spinal  Needle Placement: Transforaminal  Findings:   -Comments: Excellent flow of contrast along the nerve and into the epidural space.  Procedure Details: After squaring off the sacral end-plate to get a true AP view, the C-arm was positioned so that the best possible view of the S1 foramen was visualized. The soft tissues overlying this structure were infiltrated with 2-3 ml. of 1% Lidocaine without Epinephrine.    The spinal needle was inserted toward the target using a "trajectory" view along the fluoroscope beam.  Under AP and lateral visualization, the needle was advanced so it did not puncture dura. Biplanar projections were used to confirm position. Aspiration was confirmed to be negative for CSF and/or blood. A 1-2 ml. volume of Isovue-250 was injected and flow of contrast was noted at each level. Radiographs were obtained for documentation purposes.   After attaining the desired flow of contrast documented above, a 0.5 to 1.0 ml test dose of 0.25% Marcaine was injected into each respective transforaminal space.  The patient was observed for 90 seconds post injection.  After no sensory deficits were reported, and normal lower extremity motor function was noted,   the above injectate was administered so that equal amounts of the injectate were placed at each foramen (level) into the transforaminal epidural space.   Additional Comments:  The patient tolerated the procedure well Dressing: Band-Aid with 2 x 2 sterile gauze    Post-procedure details: Patient was observed during  the procedure. Post-procedure instructions were reviewed.  Patient left the clinic in stable condition.    Clinical History: MRI LUMBAR SPINE WITHOUT CONTRAST  TECHNIQUE: Multiplanar, multisequence MR imaging of the lumbar spine was performed.  No intravenous contrast was administered.  COMPARISON:  07/28/2016.  FINDINGS: Segmentation:  Standard.  Alignment:  Physiologic.  Vertebrae: Mild endplate changes are stable. No fracture, evidence of diskitis, or bone lesion.  Conus medullaris and cauda equina: Conus extends to the T12-L1 level. Conus and cauda equina appear normal.  Paraspinal and other soft tissues: Is increased body habitus without features of epidural lipomatosis.  Disc levels:  L1-L2: Loss of disc height with preserved disc signal. Small foraminal protrusion on the LEFT. No definite compressive subarticular zone or foraminal zone narrowing.  L2-L3:  Unremarkable.  L3-L4: Unremarkable disc space. Facet arthropathy. No impingement.  L4-L5: Unremarkable disc space. Facet arthropathy. No impingement.  L5-S1: RIGHT L5-S1 annular rent. Disc desiccation. Mild facet arthropathy. No stenosis, or neural impingement. Specifically, no RIGHT-sided compressive lesion  Compared with priors, similar appearance.  IMPRESSION: RIGHT L5-S1 annular rent, is not associated with a disc protrusion nor neural impingement. The appearance is fairly similar to 2018. This abnormality could serve as a source of RIGHT-sided radicular pain.  Consider lumbar epidural RIGHT S1 nerve root block with transforaminal steroid injection as a diagnostic and potentially therapeutic maneuver.  Small foraminal protrusion L1-2 on the LEFT, doubt significance.  Lower lumbar facet arthropathy without significant stenosis or RIGHT-sided impingement.   Electronically Signed   By: Emily Hester M.D.   On: 08/24/2018 16:03     Objective:  VS:  HT:    WT:   BMI:     BP:(!) 154/93  HR:87bpm  TEMP: ( )  RESP:  Physical Exam  Ortho Exam Imaging: No results found.

## 2018-09-13 NOTE — Procedures (Signed)
S1 Lumbosacral Transforaminal Epidural Steroid Injection - Sub-Pedicular Approach with Fluoroscopic Guidance   [name]      Date of Birth: 10/22/1970 MRN: [id number] PCP: Carol Ada, MD      Visit Date: 09/12/2018   Universal Protocol:    Date/Time: 07/01/208:15 AM  Consent Given By: the patient  Position:  PRONE  Additional Comments: Vital signs were monitored before and after the procedure. Patient was prepped and draped in the usual sterile fashion. The correct patient, procedure, and site was verified.   Injection Procedure Details:  Procedure Site One Meds Administered:  Meds ordered this encounter  Medications  . betamethasone acetate-betamethasone sodium phosphate (CELESTONE) injection 12 mg    Laterality: Right  Location/Site:  S1 Foramen   Needle size: 22 ga.  Needle type: Spinal  Needle Placement: Transforaminal  Findings:   -Comments: Excellent flow of contrast along the nerve and into the epidural space.  Procedure Details: After squaring off the sacral end-plate to get a true AP view, the C-arm was positioned so that the best possible view of the S1 foramen was visualized. The soft tissues overlying this structure were infiltrated with 2-3 ml. of 1% Lidocaine without Epinephrine.    The spinal needle was inserted toward the target using a "trajectory" view along the fluoroscope beam.  Under AP and lateral visualization, the needle was advanced so it did not puncture dura. Biplanar projections were used to confirm position. Aspiration was confirmed to be negative for CSF and/or blood. A 1-2 ml. volume of Isovue-250 was injected and flow of contrast was noted at each level. Radiographs were obtained for documentation purposes.   After attaining the desired flow of contrast documented above, a 0.5 to 1.0 ml test dose of 0.25% Marcaine was injected into each respective transforaminal space.  The patient was observed for 90 seconds post  injection.  After no sensory deficits were reported, and normal lower extremity motor function was noted,   the above injectate was administered so that equal amounts of the injectate were placed at each foramen (level) into the transforaminal epidural space.   Additional Comments:  The patient tolerated the procedure well Dressing: Band-Aid with 2 x 2 sterile gauze    Post-procedure details: Patient was observed during the procedure. Post-procedure instructions were reviewed.  Patient left the clinic in stable condition.

## 2018-09-25 ENCOUNTER — Ambulatory Visit: Payer: Managed Care, Other (non HMO) | Admitting: Orthopaedic Surgery

## 2018-09-25 ENCOUNTER — Telehealth: Payer: Self-pay | Admitting: Orthopaedic Surgery

## 2018-09-25 NOTE — Telephone Encounter (Signed)
I would say that she is not cleared for return to work due to the pain she is having as it relates to her hip replacement and is trying to figure out if it is failure of her implants versus other issues such as back pain and sciatica that is referring pain to her hip.  Regardless, she has not been able to work as a result of the pain she is having.

## 2018-09-25 NOTE — Telephone Encounter (Signed)
Emily Hester with [employer] called in wanting to get clarification that the pt is cleared to return to work as of 08-29-2018.   [phone]

## 2018-09-25 NOTE — Telephone Encounter (Signed)
I am getting a lot of questions from [employer] for her Patient is saying that she "retired" from her job position and is not going back But they are wondering if she is cleared to return?

## 2018-09-26 NOTE — Telephone Encounter (Signed)
Filled out [employer] paperwork and faxed this information to them

## 2018-10-04 ENCOUNTER — Ambulatory Visit (INDEPENDENT_AMBULATORY_CARE_PROVIDER_SITE_OTHER): Payer: Managed Care, Other (non HMO) | Admitting: Orthopaedic Surgery

## 2018-10-04 ENCOUNTER — Other Ambulatory Visit: Payer: Self-pay

## 2018-10-04 ENCOUNTER — Encounter: Payer: Self-pay | Admitting: Orthopaedic Surgery

## 2018-10-04 DIAGNOSIS — Z96641 Presence of right artificial hip joint: Secondary | ICD-10-CM

## 2018-10-04 DIAGNOSIS — M5431 Sciatica, right side: Secondary | ICD-10-CM | POA: Diagnosis not present

## 2018-10-04 DIAGNOSIS — M25551 Pain in right hip: Secondary | ICD-10-CM

## 2018-10-04 MED ORDER — TRAMADOL HCL 50 MG PO TABS: 50.0000 mg | ORAL_TABLET | Freq: Four times a day (QID) | ORAL | 0 refills | Status: DC | PRN

## 2018-10-04 MED ORDER — TIZANIDINE HCL 4 MG PO CAPS: ORAL_CAPSULE | ORAL | 0 refills | Status: DC

## 2018-10-04 NOTE — Progress Notes (Signed)
The patient still comes in with a painful right total hip arthroplasty.  We replaced her right hip in December 2018.  She still has a lot of thigh and groin pain.  A three-phase bone scan as well as an MRI did not show any evidence of prosthetic loosening.  An MRI of her lumbar spine did show some potential disc issues of the lower lumbar spine.  She recently had an epidural steroid injection to the right-sided S1 by Dr. Ernestina Patches.  She says that is helped quite a bit in terms of some of the pain that she was having is not as bad as it was before however she still having groin pain and thigh pain.  At some point she has to get around using her cane.  She is someone who does weigh 276 pounds.  She does take anti-inflammatories and has worked on activity modification and weight loss.  On exam her right hip still moves fluidly.  She does report some groin pain and thigh pain.  At this point I would like to obtain a CT scan of her right hip just to see if we can better assess the interface between the acetabular opponent and the bone in the femoral component of the bone.  I had a long thorough discussion with her about painful hip and total joint prosthesis in general.  I would certainly want to avoid any revision surgery until we have exhausted all conservative treatment measures as does she.  We will go over CT scan with her of her right hip when she comes back to see me in follow-up.  All questions were answered addressed.  I will refill her tramadol and Zanaflex.

## 2018-10-04 NOTE — Addendum Note (Signed)
Addended by: Marlyne Beards on: 10/04/2018 03:54 PM   Modules accepted: Orders

## 2018-10-16 ENCOUNTER — Encounter: Payer: Self-pay | Admitting: Orthopaedic Surgery

## 2018-10-25 ENCOUNTER — Encounter: Payer: Self-pay | Admitting: Orthopaedic Surgery

## 2018-10-25 ENCOUNTER — Ambulatory Visit: Payer: Managed Care, Other (non HMO) | Admitting: Orthopaedic Surgery

## 2018-10-31 ENCOUNTER — Ambulatory Visit
Admission: RE | Admit: 2018-10-31 | Discharge: 2018-10-31 | Disposition: A | Discharge status: Home / Self Care | Payer: Managed Care, Other (non HMO) | Source: Ambulatory Visit | Attending: Orthopaedic Surgery | Admitting: Orthopaedic Surgery

## 2018-10-31 ENCOUNTER — Other Ambulatory Visit: Payer: Self-pay

## 2018-10-31 DIAGNOSIS — Z96641 Presence of right artificial hip joint: Secondary | ICD-10-CM

## 2018-11-02 ENCOUNTER — Ambulatory Visit (INDEPENDENT_AMBULATORY_CARE_PROVIDER_SITE_OTHER): Payer: Managed Care, Other (non HMO) | Admitting: Orthopaedic Surgery

## 2018-11-02 ENCOUNTER — Encounter: Payer: Self-pay | Admitting: Orthopaedic Surgery

## 2018-11-02 DIAGNOSIS — Z96641 Presence of right artificial hip joint: Secondary | ICD-10-CM

## 2018-11-02 DIAGNOSIS — M25551 Pain in right hip: Secondary | ICD-10-CM

## 2018-11-02 NOTE — Progress Notes (Signed)
The patient continues to follow-up with a painful right total hip arthroplasty.  It has been close to 2 years since replaced her right hip.  She has had pain ever since then.  I have obtained earlier this year a three-phase bone scan as well as an MRI of the right hip and it did not show any evidence of prosthetic loosening.  I did not send her for CT scan and she is here for review this today.  The CT scan does show some bone reabsorption around the proximal aspect of the component on the femur side but otherwise really no evidence of any other complicating features or fracture.  It does appear to be significantly grown into her bone on the acetabular side and femoral side.  On exam she does have severe pain to palpation of the trochanteric area.  I talked her about trying a steroid injection in this area and she agreed to this.  I will also have her start placing Voltaren gel in this area at least twice daily.  From my standpoint, I would like a least a one-time intra-articular steroid injection in her right hip joint under direct fluoroscopy by Dr. Ernestina Patches.  The other thing that her her MRI and CT have shown is findings suggesting synovitis and fluid in the iliopsoas bursa.  I will see her back myself in 4 weeks and hopefully she will had intervention by Dr. Ernestina Patches and we can see between his injection and my injection as well as the Voltaren gel if this is probably helped her.  All question concerns were answered and addressed.

## 2018-11-03 ENCOUNTER — Other Ambulatory Visit: Payer: Self-pay

## 2018-11-03 DIAGNOSIS — M25551 Pain in right hip: Secondary | ICD-10-CM

## 2018-11-10 ENCOUNTER — Other Ambulatory Visit: Payer: Self-pay | Admitting: Orthopaedic Surgery

## 2018-11-10 ENCOUNTER — Ambulatory Visit: Payer: Self-pay

## 2018-11-10 ENCOUNTER — Ambulatory Visit (INDEPENDENT_AMBULATORY_CARE_PROVIDER_SITE_OTHER): Payer: Managed Care, Other (non HMO) | Admitting: Physical Medicine and Rehabilitation

## 2018-11-10 ENCOUNTER — Encounter: Payer: Self-pay | Admitting: Physical Medicine and Rehabilitation

## 2018-11-10 DIAGNOSIS — M25551 Pain in right hip: Secondary | ICD-10-CM

## 2018-11-10 MED ORDER — TRAMADOL HCL 50 MG PO TABS: 50.0000 mg | ORAL_TABLET | Freq: Four times a day (QID) | ORAL | 0 refills | Status: DC | PRN

## 2018-11-10 MED ORDER — TIZANIDINE HCL 4 MG PO CAPS: ORAL_CAPSULE | ORAL | 0 refills | Status: DC

## 2018-11-10 NOTE — Progress Notes (Signed)
 .  Numeric Pain Rating Scale and Functional Assessment Average Pain 6   In the last MONTH (on 0-10 scale) has pain interfered with the following?  1. General activity like being  able to carry out your everyday physical activities such as walking, climbing stairs, carrying groceries, or moving a chair?  Rating(7)   -Dye Allergies.  

## 2018-11-10 NOTE — Telephone Encounter (Signed)
Please advise 

## 2018-11-14 DIAGNOSIS — M25551 Pain in right hip: Secondary | ICD-10-CM

## 2018-11-14 MED ORDER — TRIAMCINOLONE ACETONIDE 40 MG/ML IJ SUSP
80.0000 mg | INTRAMUSCULAR | Status: AC | PRN
  Administered 2018-11-14: 80 mg via INTRA_ARTICULAR

## 2018-11-14 MED ORDER — BUPIVACAINE HCL 0.25 % IJ SOLN
4.0000 mL | INTRAMUSCULAR | Status: AC | PRN
  Administered 2018-11-14: 09:00:00 4 mL via INTRA_ARTICULAR

## 2018-11-14 NOTE — Progress Notes (Signed)
Emily Hester - 48 y.o. female MRN [id number]  Date of birth: 04/10/1970  Office Visit Note: Visit Date: 11/10/2018 PCP: Merri BrunetteSmith, Candace, MD Referred by: Merri BrunetteSmith, Candace, MD  Subjective: Chief Complaint  Patient presents with  . Right Hip - Pain   HPI:  Emily Hester is a 48 y.o. female who comes in today For planned intra-articular if he will right hip injection fluoroscopic guidance.  Patient is status post right total hip arthroplasty with continued pain with hip and groin for flexion.  MRI findings of shown small effusion around the mechanical joint itself.  Spoke with Dr. Magnus IvanBlackman he does want to try to see if we can aspirate this area and do actual injection as he did not feel like there is any source of infection.  She also has small communicating effusion potentially with the bursa of the iliopsoas.  Similar pain can be from iliopsoas bursitis.  Depending on relief would suggest iliopsoas injection.  She also continues to have lateral hip pain with fluid collection there.  Dr. Magnus IvanBlackman is injected that himself.  Also would be happy to try that with fluoroscopic guidance do to patient's body habitus.  ROS Otherwise per HPI.  Assessment & Plan: Visit Diagnoses:  1. Pain in right hip     Plan: No additional findings.   Meds & Orders: No orders of the defined types were placed in this encounter.   Orders Placed This Encounter  Procedures  . Large Joint Inj  . Large Joint Inj  . XR C-ARM NO REPORT    Follow-up: Return for Doneen Poissonhristopher Blackman, M.D..   Procedures: Large Joint Inj: R hip joint on 11/14/2018 8:52 AM Indications: diagnostic evaluation and pain Details: 22 G 3.5 in needle, fluoroscopy-guided anterior approach  Arthrogram: No  Medications: 80 mg triamcinolone acetonide 40 MG/ML; 4 mL bupivacaine 0.25 % Aspirate: 3 mL serous and yellow Outcome: tolerated well, no immediate complications  There was excellent flow of contrast showing no intravascular  spread.  Did obtain 3 mL of serous normal-appearing somewhat yellow fluid.  Patient appeared to have a mild relief during the anesthetic phase. Procedure, treatment alternatives, risks and benefits explained, specific risks discussed. Consent was given by the patient. Immediately prior to procedure a time out was called to verify the correct patient, procedure, equipment, support staff and site/side marked as required. Patient was prepped and draped in the usual sterile fashion.      No notes on file   Clinical History: MRI LUMBAR SPINE WITHOUT CONTRAST  TECHNIQUE: Multiplanar, multisequence MR imaging of the lumbar spine was performed. No intravenous contrast was administered.  COMPARISON:  07/28/2016.  FINDINGS: Segmentation:  Standard.  Alignment:  Physiologic.  Vertebrae: Mild endplate changes are stable. No fracture, evidence of diskitis, or bone lesion.  Conus medullaris and cauda equina: Conus extends to the T12-L1 level. Conus and cauda equina appear normal.  Paraspinal and other soft tissues: Is increased body habitus without features of epidural lipomatosis.  Disc levels:  L1-L2: Loss of disc height with preserved disc signal. Small foraminal protrusion on the LEFT. No definite compressive subarticular zone or foraminal zone narrowing.  L2-L3:  Unremarkable.  L3-L4: Unremarkable disc space. Facet arthropathy. No impingement.  L4-L5: Unremarkable disc space. Facet arthropathy. No impingement.  L5-S1: RIGHT L5-S1 annular rent. Disc desiccation. Mild facet arthropathy. No stenosis, or neural impingement. Specifically, no RIGHT-sided compressive lesion  Compared with priors, similar appearance.  IMPRESSION: RIGHT L5-S1 annular rent, is not associated with a disc  protrusion nor neural impingement. The appearance is fairly similar to 2018. This abnormality could serve as a source of RIGHT-sided radicular pain.  Consider lumbar epidural RIGHT  S1 nerve root block with transforaminal steroid injection as a diagnostic and potentially therapeutic maneuver.  Small foraminal protrusion L1-2 on the LEFT, doubt significance.  Lower lumbar facet arthropathy without significant stenosis or RIGHT-sided impingement.   Electronically Signed   By: Staci Righter M.D.   On: 08/24/2018 16:03     Objective:  VS:  HT:    WT:   BMI:     BP:   HR: bpm  TEMP: ( )  RESP:  Physical Exam  Ortho Exam Imaging: No results found.

## 2018-11-30 ENCOUNTER — Ambulatory Visit: Payer: Managed Care, Other (non HMO) | Admitting: Orthopaedic Surgery

## 2018-12-04 ENCOUNTER — Encounter: Payer: Self-pay | Admitting: Orthopaedic Surgery

## 2018-12-07 ENCOUNTER — Ambulatory Visit (INDEPENDENT_AMBULATORY_CARE_PROVIDER_SITE_OTHER): Payer: Managed Care, Other (non HMO) | Admitting: Physical Medicine and Rehabilitation

## 2018-12-07 ENCOUNTER — Ambulatory Visit: Payer: Self-pay

## 2018-12-07 ENCOUNTER — Ambulatory Visit (INDEPENDENT_AMBULATORY_CARE_PROVIDER_SITE_OTHER): Payer: Managed Care, Other (non HMO) | Admitting: Orthopaedic Surgery

## 2018-12-07 ENCOUNTER — Encounter: Payer: Self-pay | Admitting: Orthopaedic Surgery

## 2018-12-07 ENCOUNTER — Other Ambulatory Visit: Payer: Self-pay

## 2018-12-07 ENCOUNTER — Encounter: Payer: Self-pay | Admitting: Physical Medicine and Rehabilitation

## 2018-12-07 DIAGNOSIS — M7061 Trochanteric bursitis, right hip: Secondary | ICD-10-CM | POA: Diagnosis not present

## 2018-12-07 DIAGNOSIS — M25551 Pain in right hip: Secondary | ICD-10-CM | POA: Diagnosis not present

## 2018-12-07 DIAGNOSIS — Z96641 Presence of right artificial hip joint: Secondary | ICD-10-CM

## 2018-12-07 MED ORDER — TRAMADOL HCL 50 MG PO TABS: 50.0000 mg | ORAL_TABLET | Freq: Four times a day (QID) | ORAL | 0 refills | Status: DC | PRN

## 2018-12-07 MED ORDER — TRIAMCINOLONE ACETONIDE 40 MG/ML IJ SUSP
60.0000 mg | INTRAMUSCULAR | Status: AC | PRN
  Administered 2018-12-07: 60 mg via INTRA_ARTICULAR

## 2018-12-07 MED ORDER — BUPIVACAINE HCL 0.25 % IJ SOLN
4.0000 mL | INTRAMUSCULAR | Status: AC | PRN
  Administered 2018-12-07: 16:00:00 4 mL via INTRA_ARTICULAR

## 2018-12-07 MED ORDER — TIZANIDINE HCL 4 MG PO TABS: 4.0000 mg | ORAL_TABLET | Freq: Two times a day (BID) | ORAL | 0 refills | Status: DC | PRN

## 2018-12-07 NOTE — Progress Notes (Signed)
  Numeric Pain Rating Scale and Functional Assessment Average Pain 10   In the last MONTH (on 0-10 scale) has pain interfered with the following?  1. General activity like being  able to carry out your everyday physical activities such as walking, climbing stairs, carrying groceries, or moving a chair?  Rating(10)    -Dye Allergies. 

## 2018-12-07 NOTE — Progress Notes (Signed)
The patient is still dealing with quite severe right hip pain over the trochanteric area.  She has a history of a right total hip arthroplasty that we did in December 2018.  She still has severe pain on the outside of her hip.  She is having to offload her hip using a cane.  She is someone who is morbidly obese.  She is tried and failed all forms of conservative treatment.  Today she had a steroid injection under fluoroscopy in her trochanteric area.  She is still tender from that injection.  On examination her right hip moves fluidly.  She is very tender to palpation of the trochanteric area.  I will have her continue to use her cane in her opposite hand.  We will continue her tramadol and Zanaflex and Voltaren gel.  We will see her back in 4 weeks to see how she is responded to this.  My only other option at this point will be a surgical intervention.  We will try to keep this minimal and just perform a trochanteric bursectomy with debridement of the IT band versus potentially revision of the femoral component.  We will see how she is doing in 4 weeks.

## 2018-12-07 NOTE — Progress Notes (Signed)
Emily Hester - 48 y.o. female MRN [id number]  Date of birth: 1970/10/08  Office Visit Note: Visit Date: 12/07/2018 PCP: Merri Brunette, MD Referred by: Merri Brunette, MD  Subjective: Chief Complaint  Patient presents with  . Right Hip - Pain   HPI:  Emily Hester is a 48 y.o. female who comes in today For planned right greater trochanteric bursa injection with fluoroscopic guidance do to body habitus.  Patient follows with Dr. Magnus Ivan.  She has had a prior right total hip replacement with ongoing hip pain.  Recently we completed intra-articular injection into the prosthesis.  Those notes can be reviewed.  At that time she was having lateral lateral hip pain is phoned into Dr. Magnus Ivan about that and we decided to try to complete greater trochanteric injection.  She will follow-up with Dr. Magnus Ivan for questions about her hip.  ROS Otherwise per HPI.  Assessment & Plan: Visit Diagnoses:  1. Greater trochanteric bursitis, right     Plan: No additional findings.   Meds & Orders: No orders of the defined types were placed in this encounter.   Orders Placed This Encounter  Procedures  . Large Joint Inj  . XR C-ARM NO REPORT    Follow-up: Return if symptoms worsen or fail to improve, for [name], M.D..   Procedures: Large Joint Inj: R hip joint on 12/07/2018 4:15 PM Indications: pain and diagnostic evaluation Details: 22 G 3.5 in needle, fluoroscopy-guided lateral approach  Arthrogram: No  Medications: 4 mL bupivacaine 0.25 %; 60 mg triamcinolone acetonide 40 MG/ML Outcome: tolerated well, no immediate complications  There was excellent flow of contrast outlined the greater trochanteric bursa without vascular uptake. Procedure, treatment alternatives, risks and benefits explained, specific risks discussed. Consent was given by the patient. Immediately prior to procedure a time out was called to verify the correct patient, procedure, equipment, support  staff and site/side marked as required. Patient was prepped and draped in the usual sterile fashion.      No notes on file   Clinical History: MRI LUMBAR SPINE WITHOUT CONTRAST  TECHNIQUE: Multiplanar, multisequence MR imaging of the lumbar spine was performed. No intravenous contrast was administered.  COMPARISON:  07/28/2016.  FINDINGS: Segmentation:  Standard.  Alignment:  Physiologic.  Vertebrae: Mild endplate changes are stable. No fracture, evidence of diskitis, or bone lesion.  Conus medullaris and cauda equina: Conus extends to the T12-L1 level. Conus and cauda equina appear normal.  Paraspinal and other soft tissues: Is increased body habitus without features of epidural lipomatosis.  Disc levels:  L1-L2: Loss of disc height with preserved disc signal. Small foraminal protrusion on the LEFT. No definite compressive subarticular zone or foraminal zone narrowing.  L2-L3:  Unremarkable.  L3-L4: Unremarkable disc space. Facet arthropathy. No impingement.  L4-L5: Unremarkable disc space. Facet arthropathy. No impingement.  L5-S1: RIGHT L5-S1 annular rent. Disc desiccation. Mild facet arthropathy. No stenosis, or neural impingement. Specifically, no RIGHT-sided compressive lesion  Compared with priors, similar appearance.  IMPRESSION: RIGHT L5-S1 annular rent, is not associated with a disc protrusion nor neural impingement. The appearance is fairly similar to 2018. This abnormality could serve as a source of RIGHT-sided radicular pain.  Consider lumbar epidural RIGHT S1 nerve root block with transforaminal steroid injection as a diagnostic and potentially therapeutic maneuver.  Small foraminal protrusion L1-2 on the LEFT, doubt significance.  Lower lumbar facet arthropathy without significant stenosis or RIGHT-sided impingement.   Electronically Signed   By: Dale Tavistock.D.  On: 08/24/2018 16:03     Objective:  VS:  HT:     WT:   BMI:     BP:   HR: bpm  TEMP: ( )  RESP:  Physical Exam Vitals signs and nursing note reviewed.  Constitutional:      Appearance: Normal appearance. She is obese.  Musculoskeletal:     Comments: Patient has pain with movement of the right hip pain to palpation over the right greater trochanter.  She has good distal strength.  Skin:    Findings: No erythema or rash.  Neurological:     General: No focal deficit present.     Mental Status: She is alert and oriented to person, place, and time.     Gait: Gait abnormal.  Psychiatric:        Mood and Affect: Mood normal.        Behavior: Behavior normal.     Ortho Exam Imaging: Xr C-arm No Report  Result Date: 12/07/2018 Please see Notes tab for imaging impression.

## 2018-12-13 ENCOUNTER — Other Ambulatory Visit: Payer: Self-pay | Admitting: Orthopaedic Surgery

## 2018-12-13 NOTE — Telephone Encounter (Signed)
Please advise 

## 2018-12-28 IMAGING — MR MR LUMBAR SPINE W/O CM
4 of 5 series · 26 of 48 positions shown · non-contrast
Comparison: CT abdomen and pelvis April 16, 2012

CLINICAL DATA: Chronic back and RIGHT hip pain radiating to RIGHT
foot. Symptoms not improved after hip injection.

EXAM:
MRI LUMBAR SPINE WITHOUT CONTRAST
TECHNIQUE: Multiplanar, multisequence MR imaging of the lumbar spine was
performed. No intravenous contrast was administered.

[Series 3: T2 · sagittal · 4.0mm · 0.55mm/px · 6 of 13 slices shown (1 of 2)]
[im 1/13]
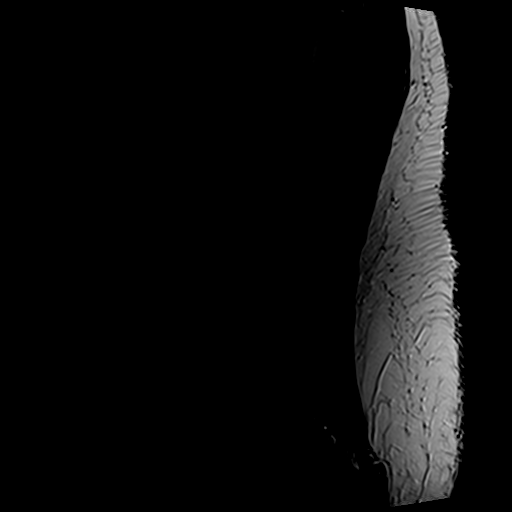
[im 3/13]
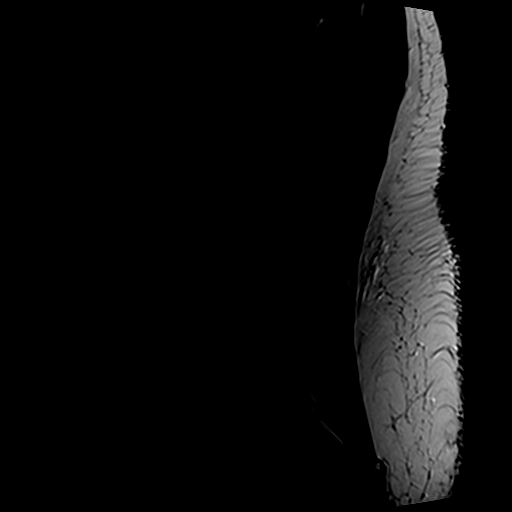
[im 5/13]
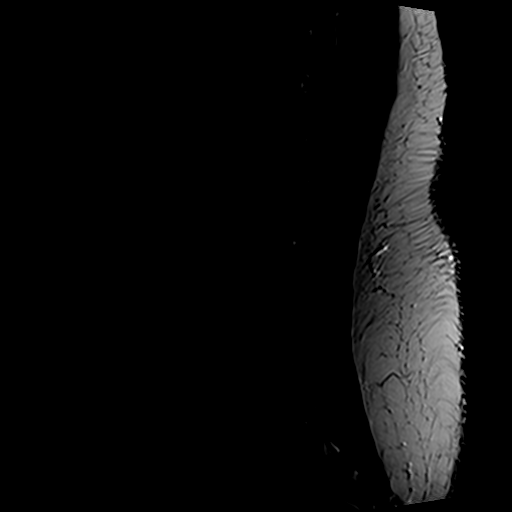
[im 8/13]
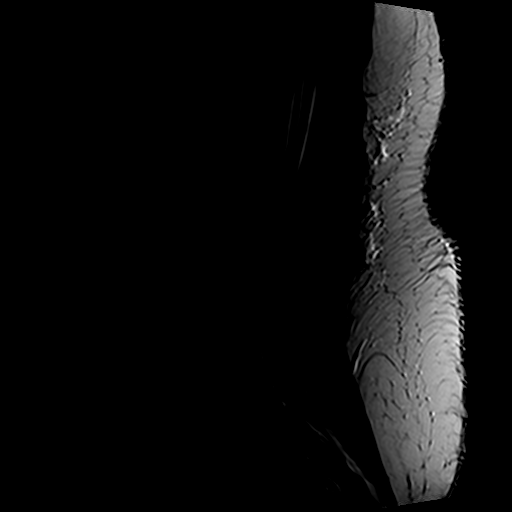
[im 10/13]
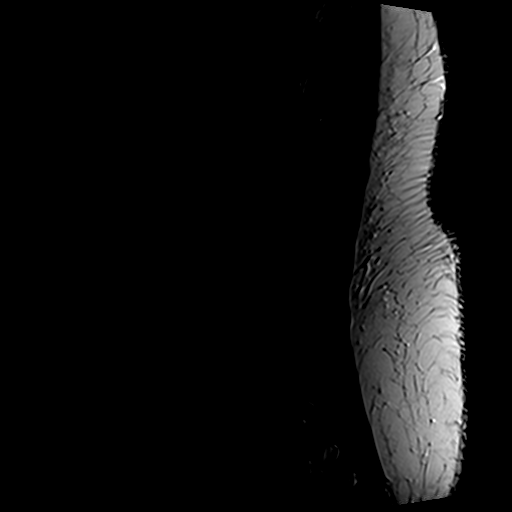
[im 13/13]
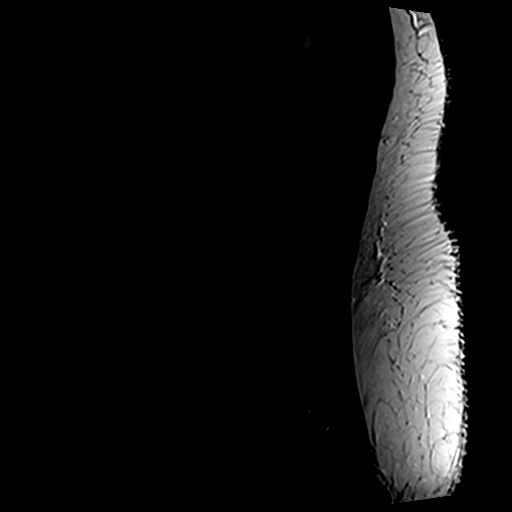

[Series 4: T1 · sagittal · 4.0mm · 0.55mm/px · 6 of 13 slices shown (1 of 2)]
[im 1/13]
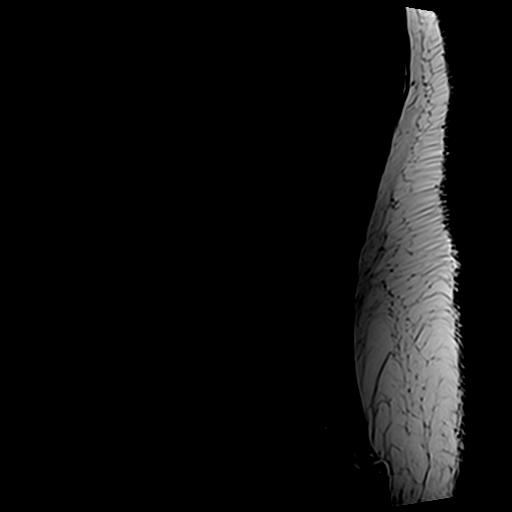
[im 3/13]
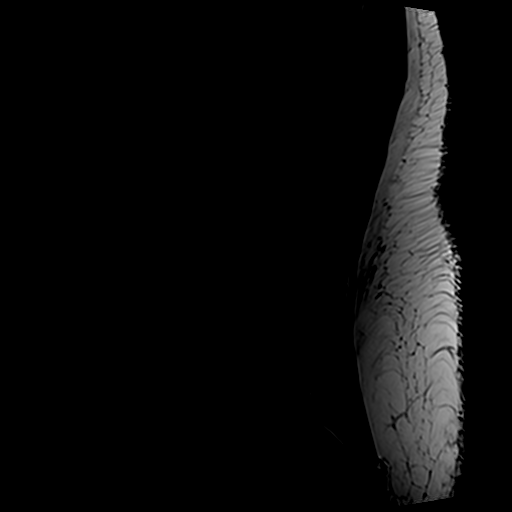
[im 5/13]
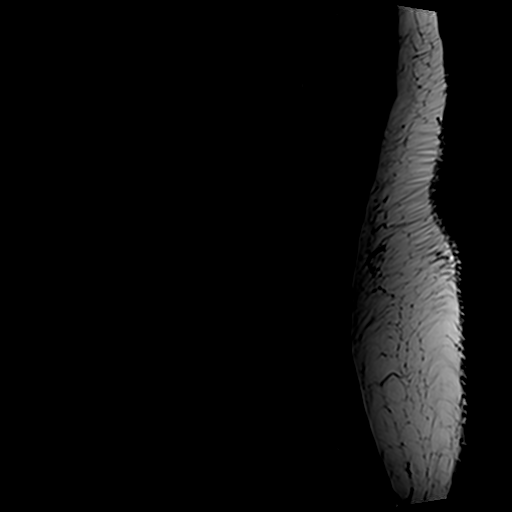
[im 8/13]
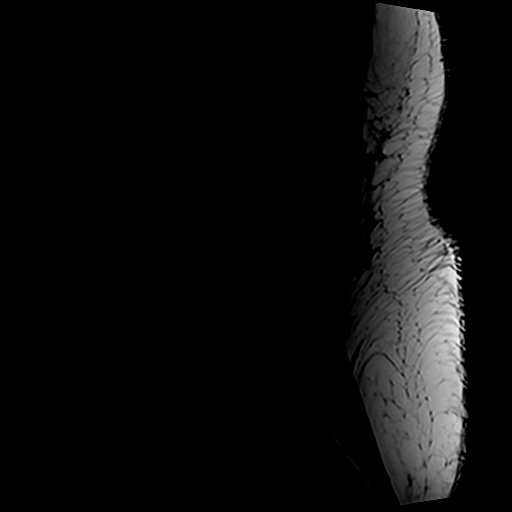
[im 10/13]
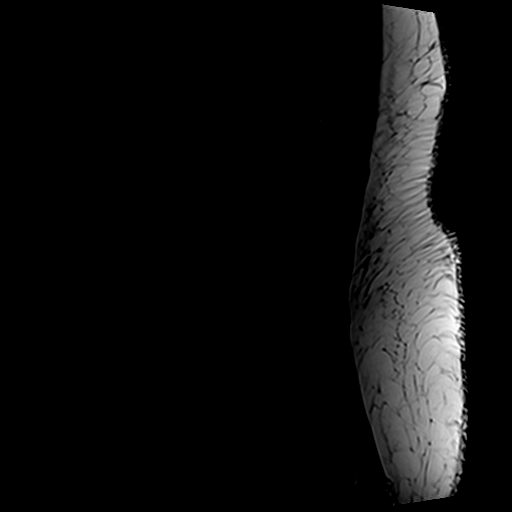
[im 13/13]
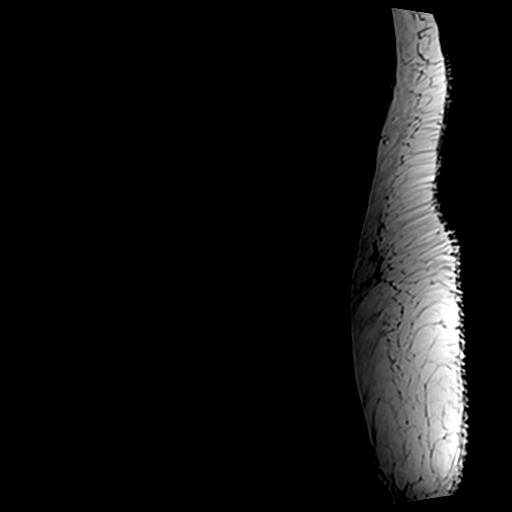

[Series 6: T2 · axial · 4.0mm · 0.70mm/px · z∈[-95,+92]mm · 9 of 35 slices shown (2 of 2)]
[im 1/35]
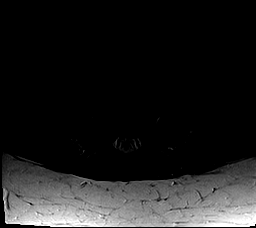
[im 5/35]
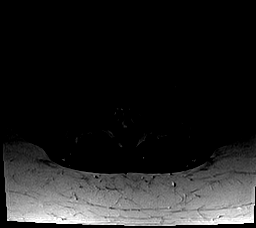
[im 10/35]
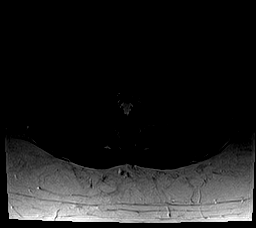
[im 15/35]
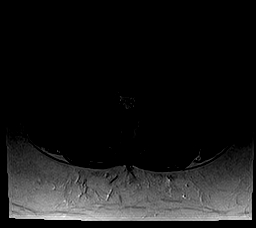
[im 18/35]
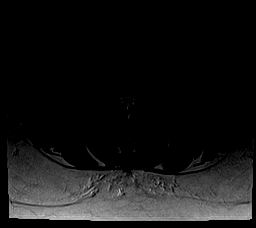
[im 20/35]
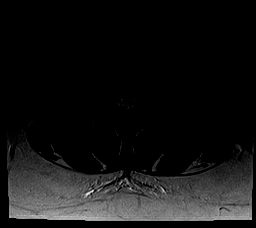
[im 25/35]
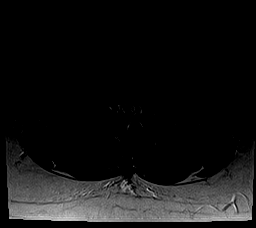
[im 30/35]
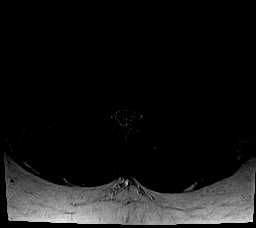
[im 35/35]
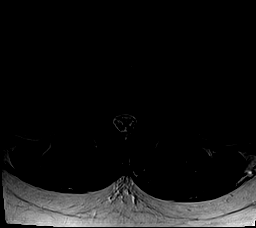

[Series 7: T1 · axial · 4.0mm · 0.35mm/px · z∈[-95,+66]mm · 5 of 35 slices shown (2 of 2)]
[im 1/35]
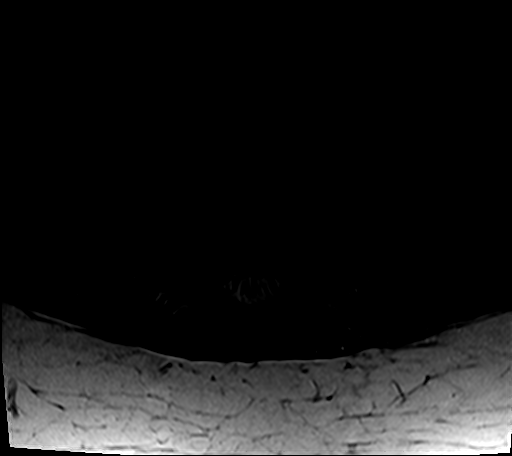
[im 5/35]
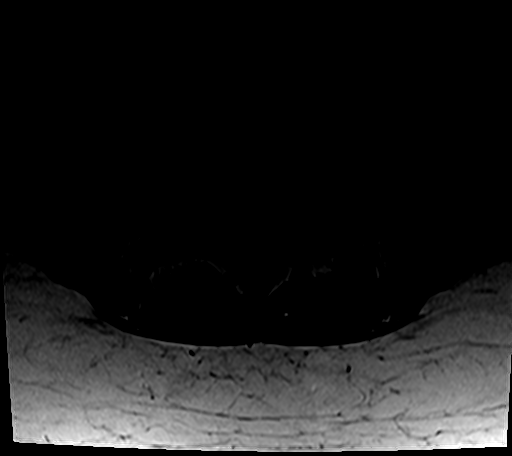
[im 10/35]
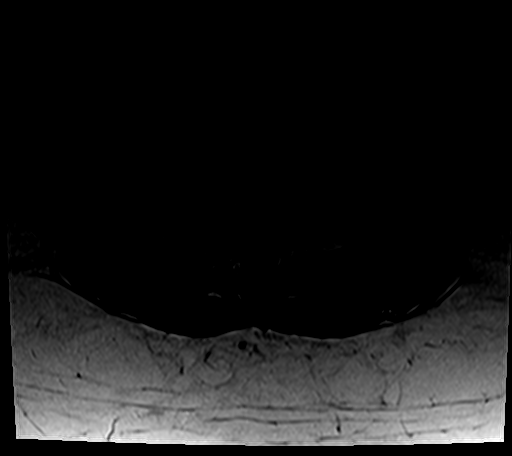
[im 18/35]
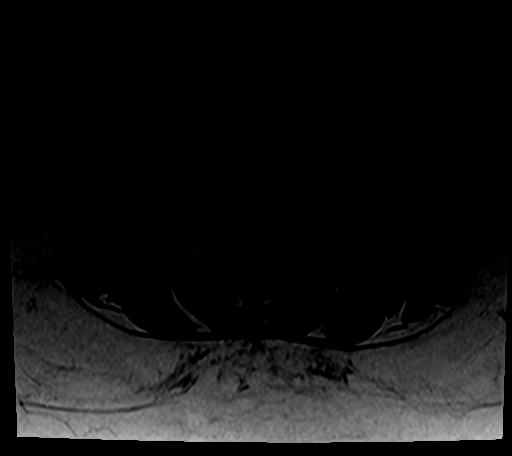
[im 30/35]
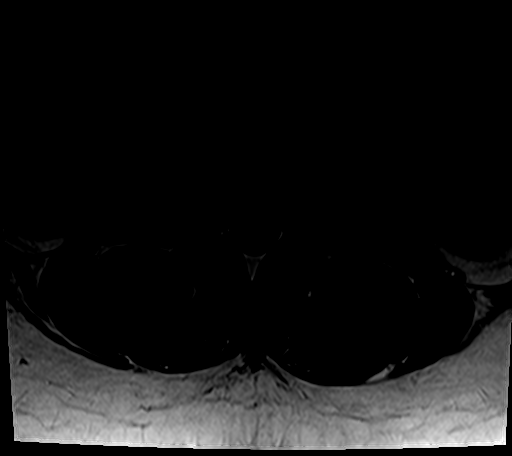

[26 of 48 positions shown; findings below may reference images not displayed]

FINDINGS: SEGMENTATION: For the purposes of this report, the last well-formed
intervertebral disc will be described as L5-S1.

ALIGNMENT: Maintenance of the lumbar lordosis. No malalignment.

VERTEBRAE:Vertebral bodies are intact. Intervertebral discs
demonstrate normal morphology, with slight desiccation L5-S1 disc
consistent with degenerative disc. Mild subacute to chronic
discogenic endplate changes L1-2 and L5-S1. No abnormal or acute
bone marrow signal.

CONUS MEDULLARIS: Conus medullaris terminates at T12-L1 and
demonstrates normal morphology and signal characteristics. Cauda
equina is normal.

PARASPINAL AND SOFT TISSUES: Included prevertebral and paraspinal
soft tissues are normal.

DISC LEVELS:

L1-2: Annular bulging without canal stenosis or neural foraminal
narrowing.

L2-3, L3-4: No disc bulge, canal stenosis nor neural foraminal
narrowing.

L4-5: No disc bulge. Mild facet arthropathy and ligamentum flavum
redundancy and. 3 mm LEFT facet subchondral cyst. No canal stenosis
or neural foraminal narrowing.

L5-S1: Central annular fissure. Mild facet arthropathy without canal
stenosis or neural foraminal narrowing.
IMPRESSION: Early degenerative change of lumbar spine.  L5-S1 annular fissure.

No canal stenosis or neural foraminal narrowing.

## 2019-01-04 ENCOUNTER — Encounter: Payer: Self-pay | Admitting: Orthopaedic Surgery

## 2019-01-04 ENCOUNTER — Other Ambulatory Visit: Payer: Self-pay

## 2019-01-04 ENCOUNTER — Ambulatory Visit (INDEPENDENT_AMBULATORY_CARE_PROVIDER_SITE_OTHER): Payer: Managed Care, Other (non HMO) | Admitting: Orthopaedic Surgery

## 2019-01-04 DIAGNOSIS — Z96641 Presence of right artificial hip joint: Secondary | ICD-10-CM | POA: Diagnosis not present

## 2019-01-04 DIAGNOSIS — M25551 Pain in right hip: Secondary | ICD-10-CM

## 2019-01-04 NOTE — Progress Notes (Signed)
The patient comes today for continued follow-up of a painful right total hip arthroplasty.  Since her last visit she did have a steroid injection and aspiration of the bursa around her right hip.  She says is actually feeling better than it was and that injection was very helpful.  She has been having some left hip and low back pain.  She wonders if this is overcompensation.  Overall she looks like she is doing better.  I can put both hips to internal and external rotation with no significant discomfort at all.  Her low back does hurt and she has been through therapy before with extension exercises.  She has been to try home program because she is out of her benefits in terms of physical therapy as an outpatient and it is expensive for her to go to those types of sessions.  A MRI of her lumbar spine in June of this year did show an L5-S1 annular rent tear with the protrusion but no neural impingement.  At this point I would like her to work on just activity modification as well as weight loss and rest.  I would like to see her back in 6 weeks.  At that visit I would like a low AP pelvis and a lateral of the right hip.  I would like this done supine so her soft tissue will hopefully not interfere with the visualization of the hip components.

## 2019-01-19 ENCOUNTER — Encounter: Payer: Self-pay | Admitting: Physical Medicine and Rehabilitation

## 2019-01-21 ENCOUNTER — Encounter: Payer: Self-pay | Admitting: Orthopaedic Surgery

## 2019-01-21 ENCOUNTER — Other Ambulatory Visit: Payer: Self-pay | Admitting: Orthopaedic Surgery

## 2019-01-22 NOTE — Telephone Encounter (Signed)
Please advise 

## 2019-01-30 ENCOUNTER — Other Ambulatory Visit: Payer: Self-pay

## 2019-01-30 DIAGNOSIS — Z20822 Contact with and (suspected) exposure to covid-19: Secondary | ICD-10-CM

## 2019-02-01 LAB — NOVEL CORONAVIRUS, NAA: SARS-CoV-2, NAA: NOT DETECTED

## 2019-02-15 ENCOUNTER — Ambulatory Visit (INDEPENDENT_AMBULATORY_CARE_PROVIDER_SITE_OTHER): Payer: Managed Care, Other (non HMO)

## 2019-02-15 ENCOUNTER — Encounter: Payer: Self-pay | Admitting: Orthopaedic Surgery

## 2019-02-15 ENCOUNTER — Other Ambulatory Visit: Payer: Self-pay

## 2019-02-15 ENCOUNTER — Ambulatory Visit: Payer: Managed Care, Other (non HMO) | Admitting: Orthopaedic Surgery

## 2019-02-15 DIAGNOSIS — M25551 Pain in right hip: Secondary | ICD-10-CM | POA: Diagnosis not present

## 2019-02-15 DIAGNOSIS — Z96641 Presence of right artificial hip joint: Secondary | ICD-10-CM

## 2019-02-15 MED ORDER — LIDOCAINE HCL 1 % IJ SOLN
3.0000 mL | INTRAMUSCULAR | Status: AC | PRN
  Administered 2019-02-15: 3 mL

## 2019-02-15 MED ORDER — METHYLPREDNISOLONE ACETATE 40 MG/ML IJ SUSP
40.0000 mg | INTRAMUSCULAR | Status: AC | PRN
  Administered 2019-02-15: 40 mg via INTRA_ARTICULAR

## 2019-02-15 NOTE — Progress Notes (Signed)
Office Visit Note   [name]           Date of Birth: Jul 26, 1970           MRN: [id number] Visit Date: 02/15/2019              Requested by: Carol Ada, [address] [place],  [place] [id number] PCP: Carol Ada, MD   Assessment & Plan: Visit Diagnoses:  1. Pain in right hip   2. History of right hip replacement     Plan: Given the severity of her trochanteric pain to palpation I did recommend a steroid injection in this area and she tolerated it well.  I would like to see her back in 4 weeks but no x-rays are needed.  At this point she will be 2 years out from surgery and we will likely end up recommending a revision arthroplasty given her continued pain.  All question concerns were answered and addressed.  Follow-Up Instructions: Return in about 4 weeks (around 03/15/2019).   Orders:  Orders Placed This Encounter  Procedures  . Large Joint Inj  . XR HIP UNILAT W OR W/O PELVIS 1V RIGHT   No orders of the defined types were placed in this encounter.     Procedures: Large Joint Inj: R greater trochanter on 02/15/2019 1:26 PM Indications: pain and diagnostic evaluation Details: 22 G 1.5 in needle, lateral approach  Arthrogram: No  Medications: 3 mL lidocaine 1 %; 40 mg methylPREDNISolone acetate 40 MG/ML Outcome: tolerated well, no immediate complications Procedure, treatment alternatives, risks and benefits explained, specific risks discussed. Consent was given by the patient. Immediately prior to procedure a time out was called to verify the correct patient, procedure, equipment, support staff and site/side marked as required. Patient was prepped and draped in the usual sterile fashion.       Clinical Data: No additional findings.   Subjective: Chief Complaint  Patient presents with  . Right Hip - Follow-up  Emily Hester is almost 2 years out from a right total hip arthroplasty.  She is still having significant pain  all around her proximal femur and lateral thigh area and some in the groin.  There is a little bit of a component of low back pain and sciatica as well.  She has had numerous injections in time and things are just not getting better for her.  Occasionally she still has applied a cane to walk.  She is not a diabetic and not a smoker.  We have had studies on her hip to rule out prosthetic loosening and these have shown a fluid collection and some bone resorption around the proximal femur.  HPI  Review of Systems She currently denies any headache, chest pain, shortness of breath, fever, chills, nausea, vomiting  Objective: Vital Signs: There were no vitals taken for this visit.  Physical Exam She is alert and orient x3 and in no acute distress Ortho Exam Sitting up and lying down her right hip moves fluidly and smoothly with no pain in the groin.  She is very sensitive to any type of palpation along trochanteric area and has a significant amount of guarding when I just press on this area. Specialty Comments:  No specialty comments available.  Imaging: Xr Hip Unilat W Or W/o Pelvis 1v Right  Result Date: 02/15/2019 A low AP pelvis lateral right hip shows a total hip arthroplasty with no complicating features other than some slight resorption around the  shoulder of the femoral component.    PMFS History: Patient Active Problem List   Diagnosis Date Noted  . 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  . Unilateral primary osteoarthritis, right hip 01/27/2017   Past Medical History:  Diagnosis Date  . Arthritis   . Asthma   . Diabetes mellitus without complication (HCC)    type   . Hypertension   . Interstitial cystitis   . IUD (intrauterine device) in place placed 6 weeks ago  . Migraines     Family History  Problem Relation Age of Onset  . Diabetes Mother   . Heart disease Mother   . Diabetes Father   . Eczema Sister   . Asthma Brother   . Asthma Son   . Eczema  Daughter   . Allergic rhinitis Neg Hx   . Angioedema Neg Hx   . Immunodeficiency Neg Hx   . Urticaria Neg Hx     Past Surgical History:  Procedure Laterality Date  . DILATION AND EVACUATION  09/30/2011   Procedure: DILATATION AND EVACUATION;  Surgeon: Mona Andrea, MD;  Location: [facility];  Service: Gynecology;  Laterality: N/A;  . left knee surgery      arthroscopy   . TOTAL HIP ARTHROPLASTY Right 03/11/2017   Procedure: RIGHT TOTAL HIP ARTHROPLASTY ANTERIOR APPROACH;  Surgeon: Kathryne Hitch, MD;  Location: [facility];  Service: Orthopedics;  Laterality: Right;   Social History   Occupational History  . Not on file  Tobacco Use  . Smoking status: Never Smoker  . Smokeless tobacco: Never Used  Substance and Sexual Activity  . Alcohol use: No  . Drug use: No  . Sexual activity: Not on file

## 2019-03-15 ENCOUNTER — Ambulatory Visit: Payer: Managed Care, Other (non HMO) | Admitting: Orthopaedic Surgery

## 2019-03-21 ENCOUNTER — Ambulatory Visit: Payer: Managed Care, Other (non HMO) | Attending: Internal Medicine

## 2019-03-21 DIAGNOSIS — Z20822 Contact with and (suspected) exposure to covid-19: Secondary | ICD-10-CM

## 2019-03-22 LAB — NOVEL CORONAVIRUS, NAA: SARS-CoV-2, NAA: NOT DETECTED

## 2019-03-29 ENCOUNTER — Encounter: Payer: Self-pay | Admitting: Allergy

## 2019-03-29 ENCOUNTER — Ambulatory Visit: Payer: Managed Care, Other (non HMO) | Admitting: Orthopaedic Surgery

## 2019-03-29 ENCOUNTER — Other Ambulatory Visit: Payer: Self-pay

## 2019-03-29 ENCOUNTER — Ambulatory Visit: Payer: PRIVATE HEALTH INSURANCE | Admitting: Allergy

## 2019-03-29 ENCOUNTER — Other Ambulatory Visit: Payer: Self-pay | Admitting: Orthopaedic Surgery

## 2019-03-29 DIAGNOSIS — J4520 Mild intermittent asthma, uncomplicated: Secondary | ICD-10-CM

## 2019-03-29 DIAGNOSIS — J3089 Other allergic rhinitis: Secondary | ICD-10-CM | POA: Diagnosis not present

## 2019-03-29 MED ORDER — IPRATROPIUM BROMIDE 0.06 % NA SOLN: 2.0000 | Freq: Three times a day (TID) | NASAL | 5 refills | Status: DC

## 2019-03-29 MED ORDER — FLUTICASONE-SALMETEROL 250-50 MCG/DOSE IN AEPB: 1.0000 | INHALATION_SPRAY | Freq: Two times a day (BID) | RESPIRATORY_TRACT | 5 refills | Status: DC

## 2019-03-29 MED ORDER — ALBUTEROL SULFATE HFA 108 (90 BASE) MCG/ACT IN AERS: 2.0000 | INHALATION_SPRAY | RESPIRATORY_TRACT | 1 refills | Status: DC | PRN

## 2019-03-29 MED ORDER — MONTELUKAST SODIUM 10 MG PO TABS: 10.0000 mg | ORAL_TABLET | Freq: Every day | ORAL | 5 refills | Status: DC

## 2019-03-29 MED ORDER — OLOPATADINE HCL 0.1 % OP SOLN: 1.0000 [drp] | Freq: Every day | OPHTHALMIC | 5 refills | Status: DC | PRN

## 2019-03-29 NOTE — Patient Instructions (Addendum)
1. Asthma - not well-controlled at this time off maintenance medications - resume Wixela 250/50 1 puff twice a day.  - resume Singulair (Montelukast) 10mg  nightly - have access to albuterol inhaler 2 puffs every 4-6 hours as needed for cough/wheeze/shortness of breath/chest tightness.  May use 15-20 minutes prior to activity.   Monitor frequency of use.    Asthma control goals:   Full participation in all desired activities (may need albuterol before activity)  Albuterol use two time or less a week on average (not counting use with activity)  Cough interfering with sleep two time or less a month  Oral steroids no more than once a year  No hospitalizations   2. Allergic Rhinitis - continue allergen avoidance measures - resume Zyrtec (Cetirizine) 10 mg daily  - resume Singulair (Montelukast) nightly - for nasal drainage/post-nasal drip use nasal Atrovent 0.06% 2 sprays each nostril up to 3-4 times a day as needed - use over-the-counter Pazeo or Pataday 1 drop each eye daily as needed for itchy/watery/red eyes  - Follow up within 3-4 months or sooner if needed

## 2019-03-29 NOTE — Telephone Encounter (Signed)
Please advise 

## 2019-03-29 NOTE — Progress Notes (Signed)
Follow-up Note  RE: Emily Hester MRN: [id number] DOB: 1970-08-08 Date of Office Visit: 03/29/2019   History of present illness: Emily Hester is a 49 y.o. female presenting today for follow-up of asthma and allergic rhinitis.  She was last seen in the office on December 08, 2017 by myself.  She states since this time she has ran out of her medications.  Because of this she is using her albuterol about 1-2 times a day for symptoms of cough shortness of breath, wheeze.  She states she has been doing albuterol daily for the past couple of weeks.  She used to use Wixela until she ran out.  She however denies needing any systemic steroids since her last visit for her asthma.  She has had several hip injections of steroids to help manage her bursitis.  She also used to take Singulair but she has been out of this as well.  In regards to her allergies she is having a lot of throat clearing and hoarseness.  She is out of his cetirizine and states when she was taking cetirizine and Singulair that she did have better control of her allergy symptoms.  She does not feel that the Dymista helped much in controlling her nasal drainage.  Review of systems: Review of Systems  Constitutional: Negative.   HENT: Positive for congestion.   Eyes: Negative.   Respiratory: Positive for cough, shortness of breath and wheezing.   Cardiovascular: Negative.   Gastrointestinal: Negative.   Musculoskeletal: Negative.   Skin: Negative.   Neurological: Negative.     All other systems negative unless noted above in HPI  Past medical/social/surgical/family history have been reviewed and are unchanged unless specifically indicated below.  She has a new 45-month-old grandson  Medication List: Current Outpatient Medications  Medication Sig Dispense Refill  . albuterol (PROVENTIL HFA;VENTOLIN HFA) 108 (90 Base) MCG/ACT inhaler Inhale 2 puffs into the lungs every 4 (four) hours as needed for wheezing or  shortness of breath. 1 Inhaler 1  . amLODipine (NORVASC) 10 MG tablet Take 10 mg by mouth every morning.    . cetirizine (ZYRTEC) 10 MG tablet Take 1 tablet (10 mg total) by mouth daily. 30 tablet 5  . JANUVIA 100 MG tablet Take 100 mg by mouth daily.    Marland Kitchen levonorgestrel (MIRENA) 20 MCG/24HR IUD 1 each by Intrauterine route once.    Marland Kitchen losartan (COZAAR) 25 MG tablet Take 25 mg by mouth daily.  0  . metFORMIN (GLUCOPHAGE-XR) 750 MG 24 hr tablet     . omeprazole (PRILOSEC) 20 MG capsule Take 1 capsule (20 mg total) by mouth every morning. (Patient taking differently: Take 20 mg by mouth 2 (two) times daily. ) 30 capsule 5  . ONETOUCH VERIO test strip as directed.   3  . rosuvastatin (CRESTOR) 10 MG tablet Take 10 mg by mouth daily.  3  . STEGLATRO 5 MG TABS     . tiZANidine (ZANAFLEX) 4 MG capsule Take 4 mg by mouth 3 (three) times daily.    . traMADol (ULTRAM) 50 MG tablet TAKE 1-2 TABLETS (50-100 MG TOTAL) BY MOUTH EVERY 6 (SIX) HOURS AS NEEDED. 60 tablet 0  . Fluticasone-Salmeterol (WIXELA INHUB) 250-50 MCG/DOSE AEPB Inhale 1 puff into the lungs 2 (two) times daily. (Patient not taking: Reported on 03/29/2019) 60 each 5  . montelukast (SINGULAIR) 10 MG tablet Take 1 tablet (10 mg total) by mouth at bedtime. (Patient not taking: Reported on 03/29/2019) 30 tablet 5  No current facility-administered medications for this visit.     Known medication allergies: Allergies  Allergen Reactions  . Amoxicillin Hives, Swelling and Other (See Comments)    Has patient had a PCN reaction causing immediate rash, facial/tongue/throat swelling, SOB or lightheadedness with hypotension: Yes Has patient had a PCN reaction causing severe rash involving mucus membranes or skin necrosis: No Has patient had a PCN reaction that required hospitalization: Yes Has patient had a PCN reaction occurring within the last 10 years: No  If all of the above answers are "NO", then may proceed with Cephalosporin use.   .  Sulfamethoxazole-Trimethoprim Rash  . Lisinopril Itching and Cough  . Bactrim Rash     Physical examination: Blood pressure (!) 142/86, pulse 96, temperature 97.9 F (36.6 C), temperature source Temporal, resp. rate 18, height 5\' 5"  (1.651 m), weight 283 lb 12.8 oz (128.7 kg), SpO2 98 %.  General: Alert, interactive, in no acute distress. HEENT: PERRLA,TMs pearly gray, turbinates mildly edematous without discharge, post-pharynx non erythematous. Neck: Supple without lymphadenopathy. Lungs: Mildly decreased breath sounds bilaterally without wheezing, rhonchi or rales. {no increased work of breathing. CV: Normal S1, S2 without murmurs. Abdomen: Nondistended, nontender. Skin: Warm and dry, without lesions or rashes. Extremities:  No clubbing, cyanosis or edema. Neuro:   Grossly intact.  Diagnositics/Labs: Spirometry: FEV1: 1.81L 75%, FVC: 2.36L 78% predicted.  FEV1 and FVC are reduced for age/demographic  Assessment and plan:   1. Asthma, mild intermittent - not well-controlled at this time off maintenance medications - resume Wixela 250/50 1 puff twice a day.  - resume Singulair (Montelukast) 10mg  nightly - have access to albuterol inhaler 2 puffs every 4-6 hours as needed for cough/wheeze/shortness of breath/chest tightness.  May use 15-20 minutes prior to activity.   Monitor frequency of use.    Asthma control goals:   Full participation in all desired activities (may need albuterol before activity)  Albuterol use two time or less a week on average (not counting use with activity)  Cough interfering with sleep two time or less a month  Oral steroids no more than once a year  No hospitalizations   2. Allergic Rhinitis - continue allergen avoidance measures - resume Zyrtec (Cetirizine) 10 mg daily  - resume Singulair (Montelukast) nightly - for nasal drainage/post-nasal drip use nasal Atrovent 0.06% 2 sprays each nostril up to 3-4 times a day as needed - use  over-the-counter Pazeo or Pataday 1 drop each eye daily as needed for itchy/watery/red eyes  - Follow up within 3-4 months or sooner if needed  I appreciate the opportunity to take part in Emily Hester's care. Please do not hesitate to contact me with questions.  Sincerely,   , MD Allergy/Immunology [facility]

## 2019-04-12 ENCOUNTER — Ambulatory Visit: Payer: Managed Care, Other (non HMO) | Admitting: Orthopaedic Surgery

## 2019-04-17 ENCOUNTER — Ambulatory Visit: Payer: Managed Care, Other (non HMO) | Admitting: Orthopaedic Surgery

## 2019-04-17 ENCOUNTER — Other Ambulatory Visit: Payer: Self-pay

## 2019-04-17 ENCOUNTER — Encounter: Payer: Self-pay | Admitting: Orthopaedic Surgery

## 2019-04-17 DIAGNOSIS — M5431 Sciatica, right side: Secondary | ICD-10-CM | POA: Diagnosis not present

## 2019-04-17 DIAGNOSIS — M7061 Trochanteric bursitis, right hip: Secondary | ICD-10-CM | POA: Diagnosis not present

## 2019-04-17 DIAGNOSIS — Z96641 Presence of right artificial hip joint: Secondary | ICD-10-CM | POA: Diagnosis not present

## 2019-04-17 NOTE — Progress Notes (Signed)
Office Visit Note   [name]           Date of Birth: 1970-10-16           MRN: [id number] Visit Date: 04/17/2019              Requested by: Carol Ada, [place] [place],  [place] [id number] PCP: Carol Ada, MD   Assessment & Plan: Visit Diagnoses:  1. Sciatica, right side   2. Trochanteric bursitis, right hip   3. Status post right hip replacement     Plan: We will have her do IT band stretching exercises as discussed.  Send her for repeat epidural steroid injection with Dr. Ernestina Patches.  Follow-up Dr. Ninfa Linden 2 weeks after the injection to see what type of response she had.    Follow-Up Instructions: Return 2 weeks after epidural steroid injection.   Orders:  No orders of the defined types were placed in this encounter.  No orders of the defined types were placed in this encounter.     Procedures: No procedures performed   Clinical Data: No additional findings.   Subjective: Chief Complaint  Patient presents with  . Right Hip - Follow-up    HPI Emily Hester returns today due to right hip pain and low back pain.Again patient is 2 years out from a right total hip arthroplasty.  She did have a CT scan of her right hip which showed slight bone reabsorption around the proximal stem of the femoral component. She states she is mainly having back pain and lateral hip pain at this point.  States that her pain in her back radiates down the right hip.  She is using a cane at times to walk.  She is having no numbness tingling down the right leg.  Her pain in the back is similar to what she had back this past summer for which she underwent a right S1 foraminal injection by Dr. Ernestina Patches and states she got good relief with the injection.  Review of Systems Negative for fevers chills shortness of breath  Objective: Vital Signs: There were no vitals taken for this visit.  Physical Exam Constitutional:      Appearance: She is not  ill-appearing or diaphoretic.  Pulmonary:     Effort: Pulmonary effort is normal.  Neurological:     Mental Status: She is alert and oriented to person, place, and time.  Psychiatric:        Mood and Affect: Mood normal.        Behavior: Behavior normal.     Ortho Exam Bilateral hips good range of motion without pain.  Tenderness over the right trochanteric region.  5 out of 5 strength throughout lower extremities.  Positive straight leg raise on the right. Specialty Comments:  No specialty comments available.  Imaging: No results found.   PMFS History: Patient Active Problem List   Diagnosis Date Noted  . 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  . Unilateral primary osteoarthritis, right hip 01/27/2017   Past Medical History:  Diagnosis Date  . Arthritis   . Asthma   . Diabetes mellitus without complication ([place])    type   . Hypertension   . Interstitial cystitis   . IUD (intrauterine device) in place placed 6 weeks ago  . Migraines     Family History  Problem Relation Age of Onset  . Diabetes Mother   . Heart disease Mother   . Diabetes  Father   . Eczema Sister   . Asthma Brother   . Asthma Son   . Eczema Daughter   . Allergic rhinitis Neg Hx   . Angioedema Neg Hx   . Immunodeficiency Neg Hx   . Urticaria Neg Hx     Past Surgical History:  Procedure Laterality Date  . DILATION AND EVACUATION  09/30/2011   Procedure: DILATATION AND EVACUATION;  Surgeon: Nica Andrea, MD;  Location: [facility];  Service: Gynecology;  Laterality: N/A;  . left knee surgery      arthroscopy   . TOTAL HIP ARTHROPLASTY Right 03/11/2017   Procedure: RIGHT TOTAL HIP ARTHROPLASTY ANTERIOR APPROACH;  Surgeon: Kathryne Hitch, MD;  Location: [facility];  Service: Orthopedics;  Laterality: Right;   Social History   Occupational History  . Not on file  Tobacco Use  . Smoking status: Never Smoker  . Smokeless tobacco: Never Used  Substance and Sexual Activity    . Alcohol use: No  . Drug use: No  . Sexual activity: Not on file

## 2019-04-17 NOTE — Addendum Note (Signed)
Addended by: Mardene Celeste B on: 04/17/2019 10:42 AM   Modules accepted: Orders

## 2019-04-30 ENCOUNTER — Ambulatory Visit: Payer: Self-pay

## 2019-04-30 ENCOUNTER — Other Ambulatory Visit: Payer: Self-pay

## 2019-04-30 ENCOUNTER — Encounter: Payer: Self-pay | Admitting: Physical Medicine and Rehabilitation

## 2019-04-30 ENCOUNTER — Ambulatory Visit: Payer: Managed Care, Other (non HMO) | Admitting: Physical Medicine and Rehabilitation

## 2019-04-30 VITALS — BP 133/90 | HR 91

## 2019-04-30 DIAGNOSIS — M5416 Radiculopathy, lumbar region: Secondary | ICD-10-CM

## 2019-04-30 MED ORDER — METHYLPREDNISOLONE ACETATE 80 MG/ML IJ SUSP
40.0000 mg | Freq: Once | INTRAMUSCULAR | Status: AC
  Administered 2019-04-30: 09:00:00 40 mg

## 2019-04-30 NOTE — Procedures (Signed)
S1 Lumbosacral Transforaminal Epidural Steroid Injection - Sub-Pedicular Approach with Fluoroscopic Guidance   [name]      Date of Birth: 1970/06/26 MRN: [id number] PCP: Merri Brunette, MD      Visit Date: 04/30/2019   Universal Protocol:    Date/Time: 02/15/211:10 PM  Consent Given By: the patient  Position:  PRONE  Additional Comments: Vital signs were monitored before and after the procedure. Patient was prepped and draped in the usual sterile fashion. The correct patient, procedure, and site was verified.   Injection Procedure Details:  Procedure Site One Meds Administered:  Meds ordered this encounter  Medications  . methylPREDNISolone acetate (DEPO-MEDROL) injection 40 mg    Laterality: Right  Location/Site:  S1 Foramen   Needle size: 22 ga.  Needle type: Spinal  Needle Placement: Transforaminal  Findings:   -Comments: Excellent flow of contrast along the nerve and into the epidural space.  Epidurogram: Contrast epidurogram showed no nerve root cut off or restricted flow pattern.  Procedure Details: After squaring off the sacral end-plate to get a true AP view, the C-arm was positioned so that the best possible view of the S1 foramen was visualized. The soft tissues overlying this structure were infiltrated with 2-3 ml. of 1% Lidocaine without Epinephrine.    The spinal needle was inserted toward the target using a "trajectory" view along the fluoroscope beam.  Under AP and lateral visualization, the needle was advanced so it did not puncture dura. Biplanar projections were used to confirm position. Aspiration was confirmed to be negative for CSF and/or blood. A 1-2 ml. volume of Isovue-250 was injected and flow of contrast was noted at each level. Radiographs were obtained for documentation purposes.   After attaining the desired flow of contrast documented above, a 0.5 to 1.0 ml test dose of 0.25% Marcaine was injected into each  respective transforaminal space.  The patient was observed for 90 seconds post injection.  After no sensory deficits were reported, and normal lower extremity motor function was noted,   the above injectate was administered so that equal amounts of the injectate were placed at each foramen (level) into the transforaminal epidural space.   Additional Comments:  The patient tolerated the procedure well Dressing: Band-Aid with 2 x 2 sterile gauze    Post-procedure details: Patient was observed during the procedure. Post-procedure instructions were reviewed.  Patient left the clinic in stable condition.

## 2019-04-30 NOTE — Progress Notes (Signed)
Numeric Pain Rating Scale and Functional Assessment Average Pain 10   In the last MONTH (on 0-10 scale) has pain interfered with the following?  1. General activity like being  able to carry out your everyday physical activities such as walking, climbing stairs, carrying groceries, or moving a chair?  Rating(10)   +Driver, -BT, -Dye Allergies. 

## 2019-04-30 NOTE — Progress Notes (Signed)
Emily Hester - 49 y.o. female MRN [id number]  Date of birth: 05/10/1970  Office Visit Note: Visit Date: 04/30/2019 PCP: Merri Brunette, MD Referred by: Merri Brunette, MD  Subjective: Chief Complaint  Patient presents with  . Lower Back - Pain   HPI: Emily Hester is a 49 y.o. female who comes in today For repeat right S1 transforaminal epidural steroid injection.  Prior injection did help her.  She is also had prior total hip arthroplasty.  She continues to be followed by Dr. [name].  She reports mostly axial low back pain with occasional pain in the leg.  MRI findings are facet arthropathy at L3-4 and L4-5 without high-grade stenosis.  There is annular tear at L5-S1 on the right.  At this point we will repeat the injection.  Depending on how she does could look at diagnostic medial branch blocks of the facet joints to see if that is causing a lot of her back pain.  She continues to get some referral pattern more of a hip type pain even though she has the hip replacement she will continue to follow-up with Dr. Magnus Ivan.  She reports 10 out of 10 pain.  She does not have any diagnosis of fibromyalgia.  Her case is complicated by morbid obesity and diabetes.  ROS Otherwise per HPI.  Assessment & Plan: Visit Diagnoses:  1. Lumbar radiculopathy     Plan: No additional findings.   Meds & Orders:  Meds ordered this encounter  Medications  . methylPREDNISolone acetate (DEPO-MEDROL) injection 40 mg    Orders Placed This Encounter  Procedures  . XR C-ARM NO REPORT  . Epidural Steroid injection    Follow-up: Return if symptoms worsen or fail to improve.   Procedures: No procedures performed  S1 Lumbosacral Transforaminal Epidural Steroid Injection - Sub-Pedicular Approach with Fluoroscopic Guidance   [name]      Date of Birth: 1970/04/10 MRN: [id number] PCP: Merri Brunette, MD      Visit Date: 04/30/2019   Universal Protocol:      Date/Time: 02/15/211:10 PM  Consent Given By: the patient  Position:  PRONE  Additional Comments: Vital signs were monitored before and after the procedure. Patient was prepped and draped in the usual sterile fashion. The correct patient, procedure, and site was verified.   Injection Procedure Details:  Procedure Site One Meds Administered:  Meds ordered this encounter  Medications  . methylPREDNISolone acetate (DEPO-MEDROL) injection 40 mg    Laterality: Right  Location/Site:  S1 Foramen   Needle size: 22 ga.  Needle type: Spinal  Needle Placement: Transforaminal  Findings:   -Comments: Excellent flow of contrast along the nerve and into the epidural space.  Epidurogram: Contrast epidurogram showed no nerve root cut off or restricted flow pattern.  Procedure Details: After squaring off the sacral end-plate to get a true AP view, the C-arm was positioned so that the best possible view of the S1 foramen was visualized. The soft tissues overlying this structure were infiltrated with 2-3 ml. of 1% Lidocaine without Epinephrine.    The spinal needle was inserted toward the target using a "trajectory" view along the fluoroscope beam.  Under AP and lateral visualization, the needle was advanced so it did not puncture dura. Biplanar projections were used to confirm position. Aspiration was confirmed to be negative for CSF and/or blood. A 1-2 ml. volume of Isovue-250 was injected and flow of contrast was noted at each level. Radiographs were obtained for  documentation purposes.   After attaining the desired flow of contrast documented above, a 0.5 to 1.0 ml test dose of 0.25% Marcaine was injected into each respective transforaminal space.  The patient was observed for 90 seconds post injection.  After no sensory deficits were reported, and normal lower extremity motor function was noted,   the above injectate was administered so that equal amounts of the injectate were placed at  each foramen (level) into the transforaminal epidural space.   Additional Comments:  The patient tolerated the procedure well Dressing: Band-Aid with 2 x 2 sterile gauze    Post-procedure details: Patient was observed during the procedure. Post-procedure instructions were reviewed.  Patient left the clinic in stable condition.     Clinical History: MRI LUMBAR SPINE WITHOUT CONTRAST  TECHNIQUE: Multiplanar, multisequence MR imaging of the lumbar spine was performed. No intravenous contrast was administered.  COMPARISON:  07/28/2016.  FINDINGS: Segmentation:  Standard.  Alignment:  Physiologic.  Vertebrae: Mild endplate changes are stable. No fracture, evidence of diskitis, or bone lesion.  Conus medullaris and cauda equina: Conus extends to the T12-L1 level. Conus and cauda equina appear normal.  Paraspinal and other soft tissues: Is increased body habitus without features of epidural lipomatosis.  Disc levels:  L1-L2: Loss of disc height with preserved disc signal. Small foraminal protrusion on the LEFT. No definite compressive subarticular zone or foraminal zone narrowing.  L2-L3:  Unremarkable.  L3-L4: Unremarkable disc space. Facet arthropathy. No impingement.  L4-L5: Unremarkable disc space. Facet arthropathy. No impingement.  L5-S1: RIGHT L5-S1 annular rent. Disc desiccation. Mild facet arthropathy. No stenosis, or neural impingement. Specifically, no RIGHT-sided compressive lesion  Compared with priors, similar appearance.  IMPRESSION: RIGHT L5-S1 annular rent, is not associated with a disc protrusion nor neural impingement. The appearance is fairly similar to 2018. This abnormality could serve as a source of RIGHT-sided radicular pain.  Consider lumbar epidural RIGHT S1 nerve root block with transforaminal steroid injection as a diagnostic and potentially therapeutic maneuver.  Small foraminal protrusion L1-2 on the LEFT,  doubt significance.  Lower lumbar facet arthropathy without significant stenosis or RIGHT-sided impingement.   Electronically Signed   By: Elsie Stain M.D.   On: 08/24/2018 16:03   She reports that she has never smoked. She has never used smokeless tobacco. No results for input(s): HGBA1C, LABURIC in the last 8760 hours.  Objective:  VS:  HT:    WT:   BMI:     BP:133/90  HR:91bpm  TEMP: ( )  RESP:  Physical Exam  Ortho Exam Imaging: XR C-ARM NO REPORT  Result Date: 04/30/2019 Please see Notes tab for imaging impression.   Past Medical/Family/Surgical/Social History: Medications & Allergies reviewed per EMR, new medications updated. Patient Active Problem List   Diagnosis Date Noted  . 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  . Unilateral primary osteoarthritis, right hip 01/27/2017   Past Medical History:  Diagnosis Date  . Arthritis   . Asthma   . Diabetes mellitus without complication ([facility])    type   . Hypertension   . Interstitial cystitis   . IUD (intrauterine device) in place placed 6 weeks ago  . Migraines    Family History  Problem Relation Age of Onset  . Diabetes Mother   . Heart disease Mother   . Diabetes Father   . Eczema Sister   . Asthma Brother   . Asthma Son   . Eczema Daughter   . Allergic rhinitis Neg Hx   .  Angioedema Neg Hx   . Immunodeficiency Neg Hx   . Urticaria Neg Hx    Past Surgical History:  Procedure Laterality Date  . DILATION AND EVACUATION  09/30/2011   Procedure: DILATATION AND EVACUATION;  Surgeon: Allena Katz, MD;  Location: [facility];  Service: Gynecology;  Laterality: N/A;  . left knee surgery      arthroscopy   . TOTAL HIP ARTHROPLASTY Right 03/11/2017   Procedure: RIGHT TOTAL HIP ARTHROPLASTY ANTERIOR APPROACH;  Surgeon: Mcarthur Rossetti, MD;  Location: [facility];  Service: Orthopedics;  Laterality: Right;   Social History   Occupational History  . Not on file  Tobacco Use  .  Smoking status: Never Smoker  . Smokeless tobacco: Never Used  Substance and Sexual Activity  . Alcohol use: No  . Drug use: No  . Sexual activity: Not on file

## 2019-05-14 ENCOUNTER — Encounter: Payer: Self-pay | Admitting: Orthopaedic Surgery

## 2019-05-14 ENCOUNTER — Ambulatory Visit: Payer: Managed Care, Other (non HMO) | Admitting: Orthopaedic Surgery

## 2019-05-14 ENCOUNTER — Other Ambulatory Visit: Payer: Self-pay

## 2019-05-14 DIAGNOSIS — M7061 Trochanteric bursitis, right hip: Secondary | ICD-10-CM | POA: Diagnosis not present

## 2019-05-14 DIAGNOSIS — Z96641 Presence of right artificial hip joint: Secondary | ICD-10-CM

## 2019-05-14 DIAGNOSIS — M5431 Sciatica, right side: Secondary | ICD-10-CM | POA: Diagnosis not present

## 2019-05-14 MED ORDER — LIDOCAINE HCL 1 % IJ SOLN
3.0000 mL | INTRAMUSCULAR | Status: AC | PRN
  Administered 2019-05-14: 10:00:00 3 mL

## 2019-05-14 MED ORDER — METHYLPREDNISOLONE ACETATE 40 MG/ML IJ SUSP
40.0000 mg | INTRAMUSCULAR | Status: AC | PRN
  Administered 2019-05-14: 10:00:00 40 mg via INTRA_ARTICULAR

## 2019-05-14 NOTE — Progress Notes (Signed)
Office Visit Note   [name]           Date of Birth: 06/13/1970           MRN: [id number] Visit Date: 05/14/2019              Requested by: Merri Brunette, MD [id number] [place],  [place] [address] PCP: Merri Brunette, MD   Assessment & Plan: Visit Diagnoses:  1. Sciatica, right side   2. Trochanteric bursitis, right hip   3. Status post right hip replacement     Plan: Her signs and symptoms do not seem to be consistent with failure of the hip prosthesis.  I did try a steroid injection of her trochanteric area.  At this point if this fails, I would recommend potentially a surgery that would be mainly just a bursectomy in this area with assessing the IT band and the trochanteric area.  We will see what she looks like in 4 weeks from now having had the injection today.  Follow-Up Instructions: Return in about 4 weeks (around 06/11/2019).   Orders:  Orders Placed This Encounter  Procedures  . Large Joint Inj   No orders of the defined types were placed in this encounter.     Procedures: Large Joint Inj: R greater trochanter on 05/14/2019 10:00 AM Indications: pain and diagnostic evaluation Details: 22 G 1.5 in needle, lateral approach  Arthrogram: No  Medications: 3 mL lidocaine 1 %; 40 mg methylPREDNISolone acetate 40 MG/ML Outcome: tolerated well, no immediate complications Procedure, treatment alternatives, risks and benefits explained, specific risks discussed. Consent was given by the patient. Immediately prior to procedure a time out was called to verify the correct patient, procedure, equipment, support staff and site/side marked as required. Patient was prepped and draped in the usual sterile fashion.       Clinical Data: No additional findings.   Subjective: Chief Complaint  Patient presents with  . Lower Back - Follow-up  Emily Hester is following up after having an open right-sided ESI by Dr. Alvester Morin.  This was done  just 2 weeks ago.  This is greatly helped with her low back pain.  She is still experiencing right hip and thigh pain.  We performed hip replacement now 2 years ago.  Last year we obtain a three-phase bone scan as well as a MRI of the right hip and a CT scan.  None of this showed evidence of prosthetic loosening.  She ambulates with a cane.  Her last weight is 203 pounds.  She says the back pain has been helped significantly with the steroid injection but the hip pain persist.  HPI  Review of Systems She currently denies any headache, chest pain, shortness of breath, fever, chills, nausea, vomiting  Objective: Vital Signs: There were no vitals taken for this visit.  Physical Exam She is alert and oriented x3 and in no acute distress Ortho Exam Examination of her right hip shows fluid internal and external rotation.  There is no pain in the groin at all.  She is very point tender to palpation of the trochanteric area and IT band and is very sensitive over this area. Specialty Comments:  No specialty comments available.  Imaging: No results found.   PMFS History: Patient Active Problem List   Diagnosis Date Noted  . 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  . Unilateral primary osteoarthritis, right hip 01/27/2017   Past Medical History:  Diagnosis  Date  . Arthritis   . Asthma   . Diabetes mellitus without complication ([place])    type   . Hypertension   . Interstitial cystitis   . IUD (intrauterine device) in place placed 6 weeks ago  . Migraines     Family History  Problem Relation Age of Onset  . Diabetes Mother   . Heart disease Mother   . Diabetes Father   . Eczema Sister   . Asthma Brother   . Asthma Son   . Eczema Daughter   . Allergic rhinitis Neg Hx   . Angioedema Neg Hx   . Immunodeficiency Neg Hx   . Urticaria Neg Hx     Past Surgical History:  Procedure Laterality Date  . DILATION AND EVACUATION  09/30/2011   Procedure: DILATATION AND  EVACUATION;  Surgeon: Allena Katz, MD;  Location: [facility];  Service: Gynecology;  Laterality: N/A;  . left knee surgery      arthroscopy   . TOTAL HIP ARTHROPLASTY Right 03/11/2017   Procedure: RIGHT TOTAL HIP ARTHROPLASTY ANTERIOR APPROACH;  Surgeon: Mcarthur Rossetti, MD;  Location: [facility];  Service: Orthopedics;  Laterality: Right;   Social History   Occupational History  . Not on file  Tobacco Use  . Smoking status: Never Smoker  . Smokeless tobacco: Never Used  Substance and Sexual Activity  . Alcohol use: No  . Drug use: No  . Sexual activity: Not on file

## 2019-06-06 ENCOUNTER — Encounter: Payer: Self-pay | Admitting: Orthopaedic Surgery

## 2019-06-18 ENCOUNTER — Other Ambulatory Visit: Payer: Self-pay

## 2019-06-18 ENCOUNTER — Encounter: Payer: Self-pay | Admitting: Orthopaedic Surgery

## 2019-06-18 ENCOUNTER — Ambulatory Visit: Payer: Managed Care, Other (non HMO) | Admitting: Orthopaedic Surgery

## 2019-06-18 ENCOUNTER — Other Ambulatory Visit: Payer: Self-pay | Admitting: Orthopaedic Surgery

## 2019-06-18 DIAGNOSIS — M7061 Trochanteric bursitis, right hip: Secondary | ICD-10-CM

## 2019-06-18 DIAGNOSIS — Z96641 Presence of right artificial hip joint: Secondary | ICD-10-CM

## 2019-06-18 NOTE — Progress Notes (Signed)
The patient is well-known to me.  She is well over 2 years out from a right direct anterior hip replacement surgery.  She is still has issues with chronic right hip pain.  Most of her pain is been centered over the trochanteric area of her right hip.  We have had numerous interventions on this right hip including aspirations and steroid injections.  This is only temporize her symptoms.  We have also had numerous radiographic studies looking for loosening or failure of the prosthesis and these have been negative.  She still having significant right hip pain of the trochanteric area and IT band.  She ambulates with a cane.  She has tried and failed conservative treatment for well over 12 months.  This includes also physical therapy and topical anti-inflammatories as well as numerous steroid injections.  She is also work on activity modification and weight loss.  On exam she is still very tender to palpation over the trochanteric area of her right hip and the IT band.  At this point I am recommending an operative intervention for her right hip given the failed conservative treatment.  This would involve placing her in a lateral position and then dissecting down to the trochanteric area performing a bursectomy and assessing the other structures around her right hip.  I do feel this is worth trying as opposed to a revision arthroplasty given the findings radiographically that we are seeing and also based on what were seeing on her clinical exam.  She is frustrated at this point given a long course of conservative treatment that has failed.  I explained in detail what the surgical intervention involves including a thorough discussion of the anatomy showing her hip model.  I explained in detail the risk and benefits of surgery.  She does wish to proceed with surgery at this standpoint and I agree based on all the a forementioned variables.  We will work on getting this scheduled in the near future.  We would then see  her back at 2 weeks postoperative.  I would have her avoid high impact aerobic activities and not sleep on her side for several months following surgery as well.  All questions and concerns were answered and addressed.

## 2019-06-20 ENCOUNTER — Telehealth: Payer: Self-pay | Admitting: Orthopaedic Surgery

## 2019-06-21 ENCOUNTER — Telehealth: Payer: Self-pay | Admitting: Orthopaedic Surgery

## 2019-06-21 NOTE — Telephone Encounter (Signed)
Patient called   She is worried about the fact that she has yet to get in touch with a surgery scheduler. I informed her that she must be patient   Call back: [phone]

## 2019-06-27 ENCOUNTER — Ambulatory Visit: Payer: PRIVATE HEALTH INSURANCE | Admitting: Allergy

## 2019-07-03 ENCOUNTER — Telehealth: Payer: Self-pay | Admitting: Orthopaedic Surgery

## 2019-07-03 NOTE — Telephone Encounter (Signed)
Patient would like to move her surgery to May 6th and requested a call back from you to get her other insurance information.  CB#[phone].  Thank you.

## 2019-07-04 NOTE — Telephone Encounter (Signed)
I called patient 

## 2019-07-12 ENCOUNTER — Other Ambulatory Visit: Payer: Self-pay | Admitting: Orthopaedic Surgery

## 2019-07-12 NOTE — Telephone Encounter (Signed)
Please advise 

## 2019-07-19 ENCOUNTER — Other Ambulatory Visit: Payer: Self-pay | Admitting: Orthopaedic Surgery

## 2019-07-19 DIAGNOSIS — M7061 Trochanteric bursitis, right hip: Secondary | ICD-10-CM

## 2019-07-19 MED ORDER — OXYCODONE HCL 5 MG PO TABS: 5.0000 mg | ORAL_TABLET | Freq: Four times a day (QID) | ORAL | 0 refills | Status: DC | PRN

## 2019-07-26 ENCOUNTER — Inpatient Hospital Stay: Payer: Managed Care, Other (non HMO) | Admitting: Orthopaedic Surgery

## 2019-08-02 ENCOUNTER — Ambulatory Visit (INDEPENDENT_AMBULATORY_CARE_PROVIDER_SITE_OTHER): Payer: Managed Care, Other (non HMO) | Admitting: Orthopaedic Surgery

## 2019-08-02 ENCOUNTER — Encounter: Payer: Self-pay | Admitting: Orthopaedic Surgery

## 2019-08-02 ENCOUNTER — Other Ambulatory Visit: Payer: Self-pay

## 2019-08-02 DIAGNOSIS — M7061 Trochanteric bursitis, right hip: Secondary | ICD-10-CM

## 2019-08-02 MED ORDER — OXYCODONE HCL 5 MG PO TABS: 5.0000 mg | ORAL_TABLET | Freq: Four times a day (QID) | ORAL | 0 refills | Status: DC | PRN

## 2019-08-02 NOTE — Progress Notes (Signed)
The patient is 2 weeks today status post a bursectomy of her right trochanteric area.  I tried to do a little bit of IT band lengthening in that proximal area.  She has chronic right hip pain and has a history of a total hip arthroplasty with no evidence of loosening or complicating features of the prosthesis.  After a long drawn out course of conservative treatment she elected to proceed with an operative intervention.  We did find significant bursitis over the trochanteric area.  She is having soreness and stiffness from the surgery itself.  On exam her suture line is intact.  Remove the sutures in place Steri-Strips.  She does have a large postoperative seroma.  I was able to aspirate 90 cc of fluid from the soft tissue.  This gave her relief as well.  I would like to see her back in just 1 week for assessment for whether or not the seroma reaccumulate which she understands there is a high likelihood that it will the will need to aspirate again.  She will slowly increase her activities as comfort allows and not lay on that side at least for another month.

## 2019-08-08 ENCOUNTER — Encounter: Payer: Self-pay | Admitting: Orthopaedic Surgery

## 2019-08-08 ENCOUNTER — Ambulatory Visit (INDEPENDENT_AMBULATORY_CARE_PROVIDER_SITE_OTHER): Payer: Managed Care, Other (non HMO) | Admitting: Orthopaedic Surgery

## 2019-08-08 ENCOUNTER — Other Ambulatory Visit: Payer: Self-pay

## 2019-08-08 DIAGNOSIS — M7061 Trochanteric bursitis, right hip: Secondary | ICD-10-CM

## 2019-08-08 NOTE — Progress Notes (Signed)
The patient is now 3 weeks out from surgery on her right hip for trochanteric bursitis.  She has a history of a right total hip arthroplasty and the hip implant itself has been fine but she is had severe pain over the trochanteric area.  On exam her hip incision looks good.  Her remove the old Steri-Strips.  There is still a moderate seroma and I was able to aspirate 120 cc of fluid from the hip area.  She will still be guarded with her activities with her right hip and not lay on that hip.  I would like to see her back in just 1 week to likely continue to aspirate the seroma until this does not accumulate anymore.  Hopefully with time this will improve her pain from the work we did over the bursa area of her hip.

## 2019-08-15 ENCOUNTER — Encounter: Payer: Self-pay | Admitting: Orthopaedic Surgery

## 2019-08-15 ENCOUNTER — Other Ambulatory Visit: Payer: Self-pay

## 2019-08-15 ENCOUNTER — Ambulatory Visit (INDEPENDENT_AMBULATORY_CARE_PROVIDER_SITE_OTHER): Payer: Managed Care, Other (non HMO) | Admitting: Orthopaedic Surgery

## 2019-08-15 DIAGNOSIS — M7061 Trochanteric bursitis, right hip: Secondary | ICD-10-CM

## 2019-08-15 NOTE — Progress Notes (Signed)
The patient comes in today at between 3 and 4 weeks status post a trochanteric bursectomy and IT band lengthening on the right hip.  She has had chronic pain in this area after hip replacement surgery done in 2018.  At her visit last week I did drain a large seroma from her hip.  On exam today she does have a reaccumulation of the seroma.  The incision itself looks great.  She is much less sensitive to me touching around the incision which I think is a good prognostic indicator that she will do well.  I did drain at least 80 to 90 cc of seroma fluid again from around her thigh area.  I gave her encouragement that I do think this is going to continue to improve.  I would like to see her back in just 1 week to see if we need to continue to drain the seroma.  There is no evidence of infection either.

## 2019-08-23 ENCOUNTER — Ambulatory Visit (INDEPENDENT_AMBULATORY_CARE_PROVIDER_SITE_OTHER): Payer: Medicare Other | Admitting: Physician Assistant

## 2019-08-23 ENCOUNTER — Other Ambulatory Visit: Payer: Self-pay

## 2019-08-23 ENCOUNTER — Encounter: Payer: Self-pay | Admitting: Physician Assistant

## 2019-08-23 DIAGNOSIS — M7061 Trochanteric bursitis, right hip: Secondary | ICD-10-CM

## 2019-08-23 MED ORDER — OXYCODONE HCL 5 MG PO TABS: 5.0000 mg | ORAL_TABLET | Freq: Four times a day (QID) | ORAL | 0 refills | Status: DC | PRN

## 2019-08-23 NOTE — Progress Notes (Signed)
HPI: Emily Hester returns now approximately 5 weeks status post right hip trochanteric bursectomy and IT band lengthening.  Still having pain.  She is wondering if seroma needs the drained again.  Also asking for refill on her pain medication.  States her pain is 8 out of 10 pain.  Physical exam: Right hip surgical incisions healing well no signs of infection.  Positive seroma.  No abnormal warmth or erythema.  She is ambulating with a cane with an antalgic gait.  Impression: 5 weeks status post right hip trochanteric bursectomy and IT band lengthening  Plan: Right hips prepped with Betadine seromas aspirated 50 cc of serosanguineous fluids aspirated patient tolerates well.  She will follow-up with [name] in early next week to see if she needs the hip drained again.  She will work on scar tissue mobilization to help with the scarring process and to desensitize the area.  Questions were encouraged and answered at length

## 2019-08-23 NOTE — Addendum Note (Signed)
Addended by: Richardean Canal on: 08/23/2019 02:27 PM   Modules accepted: Orders

## 2019-08-28 ENCOUNTER — Ambulatory Visit: Payer: Managed Care, Other (non HMO) | Admitting: Physician Assistant

## 2019-08-30 ENCOUNTER — Ambulatory Visit (INDEPENDENT_AMBULATORY_CARE_PROVIDER_SITE_OTHER): Payer: PRIVATE HEALTH INSURANCE | Admitting: Allergy

## 2019-08-30 ENCOUNTER — Other Ambulatory Visit: Payer: Self-pay

## 2019-08-30 ENCOUNTER — Encounter: Payer: Self-pay | Admitting: Allergy

## 2019-08-30 VITALS — BP 124/70 | HR 93 | Temp 98.7°F | Resp 16 | Ht 65.0 in | Wt 278.8 lb

## 2019-08-30 DIAGNOSIS — H1013 Acute atopic conjunctivitis, bilateral: Secondary | ICD-10-CM | POA: Diagnosis not present

## 2019-08-30 DIAGNOSIS — J3089 Other allergic rhinitis: Secondary | ICD-10-CM | POA: Diagnosis not present

## 2019-08-30 DIAGNOSIS — J4520 Mild intermittent asthma, uncomplicated: Secondary | ICD-10-CM | POA: Diagnosis not present

## 2019-08-30 NOTE — Progress Notes (Signed)
Follow-up Note  RE: Emily Hester MRN: [id number] DOB: [birth year] Date of Office Visit: 08/30/2019   History of present illness: Emily Hester is a 49 y.o. female presenting today for follow-up of asthma and allergic rhinitis.  She was last seen in the office on 03/29/2019 by myself.  She states with her asthma that she has been doing better with taking her medications routinely.  She is doing Wixela 250/5 taking 1 puff twice a day and Singulair daily.  She states one of her big triggers are any type of smell or odor.  This it really irritates her airway.  Otherwise she is not needed to use her albuterol often and has not required any ED or urgent care visits or any systemic steroid needs. With her allergies she states she has been having some itchy eyes with this pollen season.  She does continue Zyrtec daily with the Singulair as above.  She states the Atrovent helps with nasal congestion and drainage control.  She is taking it pretty much daily.  At this time she does not have access to any allergy based eyedrops as the prescription eyedrops was going to cost about $300 with her insurance coverage.  Review of systems: Review of Systems  Constitutional: Negative.   HENT: Negative.   Eyes:       See HPI  Respiratory: Negative.   Cardiovascular: Negative.   Gastrointestinal: Negative.   Musculoskeletal: Negative.   Skin: Negative.   Neurological: Negative.     All other systems negative unless noted above in HPI  Past medical/social/surgical/family history have been reviewed and are unchanged unless specifically indicated below.  No changes  Medication List: Current Outpatient Medications  Medication Sig Dispense Refill  . albuterol (VENTOLIN HFA) 108 (90 Base) MCG/ACT inhaler Inhale 2 puffs into the lungs every 4 (four) hours as needed for wheezing or shortness of breath. 18 g 1  . amLODipine (NORVASC) 10 MG tablet Take 10 mg by mouth every morning.    . cetirizine  (ZYRTEC) 10 MG tablet Take 1 tablet (10 mg total) by mouth daily. 30 tablet 5  . Fluticasone-Salmeterol (WIXELA INHUB) 250-50 MCG/DOSE AEPB Inhale 1 puff into the lungs 2 (two) times daily. 60 each 5  . ipratropium (ATROVENT) 0.06 % nasal spray Place 2 sprays into both nostrils 3 (three) times daily. 15 mL 5  . levonorgestrel (MIRENA) 20 MCG/24HR IUD 1 each by Intrauterine route once.    Marland Kitchen losartan (COZAAR) 25 MG tablet Take 25 mg by mouth daily.  0  . metFORMIN (GLUCOPHAGE-XR) 750 MG 24 hr tablet     . montelukast (SINGULAIR) 10 MG tablet Take 1 tablet (10 mg total) by mouth at bedtime. 30 tablet 5  . omeprazole (PRILOSEC) 20 MG capsule Take 1 capsule (20 mg total) by mouth every morning. (Patient taking differently: Take 20 mg by mouth 2 (two) times daily. ) 30 capsule 5  . ONETOUCH VERIO test strip as directed.   3  . oxyCODONE (ROXICODONE) 5 MG immediate release tablet Take 1-2 tablets (5-10 mg total) by mouth every 6 (six) hours as needed for severe pain. 40 tablet 0  . rosuvastatin (CRESTOR) 10 MG tablet Take 10 mg by mouth daily.  3  . STEGLATRO 5 MG TABS     . tiZANidine (ZANAFLEX) 4 MG tablet TAKE 1 TABLET BY MOUTH THREE TIMES A DAY 60 tablet 0  . traMADol (ULTRAM) 50 MG tablet TAKE 1-2 TABLETS (50-100 MG TOTAL) BY MOUTH EVERY  6 (SIX) HOURS AS NEEDED. 60 tablet 0  . JANUVIA 100 MG tablet Take 100 mg by mouth daily. (Patient not taking: Reported on 08/30/2019)    . olopatadine (PATANOL) 0.1 % ophthalmic solution Place 1 drop into both eyes daily as needed for allergies. (Patient not taking: Reported on 08/30/2019) 5 mL 5  . TRULICITY 5.57 DU/2.0UR SOPN Inject 0.5 mg as directed once a week.     No current facility-administered medications for this visit.     Known medication allergies: Allergies  Allergen Reactions  . Amoxicillin Hives, Swelling and Other (See Comments)    Has patient had a PCN reaction causing immediate rash, facial/tongue/throat swelling, SOB or lightheadedness with  hypotension: Yes Has patient had a PCN reaction causing severe rash involving mucus membranes or skin necrosis: No Has patient had a PCN reaction that required hospitalization: Yes Has patient had a PCN reaction occurring within the last 10 years: No  If all of the above answers are "NO", then may proceed with Cephalosporin use.   . Sulfamethoxazole-Trimethoprim Rash  . Lisinopril Itching and Cough  . Bactrim Rash     Physical examination: Blood pressure 124/70, pulse 93, temperature 98.7 F (37.1 C), temperature source Oral, resp. rate 16, height 5\' 5"  (1.651 m), weight 278 lb 12.8 oz (126.5 kg), SpO2 97 %.  General: Alert, interactive, in no acute distress. HEENT: PERRLA, TMs pearly gray, turbinates non-edematous without discharge, post-pharynx non erythematous. Neck: Supple without lymphadenopathy. Lungs: Clear to auscultation without wheezing, rhonchi or rales. {no increased work of breathing. CV: Normal S1, S2 without murmurs. Abdomen: Nondistended, nontender. Skin: Warm and dry, without lesions or rashes. Extremities:  No clubbing, cyanosis or edema. Neuro:   Grossly intact.  Diagnositics/Labs:   Spirometry: FEV1: 1.73 L 72%, FVC: 2.13 L 71% of predicted.  These are reduced for patient's age and demographics.    Assessment and plan:   1. Asthma - control is improved however lung function could be better - continue Wixela 250/50 1 puff twice a day.  - continue Singulair (Montelukast) 10mg  nightly - have access to albuterol inhaler 2 puffs every 4-6 hours as needed for cough/wheeze/shortness of breath/chest tightness.  May use 15-20 minutes prior to activity.   Monitor frequency of use.  - recommend use of your albuterol prior to outdoor activity and grilling   Asthma control goals:   Full participation in all desired activities (may need albuterol before activity)  Albuterol use two time or less a week on average (not counting use with activity)  Cough interfering  with sleep two time or less a month  Oral steroids no more than once a year  No hospitalizations   2. Allergic Rhinitis - continue allergen avoidance measures - continue Zyrtec (Cetirizine) 10 mg daily  - continue Singulair (Montelukast) nightly - for nasal drainage/post-nasal drip use nasal Atrovent 0.06% 2 sprays each nostril up to 3-4 times a day as needed - use over-the-counter Pataday Xtra Strength, Pataday 1 drop each eye daily or Zaditor or Alaway 1 drop each eye twice a day as needed for itchy/watery/red eyes.   These are all over-the-counter allergy eyedrop you can use.   - Follow up within 4-6 months or sooner if needed  I appreciate the opportunity to take part in [place] care. Please do not hesitate to contact me with questions.  Sincerely,   Prudy Feeler, MD Allergy/Immunology [facility]

## 2019-08-30 NOTE — Patient Instructions (Signed)
1. Asthma - control is improved - continue Wixela 250/50 1 puff twice a day.  - continue Singulair (Montelukast) 10mg  nightly - have access to albuterol inhaler 2 puffs every 4-6 hours as needed for cough/wheeze/shortness of breath/chest tightness.  May use 15-20 minutes prior to activity.   Monitor frequency of use.  - recommend use of your albuterol prior to outdoor activity and grilling   Asthma control goals:   Full participation in all desired activities (may need albuterol before activity)  Albuterol use two time or less a week on average (not counting use with activity)  Cough interfering with sleep two time or less a month  Oral steroids no more than once a year  No hospitalizations   2. Allergic Rhinitis - continue allergen avoidance measures - continue Zyrtec (Cetirizine) 10 mg daily  - continue Singulair (Montelukast) nightly - for nasal drainage/post-nasal drip use nasal Atrovent 0.06% 2 sprays each nostril up to 3-4 times a day as needed - use over-the-counter Pataday Xtra Strength, Pataday 1 drop each eye daily or Zaditor or Alaway 1 drop each eye twice a day as needed for itchy/watery/red eyes.   These are all over-the-counter allergy eyedrop you can use.   - Follow up within 4-6 months or sooner if needed

## 2019-08-31 DIAGNOSIS — M1990 Unspecified osteoarthritis, unspecified site: Secondary | ICD-10-CM | POA: Insufficient documentation

## 2019-08-31 DIAGNOSIS — E119 Type 2 diabetes mellitus without complications: Secondary | ICD-10-CM | POA: Insufficient documentation

## 2019-08-31 DIAGNOSIS — E785 Hyperlipidemia, unspecified: Secondary | ICD-10-CM | POA: Insufficient documentation

## 2019-08-31 DIAGNOSIS — N3281 Overactive bladder: Secondary | ICD-10-CM | POA: Insufficient documentation

## 2019-09-05 ENCOUNTER — Ambulatory Visit (INDEPENDENT_AMBULATORY_CARE_PROVIDER_SITE_OTHER): Payer: Medicare Other | Admitting: Physician Assistant

## 2019-09-05 ENCOUNTER — Other Ambulatory Visit: Payer: Self-pay

## 2019-09-05 ENCOUNTER — Encounter: Payer: Self-pay | Admitting: Physician Assistant

## 2019-09-05 DIAGNOSIS — M7061 Trochanteric bursitis, right hip: Secondary | ICD-10-CM

## 2019-09-05 MED ORDER — TIZANIDINE HCL 4 MG PO TABS: 4.0000 mg | ORAL_TABLET | Freq: Three times a day (TID) | ORAL | 0 refills | Status: DC

## 2019-09-05 MED ORDER — TRAMADOL HCL 50 MG PO TABS: 50.0000 mg | ORAL_TABLET | Freq: Four times a day (QID) | ORAL | 0 refills | Status: DC | PRN

## 2019-09-05 NOTE — Progress Notes (Signed)
HPI: [name] returns today for follow-up status post right hip bursectomy.  She is now 6 weeks status post.  She states she is having quite a bit of pain groin IT band area.  She feels she may have more fluid on the hip again.  She been trying heat and ice.  Is taking tramadol and Zanaflex.  Taking  800 mg Motrin daily.  Physical exam: Right hip good range of motion without pain.  Tenderness over the lateral aspect of the hip and over the incision site.  No signs of infection.  No wound dehiscence.  Slight seroma.  This area was aspirated 20 cc of serosanguineous fluids obtained patient tolerates well.  Impression: 6 weeks status post right hip trochanteric bursectomy and IT band lengthening  Plan: Refill on her tramadol and Zanaflex given today.  She will go up to 800 mg twice daily.  Recommend she use heat over the area.  Scar tissue mobilization.  Follow-up with [name] in 2 weeks sooner if there is any questions concerns.

## 2019-09-24 ENCOUNTER — Ambulatory Visit (INDEPENDENT_AMBULATORY_CARE_PROVIDER_SITE_OTHER): Payer: Managed Care, Other (non HMO) | Admitting: Physician Assistant

## 2019-09-24 ENCOUNTER — Encounter: Payer: Self-pay | Admitting: Physician Assistant

## 2019-09-24 ENCOUNTER — Other Ambulatory Visit: Payer: Self-pay

## 2019-09-24 DIAGNOSIS — M7061 Trochanteric bursitis, right hip: Secondary | ICD-10-CM

## 2019-09-24 NOTE — Progress Notes (Signed)
HPI: Emily Hester returns today status post right hip bursectomy 07/19/2019.  She states she is starting to get better.  Still having some burning and stiffness about the hip.  She feels her gait is slightly off.  Physical exam: Right hip good range of motion without pain.  Surgical incisions well-healed.  No signs of infection.  No wound dehiscence.  No seroma.   Impression: Status post right hip trochanteric bursectomy with IT band lengthening 8 weeks.  Plan: We will see her back in 1 month.  She will continue to use heat or ice over the area as needed.  Scar tissue mobilization encouraged.  IT band and hamstring stretching exercises shown.  Questions encouraged and answered.

## 2019-10-01 ENCOUNTER — Other Ambulatory Visit: Payer: Self-pay | Admitting: Physician Assistant

## 2019-10-01 NOTE — Telephone Encounter (Signed)
Left voice mail

## 2019-10-03 ENCOUNTER — Encounter: Payer: Self-pay | Admitting: Physician Assistant

## 2019-10-03 ENCOUNTER — Ambulatory Visit (INDEPENDENT_AMBULATORY_CARE_PROVIDER_SITE_OTHER): Payer: Managed Care, Other (non HMO) | Admitting: Physician Assistant

## 2019-10-03 ENCOUNTER — Ambulatory Visit (INDEPENDENT_AMBULATORY_CARE_PROVIDER_SITE_OTHER): Payer: Managed Care, Other (non HMO)

## 2019-10-03 ENCOUNTER — Other Ambulatory Visit: Payer: Self-pay

## 2019-10-03 VITALS — Ht 65.0 in | Wt 278.8 lb

## 2019-10-03 DIAGNOSIS — Z96641 Presence of right artificial hip joint: Secondary | ICD-10-CM | POA: Diagnosis not present

## 2019-10-03 DIAGNOSIS — M5431 Sciatica, right side: Secondary | ICD-10-CM

## 2019-10-03 MED ORDER — HYDROCODONE-ACETAMINOPHEN 5-325 MG PO TABS: 1.0000 | ORAL_TABLET | Freq: Four times a day (QID) | ORAL | 0 refills | Status: DC | PRN

## 2019-10-03 NOTE — Progress Notes (Signed)
Office Visit Note   [name]           Date of Birth: [birth year]           MRN: [id number] Visit Date: 10/03/2019              Requested by: Emily Brunette, MD [phone] [address] [place],  [place] [address] PCP: Emily Brunette, MD   Assessment & Plan: Visit Diagnoses:  1. Status post right hip replacement   2. Sciatica, right side     Plan: We will send her for repeat epidural steroid injection lumbar spine.  Did give her some Norco for pain.  Follow-up 4 weeks after the injection.  Did not place her on a Medrol Dosepak due to her diabetes.  Recommended formal physical therapy she will check with her insurance as they have been unwilling to cover therapy in the past.  She will let us know if she wants therapy prescription this would be for IT band stretching, back exercises, core strengthening, home exercise program modalities.  Questions were encouraged and answered at length.  She will follow-up with [name] sooner if there is any questions concerns.  Follow-Up Instructions: Return 4weeks after ESI.   Orders:  Orders Placed This Encounter  Procedures  . XR HIP UNILAT W OR W/O PELVIS 2-3 VIEWS RIGHT   No orders of the defined types were placed in this encounter.     Procedures: No procedures performed   Clinical Data: No additional findings.   Subjective: Chief Complaint  Patient presents with  . Right Hip - Pain    Hx of Right THA    HPI Emily Hester returns early today due to sudden onset of pain radiating from her right hip down to her knee.  She states she is having new onset of numbness tingling right thigh.  She is unable to bear weight on the right leg due to the pain.  No known acute injury.  Pain does awaken her.  No bowel bladder dysfunction or saddle anesthesia like symptoms.  We last saw her on 09/24/2019 and at that point she was trending towards improvement status post right hip bursectomy 07/19/2019.  She reports she is taking  tramadol for the pain but this is not really helping.  She is diabetic reports good control. History MRI lumbar spine June 2020 did show right L5-S1 annular rent that was similar to previous MRI in 2018.  But was felt could be the source of her right sided radicular pain at that time.  She was sent for for a repeat S1 transforaminal injection on the right x2.  Both of these injection gave her some relief.  Review of Systems See HPI otherwise negative or noncontributory.  Objective: Vital Signs: Ht 5\' 5"  (1.651 m)   Wt 278 lb 12.8 oz (126.5 kg)   BMI 46.39 kg/m   Physical Exam General well-developed well-nourished female in no acute distress seated in wheelchair. Ortho Exam Lower extremities 5 out of 5 strength throughout against resistance.  Positive straight leg raise on the right negative on the left.  Good range of motion of both hips.  Pain with external rotation of the right hip.  Sensation intact bilateral feet.  Dorsal pedal pulses are present bilaterally. Specialty Comments:  No specialty comments available.  Imaging: XR HIP UNILAT W OR W/O PELVIS 2-3 VIEWS RIGHT  Result Date: 10/03/2019 AP pelvis lateral view right hip: No acute fractures.  Bilateral hips well located.  Slight reabsorption around the shoulder of the femoral component but otherwise no signs of loosening of the total hip arthroplasty components.  Findings are unchanged from films in December 2020.    PMFS History: Patient Active Problem List   Diagnosis Date Noted  . Trochanteric bursitis, right hip 09/05/2019  . 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  . Unilateral primary osteoarthritis, right hip 01/27/2017   Past Medical History:  Diagnosis Date  . Arthritis   . Asthma   . Diabetes mellitus without complication (HCC)    type   . Hypertension   . Interstitial cystitis   . IUD (intrauterine device) in place placed 6 weeks ago  . Migraines     Family History  Problem Relation Age of  Onset  . Diabetes Mother   . Heart disease Mother   . Diabetes Father   . Eczema Sister   . Asthma Brother   . Asthma Son   . Eczema Daughter   . Allergic rhinitis Neg Hx   . Angioedema Neg Hx   . Immunodeficiency Neg Hx   . Urticaria Neg Hx     Past Surgical History:  Procedure Laterality Date  . DILATION AND EVACUATION  09/30/2011   Procedure: DILATATION AND EVACUATION;  Surgeon: Emily Andrea, MD;  Location: [facility];  Service: Gynecology;  Laterality: N/A;  . left knee surgery      arthroscopy   . TOTAL HIP ARTHROPLASTY Right 03/11/2017   Procedure: RIGHT TOTAL HIP ARTHROPLASTY ANTERIOR APPROACH;  Surgeon: Emily Hitch, MD;  Location: [facility];  Service: Orthopedics;  Laterality: Right;   Social History   Occupational History  . Not on file  Tobacco Use  . Smoking status: Never Smoker  . Smokeless tobacco: Never Used  Vaping Use  . Vaping Use: Never used  Substance and Sexual Activity  . Alcohol use: No  . Drug use: No  . Sexual activity: Not on file

## 2019-10-08 ENCOUNTER — Other Ambulatory Visit: Payer: Self-pay

## 2019-10-08 DIAGNOSIS — M5431 Sciatica, right side: Secondary | ICD-10-CM

## 2019-10-08 DIAGNOSIS — M7061 Trochanteric bursitis, right hip: Secondary | ICD-10-CM

## 2019-10-17 ENCOUNTER — Other Ambulatory Visit: Payer: Self-pay

## 2019-10-17 MED ORDER — HYDROCODONE-ACETAMINOPHEN 5-325 MG PO TABS: 1.0000 | ORAL_TABLET | Freq: Four times a day (QID) | ORAL | 0 refills | Status: DC | PRN

## 2019-10-17 NOTE — Telephone Encounter (Signed)
Please advise 

## 2019-10-22 ENCOUNTER — Ambulatory Visit: Payer: Managed Care, Other (non HMO) | Admitting: Orthopaedic Surgery

## 2019-10-25 ENCOUNTER — Encounter: Payer: Self-pay | Admitting: Physical Medicine and Rehabilitation

## 2019-10-25 ENCOUNTER — Other Ambulatory Visit: Payer: Self-pay

## 2019-10-25 ENCOUNTER — Ambulatory Visit (INDEPENDENT_AMBULATORY_CARE_PROVIDER_SITE_OTHER): Payer: Managed Care, Other (non HMO) | Admitting: Physical Medicine and Rehabilitation

## 2019-10-25 ENCOUNTER — Ambulatory Visit: Payer: Self-pay

## 2019-10-25 VITALS — BP 134/81 | HR 97 | Wt 273.0 lb

## 2019-10-25 DIAGNOSIS — M5416 Radiculopathy, lumbar region: Secondary | ICD-10-CM

## 2019-10-25 MED ORDER — METHYLPREDNISOLONE ACETATE 80 MG/ML IJ SUSP
80.0000 mg | Freq: Once | INTRAMUSCULAR | Status: AC
  Administered 2019-10-25: 80 mg

## 2019-10-25 NOTE — Progress Notes (Signed)
 .  Numeric Pain Rating Scale and Functional Assessment Average Pain 6   In the last MONTH (on 0-10 scale) has pain interfered with the following?  1. General activity like being  able to carry out your everyday physical activities such as walking, climbing stairs, carrying groceries, or moving a chair?  Rating(6)   +Driver, -BT, -Dye Allergies.  

## 2019-10-26 NOTE — Progress Notes (Signed)
Emily Hester - 49 y.o. female MRN [id number]  Date of birth: 1970/12/28  Office Visit Note: Visit Date: 10/25/2019 PCP: Merri Brunette, MD Referred by: Merri Brunette, MD  Subjective: Chief Complaint  Patient presents with  . Spine - Pain, Numbness   HPI:  Emily Hester is a 49 y.o. female who comes in today at the request of Rexene Edison, PA-C for planned Right S1-2 Lumbar epidural steroid injection with fluoroscopic guidance.  The patient has failed conservative care including home exercise, medications, time and activity modification.  This injection will be diagnostic and hopefully therapeutic.  Please see requesting physician notes for further details and justification.  MRI reviewed with images and spine model.  MRI reviewed in the note below.   ROS Otherwise per HPI.  Assessment & Plan: Visit Diagnoses:  1. Lumbar radiculopathy     Plan: No additional findings.   Meds & Orders:  Meds ordered this encounter  Medications  . methylPREDNISolone acetate (DEPO-MEDROL) injection 80 mg    Orders Placed This Encounter  Procedures  . XR C-ARM NO REPORT  . Epidural Steroid injection    Follow-up: Return for [name], MD as scheduled on 11/26/2019.   Procedures: No procedures performed  S1 Lumbosacral Transforaminal Epidural Steroid Injection - Sub-Pedicular Approach with Fluoroscopic Guidance   [name]      Date of Birth: Mar 11, 1971 MRN: [id number] PCP: Merri Brunette, MD      Visit Date: 10/25/2019   Universal Protocol:    Date/Time: 08/13/216:17 AM  Consent Given By: the patient  Position:  PRONE  Additional Comments: Vital signs were monitored before and after the procedure. Patient was prepped and draped in the usual sterile fashion. The correct patient, procedure, and site was verified.   Injection Procedure Details:  Procedure Site One Meds Administered:  Meds ordered this encounter  Medications  .  methylPREDNISolone acetate (DEPO-MEDROL) injection 80 mg    Laterality: Right  Location/Site:  S1 Foramen   Needle size: 22 ga.  Needle type: Spinal  Needle Placement: Transforaminal  Findings:   -Comments: Excellent flow of contrast along the nerve and into the epidural space.   Procedure Details: After squaring off the sacral end-plate to get a true AP view, the C-arm was positioned so that the best possible view of the S1 foramen was visualized. The soft tissues overlying this structure were infiltrated with 2-3 ml. of 1% Lidocaine without Epinephrine.    The spinal needle was inserted toward the target using a "trajectory" view along the fluoroscope beam.  Under AP and lateral visualization, the needle was advanced so it did not puncture dura. Biplanar projections were used to confirm position. Aspiration was confirmed to be negative for CSF and/or blood. A 1-2 ml. volume of Isovue-250 was injected and flow of contrast was noted at each level. Radiographs were obtained for documentation purposes.   After attaining the desired flow of contrast documented above, a 0.5 to 1.0 ml test dose of 0.25% Marcaine was injected into each respective transforaminal space.  The patient was observed for 90 seconds post injection.  After no sensory deficits were reported, and normal lower extremity motor function was noted,   the above injectate was administered so that equal amounts of the injectate were placed at each foramen (level) into the transforaminal epidural space.   Additional Comments:  The patient tolerated the procedure well Dressing: Band-Aid with 2 x 2 sterile gauze    Post-procedure details: Patient was observed  during the procedure. Post-procedure instructions were reviewed.  Patient left the clinic in stable condition.     Clinical History: MRI LUMBAR SPINE WITHOUT CONTRAST  TECHNIQUE: Multiplanar, multisequence MR imaging of the lumbar spine was performed. No  intravenous contrast was administered.  COMPARISON:  07/28/2016.  FINDINGS: Segmentation:  Standard.  Alignment:  Physiologic.  Vertebrae: Mild endplate changes are stable. No fracture, evidence of diskitis, or bone lesion.  Conus medullaris and cauda equina: Conus extends to the T12-L1 level. Conus and cauda equina appear normal.  Paraspinal and other soft tissues: Is increased body habitus without features of epidural lipomatosis.  Disc levels:  L1-L2: Loss of disc height with preserved disc signal. Small foraminal protrusion on the LEFT. No definite compressive subarticular zone or foraminal zone narrowing.  L2-L3:  Unremarkable.  L3-L4: Unremarkable disc space. Facet arthropathy. No impingement.  L4-L5: Unremarkable disc space. Facet arthropathy. No impingement.  L5-S1: RIGHT L5-S1 annular rent. Disc desiccation. Mild facet arthropathy. No stenosis, or neural impingement. Specifically, no RIGHT-sided compressive lesion  Compared with priors, similar appearance.  IMPRESSION: RIGHT L5-S1 annular rent, is not associated with a disc protrusion nor neural impingement. The appearance is fairly similar to 2018. This abnormality could serve as a source of RIGHT-sided radicular pain.  Consider lumbar epidural RIGHT S1 nerve root block with transforaminal steroid injection as a diagnostic and potentially therapeutic maneuver.  Small foraminal protrusion L1-2 on the LEFT, doubt significance.  Lower lumbar facet arthropathy without significant stenosis or RIGHT-sided impingement.   Electronically Signed   By: Elsie Stain M.D.   On: 08/24/2018 16:03     Objective:  VS:  HT:    WT:273 lb (123.8 kg)  BMI:     BP:134/81  HR:97bpm  TEMP: ( )  RESP:  Physical Exam Constitutional:      General: She is not in acute distress.    Appearance: Normal appearance. She is obese. She is not ill-appearing.  HENT:     Head: Normocephalic and  atraumatic.     Right Ear: External ear normal.     Left Ear: External ear normal.  Eyes:     Extraocular Movements: Extraocular movements intact.  Cardiovascular:     Rate and Rhythm: Normal rate.     Pulses: Normal pulses.  Musculoskeletal:     Right lower leg: No edema.     Left lower leg: No edema.     Comments: Patient has good distal strength.  She ambulates with a cane.  She has pain over the greater trochanter.  Really more pain than expected with movement.  She has good distal strength.   Skin:    Findings: No erythema, lesion or rash.  Neurological:     General: No focal deficit present.     Mental Status: She is alert and oriented to person, place, and time.     Sensory: No sensory deficit.     Motor: No weakness or abnormal muscle tone.     Coordination: Coordination normal.  Psychiatric:        Mood and Affect: Mood normal.        Behavior: Behavior normal.      Imaging: XR C-ARM NO REPORT  Result Date: 10/25/2019 Please see Notes tab for imaging impression.

## 2019-10-26 NOTE — Procedures (Signed)
S1 Lumbosacral Transforaminal Epidural Steroid Injection - Sub-Pedicular Approach with Fluoroscopic Guidance   [name]      Date of Birth: 1970-10-12 MRN: [id number] PCP: Merri Brunette, MD      Visit Date: 10/25/2019   Universal Protocol:    Date/Time: 08/13/216:17 AM  Consent Given By: the patient  Position:  PRONE  Additional Comments: Vital signs were monitored before and after the procedure. Patient was prepped and draped in the usual sterile fashion. The correct patient, procedure, and site was verified.   Injection Procedure Details:  Procedure Site One Meds Administered:  Meds ordered this encounter  Medications  . methylPREDNISolone acetate (DEPO-MEDROL) injection 80 mg    Laterality: Right  Location/Site:  S1 Foramen   Needle size: 22 ga.  Needle type: Spinal  Needle Placement: Transforaminal  Findings:   -Comments: Excellent flow of contrast along the nerve and into the epidural space.   Procedure Details: After squaring off the sacral end-plate to get a true AP view, the C-arm was positioned so that the best possible view of the S1 foramen was visualized. The soft tissues overlying this structure were infiltrated with 2-3 ml. of 1% Lidocaine without Epinephrine.    The spinal needle was inserted toward the target using a "trajectory" view along the fluoroscope beam.  Under AP and lateral visualization, the needle was advanced so it did not puncture dura. Biplanar projections were used to confirm position. Aspiration was confirmed to be negative for CSF and/or blood. A 1-2 ml. volume of Isovue-250 was injected and flow of contrast was noted at each level. Radiographs were obtained for documentation purposes.   After attaining the desired flow of contrast documented above, a 0.5 to 1.0 ml test dose of 0.25% Marcaine was injected into each respective transforaminal space.  The patient was observed for 90 seconds post injection.  After no  sensory deficits were reported, and normal lower extremity motor function was noted,   the above injectate was administered so that equal amounts of the injectate were placed at each foramen (level) into the transforaminal epidural space.   Additional Comments:  The patient tolerated the procedure well Dressing: Band-Aid with 2 x 2 sterile gauze    Post-procedure details: Patient was observed during the procedure. Post-procedure instructions were reviewed.  Patient left the clinic in stable condition.

## 2019-11-26 ENCOUNTER — Other Ambulatory Visit: Payer: Self-pay

## 2019-11-26 ENCOUNTER — Ambulatory Visit: Payer: Managed Care, Other (non HMO) | Admitting: Orthopaedic Surgery

## 2019-11-26 MED ORDER — HYDROCODONE-ACETAMINOPHEN 5-325 MG PO TABS: 1.0000 | ORAL_TABLET | Freq: Four times a day (QID) | ORAL | 0 refills | Status: DC | PRN

## 2019-11-26 NOTE — Telephone Encounter (Signed)
CB pt 

## 2019-11-26 NOTE — Telephone Encounter (Signed)
Please advise 

## 2019-11-28 ENCOUNTER — Ambulatory Visit (INDEPENDENT_AMBULATORY_CARE_PROVIDER_SITE_OTHER): Payer: Managed Care, Other (non HMO) | Admitting: Orthopaedic Surgery

## 2019-11-28 ENCOUNTER — Other Ambulatory Visit: Payer: Self-pay

## 2019-11-28 ENCOUNTER — Encounter: Payer: Self-pay | Admitting: Orthopaedic Surgery

## 2019-11-28 DIAGNOSIS — Z96641 Presence of right artificial hip joint: Secondary | ICD-10-CM | POA: Diagnosis not present

## 2019-11-28 DIAGNOSIS — M25551 Pain in right hip: Secondary | ICD-10-CM | POA: Diagnosis not present

## 2019-11-28 NOTE — Progress Notes (Signed)
The patient is well-known to me.  She is getting close to 3 years out from a right total hip arthroplasty direct under approach.  She has had chronic hip pain ever since the surgery.  She does have a small tear in a disc to the right side at L5-S1 but there is because no neural impingement.  Even repeat MRIs of the lumbar spine and hip showed no significant issues.  However due to continued severe bursitis around her right hip I did take her to the operating room several months ago and performed a bursectomy of the trochanteric area as well as lengthening the IT band.  She still has severe pain around her right hip.  About a month ago Dr. Alvester Morin provided an injection to the right sided S1 which had been recommended even from radiology.  The patient reports that she had 2 weeks of doing well but then she started to have discomfort again last week.  On exam I can easily put her right hip through internal and external rotation and compression without any pain at all.  She still has quite severe pain over the trochanteric area but she also has right-sided pelvic and ischial pain and low back pain.  At this point I would like to obtain a new MRI of the right hip to rule out any type of fluid collections or prosthetic loosening that is causing the pain in her right hip.  Her plain films show reabsorption around the shoulder of the implant to suggest prosthetic loosening but this is not shown on bone scan or MRI in the past.  However, with the severity of her pain, I would like to assess this hip again under MRI to help [name] come up with the best treatment plan for her given the severity of her pain has been going on ever since we did the surgery.  We may end up recommending a revision arthroplasty of the right hip and I may also send her as an opinion to a spine specialist as well.  She agrees with this treatment plan.

## 2019-11-29 ENCOUNTER — Telehealth: Payer: Self-pay | Admitting: Orthopaedic Surgery

## 2019-11-29 NOTE — Telephone Encounter (Signed)
Patient called.   Wanted to explain that the paperwork she left yesterday was for a parent loan forgiveness program  Call back: [phone]

## 2019-12-04 ENCOUNTER — Encounter: Payer: Self-pay | Admitting: Orthopaedic Surgery

## 2019-12-07 ENCOUNTER — Encounter: Payer: Self-pay | Admitting: Orthopaedic Surgery

## 2019-12-12 ENCOUNTER — Ambulatory Visit: Payer: Managed Care, Other (non HMO) | Admitting: Orthopaedic Surgery

## 2019-12-27 ENCOUNTER — Encounter: Payer: Self-pay | Admitting: Orthopaedic Surgery

## 2019-12-27 DIAGNOSIS — M4807 Spinal stenosis, lumbosacral region: Secondary | ICD-10-CM

## 2019-12-28 ENCOUNTER — Other Ambulatory Visit: Payer: Self-pay | Admitting: Orthopaedic Surgery

## 2019-12-31 MED ORDER — HYDROCODONE-ACETAMINOPHEN 5-325 MG PO TABS: 1.0000 | ORAL_TABLET | Freq: Four times a day (QID) | ORAL | 0 refills | Status: DC | PRN

## 2020-01-01 NOTE — Telephone Encounter (Signed)
Pending insurance authorization thru [employer].

## 2020-01-02 ENCOUNTER — Other Ambulatory Visit: Payer: Managed Care, Other (non HMO)

## 2020-01-07 ENCOUNTER — Ambulatory Visit: Payer: Managed Care, Other (non HMO) | Admitting: Orthopaedic Surgery

## 2020-01-07 ENCOUNTER — Other Ambulatory Visit: Payer: Self-pay | Admitting: Orthopaedic Surgery

## 2020-01-07 ENCOUNTER — Encounter: Payer: Self-pay | Admitting: Orthopaedic Surgery

## 2020-01-09 NOTE — Telephone Encounter (Signed)
Letter faxed to [employer] for appal

## 2020-01-15 ENCOUNTER — Encounter: Payer: Self-pay | Admitting: Orthopaedic Surgery

## 2020-01-16 ENCOUNTER — Ambulatory Visit (INDEPENDENT_AMBULATORY_CARE_PROVIDER_SITE_OTHER): Payer: Managed Care, Other (non HMO) | Admitting: Orthopaedic Surgery

## 2020-01-16 ENCOUNTER — Encounter: Payer: Self-pay | Admitting: Orthopaedic Surgery

## 2020-01-16 DIAGNOSIS — M25551 Pain in right hip: Secondary | ICD-10-CM

## 2020-01-16 DIAGNOSIS — M7061 Trochanteric bursitis, right hip: Secondary | ICD-10-CM | POA: Diagnosis not present

## 2020-01-16 MED ORDER — LIDOCAINE HCL 1 % IJ SOLN
3.0000 mL | INTRAMUSCULAR | Status: AC | PRN
  Administered 2020-01-16: 3 mL

## 2020-01-16 MED ORDER — METHYLPREDNISOLONE ACETATE 40 MG/ML IJ SUSP
40.0000 mg | INTRAMUSCULAR | Status: AC | PRN
  Administered 2020-01-16: 40 mg via INTRA_ARTICULAR

## 2020-01-16 NOTE — Progress Notes (Signed)
Office Visit Note   [name]           Date of Birth: 1971-03-04           MRN: [id number] Visit Date: 01/16/2020              Requested by: Merri Brunette, MD [phone] [place],  [place] [address] PCP: Merri Brunette, MD   Assessment & Plan: Visit Diagnoses:  1. Trochanteric bursitis, right hip   2. Pain in right hip     Plan: I did provide a steroid injection over the right trochanteric area that did help some.  We will see her back a week from today to go over the results of the MRI that she will have next week and of her right hip.  Follow-Up Instructions: Return in about 1 week (around 01/23/2020).   Orders:  Orders Placed This Encounter  Procedures  . Large Joint Inj   No orders of the defined types were placed in this encounter.     Procedures: Large Joint Inj: R greater trochanter on 01/16/2020 8:58 AM Indications: pain and diagnostic evaluation Details: 22 G 1.5 in needle, lateral approach  Arthrogram: No  Medications: 3 mL lidocaine 1 %; 40 mg methylPREDNISolone acetate 40 MG/ML Outcome: tolerated well, no immediate complications Procedure, treatment alternatives, risks and benefits explained, specific risks discussed. Consent was given by the patient. Immediately prior to procedure a time out was called to verify the correct patient, procedure, equipment, support staff and site/side marked as required. Patient was prepped and draped in the usual sterile fashion.       Clinical Data: No additional findings.   Subjective: Chief Complaint  Patient presents with  . Right Hip - Pain  The patient comes in with continued chronic right hip pain.  She had a history of a right total hip arthroplasty we did just a few years ago.  She has more recently had a right hip trochanteric bursectomy and IT band lengthening.  She still has chronic pain all around her right hip and groin and down the leg to her knee.  It does radiate to  her foot.  She is also dealt with some degenerative disc changes to the right side.  The lower lumbar spine.  Occasion when she has had an injection by Dr. Alvester Morin that is helped some.  Her pain is still quite severe around her leg.  X-rays have not shown any worrisome features of the right hip in the past.  She is scheduled for MRI of this right hip this coming weekend.  She is requesting a steroid injection today over the trochanteric area.  She denies any other acute change in her medical status.  HPI  Review of Systems There is no listed headache, chest pain, shortness of breath, fever, chills, nausea, vomiting  Objective: Vital Signs: There were no vitals taken for this visit.  Physical Exam She is alert and orient x3 and in no acute distress Ortho Exam She is walking without an assistive device.  When I have her lay on her back and then turned with the right side up, she has severe pain to just barely palpating along the trochanteric area of her hip.  Her incision is healed nicely.  There is no swelling no redness.  There is no induration of the skin or evidence of infection. Specialty Comments:  No specialty comments available.  Imaging: No results found.   PMFS History: Patient Active  Problem List   Diagnosis Date Noted  . Trochanteric bursitis, right hip 09/05/2019  . 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  . Unilateral primary osteoarthritis, right hip 01/27/2017   Past Medical History:  Diagnosis Date  . Arthritis   . Asthma   . Diabetes mellitus without complication (HCC)    type   . Hypertension   . Interstitial cystitis   . IUD (intrauterine device) in place placed 6 weeks ago  . Migraines     Family History  Problem Relation Age of Onset  . Diabetes Mother   . Heart disease Mother   . Diabetes Father   . Eczema Sister   . Asthma Brother   . Asthma Son   . Eczema Daughter   . Allergic rhinitis Neg Hx   . Angioedema Neg Hx   .  Immunodeficiency Neg Hx   . Urticaria Neg Hx     Past Surgical History:  Procedure Laterality Date  . DILATION AND EVACUATION  09/30/2011   Procedure: DILATATION AND EVACUATION;  Surgeon: Ayrabella Andrea, MD;  Location: [facility];  Service: Gynecology;  Laterality: N/A;  . left knee surgery      arthroscopy   . TOTAL HIP ARTHROPLASTY Right 03/11/2017   Procedure: RIGHT TOTAL HIP ARTHROPLASTY ANTERIOR APPROACH;  Surgeon: Kathryne Hitch, MD;  Location: [facility];  Service: Orthopedics;  Laterality: Right;   Social History   Occupational History  . Not on file  Tobacco Use  . Smoking status: Never Smoker  . Smokeless tobacco: Never Used  Vaping Use  . Vaping Use: Never used  Substance and Sexual Activity  . Alcohol use: No  . Drug use: No  . Sexual activity: Not on file

## 2020-01-19 ENCOUNTER — Other Ambulatory Visit: Payer: Self-pay

## 2020-01-19 ENCOUNTER — Ambulatory Visit
Admission: RE | Admit: 2020-01-19 | Discharge: 2020-01-19 | Disposition: A | Discharge status: Home / Self Care | Payer: Managed Care, Other (non HMO) | Source: Ambulatory Visit | Attending: Orthopaedic Surgery | Admitting: Orthopaedic Surgery

## 2020-01-19 ENCOUNTER — Other Ambulatory Visit: Payer: Managed Care, Other (non HMO)

## 2020-01-19 DIAGNOSIS — Z96641 Presence of right artificial hip joint: Secondary | ICD-10-CM

## 2020-01-22 ENCOUNTER — Encounter: Payer: Self-pay | Admitting: Orthopaedic Surgery

## 2020-01-22 ENCOUNTER — Ambulatory Visit (INDEPENDENT_AMBULATORY_CARE_PROVIDER_SITE_OTHER): Payer: Managed Care, Other (non HMO) | Admitting: Orthopaedic Surgery

## 2020-01-22 DIAGNOSIS — M7061 Trochanteric bursitis, right hip: Secondary | ICD-10-CM

## 2020-01-22 DIAGNOSIS — Z96641 Presence of right artificial hip joint: Secondary | ICD-10-CM | POA: Diagnosis not present

## 2020-01-22 DIAGNOSIS — M5431 Sciatica, right side: Secondary | ICD-10-CM

## 2020-01-22 DIAGNOSIS — M25551 Pain in right hip: Secondary | ICD-10-CM

## 2020-01-22 MED ORDER — LIDOCAINE 5 % EX PTCH: 1.0000 | MEDICATED_PATCH | CUTANEOUS | 0 refills | Status: DC

## 2020-01-22 NOTE — Progress Notes (Signed)
Emily Hester is well-known to me.  She has chronic right hip pain following a total hip arthroplasty we did about 3 years ago.  We had performed a trochanteric bursectomy over that right hip.  She still very tender to just touch and palpation all around her right proximal trochanteric area.  At her last visit I did provide a steroid injection over the trochanteric area and she got immediate relief and at least 1 full day it was pain-free.  She does ambulate with a cane.  I sent her for an MRI of this right hip.  The MRI does suggest reabsorption of the bone in the proximal trochanteric area to suggest prosthetic loosening.  Her pain is more consistent with a chronic inflammatory process in terms of her skin and soft tissue being highly sensitive because I can just barely touch around that area and she is very sensitive.  The skin is not red.  I can easily put her hip through internal and external rotation on the right side with no deep pain in the groin or thigh.  This is not a lumbar spine issue on my exam either.  We went over the MRI findings.  My next step would likely be a revision arthroplasty of the femoral component.  However, I would least like to try a short course of Lidoderm patches around the soft tissue of her left hip to see what that does for her from a pain standpoint.  She agrees with this treatment plan.  We will see her back in just 2 weeks and we can certainly talk again about hip revision arthroplasty.  She does ambulate with a cane in her opposite hand.  She has been very compliant with everything we have asked of her including attending outpatient therapy.

## 2020-01-24 NOTE — Telephone Encounter (Signed)
Pending peer to peer with Dr. Magnus Ivan, p2p scheduled nov 15 at 1130a with Dr. Runell Gess

## 2020-01-27 ENCOUNTER — Encounter: Payer: Self-pay | Admitting: Orthopaedic Surgery

## 2020-01-31 ENCOUNTER — Telehealth: Payer: Self-pay | Admitting: *Deleted

## 2020-01-31 ENCOUNTER — Encounter: Payer: Self-pay | Admitting: Orthopaedic Surgery

## 2020-01-31 NOTE — Telephone Encounter (Signed)
-----   Message from Clydie Braun, [place] sent at 01/24/2020 12:56 PM EST ----- Regarding: FW: Scheduled Peer to Peer See below, he meant he "does not" need to do peer to peer any longer  ----- Message ----- From: Kathryne Hitch, MD Sent: 01/24/2020  12:52 PM EST To: Clydie Braun, RMA Subject: RE: Scheduled Peer to Peer                     I will need to do the peer to peer review now for her low back MRI.  I think is a hip issue and she already got a hip MRI. ----- Message ----- From: Clydie Braun, RMA Sent: 01/24/2020  12:43 PM EST To: Kathryne Hitch, MD Subject: FW: Scheduled Peer to Peer                      ----- Message ----- From: Samuella Cota, CMA Sent: 01/24/2020   9:47 AM EST To: Clydie Braun, RMA Subject: Scheduled Peer to Peer                         I have scheduled a Peer to Peer on this pt for Mon Nov 15 at 1130am with Dr. Eduardo Osier to speak to Dr. Magnus Ivan. Please be sure he has the medical record and service order number when they call. Pt service order # is [id number].   They will be calling the triage line first then Dr. Magnus Ivan cell.   Thanks [place]

## 2020-02-05 ENCOUNTER — Ambulatory Visit: Payer: Managed Care, Other (non HMO) | Admitting: Orthopaedic Surgery

## 2020-02-12 ENCOUNTER — Other Ambulatory Visit: Payer: Self-pay

## 2020-02-12 ENCOUNTER — Ambulatory Visit
Admission: RE | Admit: 2020-02-12 | Discharge: 2020-02-12 | Disposition: A | Discharge status: Home / Self Care | Payer: Managed Care, Other (non HMO) | Source: Ambulatory Visit | Attending: Orthopaedic Surgery | Admitting: Orthopaedic Surgery

## 2020-02-12 DIAGNOSIS — M4807 Spinal stenosis, lumbosacral region: Secondary | ICD-10-CM

## 2020-02-12 MED ORDER — HYDROCODONE-ACETAMINOPHEN 5-325 MG PO TABS
1.0000 | ORAL_TABLET | Freq: Four times a day (QID) | ORAL | 0 refills | Status: DC | PRN
Start: 2020-02-12 — End: 2020-03-21

## 2020-02-12 NOTE — Telephone Encounter (Signed)
Please advise 

## 2020-02-12 NOTE — Telephone Encounter (Signed)
[facility] patient 

## 2020-02-17 ENCOUNTER — Other Ambulatory Visit: Payer: Managed Care, Other (non HMO)

## 2020-02-18 ENCOUNTER — Telehealth: Payer: Self-pay | Admitting: Orthopaedic Surgery

## 2020-02-18 DIAGNOSIS — M4807 Spinal stenosis, lumbosacral region: Secondary | ICD-10-CM

## 2020-02-18 NOTE — Telephone Encounter (Signed)
I do think it is worth her trying I have repeat injection by Dr. Alvester Morin.  He has done this on her right side before from the lumbar spine perspective at L5/S1.  That can be tried again.

## 2020-02-18 NOTE — Telephone Encounter (Signed)
Referral placed in chart. Pt advised

## 2020-02-18 NOTE — Telephone Encounter (Signed)
Patient called asking if Dr. Magnus Ivan can send referral for pt to see Dr. Alvester Morin for back injections. Please call patient at [phone] or [phone].

## 2020-02-18 NOTE — Telephone Encounter (Signed)
Is this ok?

## 2020-02-20 ENCOUNTER — Ambulatory Visit (INDEPENDENT_AMBULATORY_CARE_PROVIDER_SITE_OTHER): Payer: Managed Care, Other (non HMO) | Admitting: Orthopaedic Surgery

## 2020-02-20 ENCOUNTER — Encounter: Payer: Self-pay | Admitting: Orthopaedic Surgery

## 2020-02-20 DIAGNOSIS — T84030D Mechanical loosening of internal right hip prosthetic joint, subsequent encounter: Secondary | ICD-10-CM | POA: Diagnosis not present

## 2020-02-20 DIAGNOSIS — Z96641 Presence of right artificial hip joint: Secondary | ICD-10-CM

## 2020-02-20 NOTE — Progress Notes (Signed)
Patient continues to have significant low back pain as well as right hip pain.  She has a history of a right total hip arthroplasty done through a direct anterior approach.  There is been radiographic evidence of loosening of the femoral component.  Most of her pain seem to be on the lateral aspect of the hip and she is very sensitive to this area.  She cannot sleep on that side at night.  There is no groin pain.  Lidoderm patches have helped some.  I did perform a trochanteric bursectomy earlier this year on that right hip and that did not really help much at all.  Her previous MRI did not show any evidence of prosthetic loosening.  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  She is scheduled to have a injection in her lumbar spine seen by Dr. Alvester Morin.  She has some degenerative changes at L5-S1 on a recent MRI but no nerve compression.  Injections in her back and helped some in the past by Dr. Alvester Morin so she is requesting this again appropriately.  She is someone with a BMI of over 40.  On my exam today I can easily put her right hip through internal and external rotation.  She is sensitive to touch still over the trochanteric area of her hip.  She does report some deep thigh pain as well as right-sided low back pain.  At this point we have exhausted all conservative efforts with her right hip.  Given the more recent MRI findings of potential prosthetic loosening of the femoral component combined with her continued pain and the failure of other efforts, I am recommending an exploration of this right hip with revising loose components as necessary.  This would likely involve a revision of the femoral component.  She understands this will be quite difficult undertaking given her obesity and that we will do everything we can to hopefully minimize her pain and discomfort with the idea that this could be a big undertaking.  There has been no  evidence of infection at all.  She agrees at this point with proceeding with surgery given her continued pain.  We will work on getting the schedule.

## 2020-02-27 ENCOUNTER — Encounter: Payer: Self-pay | Admitting: Orthopaedic Surgery

## 2020-03-05 ENCOUNTER — Encounter: Payer: Self-pay | Admitting: Orthopaedic Surgery

## 2020-03-06 ENCOUNTER — Other Ambulatory Visit: Payer: Self-pay | Admitting: Orthopaedic Surgery

## 2020-03-10 ENCOUNTER — Other Ambulatory Visit: Payer: Self-pay | Admitting: Physician Assistant

## 2020-03-21 ENCOUNTER — Encounter: Payer: Self-pay | Admitting: Orthopaedic Surgery

## 2020-03-21 ENCOUNTER — Other Ambulatory Visit: Payer: Self-pay | Admitting: Orthopaedic Surgery

## 2020-03-21 MED ORDER — HYDROCODONE-ACETAMINOPHEN 5-325 MG PO TABS: 1.0000 | ORAL_TABLET | Freq: Four times a day (QID) | ORAL | 0 refills | Status: DC | PRN

## 2020-03-28 ENCOUNTER — Other Ambulatory Visit: Payer: Managed Care, Other (non HMO)

## 2020-03-28 DIAGNOSIS — Z20822 Contact with and (suspected) exposure to covid-19: Secondary | ICD-10-CM

## 2020-03-29 LAB — SARS-COV-2, NAA 2 DAY TAT

## 2020-03-29 LAB — NOVEL CORONAVIRUS, NAA: SARS-CoV-2, NAA: NOT DETECTED

## 2020-04-07 ENCOUNTER — Other Ambulatory Visit: Payer: Self-pay | Admitting: Orthopaedic Surgery

## 2020-04-07 ENCOUNTER — Encounter: Payer: Self-pay | Admitting: Orthopaedic Surgery

## 2020-04-07 MED ORDER — HYDROCODONE-ACETAMINOPHEN 7.5-325 MG PO TABS
1.0000 | ORAL_TABLET | Freq: Four times a day (QID) | ORAL | 0 refills | Status: DC | PRN
Start: 2020-04-07 — End: 2020-05-05

## 2020-04-11 ENCOUNTER — Other Ambulatory Visit: Payer: Managed Care, Other (non HMO)

## 2020-04-11 DIAGNOSIS — Z20822 Contact with and (suspected) exposure to covid-19: Secondary | ICD-10-CM

## 2020-04-12 LAB — NOVEL CORONAVIRUS, NAA: SARS-CoV-2, NAA: NOT DETECTED

## 2020-04-12 LAB — SARS-COV-2, NAA 2 DAY TAT

## 2020-04-26 ENCOUNTER — Other Ambulatory Visit: Payer: Self-pay | Admitting: Orthopaedic Surgery

## 2020-04-29 ENCOUNTER — Telehealth: Payer: Self-pay | Admitting: Orthopaedic Surgery

## 2020-04-29 NOTE — Telephone Encounter (Signed)
Patient submitted medical release form, [employer] short term disability, and no payment to [employer]. Patient states that [employer] pays on her behalf. I explained to patient paperwork will not be processed without payment and to contact [employer].

## 2020-05-01 ENCOUNTER — Other Ambulatory Visit: Payer: Managed Care, Other (non HMO)

## 2020-05-01 ENCOUNTER — Other Ambulatory Visit: Payer: Self-pay

## 2020-05-01 DIAGNOSIS — Z20822 Contact with and (suspected) exposure to covid-19: Secondary | ICD-10-CM

## 2020-05-02 LAB — SARS-COV-2, NAA 2 DAY TAT

## 2020-05-02 LAB — NOVEL CORONAVIRUS, NAA: SARS-CoV-2, NAA: NOT DETECTED

## 2020-05-05 ENCOUNTER — Other Ambulatory Visit: Payer: Self-pay | Admitting: Orthopaedic Surgery

## 2020-05-05 MED ORDER — HYDROCODONE-ACETAMINOPHEN 7.5-325 MG PO TABS: 1.0000 | ORAL_TABLET | Freq: Four times a day (QID) | ORAL | 0 refills | Status: DC | PRN

## 2020-05-05 NOTE — Telephone Encounter (Signed)
Please advise 

## 2020-05-06 ENCOUNTER — Other Ambulatory Visit: Payer: Self-pay | Admitting: Physician Assistant

## 2020-05-13 ENCOUNTER — Other Ambulatory Visit: Payer: Self-pay | Admitting: Family Medicine

## 2020-05-13 DIAGNOSIS — E041 Nontoxic single thyroid nodule: Secondary | ICD-10-CM

## 2020-05-16 ENCOUNTER — Encounter: Payer: Self-pay | Admitting: Orthopaedic Surgery

## 2020-05-17 ENCOUNTER — Other Ambulatory Visit (HOSPITAL_COMMUNITY)
Admission: RE | Admit: 2020-05-17 | Discharge: 2020-05-17 | Disposition: A | Discharge status: Home / Self Care | Payer: Managed Care, Other (non HMO) | Source: Ambulatory Visit | Attending: Orthopaedic Surgery | Admitting: Orthopaedic Surgery

## 2020-05-17 DIAGNOSIS — Z20822 Contact with and (suspected) exposure to covid-19: Secondary | ICD-10-CM | POA: Insufficient documentation

## 2020-05-17 DIAGNOSIS — Z01812 Encounter for preprocedural laboratory examination: Secondary | ICD-10-CM | POA: Insufficient documentation

## 2020-05-17 LAB — SARS CORONAVIRUS 2 (TAT 6-24 HRS): SARS Coronavirus 2: NEGATIVE

## 2020-05-19 ENCOUNTER — Other Ambulatory Visit: Payer: Self-pay

## 2020-05-19 ENCOUNTER — Encounter (HOSPITAL_COMMUNITY): Payer: Self-pay | Admitting: Orthopaedic Surgery

## 2020-05-19 NOTE — Progress Notes (Signed)
Mrs. Mallet denies chest pain or shortness of breath. Patient was tested negative for Covid  On 05/17/20 and has been in quarantine since that time.  Mr. Quiros has type II diabetes, patient reports that CBG runs around 120, but higher the part two days- 135.  I instructed Mrs. Hacking to not take Steglatro today or in am and do not take Metformin am. DO not take Slegatros in am. I instructed patient to check CBG after awaking and every 2 hours until arrival  to the hospital.  I Instructed patient if CBG is less than 70 to drink1/2 cup of a clear juice. Recheck CBG in 15 minutes then call pre- op desk at [phone] for further instructions.

## 2020-05-20 ENCOUNTER — Encounter (HOSPITAL_COMMUNITY)
Admission: RE | Disposition: A | Discharge status: Home / Self Care | Payer: Self-pay | Source: Home / Self Care | Attending: Orthopaedic Surgery

## 2020-05-20 ENCOUNTER — Inpatient Hospital Stay (HOSPITAL_COMMUNITY): Payer: Managed Care, Other (non HMO)

## 2020-05-20 ENCOUNTER — Inpatient Hospital Stay (HOSPITAL_COMMUNITY): Payer: Managed Care, Other (non HMO) | Admitting: Anesthesiology

## 2020-05-20 ENCOUNTER — Other Ambulatory Visit: Payer: Self-pay

## 2020-05-20 ENCOUNTER — Encounter (HOSPITAL_COMMUNITY): Payer: Self-pay | Admitting: Orthopaedic Surgery

## 2020-05-20 ENCOUNTER — Inpatient Hospital Stay (HOSPITAL_COMMUNITY)
Admission: RE | Admit: 2020-05-20 | Discharge: 2020-05-24 | DRG: 467 | Disposition: A | Discharge status: Home / Self Care | Payer: Managed Care, Other (non HMO) | Attending: Orthopaedic Surgery | Admitting: Orthopaedic Surgery

## 2020-05-20 DIAGNOSIS — Z880 Allergy status to penicillin: Secondary | ICD-10-CM | POA: Diagnosis not present

## 2020-05-20 DIAGNOSIS — Y793 Surgical instruments, materials and orthopedic devices (including sutures) associated with adverse incidents: Secondary | ICD-10-CM | POA: Diagnosis present

## 2020-05-20 DIAGNOSIS — Z419 Encounter for procedure for purposes other than remedying health state, unspecified: Secondary | ICD-10-CM

## 2020-05-20 DIAGNOSIS — Z8249 Family history of ischemic heart disease and other diseases of the circulatory system: Secondary | ICD-10-CM

## 2020-05-20 DIAGNOSIS — I10 Essential (primary) hypertension: Secondary | ICD-10-CM | POA: Diagnosis present

## 2020-05-20 DIAGNOSIS — K219 Gastro-esophageal reflux disease without esophagitis: Secondary | ICD-10-CM | POA: Diagnosis present

## 2020-05-20 DIAGNOSIS — Z6841 Body Mass Index (BMI) 40.0 and over, adult: Secondary | ICD-10-CM

## 2020-05-20 DIAGNOSIS — Z7984 Long term (current) use of oral hypoglycemic drugs: Secondary | ICD-10-CM | POA: Diagnosis not present

## 2020-05-20 DIAGNOSIS — Z20822 Contact with and (suspected) exposure to covid-19: Secondary | ICD-10-CM | POA: Diagnosis present

## 2020-05-20 DIAGNOSIS — Z888 Allergy status to other drugs, medicaments and biological substances status: Secondary | ICD-10-CM

## 2020-05-20 DIAGNOSIS — Z79899 Other long term (current) drug therapy: Secondary | ICD-10-CM

## 2020-05-20 DIAGNOSIS — T8484XA Pain due to internal orthopedic prosthetic devices, implants and grafts, initial encounter: Principal | ICD-10-CM | POA: Diagnosis present

## 2020-05-20 DIAGNOSIS — Z975 Presence of (intrauterine) contraceptive device: Secondary | ICD-10-CM

## 2020-05-20 DIAGNOSIS — T84038A Mechanical loosening of other internal prosthetic joint, initial encounter: Secondary | ICD-10-CM

## 2020-05-20 DIAGNOSIS — Z881 Allergy status to other antibiotic agents status: Secondary | ICD-10-CM

## 2020-05-20 DIAGNOSIS — E119 Type 2 diabetes mellitus without complications: Secondary | ICD-10-CM | POA: Diagnosis present

## 2020-05-20 DIAGNOSIS — Z825 Family history of asthma and other chronic lower respiratory diseases: Secondary | ICD-10-CM

## 2020-05-20 DIAGNOSIS — Z833 Family history of diabetes mellitus: Secondary | ICD-10-CM

## 2020-05-20 DIAGNOSIS — D62 Acute posthemorrhagic anemia: Secondary | ICD-10-CM | POA: Diagnosis not present

## 2020-05-20 DIAGNOSIS — Z96641 Presence of right artificial hip joint: Secondary | ICD-10-CM | POA: Diagnosis not present

## 2020-05-20 DIAGNOSIS — T84090A Other mechanical complication of internal right hip prosthesis, initial encounter: Secondary | ICD-10-CM | POA: Diagnosis not present

## 2020-05-20 DIAGNOSIS — Z96649 Presence of unspecified artificial hip joint: Secondary | ICD-10-CM

## 2020-05-20 HISTORY — DX: Pneumonia, unspecified organism: J18.9

## 2020-05-20 HISTORY — DX: Gastro-esophageal reflux disease without esophagitis: K21.9

## 2020-05-20 HISTORY — PX: ANTERIOR HIP REVISION: SHX6527

## 2020-05-20 LAB — BASIC METABOLIC PANEL
Anion gap: 13 (ref 5–15)
BUN: 9 mg/dL (ref 6–20)
CO2: 23 mmol/L (ref 22–32)
Calcium: 9.3 mg/dL (ref 8.9–10.3)
Chloride: 100 mmol/L (ref 98–111)
Creatinine, Ser: 0.72 mg/dL (ref 0.44–1.00)
GFR, Estimated: >60 mL/min (ref >60)
Glucose, Bld: 108 mg/dL — ABNORMAL HIGH (ref 70–99)
Potassium: 4 mmol/L (ref 3.5–5.1)
Sodium: 136 mmol/L (ref 135–145)

## 2020-05-20 LAB — CBC
HCT: 42.6 % (ref 36.0–46.0)
Hemoglobin: 13.6 g/dL (ref 12.0–15.0)
MCH: 26.6 pg (ref 26.0–34.0)
MCHC: 31.9 g/dL (ref 30.0–36.0)
MCV: 83.4 fL (ref 80.0–100.0)
Platelets: 298 10*3/uL (ref 150–400)
RBC: 5.11 MIL/uL (ref 3.87–5.11)
RDW: 16.3 % — ABNORMAL HIGH (ref 11.5–15.5)
WBC: 9.3 10*3/uL (ref 4.0–10.5)
nRBC: 0 % (ref 0.0–0.2)

## 2020-05-20 LAB — TYPE AND SCREEN
ABO/RH(D): O POS
Antibody Screen: NEGATIVE

## 2020-05-20 LAB — GLUCOSE, CAPILLARY
Glucose-Capillary: 128 mg/dL — ABNORMAL HIGH (ref 70–99)
Glucose-Capillary: 118 mg/dL — ABNORMAL HIGH (ref 70–99)
Glucose-Capillary: 119 mg/dL — ABNORMAL HIGH (ref 70–99)

## 2020-05-20 LAB — POCT PREGNANCY, URINE: Preg Test, Ur: NEGATIVE

## 2020-05-20 SURGERY — REVISION, TOTAL ARTHROPLASTY, HIP, ANTERIOR APPROACH
Anesthesia: Spinal | Site: Hip | Laterality: Right

## 2020-05-20 MED ORDER — MENTHOL 3 MG MT LOZG: 1.0000 | LOZENGE | OROMUCOSAL | Status: DC | PRN

## 2020-05-20 MED ORDER — ZOLPIDEM TARTRATE 5 MG PO TABS: 5.0000 mg | ORAL_TABLET | Freq: Every evening | ORAL | Status: DC | PRN

## 2020-05-20 MED ORDER — MIDAZOLAM HCL 2 MG/2ML IJ SOLN
INTRAMUSCULAR | Status: AC
  Filled 2020-05-20: qty 2

## 2020-05-20 MED ORDER — ROSUVASTATIN CALCIUM 5 MG PO TABS
10.0000 mg | ORAL_TABLET | Freq: Every day | ORAL | Status: DC
  Administered 2020-05-21 – 2020-05-24 (×4): 10 mg via ORAL
  Filled 2020-05-20 (×4): qty 2

## 2020-05-20 MED ORDER — PROPOFOL 10 MG/ML IV BOLUS
INTRAVENOUS | Status: DC | PRN
  Administered 2020-05-20: 20 mg via INTRAVENOUS

## 2020-05-20 MED ORDER — MONTELUKAST SODIUM 10 MG PO TABS
10.0000 mg | ORAL_TABLET | Freq: Every day | ORAL | Status: DC
  Administered 2020-05-21 – 2020-05-23 (×3): 10 mg via ORAL
  Filled 2020-05-20 (×3): qty 1

## 2020-05-20 MED ORDER — POVIDONE-IODINE 10 % EX SWAB
2.0000 "application " | Freq: Once | CUTANEOUS | Status: AC
  Administered 2020-05-20: 2 via TOPICAL

## 2020-05-20 MED ORDER — ORAL CARE MOUTH RINSE: 15.0000 mL | Freq: Once | OROMUCOSAL | Status: AC

## 2020-05-20 MED ORDER — METHOCARBAMOL 500 MG PO TABS
500.0000 mg | ORAL_TABLET | Freq: Four times a day (QID) | ORAL | Status: DC | PRN
  Administered 2020-05-21 – 2020-05-24 (×5): 500 mg via ORAL
  Filled 2020-05-20 (×5): qty 1

## 2020-05-20 MED ORDER — DEXTROSE 5 % IV SOLN
INTRAVENOUS | Status: DC | PRN
  Administered 2020-05-20: 3 g via INTRAVENOUS

## 2020-05-20 MED ORDER — PROPOFOL 500 MG/50ML IV EMUL
INTRAVENOUS | Status: DC | PRN
  Administered 2020-05-20: 75 ug/kg/min via INTRAVENOUS

## 2020-05-20 MED ORDER — OXYCODONE HCL 5 MG/5ML PO SOLN: 5.0000 mg | Freq: Once | ORAL | Status: DC | PRN

## 2020-05-20 MED ORDER — ALBUTEROL SULFATE (2.5 MG/3ML) 0.083% IN NEBU: 2.5000 mg | INHALATION_SOLUTION | RESPIRATORY_TRACT | Status: DC | PRN

## 2020-05-20 MED ORDER — FENTANYL CITRATE (PF) 250 MCG/5ML IJ SOLN
INTRAMUSCULAR | Status: DC | PRN
  Administered 2020-05-20: 50 ug via INTRAVENOUS
  Administered 2020-05-20: 100 ug via INTRAVENOUS

## 2020-05-20 MED ORDER — ALBUTEROL SULFATE HFA 108 (90 BASE) MCG/ACT IN AERS: 2.0000 | INHALATION_SPRAY | RESPIRATORY_TRACT | Status: DC | PRN

## 2020-05-20 MED ORDER — ACETAMINOPHEN 500 MG PO TABS: 1000.0000 mg | ORAL_TABLET | Freq: Once | ORAL | Status: DC

## 2020-05-20 MED ORDER — ROCURONIUM BROMIDE 10 MG/ML (PF) SYRINGE
PREFILLED_SYRINGE | INTRAVENOUS | Status: AC
  Filled 2020-05-20: qty 10

## 2020-05-20 MED ORDER — OXYCODONE HCL 5 MG PO TABS: 5.0000 mg | ORAL_TABLET | ORAL | Status: DC | PRN

## 2020-05-20 MED ORDER — METHOCARBAMOL 1000 MG/10ML IJ SOLN
500.0000 mg | Freq: Four times a day (QID) | INTRAVENOUS | Status: DC | PRN
  Filled 2020-05-20: qty 5

## 2020-05-20 MED ORDER — BUPIVACAINE IN DEXTROSE 0.75-8.25 % IT SOLN
INTRATHECAL | Status: DC | PRN
  Administered 2020-05-20: 2 mL via INTRATHECAL

## 2020-05-20 MED ORDER — 0.9 % SODIUM CHLORIDE (POUR BTL) OPTIME
TOPICAL | Status: DC | PRN
  Administered 2020-05-20: 1000 mL

## 2020-05-20 MED ORDER — MOMETASONE FURO-FORMOTEROL FUM 200-5 MCG/ACT IN AERO
2.0000 | INHALATION_SPRAY | Freq: Two times a day (BID) | RESPIRATORY_TRACT | Status: DC
  Administered 2020-05-21 – 2020-05-24 (×7): 2 via RESPIRATORY_TRACT
  Filled 2020-05-20: qty 8.8

## 2020-05-20 MED ORDER — PANTOPRAZOLE SODIUM 40 MG PO TBEC
40.0000 mg | DELAYED_RELEASE_TABLET | Freq: Every day | ORAL | Status: DC
  Administered 2020-05-20 – 2020-05-24 (×5): 40 mg via ORAL
  Filled 2020-05-20 (×5): qty 1

## 2020-05-20 MED ORDER — PHENYLEPHRINE HCL-NACL 10-0.9 MG/250ML-% IV SOLN
0.0000 ug/min | INTRAVENOUS | Status: DC
  Filled 2020-05-20 (×2): qty 250

## 2020-05-20 MED ORDER — PHENYLEPHRINE HCL-NACL 10-0.9 MG/250ML-% IV SOLN
INTRAVENOUS | Status: DC | PRN
  Administered 2020-05-20 (×2): 75 ug/min via INTRAVENOUS

## 2020-05-20 MED ORDER — SODIUM CHLORIDE 0.9 % IV SOLN: INTRAVENOUS | Status: DC

## 2020-05-20 MED ORDER — ALUM & MAG HYDROXIDE-SIMETH 200-200-20 MG/5ML PO SUSP: 30.0000 mL | ORAL | Status: DC | PRN

## 2020-05-20 MED ORDER — VITAMIN D 25 MCG (1000 UNIT) PO TABS
2000.0000 [IU] | ORAL_TABLET | Freq: Every day | ORAL | Status: DC
  Administered 2020-05-20 – 2020-05-24 (×5): 2000 [IU] via ORAL
  Filled 2020-05-20 (×5): qty 2

## 2020-05-20 MED ORDER — HYDROMORPHONE HCL 1 MG/ML IJ SOLN
INTRAMUSCULAR | Status: AC
  Filled 2020-05-20: qty 0.5

## 2020-05-20 MED ORDER — LOSARTAN POTASSIUM 50 MG PO TABS
25.0000 mg | ORAL_TABLET | Freq: Every day | ORAL | Status: DC
  Administered 2020-05-21 – 2020-05-24 (×4): 25 mg via ORAL
  Filled 2020-05-20 (×4): qty 1

## 2020-05-20 MED ORDER — DOCUSATE SODIUM 100 MG PO CAPS
100.0000 mg | ORAL_CAPSULE | Freq: Two times a day (BID) | ORAL | Status: DC
  Administered 2020-05-20 – 2020-05-24 (×8): 100 mg via ORAL
  Filled 2020-05-20 (×8): qty 1

## 2020-05-20 MED ORDER — ACETAMINOPHEN 325 MG PO TABS
325.0000 mg | ORAL_TABLET | Freq: Four times a day (QID) | ORAL | Status: DC | PRN
  Administered 2020-05-21 – 2020-05-24 (×5): 650 mg via ORAL
  Filled 2020-05-20 (×5): qty 2

## 2020-05-20 MED ORDER — OXYCODONE HCL 5 MG PO TABS
10.0000 mg | ORAL_TABLET | ORAL | Status: DC | PRN
  Administered 2020-05-20 – 2020-05-24 (×14): 15 mg via ORAL
  Filled 2020-05-20 (×14): qty 3

## 2020-05-20 MED ORDER — OXYCODONE HCL 5 MG PO TABS: 5.0000 mg | ORAL_TABLET | Freq: Once | ORAL | Status: DC | PRN

## 2020-05-20 MED ORDER — AMLODIPINE BESYLATE 10 MG PO TABS
10.0000 mg | ORAL_TABLET | Freq: Every morning | ORAL | Status: DC
  Administered 2020-05-21 – 2020-05-24 (×4): 10 mg via ORAL
  Filled 2020-05-20 (×2): qty 1

## 2020-05-20 MED ORDER — ONDANSETRON HCL 4 MG/2ML IJ SOLN
INTRAMUSCULAR | Status: AC
  Filled 2020-05-20: qty 2

## 2020-05-20 MED ORDER — CHLORHEXIDINE GLUCONATE 0.12 % MT SOLN
15.0000 mL | Freq: Once | OROMUCOSAL | Status: AC
  Administered 2020-05-20: 15 mL via OROMUCOSAL
  Filled 2020-05-20: qty 15

## 2020-05-20 MED ORDER — LACTATED RINGERS IV SOLN: INTRAVENOUS | Status: DC

## 2020-05-20 MED ORDER — POLYETHYLENE GLYCOL 3350 17 G PO PACK: 17.0000 g | PACK | Freq: Every day | ORAL | Status: DC | PRN

## 2020-05-20 MED ORDER — METOCLOPRAMIDE HCL 5 MG PO TABS: 5.0000 mg | ORAL_TABLET | Freq: Three times a day (TID) | ORAL | Status: DC | PRN

## 2020-05-20 MED ORDER — METOCLOPRAMIDE HCL 5 MG/ML IJ SOLN
5.0000 mg | Freq: Three times a day (TID) | INTRAMUSCULAR | Status: DC | PRN
  Administered 2020-05-22 – 2020-05-23 (×2): 10 mg via INTRAVENOUS
  Filled 2020-05-20 (×2): qty 2

## 2020-05-20 MED ORDER — FENTANYL CITRATE (PF) 100 MCG/2ML IJ SOLN
INTRAMUSCULAR | Status: AC
  Administered 2020-05-20: 50 ug via INTRAVENOUS
  Filled 2020-05-20: qty 2

## 2020-05-20 MED ORDER — FENTANYL CITRATE (PF) 250 MCG/5ML IJ SOLN
INTRAMUSCULAR | Status: AC
  Filled 2020-05-20: qty 5

## 2020-05-20 MED ORDER — LIDOCAINE 2% (20 MG/ML) 5 ML SYRINGE
INTRAMUSCULAR | Status: AC
  Filled 2020-05-20: qty 5

## 2020-05-20 MED ORDER — METFORMIN HCL ER 750 MG PO TB24
750.0000 mg | ORAL_TABLET | Freq: Two times a day (BID) | ORAL | Status: DC
  Administered 2020-05-20 – 2020-05-24 (×8): 750 mg via ORAL
  Filled 2020-05-20 (×9): qty 1

## 2020-05-20 MED ORDER — VANCOMYCIN HCL 1000 MG/200ML IV SOLN
1000.0000 mg | Freq: Two times a day (BID) | INTRAVENOUS | Status: AC
  Administered 2020-05-20: 1000 mg via INTRAVENOUS
  Filled 2020-05-20: qty 200

## 2020-05-20 MED ORDER — SODIUM CHLORIDE 0.9 % IR SOLN
Status: DC | PRN
  Administered 2020-05-20: 3000 mL

## 2020-05-20 MED ORDER — DIPHENHYDRAMINE HCL 12.5 MG/5ML PO ELIX: 12.5000 mg | ORAL_SOLUTION | ORAL | Status: DC | PRN

## 2020-05-20 MED ORDER — HYDROMORPHONE HCL 1 MG/ML IJ SOLN
INTRAMUSCULAR | Status: DC | PRN
  Administered 2020-05-20: .5 mg via INTRAVENOUS

## 2020-05-20 MED ORDER — PROMETHAZINE HCL 25 MG/ML IJ SOLN: 6.2500 mg | INTRAMUSCULAR | Status: DC | PRN

## 2020-05-20 MED ORDER — PHENOL 1.4 % MT LIQD: 1.0000 | OROMUCOSAL | Status: DC | PRN

## 2020-05-20 MED ORDER — CEFAZOLIN SODIUM-DEXTROSE 1-4 GM/50ML-% IV SOLN
INTRAVENOUS | Status: AC
  Filled 2020-05-20: qty 50

## 2020-05-20 MED ORDER — ONDANSETRON HCL 4 MG PO TABS
4.0000 mg | ORAL_TABLET | Freq: Four times a day (QID) | ORAL | Status: DC | PRN
  Administered 2020-05-21: 4 mg via ORAL
  Filled 2020-05-20: qty 1

## 2020-05-20 MED ORDER — METHOCARBAMOL 500 MG PO TABS
ORAL_TABLET | ORAL | Status: AC
  Administered 2020-05-20: 500 mg via ORAL
  Filled 2020-05-20: qty 1

## 2020-05-20 MED ORDER — ONDANSETRON HCL 4 MG/2ML IJ SOLN
4.0000 mg | Freq: Four times a day (QID) | INTRAMUSCULAR | Status: DC | PRN
  Administered 2020-05-21 – 2020-05-24 (×8): 4 mg via INTRAVENOUS
  Filled 2020-05-20 (×8): qty 2

## 2020-05-20 MED ORDER — FENTANYL CITRATE (PF) 100 MCG/2ML IJ SOLN
25.0000 ug | INTRAMUSCULAR | Status: DC | PRN
  Administered 2020-05-20: 50 ug via INTRAVENOUS

## 2020-05-20 MED ORDER — HYDROMORPHONE HCL 1 MG/ML IJ SOLN
0.5000 mg | INTRAMUSCULAR | Status: DC | PRN
  Administered 2020-05-20 – 2020-05-24 (×15): 1 mg via INTRAVENOUS
  Filled 2020-05-20 (×15): qty 1

## 2020-05-20 MED ORDER — MIDAZOLAM HCL 2 MG/2ML IJ SOLN
INTRAMUSCULAR | Status: DC | PRN
  Administered 2020-05-20: 2 mg via INTRAVENOUS

## 2020-05-20 MED ORDER — EMPAGLIFLOZIN 25 MG PO TABS
25.0000 mg | ORAL_TABLET | Freq: Every day | ORAL | Status: DC
  Administered 2020-05-22 – 2020-05-24 (×3): 25 mg via ORAL
  Filled 2020-05-20 (×6): qty 1

## 2020-05-20 MED ORDER — PROPOFOL 10 MG/ML IV BOLUS
INTRAVENOUS | Status: AC
  Filled 2020-05-20: qty 40

## 2020-05-20 MED ORDER — CEFAZOLIN SODIUM-DEXTROSE 2-4 GM/100ML-% IV SOLN
INTRAVENOUS | Status: AC
  Filled 2020-05-20: qty 100

## 2020-05-20 MED ORDER — ASPIRIN 81 MG PO CHEW
81.0000 mg | CHEWABLE_TABLET | Freq: Two times a day (BID) | ORAL | Status: DC
  Administered 2020-05-20 – 2020-05-24 (×8): 81 mg via ORAL
  Filled 2020-05-20 (×8): qty 1

## 2020-05-20 SURGICAL SUPPLY — 56 items
BLADE CLIPPER SURG (BLADE) IMPLANT
BONE CANC CHIPS 20CC PCAN1/4 (Bone Implant) ×2 IMPLANT
BRUSH FEMORAL CANAL (MISCELLANEOUS) IMPLANT
CHIPS CANC BONE 20CC PCAN1/4 (Bone Implant) ×1 IMPLANT
COVER SURGICAL LIGHT HANDLE (MISCELLANEOUS) ×2 IMPLANT
COVER WAND RF STERILE (DRAPES) ×2 IMPLANT
DRAPE HIP W/POCKET STRL (MISCELLANEOUS) ×2 IMPLANT
DRAPE INCISE IOBAN 66X45 STRL (DRAPES) IMPLANT
DRAPE U-SHAPE 47X51 STRL (DRAPES) ×2 IMPLANT
DRSG AQUACEL AG ADV 3.5X10 (GAUZE/BANDAGES/DRESSINGS) ×1 IMPLANT
DRSG AQUACEL AG ADV 3.5X14 (GAUZE/BANDAGES/DRESSINGS) IMPLANT
DURAPREP 26ML APPLICATOR (WOUND CARE) ×2 IMPLANT
ELECT BLADE 6.5 EXT (BLADE) IMPLANT
ELECT CAUTERY BLADE 6.4 (BLADE) ×2 IMPLANT
ELECT REM PT RETURN 9FT ADLT (ELECTROSURGICAL) ×2
ELECTRODE REM PT RTRN 9FT ADLT (ELECTROSURGICAL) ×1 IMPLANT
EVACUATOR 1/8 PVC DRAIN (DRAIN) IMPLANT
FACESHIELD WRAPAROUND (MASK) ×4 IMPLANT
FACESHIELD WRAPAROUND OR TEAM (MASK) ×2 IMPLANT
GLOVE BIO SURGEON STRL SZ8 (GLOVE) ×2 IMPLANT
GLOVE ORTHO TXT STRL SZ7.5 (GLOVE) ×4 IMPLANT
GLOVE SRG 8 PF TXTR STRL LF DI (GLOVE) ×1 IMPLANT
GLOVE SURG UNDER POLY LF SZ8 (GLOVE) ×2
GOWN STRL REUS W/ TWL LRG LVL3 (GOWN DISPOSABLE) ×2 IMPLANT
GOWN STRL REUS W/ TWL XL LVL3 (GOWN DISPOSABLE) ×4 IMPLANT
GOWN STRL REUS W/TWL LRG LVL3 (GOWN DISPOSABLE) ×4
GOWN STRL REUS W/TWL XL LVL3 (GOWN DISPOSABLE) ×8
GRAFT BNE CANC CHIPS 1-8 20CC (Bone Implant) IMPLANT
HANDPIECE INTERPULSE COAX TIP (DISPOSABLE) ×2
HEAD FEM STD 32X+9 STRL (Hips) ×1 IMPLANT
KIT BASIN OR (CUSTOM PROCEDURE TRAY) ×2 IMPLANT
KIT TURNOVER KIT B (KITS) ×2 IMPLANT
LINER ACET PNNCL PLUS4 NEUTRAL (Hips) IMPLANT
MANIFOLD NEPTUNE II (INSTRUMENTS) ×2 IMPLANT
NS IRRIG 1000ML POUR BTL (IV SOLUTION) ×2 IMPLANT
PACK TOTAL JOINT (CUSTOM PROCEDURE TRAY) ×2 IMPLANT
PAD ARMBOARD 7.5X6 YLW CONV (MISCELLANEOUS) ×4 IMPLANT
PASSER SUT SWANSON 36MM LOOP (INSTRUMENTS) IMPLANT
PINNACLE PLUS 4 NEUTRAL (Hips) ×2 IMPLANT
PUTTY BONE DBX 2.5 MIS (Bone Implant) ×1 IMPLANT
SET HNDPC FAN SPRY TIP SCT (DISPOSABLE) ×1 IMPLANT
SPONGE LAP 18X18 RF (DISPOSABLE) IMPLANT
STAPLER VISISTAT 35W (STAPLE) IMPLANT
SUCTION FRAZIER HANDLE 10FR (MISCELLANEOUS) ×2
SUCTION TUBE FRAZIER 10FR DISP (MISCELLANEOUS) ×1 IMPLANT
SUT ETHIBOND NAB CT1 #1 30IN (SUTURE) ×2 IMPLANT
SUT VIC AB 1 CT1 27 (SUTURE) ×4
SUT VIC AB 1 CT1 27XBRD ANTBC (SUTURE) ×2 IMPLANT
SUT VIC AB 2-0 CT1 27 (SUTURE) ×4
SUT VIC AB 2-0 CT1 TAPERPNT 27 (SUTURE) ×2 IMPLANT
TOWEL GREEN STERILE (TOWEL DISPOSABLE) ×2 IMPLANT
TOWEL GREEN STERILE FF (TOWEL DISPOSABLE) ×2 IMPLANT
TOWER CARTRIDGE SMART MIX (DISPOSABLE) IMPLANT
TRAY FOL W/BAG SLVR 16FR STRL (SET/KITS/TRAYS/PACK) IMPLANT
TRAY FOLEY W/BAG SLVR 16FR LF (SET/KITS/TRAYS/PACK)
WATER STERILE IRR 1000ML POUR (IV SOLUTION) ×6 IMPLANT

## 2020-05-20 NOTE — Transfer of Care (Signed)
Immediate Anesthesia Transfer of Care Note  Patient: Claudina Lick [place]  Procedure(s) Performed: RIGHT TOTAL HIP REVISION- ploy exhange and hip ball exchange with bone graft (Right Hip)  Patient Location: PACU  Anesthesia Type:Spinal  Level of Consciousness: awake and patient cooperative  Airway & Oxygen Therapy: Patient Spontanous Breathing and Patient connected to face mask oxygen  Post-op Assessment: Report given to RN and Post -op Vital signs reviewed and stable  Post vital signs: Reviewed and stable  Last Vitals:  Vitals Value Taken Time  BP 94/67 05/20/20 1745  Temp 36 C 05/20/20 1742  Pulse 74 05/20/20 1747  Resp 19 05/20/20 1747  SpO2 100 % 05/20/20 1747  Vitals shown include unvalidated device data.  Last Pain:  Vitals:   05/20/20 1224  TempSrc:   PainSc: 7       Patients Stated Pain Goal: 2 (05/20/20 1224)  Complications: No complications documented.

## 2020-05-20 NOTE — Anesthesia Preprocedure Evaluation (Addendum)
Anesthesia Evaluation  Patient identified by MRN, date of birth, ID band Patient awake    Reviewed: Allergy & Precautions, NPO status , Patient's Chart, lab work & pertinent test results  History of Anesthesia Complications Negative for: history of anesthetic complications  Airway Mallampati: II  TM Distance: >3 FB Neck ROM: Full    Dental no notable dental hx.    Pulmonary asthma ,    Pulmonary exam normal        Cardiovascular hypertension, Pt. on medications Normal cardiovascular exam     Neuro/Psych  Headaches, negative psych ROS   GI/Hepatic Neg liver ROS, GERD  Medicated and Controlled,  Endo/Other  diabetes, Type 2, Oral Hypoglycemic AgentsMorbid obesity  Renal/GU negative Renal ROS  negative genitourinary   Musculoskeletal  (+) Arthritis ,   Abdominal   Peds  Hematology negative hematology ROS (+)   Anesthesia Other Findings Day of surgery medications reviewed with patient.  Reproductive/Obstetrics negative OB ROS                            Anesthesia Physical Anesthesia Plan  ASA: III  Anesthesia Plan: Spinal   Post-op Pain Management:    Induction:   PONV Risk Score and Plan: 2 and Treatment may vary due to age or medical condition, Propofol infusion, Ondansetron and Midazolam  Airway Management Planned: Natural Airway and Simple Face Mask  Additional Equipment: None  Intra-op Plan:   Post-operative Plan:   Informed Consent: I have reviewed the patients History and Physical, chart, labs and discussed the procedure including the risks, benefits and alternatives for the proposed anesthesia with the patient or authorized representative who has indicated his/her understanding and acceptance.       Plan Discussed with: CRNA  Anesthesia Plan Comments:        Anesthesia Quick Evaluation

## 2020-05-20 NOTE — Brief Op Note (Signed)
05/20/2020  5:03 PM  PATIENT:  Emily Hester  50 y.o. female  PRE-OPERATIVE DIAGNOSIS:  Failed Right Total Hip Arthroplasty  POST-OPERATIVE DIAGNOSIS:  Failed Right Total Hip Arthroplasty  PROCEDURE:  Procedure(s): RIGHT TOTAL HIP REVISION- ploy exhange and hip ball exchange with bone graft (Right)  SURGEON:  Surgeon(s) and Role:    Kathryne Hitch, MD - Primary  PHYSICIAN ASSISTANT:  Rexene Edison, PA-C  ANESTHESIA:   spinal  EBL:  250 mL   COUNTS:  YES  DICTATION: .Other Dictation: Dictation Number [id number]  PLAN OF CARE: Admit to inpatient   PATIENT DISPOSITION:  PACU - hemodynamically stable.   Delay start of Pharmacological VTE agent (>24hrs) due to surgical blood loss or risk of bleeding: no

## 2020-05-20 NOTE — Interval H&P Note (Signed)
History and Physical Interval Note: The patient understands that she is here today for a right hip revision arthroplasty based on radiographic evidence of loosening of the femoral component combined with pain on clinical exam.  There has been no interval change in her medical status.  See recent H&P.  The risks and benefits of surgery been explained in detail and informed consent is obtained.  05/20/2020 1:56 PM  Emily Hester  has presented today for surgery, with the diagnosis of Failed Right Total Hip Arthroplasty.  The various methods of treatment have been discussed with the patient and family. After consideration of risks, benefits and other options for treatment, the patient has consented to  Procedure(s): RIGHT TOTAL HIP REVISION (Right) as a surgical intervention.  The patient's history has been reviewed, patient examined, no change in status, stable for surgery.  I have reviewed the patient's chart and labs.  Questions were answered to the patient's satisfaction.     Kathryne Hitch

## 2020-05-20 NOTE — H&P (Signed)
TOTAL HIP REVISION ADMISSION H&P  Patient is admitted for right revision total hip arthroplasty.  Subjective:  Chief Complaint: right hip pain  HPI: Emily Hester, 50 y.o. female, has a history of pain and functional disability in the right hip due to aseptic loosening of the femoral component and patient has failed non-surgical conservative treatments for greater than 12 weeks to include flexibility and strengthening excercises, supervised PT with diminished ADL's post treatment, use of assistive devices, weight reduction as appropriate and activity modification. The indications for the revision total hip arthroplasty are loosening of one or more components.  Onset of symptoms was gradual starting 2 years ago with gradually worsening course since that time.  Prior procedures on the right hip include arthroplasty.  Patient currently rates pain in the right hip at 10 out of 10 with activity.  There is worsening of pain with activity and weight bearing and pain that interfers with activities of daily living. Patient has evidence of prosthetic loosening by imaging studies.  This condition presents safety issues increasing the risk of falls.  There is no current active infection.  Patient Active Problem List   Diagnosis Date Noted  . 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 Trochanteric bursitis, right hip 09/05/2019  . 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  . Unilateral primary osteoarthritis, right hip 01/27/2017   Past Medical History:  Diagnosis Date  . Arthritis    patient denies  . Asthma   . Diabetes mellitus without complication (HCC)    type   . GERD (gastroesophageal reflux disease)   . Hypertension   . Interstitial cystitis   . IUD (intrauterine device) in place placed 6 weeks ago  . Migraines    MIgraines- not current    . Pneumonia     Past Surgical History:  Procedure Laterality Date  . DILATION AND EVACUATION  09/30/2011   Procedure:  DILATATION AND EVACUATION;  Surgeon: Derrian Andrea, MD;  Location: [facility];  Service: Gynecology;  Laterality: N/A;  . left knee surgery      arthroscopy   . TOTAL HIP ARTHROPLASTY Right 03/11/2017   Procedure: RIGHT TOTAL HIP ARTHROPLASTY ANTERIOR APPROACH;  Surgeon: Kathryne Hitch, MD;  Location: [facility];  Service: Orthopedics;  Laterality: Right;    No current facility-administered medications for this encounter.   Current Outpatient Medications  Medication Sig Dispense Refill Last Dose  . acetaminophen (TYLENOL) 500 MG tablet Take 1,000 mg by mouth every 6 (six) hours as needed for moderate pain.     Marland Kitchen ADVAIR HFA 115-21 MCG/ACT inhaler Inhale 2 puffs into the lungs 2 (two) times daily.     Marland Kitchen albuterol (VENTOLIN HFA) 108 (90 Base) MCG/ACT inhaler Inhale 2 puffs into the lungs every 4 (four) hours as needed for wheezing or shortness of breath. 18 g 1   . amLODipine (NORVASC) 10 MG tablet Take 10 mg by mouth every morning.     Marland Kitchen aspirin-acetaminophen-caffeine (EXCEDRIN MIGRAINE) 250-250-65 MG tablet Take 2 tablets by mouth every 6 (six) hours as needed for headache.     . cetirizine (ZYRTEC) 10 MG tablet Take 1 tablet (10 mg total) by mouth daily. 30 tablet 5   . Cholecalciferol (VITAMIN D) 50 MCG (2000 UT) tablet Take 2,000 Units by mouth daily.     . clindamycin (CLEOCIN) 150 MG capsule Take 600 mg by mouth See admin instructions. Take 600 mg 1 hour prior to dental work     .  diphenhydrAMINE (BENADRYL) 25 MG tablet Take 25 mg by mouth daily as needed for allergies.     . Dulaglutide (TRULICITY) 1.5 MG/0.5ML SOPN Inject 1.5 mg into the skin every Tuesday.     . ertugliflozin L-PyroglutamicAc (STEGLATRO) 15 MG TABS tablet Take 15 mg by mouth daily.     Marland Kitchen HYDROcodone-acetaminophen (NORCO) 7.5-325 MG tablet Take 1-2 tablets by mouth every 6 (six) hours as needed for moderate pain. 30 tablet 0   . ibuprofen (ADVIL) 200 MG tablet Take 600 mg by mouth every 6 (six) hours as needed for  headache or moderate pain.     Marland Kitchen ipratropium (ATROVENT) 0.06 % nasal spray Place 2 sprays into both nostrils 3 (three) times daily. (Patient taking differently: Place 2 sprays into both nostrils 3 (three) times daily as needed for rhinitis.) 15 mL 5   . levonorgestrel (MIRENA) 20 MCG/24HR IUD 1 each by Intrauterine route once.     . lidocaine (LIDODERM) 5 % PLACE 1 PATCH ONTO THE SKIN DAILY. REMOVE & DISCARD PATCH WITHIN 12 HOURS OR AS DIRECTED BY MD (Patient taking differently: Place 1 patch onto the skin daily as needed (pain). Remove & Discard patch within 12 hours or as directed by MD) 30 patch 0   . losartan (COZAAR) 25 MG tablet Take 25 mg by mouth daily.  0   . metFORMIN (GLUCOPHAGE-XR) 750 MG 24 hr tablet Take 750 mg by mouth 2 (two) times daily.     . montelukast (SINGULAIR) 10 MG tablet Take 1 tablet (10 mg total) by mouth at bedtime. 30 tablet 5   . olopatadine (PATANOL) 0.1 % ophthalmic solution Place 1 drop into both eyes daily as needed for allergies. 5 mL 5   . omeprazole (PRILOSEC) 20 MG capsule Take 1 capsule (20 mg total) by mouth every morning. (Patient taking differently: Take 20 mg by mouth daily.) 30 capsule 5   . ondansetron (ZOFRAN) 8 MG tablet Take 8 mg by mouth every 8 (eight) hours as needed for nausea or vomiting.     . rosuvastatin (CRESTOR) 10 MG tablet Take 10 mg by mouth daily.  3   . tiZANidine (ZANAFLEX) 4 MG tablet TAKE 1 TABLET (4 MG TOTAL) BY MOUTH 3 (THREE) TIMES DAILY. (Patient taking differently: Take 4 mg by mouth 3 (three) times daily as needed for muscle spasms.) 60 tablet 0   . Fluticasone-Salmeterol (WIXELA INHUB) 250-50 MCG/DOSE AEPB Inhale 1 puff into the lungs 2 (two) times daily. (Patient not taking: Reported on 05/09/2020) 60 each 5 Not Taking at Unknown time  . ONETOUCH VERIO test strip as directed.   3    Allergies  Allergen Reactions  . Amoxicillin Hives, Swelling and Other (See Comments)    Has patient had a PCN reaction causing immediate rash,  facial/tongue/throat swelling, SOB or lightheadedness with hypotension: Yes Has patient had a PCN reaction causing severe rash involving mucus membranes or skin necrosis: No Has patient had a PCN reaction that required hospitalization: Yes Has patient had a PCN reaction occurring within the last 10 years: No  If all of the above answers are "NO", then may proceed with Cephalosporin use.   Marland Kitchen Lisinopril Itching and Cough    Heart racing   . Olmesartan     jittery  . Bactrim Rash    Social History   Tobacco Use  . Smoking status: Never Smoker  . Smokeless tobacco: Never Used  Substance Use Topics  . Alcohol use: No    Family  History  Problem Relation Age of Onset  . Diabetes Mother   . Heart disease Mother   . Diabetes Father   . Eczema Sister   . Asthma Brother   . Asthma Son   . Eczema Daughter   . Allergic rhinitis Neg Hx   . Angioedema Neg Hx   . Immunodeficiency Neg Hx   . Urticaria Neg Hx       Review of Systems  Musculoskeletal: Positive for back pain.  All other systems reviewed and are negative.   Objective:  Physical Exam Vitals reviewed.  Constitutional:      Appearance: Normal appearance.  HENT:     Head: Normocephalic and atraumatic.  Eyes:     Extraocular Movements: Extraocular movements intact.     Pupils: Pupils are equal, round, and reactive to light.  Cardiovascular:     Rate and Rhythm: Normal rate and regular rhythm.     Pulses: Normal pulses.  Pulmonary:     Effort: Pulmonary effort is normal.     Breath sounds: Normal breath sounds.  Abdominal:     Palpations: Abdomen is soft.  Musculoskeletal:     Cervical back: Normal range of motion.     Right hip: Bony tenderness present.  Neurological:     Mental Status: She is alert and oriented to person, place, and time.  Psychiatric:        Behavior: Behavior normal.     Vital signs in last 24 hours: Weight:  [123.8 kg] 123.8 kg (03/07 0918)   Labs:   Estimated body mass index is  45.43 kg/m as calculated from the following:   Height as of this encounter: 5\' 5"  (1.651 m).   Weight as of this encounter: 123.8 kg.  Imaging Review:  Plain radiographs demonstrate evidence of loosening of the femoral stem.The bone quality appears to be good for age and reported activity level.   Assessment/Plan:  Failure/lossening of right total hip arthroplasty  Revision total hip arthroplasty is deemed medically necessary due to the patient's continued right hip pain as well as her radiographic findings suggesting loosening of the right hip femoral component. The risks and benefits of total hip revision arthroplasty were presented and reviewed. The risks due to aseptic loosening, infection, stiffness, dislocation/subluxation,  thromboembolic complications and other imponderables were discussed.  The patient acknowledged the explanation, agreed to proceed with the plan and consent was signed. Patient is being admitted for inpatient treatment for surgery, pain control, PT, OT, prophylactic antibiotics, VTE prophylaxis, progressive ambulation and ADL's and discharge planning.

## 2020-05-20 NOTE — Anesthesia Procedure Notes (Signed)
Spinal  Patient location during procedure: OR Start time: 05/20/2020 2:56 PM End time: 05/20/2020 2:59 PM Staffing Performed: anesthesiologist  Anesthesiologist: Kaylyn Layer, MD Preanesthetic Checklist Completed: patient identified, IV checked, risks and benefits discussed, surgical consent, monitors and equipment checked, pre-op evaluation and timeout performed Spinal Block Patient position: sitting Prep: DuraPrep and site prepped and draped Patient monitoring: continuous pulse ox, blood pressure and heart rate Approach: midline Location: L3-4 Injection technique: single-shot Needle Needle type: Pencan  Needle gauge: 24 G Needle length: 9 cm Additional Notes Risks, benefits, and alternative discussed. Patient gave consent to procedure. Prepped and draped in sitting position. Patient sedated but responsive to voice. Clear CSF obtained after one needle redirection. Positive terminal aspiration. No pain or paraesthesias with injection. Patient tolerated procedure well. Vital signs stable. Amalia Greenhouse, MD

## 2020-05-21 ENCOUNTER — Encounter (HOSPITAL_COMMUNITY): Payer: Self-pay | Admitting: Orthopaedic Surgery

## 2020-05-21 DIAGNOSIS — T84090A Other mechanical complication of internal right hip prosthesis, initial encounter: Secondary | ICD-10-CM

## 2020-05-21 DIAGNOSIS — Z96641 Presence of right artificial hip joint: Secondary | ICD-10-CM

## 2020-05-21 LAB — GLUCOSE, CAPILLARY
Glucose-Capillary: 157 mg/dL — ABNORMAL HIGH (ref 70–99)
Glucose-Capillary: 143 mg/dL — ABNORMAL HIGH (ref 70–99)
Glucose-Capillary: 160 mg/dL — ABNORMAL HIGH (ref 70–99)
Glucose-Capillary: 140 mg/dL — ABNORMAL HIGH (ref 70–99)
Glucose-Capillary: 147 mg/dL — ABNORMAL HIGH (ref 70–99)

## 2020-05-21 LAB — CBC
HCT: 34 % — ABNORMAL LOW (ref 36.0–46.0)
Hemoglobin: 11.1 g/dL — ABNORMAL LOW (ref 12.0–15.0)
MCH: 27.1 pg (ref 26.0–34.0)
MCHC: 32.6 g/dL (ref 30.0–36.0)
MCV: 83.1 fL (ref 80.0–100.0)
Platelets: 264 10*3/uL (ref 150–400)
RBC: 4.09 MIL/uL (ref 3.87–5.11)
RDW: 16.3 % — ABNORMAL HIGH (ref 11.5–15.5)
WBC: 10.9 10*3/uL — ABNORMAL HIGH (ref 4.0–10.5)
nRBC: 0 % (ref 0.0–0.2)

## 2020-05-21 LAB — BASIC METABOLIC PANEL
Anion gap: 10 (ref 5–15)
BUN: 8 mg/dL (ref 6–20)
CO2: 21 mmol/L — ABNORMAL LOW (ref 22–32)
Calcium: 8.5 mg/dL — ABNORMAL LOW (ref 8.9–10.3)
Chloride: 104 mmol/L (ref 98–111)
Creatinine, Ser: 0.7 mg/dL (ref 0.44–1.00)
GFR, Estimated: >60 mL/min (ref >60)
Glucose, Bld: 151 mg/dL — ABNORMAL HIGH (ref 70–99)
Potassium: 4 mmol/L (ref 3.5–5.1)
Sodium: 135 mmol/L (ref 135–145)

## 2020-05-21 NOTE — Progress Notes (Signed)
Physical Therapy Treatment Patient Details Name: Emily Hester MRN: [id number] DOB: 1970-05-19 Today's Date: 05/21/2020    History of Present Illness Pt is 50 yo female s/p R anterior THA revision on 05/21/20.  She has PMH of R THA 12/18, DM, HTN, GERD, and arthirits    PT Comments    Pt with gradual progress this afternoon, but still limited by pain.  Will continue to benefit from acute PT to advance mobility.    Follow Up Recommendations  Follow surgeon's recommendation for DC plan and follow-up therapies;Supervision/Assistance - 24 hour     Equipment Recommendations  None recommended by PT    Recommendations for Other Services       Precautions / Restrictions Precautions Precautions: Fall Restrictions RLE Weight Bearing: Weight bearing as tolerated    Mobility  Bed Mobility Overal bed mobility: Needs Assistance Bed Mobility: Supine to Sit;Sit to Supine     Supine to sit: Mod assist;HOB elevated Sit to supine: Min assist;HOB elevated   General bed mobility comments: Increased time, cues for sequencing, assist for R LE and for hand held to lift trunk to sit; min A for R leg back to bed with cues to use arms to scoot back    Transfers Overall transfer level: Needs assistance Equipment used: Rolling walker (2 wheeled) Transfers: Sit to/from Stand Sit to Stand: Min assist;From elevated surface         General transfer comment: From bed Cues for hand placement and R LE management; min A to rise; pain improved with sitting with cues to extend R leg forward  Ambulation/Gait Ambulation/Gait assistance: Min guard Gait Distance (Feet): 20 Feet Assistive device: Rolling walker (2 wheeled) Gait Pattern/deviations: Step-to pattern;Decreased stride length;Decreased weight shift to right;Decreased stance time - right Gait velocity: decreased   General Gait Details: Min guard for safety; cues for sequence and posture and RW proximity   Acupuncturist    Modified Rankin (Stroke Patients Only)       Balance Overall balance assessment: Needs assistance Sitting-balance support: No upper extremity supported Sitting balance-Leahy Scale: Good     Standing balance support: Bilateral upper extremity supported;Single extremity supported Standing balance-Leahy Scale: Poor Standing balance comment: required RW                            Cognition Arousal/Alertness: Awake/alert Behavior During Therapy: WFL for tasks assessed/performed Overall Cognitive Status: Within Functional Limits for tasks assessed                                        Exercises Total Joint Exercises Ankle Circles/Pumps: AROM;Both;10 reps;Seated Quad Sets: AROM;Both;Supine;10 reps Heel Slides: AAROM;Right;10 reps;Supine (very limited ROM due to pain) Hip ABduction/ADduction: AAROM;Right;Supine;10 reps (very limited ROM due to pain)    General Comments General comments (skin integrity, edema, etc.): Pt's mother reports that her O2 dropped earlier while sleeping and now on 2 L O2.  Pt was able to maintain O2 sats 95% on RA during therapy when awake and with activity.  Did reapply O2 and continuous pulse oximeter post therapy.      Pertinent Vitals/Pain Pain Assessment: 0-10 Pain Score: 8  (8/10 movement) Pain Location: R hip Pain Descriptors / Indicators: Discomfort;Sharp;Moaning;Grimacing Pain Intervention(s): Limited activity within patient's tolerance;Monitored during session;Premedicated before  session;Ice applied    Home Living                      Prior Function            PT Goals (current goals can now be found in the care plan section) Acute Rehab PT Goals Patient Stated Goal: return home; decrease pain PT Goal Formulation: With patient/family Time For Goal Achievement: 06/04/20 Potential to Achieve Goals: Good Progress towards PT goals: Progressing toward goals     Frequency    7X/week      PT Plan Current plan remains appropriate    Co-evaluation              AM-PAC PT "6 Clicks" Mobility   Outcome Measure  Help needed turning from your back to your side while in a flat bed without using bedrails?: A Little Help needed moving from lying on your back to sitting on the side of a flat bed without using bedrails?: A Lot Help needed moving to and from a bed to a chair (including a wheelchair)?: A Little Help needed standing up from a chair using your arms (e.g., wheelchair or bedside chair)?: A Little Help needed to walk in hospital room?: A Little Help needed climbing 3-5 steps with a railing? : A Lot 6 Click Score: 16    End of Session Equipment Utilized During Treatment: Gait belt Activity Tolerance: Patient tolerated treatment well Patient left: with call bell/phone within reach;with family/visitor present;in bed;with bed alarm set Nurse Communication: Mobility status PT Visit Diagnosis: Other abnormalities of gait and mobility (R26.89);Muscle weakness (generalized) (M62.81)     Time: 6606-3016 PT Time Calculation (min) (ACUTE ONLY): 20 min  Charges:  $Gait Training: 8-22 mins                     Anise Salvo, PT Acute Rehab Services Pager [phone] [facility] [phone]     Rayetta Humphrey 05/21/2020, 5:34 PM

## 2020-05-21 NOTE — Plan of Care (Signed)
Care Plan Added 

## 2020-05-21 NOTE — Op Note (Signed)
NAME: Emily Hester, Emily Hester MEDICAL RECORD NO: [id number] ACCOUNT NO: [id number] DATE OF BIRTH: 1970/11/21 FACILITY: [facility] LOCATION: [facility] PHYSICIAN: Vanita Panda. Magnus Ivan, MD  Operative Report   DATE OF PROCEDURE: 05/20/2020  PREOPERATIVE DIAGNOSIS:  Right hip with questionable loosening of the femoral prosthesis.  POSTOPERATIVE DIAGNOSES: 1.  No loosening found of right hip femoral or acetabular component. 2.  Scar tissue around right hip. 3.  Very minimal leg length discrepancy with the right side just shorter than the left.  PROCEDURES PERFORMED:   1.  Right hip joint exploration. 2.  Exchange of polyethylene liner, right hip acetabular component and exchange of right hip ball. 3.  Supplemental bone grafting around the collar and calcar region of right hip femoral prosthesis with allograft bone graft and putty.  FINDINGS:  No loosening seen of right hip femoral or acetabular component with only slight poly liner wear, slight leg length discrepancy and scar tissue around the hip; no fluid collection or worsen findings found of the soft tissue and no evidence of  infection.  SURGEON:  Vanita Panda. Magnus Ivan, MD  ASSISTANT:  Richardean Canal, PA-C.  ANESTHESIA:  Spinal.  ANTIBIOTICS:  3 grams IV Ancef.  ESTIMATED BLOOD LOSS:  500 mL  COMPLICATIONS:  None.  INDICATIONS:  The patient is a 50 year old female well known to me.  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  She did have a bout of right hip pain for some time and was sent to me for  an anterior hip replacement given her morbid obesity.  She was declined through other surgeons to have this surgery performed due to her morbid obesity.  I was comfortable with proceeding with surgery, having had good success with an anterior hip  approach.  The surgery went successful, but ever since surgery she has continued to have right hip pain.  She has shown  radiographic evidence of potential loosening around the shoulder of the femoral prosthesis, but her clinical exam is not fit someone  with loosening.  She has severe pain even without motion.  We worked her up from lumbar spine standpoint and found some degenerative changes that may be affecting the right side, I have even performed a bursectomy on her right hip trochanteric area.  All  conservative treatment measures have failed.  Her pain is significantly out of proportion of exam for some one with even micromotion of the femoral component.  There has been no evidence of infection.  All her labs have been normal and aspirations of  the hip have been normal.  She has significant pain to even just barely touching around the soft tissue around her hip.  At this point, we have exhausted all conservative treatment measures and the thoughts were we need to explore the right hip to look  for a source of pain she is having and to revise the femoral component at a minimum if we found loosening.  She understands the risks and benefits of this surgery and after exhausting all conservative treatment measures does wish to proceed.  We had a  long and thorough discussion about the risk of acute blood loss anemia, destabilizing the hip with dislocations and infection, all of these being heightened given her morbid obesity.  DESCRIPTION OF PROCEDURE:  After informed consent was obtained, appropriate right hip was marked.  She was brought to the operating room and sat up on a stretcher where spinal anesthesia was obtained.  She  was laid in supine position on the stretcher.   Foley catheter was placed.  We were able to assess her leg lengths and she is just barely slightly shorter on that right side than the left, but hip was stable in motion of the hip.  Next, we placed her supine on the Hana fracture table, the perineal  post in place and both legs in line skeletal traction devices.  No traction applied.  We assessed her  right hip radiographically before starting the case and prepping and draping and put it through range of motion.  Again, it was completely stable to  full range of motion and no worrisome features radiographically, we see under C-arm.  We then prepped the hip with DuraPrep and sterile drapes.  A timeout was called.  She was identified correct patient, correct right hip.  We then made an incision,  which was a direct anterior approach to the hip through her previous incision, dissected down through the soft tissues.  This was certainly difficult given the scar tissue and her obesity.  We were finally able to identify the tensor fascia and divided  it and we were able to finally get down to the hip itself.  All the way down we did not find any worrisome tissue or fluid collections and we held giving her antibiotics until we wanted to see what the hip looked like.  We were able to find the joint  capsule that we repaired, so we opened the joint capsule completely and did not find any effusion at all.  We removed scar tissue that was built up all around the acetabular component and was then able to dislocate the hip.  We removed the previous  ceramic trial hip ball.  I then assessed the femoral component and removed scar tissue from 360 degrees around the femoral component to see if the component was loose.  Of note, it is proud off the calcar and never subsided.  There was no loosening of  the component at all that we could see, grossly it would not move and it moved with the femur when we put it through motion.  I did use a backslap hammer and tried to backslap out the femoral component and it would not budge at all.  I could see the HA  coating on the shoulder of the stem and did not see any evidence grossly of loosening at all and again there was no motion that I could detect.  At that point, I did not feel that it was appropriate to cut that out of her bone because that would have  needed to require an  osteotomy and I think it would be more debilitating for her.  We then removed the polyethylene liner and found just minimal wear on it.  The acetabular component was solid and there was no worrisome film or features behind the  acetabular liner.  We did go ahead and give her the 3 grams of IV Ancef after thoroughly assessing the hip itself.  Again, there was no worrisome fluid collections and acetabular component moved as a unit.  We then irrigated all the soft tissue  thoroughly with 3 liters of normal saline solution.  I placed a new 32+4 polyethylene liner for that size 50 acetabular component.  I then decided to put bone graft with cancellous chips and putty around the collar and calcar region and into the shoulder  and pack this area well.  Again, I just did this to  supplement the tissues.  We then went with a longer metal hip ball than the +5 that she had and we went up to a +9 metal hip ball and reduced this and acetabulum and it was stable, assessed  mechanically and radiographically.  We then reapproximated as much tissue as we could over the joint capsule with a #1 Ethibond suture followed by closing the deep tissue with #1 Vicryl. The next layer was closed with 0 Vicryl and then finally 2-0 Vicryl  in subcutaneous tissue.  The skin was closed with interrupted 2-0 nylon suture.  Xeroform and Aquacel dressing was applied.  We again assessed the hip radiographically and we were pleased.  She was then awoken and taken off the Hana table and taken to  recovery in stable condition with all final counts being correct and no complications noted.  IMPLANTS:  Removed a 32+4 polyethylene liner for a size 50 acetabular component and a 32+5 ceramic hip ball and replaced this with a 32+4 size 50 polyethylene acetabular component and a 32+9 metal hip ball.  Of note, Richardean Canal, PA-C did assist during the entire case and his assistance was crucial for facilitating all aspects of this case.   PUS D:  05/20/2020 5:01:21 pm T: 05/21/2020 2:45:00 am  JOB: [id number]/ [id number]

## 2020-05-21 NOTE — Evaluation (Signed)
Physical Therapy Evaluation Patient Details Name: Emily Hester MRN: [id number] DOB: March 14, 1971 Today's Date: 05/21/2020   History of Present Illness  Pt is 50 yo female s/p R anterior THA revision on 05/21/20.  She has PMH of R THA 12/18, DM, HTN, GERD, and arthirits  Clinical Impression  Pt is s/p R THA revision resulting in the deficits listed below (see PT Problem List). Pt was limited by pain and nausea at evaluation, but was able to get OOB and take a few steps with assist.  Pt required cues for sequencing and transfer techniques.  Pt has DME at home and reports will have initial 24 hr assist.  Expected to progress well once pain controlled and post op nausea improves.  Pt will benefit from skilled PT to increase their independence and safety with mobility to allow discharge to the venue listed below.       Follow Up Recommendations Follow surgeon's recommendation for DC plan and follow-up therapies;Supervision/Assistance - 24 hour    Equipment Recommendations  None recommended by PT    Recommendations for Other Services       Precautions / Restrictions Precautions Precautions: Fall Restrictions Weight Bearing Restrictions: Yes RLE Weight Bearing: Weight bearing as tolerated      Mobility  Bed Mobility Overal bed mobility: Needs Assistance Bed Mobility: Supine to Sit     Supine to sit: Mod assist;HOB elevated     General bed mobility comments: Increased time, cues for sequencing, assist for R LE and for hand held to lift trunk    Transfers Overall transfer level: Needs assistance Equipment used: Rolling walker (2 wheeled) Transfers: Sit to/from Stand Sit to Stand: Min assist         General transfer comment: From bed and bsc.  Cues for hand placement and R LE management; min A to rise  Ambulation/Gait Ambulation/Gait assistance: Min guard Gait Distance (Feet): 10 Feet (3' then 10') Assistive device: Rolling walker (2 wheeled) Gait Pattern/deviations:  Step-to pattern;Decreased stride length;Decreased weight shift to right;Decreased stance time - right Gait velocity: decreased   General Gait Details: Limited due to pain and nausea; cues for sequencing with min guard for safety  Stairs            Wheelchair Mobility    Modified Rankin (Stroke Patients Only)       Balance Overall balance assessment: Needs assistance Sitting-balance support: No upper extremity supported Sitting balance-Leahy Scale: Fair     Standing balance support: Bilateral upper extremity supported;Single extremity supported Standing balance-Leahy Scale: Poor Standing balance comment: required RW                             Pertinent Vitals/Pain Pain Assessment: 0-10 Pain Score: 7  Pain Location: R hip Pain Descriptors / Indicators: Discomfort;Sharp Pain Intervention(s): Limited activity within patient's tolerance;Monitored during session;Premedicated before session;Repositioned;Ice applied    Home Living Family/patient expects to be discharged to:: Private residence Living Arrangements: Spouse/significant other Available Help at Discharge: Family;Available 24 hours/day Type of Home: House Home Access: Stairs to enter Entrance Stairs-Rails: Right Entrance Stairs-Number of Steps: 4 Home Layout: One level;Laundry or work area in [employer] Equipment: Emergency planning/management officer - 2 wheels;Bedside commode      Prior Function Level of Independence: Needs assistance   Gait / Transfers Assistance Needed: Walking with a cane ; short distance community ambulation; used motorized carts at grocery store  ADL's / Homemaking Assistance Needed: Required assist for lower  body ADLs and bending due to hip; could stand in kitchen and fix meals with seated rest breaks  Comments: Pt did drive     Hand Dominance        Extremity/Trunk Assessment   Upper Extremity Assessment Upper Extremity Assessment: Overall WFL for tasks assessed    Lower  Extremity Assessment Lower Extremity Assessment: LLE deficits/detail;RLE deficits/detail RLE Deficits / Details: Expected deficits post op; ROM: R hip < L hip but WFL; MMT: ankle 5/5, hip and knee 1/5 limited by pain LLE Deficits / Details: WFL    Cervical / Trunk Assessment Cervical / Trunk Assessment: Normal  Communication   Communication: No difficulties  Cognition Arousal/Alertness: Awake/alert Behavior During Therapy: WFL for tasks assessed/performed Overall Cognitive Status: Within Functional Limits for tasks assessed                                        General Comments General comments (skin integrity, edema, etc.): Pt with nausea and vomitting when up to bsc - notified RN and NT.  Pt had c/o mild lightheadedness when standing (she had received Dilaudid) - O2 sats 93% on RA, HR 100-118 bpm, BP 126/65 in chair    Exercises Total Joint Exercises Ankle Circles/Pumps: AROM;Both;10 reps;Seated Quad Sets: AROM;Both;5 reps;Supine   Assessment/Plan    PT Assessment Patient needs continued PT services  PT Problem List Decreased strength;Decreased mobility;Decreased safety awareness;Decreased range of motion;Decreased activity tolerance;Decreased balance;Decreased knowledge of use of DME;Pain;Decreased coordination       PT Treatment Interventions DME instruction;Therapeutic activities;Modalities;Gait training;Therapeutic exercise;Patient/family education;Stair training;Balance training;Functional mobility training    PT Goals (Current goals can be found in the Care Plan section)  Acute Rehab PT Goals Patient Stated Goal: return home; decrease pain PT Goal Formulation: With patient/family Time For Goal Achievement: 06/04/20 Potential to Achieve Goals: Good    Frequency 7X/week   Barriers to discharge        Co-evaluation               AM-PAC PT "6 Clicks" Mobility  Outcome Measure Help needed turning from your back to your side while in a flat  bed without using bedrails?: A Little Help needed moving from lying on your back to sitting on the side of a flat bed without using bedrails?: A Lot Help needed moving to and from a bed to a chair (including a wheelchair)?: A Little Help needed standing up from a chair using your arms (e.g., wheelchair or bedside chair)?: A Little Help needed to walk in hospital room?: A Little Help needed climbing 3-5 steps with a railing? : A Lot 6 Click Score: 16    End of Session Equipment Utilized During Treatment: Gait belt Activity Tolerance: Patient tolerated treatment well Patient left: with call bell/phone within reach;with chair alarm set;in chair;with family/visitor present Nurse Communication: Mobility status PT Visit Diagnosis: Other abnormalities of gait and mobility (R26.89);Muscle weakness (generalized) (M62.81)    Time: 7989-2119 PT Time Calculation (min) (ACUTE ONLY): 34 min   Charges:   PT Evaluation $PT Eval Moderate Complexity: 1 Mod PT Treatments $Therapeutic Activity: 8-22 mins        Anise Salvo, PT Acute Rehab Services Pager [phone] [facility] [phone]    Rayetta Humphrey 05/21/2020, 11:43 AM

## 2020-05-21 NOTE — Plan of Care (Signed)
  Problem: Health Behavior/Discharge Planning: Goal: Ability to manage health-related needs will improve Outcome: Progressing   Problem: Activity: Goal: Risk for activity intolerance will decrease Outcome: Progressing   Problem: Pain Managment: Goal: General experience of comfort will improve Outcome: Not Progressing   

## 2020-05-21 NOTE — Progress Notes (Signed)
Subjective: 1 Day Post-Op Procedure(s) (LRB): RIGHT TOTAL HIP REVISION- ploy exhange and hip ball exchange with bone graft (Right) Patient reports pain as moderate.  Eating drinking well.   Objective: Vital signs in last 24 hours: Temp:  [96.8 F (36 C)-99 F (37.2 C)] 98.4 F (36.9 C) (03/09 0735) Pulse Rate:  [73-109] 99 (03/09 0735) Resp:  [16-26] 16 (03/09 0735) BP: (81-133)/(56-78) 129/78 (03/09 0735) SpO2:  [94 %-100 %] 96 % (03/09 0735) Weight:  [123.8 kg] 123.8 kg (03/08 1203)  Intake/Output from previous day: 03/08 0701 - 03/09 0700 In: 2720 [P.O.:120; I.V.:2400; IV Piggyback:200] Out: 1650 [Urine:1400; Blood:250] Intake/Output this shift: No intake/output data recorded.  Recent Labs    05/20/20 1154 05/21/20 0236  HGB 13.6 11.1*   Recent Labs    05/20/20 1154 05/21/20 0236  WBC 9.3 10.9*  RBC 5.11 4.09  HCT 42.6 34.0*  PLT 298 264   Recent Labs    05/20/20 1154 05/21/20 0236  NA 136 135  K 4.0 4.0  CL 100 104  CO2 23 21*  BUN 9 8  CREATININE 0.72 0.70  GLUCOSE 108* 151*  CALCIUM 9.3 8.5*   No results for input(s): LABPT, INR in the last 72 hours.  Right lower extremity: Dorsiflexion/Plantar flexion intact Incision: dressing C/D/I Compartment soft Tenderness over right greater troch region   Assessment/Plan: 1 Day Post-Op Procedure(s) (LRB): RIGHT TOTAL HIP REVISION- ploy exhange and hip ball exchange with bone graft (Right) Up with therapy    Emily Hester 05/21/2020, 7:58 AM

## 2020-05-21 NOTE — Anesthesia Postprocedure Evaluation (Signed)
Anesthesia Post Note  Patient: Iliza Blankenbeckler Stall  Procedure(s) Performed: RIGHT TOTAL HIP REVISION- ploy exhange and hip ball exchange with bone graft (Right Hip)     Patient location during evaluation: PACU Anesthesia Type: Spinal Level of consciousness: oriented and awake and alert Pain management: pain level controlled Vital Signs Assessment: post-procedure vital signs reviewed and stable Respiratory status: spontaneous breathing, respiratory function stable and patient connected to nasal cannula oxygen Cardiovascular status: blood pressure returned to baseline and stable Postop Assessment: no headache, no backache and no apparent nausea or vomiting Anesthetic complications: no   No complications documented.  Last Vitals:  Vitals:   05/21/20 0559 05/21/20 0735  BP: 122/76 129/78  Pulse: (!) 108 99  Resp:  16  Temp: 37 C 36.9 C  SpO2: 99% 96%    Last Pain:  Vitals:   05/21/20 0704  TempSrc:   PainSc: 5                  Shaneequa Bahner P Kaisyn Reinhold

## 2020-05-22 LAB — GLUCOSE, CAPILLARY: Glucose-Capillary: 137 mg/dL — ABNORMAL HIGH (ref 70–99)

## 2020-05-22 NOTE — Plan of Care (Signed)

## 2020-05-22 NOTE — Progress Notes (Signed)
Physical Therapy Treatment Patient Details Name: Emily Hester MRN: [id number] DOB: [birth year] Today's Date: 05/22/2020    History of Present Illness Pt is 50 yo female s/p R anterior THA revision on 05/21/20.  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  She was able to increase ambulation distance with increased speed.  Pt required mod A for bed mobility , but otherwise only needing min guard.  Pt still with increased pain that limits, but improved from yesterday. She does have 4 steps to enter her home and needs to practice these next session.     Follow Up Recommendations  Follow surgeon's recommendation for DC plan and follow-up therapies;Supervision/Assistance - 24 hour     Equipment Recommendations  None recommended by PT    Recommendations for Other Services       Precautions / Restrictions Precautions Precautions: Fall Restrictions RLE Weight Bearing: Weight bearing as tolerated    Mobility  Bed Mobility Overal bed mobility: Needs Assistance Bed Mobility: Supine to Sit     Supine to sit: Mod assist;HOB elevated     General bed mobility comments: Increased time, cues for sequencing, assist for R LE and for hand held to lift trunk to sit    Transfers Overall transfer level: Needs assistance Equipment used: Rolling walker (2 wheeled) Transfers: Sit to/from Stand Sit to Stand: Min guard         General transfer comment: Min guard for safety; did not need cues for safe hand placement or R LE management.  Performed from bed and BSC.  Did require assist for tolieting ADLs  Ambulation/Gait Ambulation/Gait assistance: Min guard Gait Distance (Feet): 75 Feet Assistive device: Rolling walker (2 wheeled) Gait Pattern/deviations: Step-to pattern;Decreased stride length;Decreased weight shift to right;Decreased stance time - right Gait velocity: decreased   General Gait Details: Min guard for safety;  cues for sequence and posture and RW proximity   Stairs             Wheelchair Mobility    Modified Rankin (Stroke Patients Only)       Balance Overall balance assessment: Needs assistance Sitting-balance support: No upper extremity supported Sitting balance-Leahy Scale: Good     Standing balance support: Bilateral upper extremity supported;No upper extremity supported Standing balance-Leahy Scale: Fair Standing balance comment: RW for ambulation but can static stand wtihout support                            Cognition Arousal/Alertness: Awake/alert Behavior During Therapy: WFL for tasks assessed/performed Overall Cognitive Status: Within Functional Limits for tasks assessed                                        Exercises Total Joint Exercises Ankle Circles/Pumps: AROM;Both;10 reps;Seated Quad Sets: AROM;Both;Supine;10 reps Short Arc Quad: AROM;Both;10 reps;Supine Heel Slides: AAROM;Right;10 reps;Supine (limited ROM due to pain)    General Comments General comments (skin integrity, edema, etc.): VSS on RA      Pertinent Vitals/Pain Pain Assessment: 0-10 Pain Score: 6  Pain Location: R hip Pain Descriptors / Indicators: Discomfort;Sharp;Grimacing Pain Intervention(s): Limited activity within patient's tolerance;Monitored during session;Premedicated before session;Ice applied    Home Living  Prior Function            PT Goals (current goals can now be found in the care plan section) Acute Rehab PT Goals Patient Stated Goal: return home; decrease pain PT Goal Formulation: With patient/family Time For Goal Achievement: 06/04/20 Potential to Achieve Goals: Good Progress towards PT goals: Progressing toward goals    Frequency    7X/week      PT Plan Current plan remains appropriate    Co-evaluation              AM-PAC PT "6 Clicks" Mobility   Outcome Measure  Help needed turning  from your back to your side while in a flat bed without using bedrails?: A Little Help needed moving from lying on your back to sitting on the side of a flat bed without using bedrails?: A Lot Help needed moving to and from a bed to a chair (including a wheelchair)?: A Little Help needed standing up from a chair using your arms (e.g., wheelchair or bedside chair)?: A Little Help needed to walk in hospital room?: A Little Help needed climbing 3-5 steps with a railing? : A Lot 6 Click Score: 16    End of Session Equipment Utilized During Treatment: Gait belt Activity Tolerance: Patient tolerated treatment well Patient left: with call bell/phone within reach;with family/visitor present;in chair;with chair alarm set Nurse Communication: Mobility status PT Visit Diagnosis: Other abnormalities of gait and mobility (R26.89);Muscle weakness (generalized) (M62.81)     Time: 1017-5102 PT Time Calculation (min) (ACUTE ONLY): 30 min  Charges:  $Gait Training: 8-22 mins $Therapeutic Activity: 8-22 mins                     Emily Hester, PT Acute Rehab Services Pager [phone] [facility] [phone]     Rayetta Humphrey 05/22/2020, 11:12 AM

## 2020-05-22 NOTE — Progress Notes (Signed)
Physical Therapy Treatment Patient Details Name: Emily Hester MRN: [id number] DOB: 12-22-1970 Today's Date: 05/22/2020    History of Present Illness Pt is 50 yo female s/p R anterior THA revision on 05/21/20.  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  Still limited by pain but is tolerating mobility with assist of 1.   Pt reports she is hoping to discharge tomorrow and from PT perspective will likely demonstrate mobility necessary to do this.    Follow Up Recommendations  Follow surgeon's recommendation for DC plan and follow-up therapies;Supervision/Assistance - 24 hour     Equipment Recommendations  None recommended by PT    Recommendations for Other Services       Precautions / Restrictions Precautions Precautions: Fall Restrictions Weight Bearing Restrictions: Yes RLE Weight Bearing: Weight bearing as tolerated    Mobility  Bed Mobility Overal bed mobility: Needs Assistance Bed Mobility: Supine to Sit     Supine to sit: Min assist     General bed mobility comments: Increased time, cues for sequencing, assist for R LE but was able to manage trunk herself    Transfers Overall transfer level: Needs assistance Equipment used: Rolling walker (2 wheeled) Transfers: Sit to/from Stand Sit to Stand: Min guard         General transfer comment: Min guard for safety; did not need cues for safe hand placement or R LE management.  Performed from bed and recliner.  Ambulation/Gait Ambulation/Gait assistance: Min guard Gait Distance (Feet): 50 Feet Assistive device: Rolling walker (2 wheeled) Gait Pattern/deviations: Step-to pattern;Decreased stride length;Decreased weight shift to right;Decreased stance time - right Gait velocity: decreased   General Gait Details: Min guard for safety; cues for sequence and posture and RW  proximity   Stairs Stairs: Yes Stairs assistance: Min guard Stair Management: One rail Right;Step to pattern;Sideways Number of Stairs: 2 General stair comments: Pt went up/down 2 steps with rail and R.  Demonstrated and educated on sequencing.  Performed slowly with min guard.   Wheelchair Mobility    Modified Rankin (Stroke Patients Only)       Balance Overall balance assessment: Needs assistance Sitting-balance support: No upper extremity supported Sitting balance-Leahy Scale: Good     Standing balance support: Bilateral upper extremity supported;No upper extremity supported Standing balance-Leahy Scale: Fair Standing balance comment: RW for ambulation but can static stand wtihout support                            Cognition Arousal/Alertness: Awake/alert Behavior During Therapy: WFL for tasks assessed/performed Overall Cognitive Status: Within Functional Limits for tasks assessed                                        Exercises Total Joint Exercises Ankle Circles/Pumps: AROM;Both;10 reps;Seated Quad Sets: AROM;Both;Supine;10 reps Short Arc Quad: AROM;Both;10 reps;Supine Heel Slides: AAROM;Right;Supine;5 reps Hip ABduction/ADduction: AAROM;Right;Supine;5 reps Long Arc Quad: AROM;Right;5 reps;Seated Other Exercises Other Exercises: Provided HEP handout for total hips and educated on AAROM technique, not doing standing exercises at this time, and backing off on reps/ROM of exercises if too painful for first couple of weeks.  Encouraged focus on mobility if exercises too painful.    General  Comments General comments (skin integrity, edema, etc.): VSS on RA      Pertinent Vitals/Pain Pain Assessment: 0-10 Pain Score: 6  Pain Location: R hip Pain Descriptors / Indicators: Discomfort;Sharp;Grimacing Pain Intervention(s): Limited activity within patient's tolerance;Monitored during session;Premedicated before session;Ice applied     Home Living                      Prior Function            PT Goals (current goals can now be found in the care plan section) Acute Rehab PT Goals Patient Stated Goal: return home; decrease pain PT Goal Formulation: With patient/family Time For Goal Achievement: 06/04/20 Potential to Achieve Goals: Good Progress towards PT goals: Progressing toward goals    Frequency    7X/week      PT Plan Current plan remains appropriate    Co-evaluation              AM-PAC PT "6 Clicks" Mobility   Outcome Measure  Help needed turning from your back to your side while in a flat bed without using bedrails?: A Little Help needed moving from lying on your back to sitting on the side of a flat bed without using bedrails?: A Lot Help needed moving to and from a bed to a chair (including a wheelchair)?: A Little Help needed standing up from a chair using your arms (e.g., wheelchair or bedside chair)?: A Little Help needed to walk in hospital room?: A Little Help needed climbing 3-5 steps with a railing? : A Little 6 Click Score: 17    End of Session Equipment Utilized During Treatment: Gait belt Activity Tolerance: Patient tolerated treatment well Patient left: with call bell/phone within reach;with family/visitor present;in chair Nurse Communication: Mobility status PT Visit Diagnosis: Other abnormalities of gait and mobility (R26.89);Muscle weakness (generalized) (M62.81)     Time: 3825-0539 PT Time Calculation (min) (ACUTE ONLY): 30 min  Charges:  $Gait Training: 8-22 mins $Therapeutic Exercise: 8-22 mins                     Anise Salvo, PT Acute Rehab Services Pager [phone] [facility] [phone]     Rayetta Humphrey 05/22/2020, 2:26 PM

## 2020-05-22 NOTE — Progress Notes (Signed)
Patient ID: Emily Hester, female   DOB: 09/27/1970, 50 y.o.   MRN: [id number] Patient is awake and alert this morning.  Her mobility is certainly limited by pain which is to be expected given the extent of her surgery.  Her dressing is intact and clean over her right hip incision.  We talked in detail about her surgery.  Hopefully she will continue to slowly improve with her pain levels and mobility as things eased off.  Discharge will be when she is safely mobilizing.  Her O2 sats have been down some.  Some of this I believe is secondary to a combination of her being in the supine position, her weight and the need for narcotic pain medications to control her pain.  I have encouraged her to be up more and to cough and deep breathe and use her incentive spirometer.

## 2020-05-23 LAB — CBC
HCT: 29.3 % — ABNORMAL LOW (ref 36.0–46.0)
Hemoglobin: 9.1 g/dL — ABNORMAL LOW (ref 12.0–15.0)
MCH: 26.5 pg (ref 26.0–34.0)
MCHC: 31.1 g/dL (ref 30.0–36.0)
MCV: 85.2 fL (ref 80.0–100.0)
Platelets: 237 10*3/uL (ref 150–400)
RBC: 3.44 MIL/uL — ABNORMAL LOW (ref 3.87–5.11)
RDW: 16.1 % — ABNORMAL HIGH (ref 11.5–15.5)
WBC: 12.4 10*3/uL — ABNORMAL HIGH (ref 4.0–10.5)
nRBC: 0 % (ref 0.0–0.2)

## 2020-05-23 LAB — GLUCOSE, CAPILLARY: Glucose-Capillary: 125 mg/dL — ABNORMAL HIGH (ref 70–99)

## 2020-05-23 MED ORDER — OXYCODONE HCL 5 MG PO TABS: 5.0000 mg | ORAL_TABLET | ORAL | 0 refills | Status: DC | PRN

## 2020-05-23 MED ORDER — ASPIRIN 81 MG PO CHEW: 81.0000 mg | CHEWABLE_TABLET | Freq: Two times a day (BID) | ORAL | 0 refills | Status: DC

## 2020-05-23 MED ORDER — TIZANIDINE HCL 4 MG PO TABS: 4.0000 mg | ORAL_TABLET | Freq: Four times a day (QID) | ORAL | 0 refills | Status: DC | PRN

## 2020-05-23 MED ORDER — ONDANSETRON 4 MG PO TBDP: 4.0000 mg | ORAL_TABLET | Freq: Three times a day (TID) | ORAL | 1 refills | Status: DC | PRN

## 2020-05-23 NOTE — Plan of Care (Signed)

## 2020-05-23 NOTE — Progress Notes (Signed)
Physical Therapy Treatment Patient Details Name: Emily Hester MRN: [id number] DOB: [birth year] Today's Date: 05/23/2020    History of Present Illness Pt is 50 yo female s/p R anterior THA revision on 05/21/20.  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 No significant changes from morning treatment. Pt's Mother was present and educated on assisting with transfers and AAROM with exercise.  Continue plan of care.   Follow Up Recommendations  Follow surgeon's recommendation for DC plan and follow-up therapies;Supervision/Assistance - 24 hour     Equipment Recommendations  None recommended by PT    Recommendations for Other Services       Precautions / Restrictions Precautions Precautions: Fall Restrictions RLE Weight Bearing: Weight bearing as tolerated    Mobility  Bed Mobility Overal bed mobility: Needs Assistance Bed Mobility: Supine to Sit;Sit to Supine     Supine to sit: Min assist Sit to supine: Min assist   General bed mobility comments: Increased time and use of bed rail.  Pt's mother present and was educated on and able to assist pt with R LE    Transfers Overall transfer level: Needs assistance Equipment used: Rolling walker (2 wheeled) Transfers: Sit to/from Stand Sit to Stand: Min guard         General transfer comment: Min guard for safety; did not need cues for safe hand placement or R LE management.  Performed x 1 from bed x1 from [facility] ; attempted sitting on toliet but too low requiring BSC.  Pt reports bed is high at home discussed use of step stool (backing to stool, up with L foot, then sit on bed)  Ambulation/Gait Ambulation/Gait assistance: Min guard Gait Distance (Feet): 120 Feet Assistive device: Rolling walker (2 wheeled) Gait Pattern/deviations: Decreased stride length;Decreased weight shift to right;Decreased stance time - right;Step-through pattern Gait velocity:  decreased but improving   General Gait Details: Min guard for safety; cues for sequence and posture and RW proximity; progressed to step through pattern   Stairs             Wheelchair Mobility    Modified Rankin (Stroke Patients Only)       Balance Overall balance assessment: Needs assistance Sitting-balance support: No upper extremity supported Sitting balance-Leahy Scale: Good     Standing balance support: Bilateral upper extremity supported;No upper extremity supported Standing balance-Leahy Scale: Fair Standing balance comment: RW for ambulation but can static stand wtihout support; pt able to perform toileting ADLs in standing                            Cognition Arousal/Alertness: Awake/alert Behavior During Therapy: WFL for tasks assessed/performed Overall Cognitive Status: Within Functional Limits for tasks assessed                                        Exercises Total Joint Exercises Ankle Circles/Pumps: AROM;Both;10 reps;Supine Quad Sets: AROM;Both;Supine;10 reps Heel Slides: AAROM;Right;Supine;15 reps (educated pt's mother on AAROM) Hip ABduction/ADduction: AAROM;Right;Supine;15 reps (educated pt's mother on [place]) Long Arc Quad: AROM;Right;Seated;10 reps    General Comments General comments (skin integrity, edema, etc.): VSS on RA when awake (placed back on 2 LPM O2 as pt planning to go to sleep).  Pt with questions regarding lab work and  oxygen - answered within scope of practice.      Pertinent Vitals/Pain Pain Assessment: 0-10 Pain Score: 7  Pain Location: R hip Pain Descriptors / Indicators: Discomfort;Sharp;Grimacing Pain Intervention(s): Limited activity within patient's tolerance;Monitored during session;Premedicated before session;Ice applied    Home Living                      Prior Function            PT Goals (current goals can now be found in the care plan section) Acute Rehab PT  Goals Patient Stated Goal: return home; decrease pain PT Goal Formulation: With patient/family Time For Goal Achievement: 06/04/20 Potential to Achieve Goals: Good Progress towards PT goals: Progressing toward goals    Frequency    7X/week      PT Plan Current plan remains appropriate    Co-evaluation              AM-PAC PT "6 Clicks" Mobility   Outcome Measure  Help needed turning from your back to your side while in a flat bed without using bedrails?: A Little Help needed moving from lying on your back to sitting on the side of a flat bed without using bedrails?: A Little Help needed moving to and from a bed to a chair (including a wheelchair)?: A Little Help needed standing up from a chair using your arms (e.g., wheelchair or bedside chair)?: A Little Help needed to walk in hospital room?: A Little Help needed climbing 3-5 steps with a railing? : A Little 6 Click Score: 18    End of Session Equipment Utilized During Treatment: Gait belt Activity Tolerance: Patient tolerated treatment well Patient left: with call bell/phone within reach;with family/visitor present;in bed;with bed alarm set Nurse Communication: Mobility status PT Visit Diagnosis: Other abnormalities of gait and mobility (R26.89);Muscle weakness (generalized) (M62.81)     Time: 1859-0931 PT Time Calculation (min) (ACUTE ONLY): 39 min  Charges:  $Gait Training: 8-22 mins $Therapeutic Exercise: 8-22 mins $Therapeutic Activity: 8-22 mins                     Anise Salvo, PT Acute Rehab Services Pager [phone] [facility] [phone]     Rayetta Humphrey 05/23/2020, 3:24 PM

## 2020-05-23 NOTE — Progress Notes (Signed)
Physical Therapy Treatment Patient Details Name: Emily Hester MRN: [id number] DOB: October 12, 1970 Today's Date: 05/23/2020    History of Present Illness Pt is 50 yo female s/p R anterior THA revision on 05/21/20.  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  Increased ambulation distance and gait quality.  Pt with improving pain control and more AROM with exercises.  Demonstrates mobility necessary to return home from PT perspective, but while hospitalized will continue to benefit from PT to advance mobility and independence.    Follow Up Recommendations  Follow surgeon's recommendation for DC plan and follow-up therapies;Supervision/Assistance - 24 hour     Equipment Recommendations  None recommended by PT    Recommendations for Other Services       Precautions / Restrictions Precautions Precautions: Fall Restrictions RLE Weight Bearing: Weight bearing as tolerated    Mobility  Bed Mobility Overal bed mobility: Needs Assistance Bed Mobility: Supine to Sit     Supine to sit: Min assist     General bed mobility comments: Increased time, cues for sequencing, assist for R LE but was able to manage trunk herself    Transfers Overall transfer level: Needs assistance Equipment used: Rolling walker (2 wheeled) Transfers: Sit to/from Stand Sit to Stand: Min guard         General transfer comment: Min guard for safety; did not need cues for safe hand placement or R LE management.  Performed x 33from bed x1 from [facility] and x 2 from therapy mat  Ambulation/Gait Ambulation/Gait assistance: Min guard Gait Distance (Feet): 80 Feet (80'x2) Assistive device: Rolling walker (2 wheeled) Gait Pattern/deviations: Decreased stride length;Decreased weight shift to right;Decreased stance time - right;Step-through pattern Gait velocity: decreased but improving   General Gait Details: Min guard for safety; cues for sequence  and posture and RW proximity; progressed to step through pattern   Stairs   Stairs assistance: Min guard Stair Management: One rail Right;Step to pattern;Sideways Number of Stairs: 2 General stair comments: Pt went up/down 2 steps with rail and R.  Demonstrated and educated on sequencing.  Performed slowly with min guard.   Wheelchair Mobility    Modified Rankin (Stroke Patients Only)       Balance Overall balance assessment: Needs assistance Sitting-balance support: No upper extremity supported Sitting balance-Leahy Scale: Good     Standing balance support: Bilateral upper extremity supported;No upper extremity supported Standing balance-Leahy Scale: Fair Standing balance comment: RW for ambulation but can static stand wtihout support                            Cognition Arousal/Alertness: Awake/alert Behavior During Therapy: WFL for tasks assessed/performed Overall Cognitive Status: Within Functional Limits for tasks assessed                                        Exercises Total Joint Exercises Ankle Circles/Pumps: AROM;Both;10 reps;Seated Quad Sets: AROM;Both;Supine;10 reps Heel Slides: AAROM;Right;Supine;10 reps Hip ABduction/ADduction: AAROM;Right;Supine;10 reps Long Arc Quad: AROM;Right;Seated;10 reps    General Comments General comments (skin integrity, edema, etc.): VSS on RA when awake (placed back on 2 LPM O2 as pt planning to go to sleep)      Pertinent Vitals/Pain Pain Assessment: 0-10 Pain Score: 7  Pain Location: R hip  Pain Descriptors / Indicators: Discomfort;Sharp;Grimacing Pain Intervention(s): Limited activity within patient's tolerance;Monitored during session;Premedicated before session;Ice applied    Home Living                      Prior Function            PT Goals (current goals can now be found in the care plan section) Acute Rehab PT Goals Patient Stated Goal: return home; decrease pain PT  Goal Formulation: With patient/family Time For Goal Achievement: 06/04/20 Potential to Achieve Goals: Good Progress towards PT goals: Progressing toward goals    Frequency    7X/week      PT Plan Current plan remains appropriate    Co-evaluation              AM-PAC PT "6 Clicks" Mobility   Outcome Measure  Help needed turning from your back to your side while in a flat bed without using bedrails?: A Little Help needed moving from lying on your back to sitting on the side of a flat bed without using bedrails?: A Little Help needed moving to and from a bed to a chair (including a wheelchair)?: A Little Help needed standing up from a chair using your arms (e.g., wheelchair or bedside chair)?: A Little Help needed to walk in hospital room?: A Little Help needed climbing 3-5 steps with a railing? : A Little 6 Click Score: 18    End of Session Equipment Utilized During Treatment: Gait belt Activity Tolerance: Patient tolerated treatment well Patient left: with call bell/phone within reach;with family/visitor present;in chair Nurse Communication: Mobility status PT Visit Diagnosis: Other abnormalities of gait and mobility (R26.89);Muscle weakness (generalized) (M62.81)     Time: 1020-1050 PT Time Calculation (min) (ACUTE ONLY): 30 min  Charges:  $Gait Training: 8-22 mins $Therapeutic Exercise: 8-22 mins                     Anise Salvo, PT Acute Rehab Services Pager [phone] [facility] [phone]     Emily Hester 05/23/2020, 11:03 AM

## 2020-05-23 NOTE — Progress Notes (Signed)
PRNs given, patient reassessed, HR and temp. decreased    05/22/20 2100  Assess: MEWS Score  Temp (!) 100.6 F (38.1 C)  BP 123/69  Pulse Rate (!) 120  Resp 18  Level of Consciousness Alert  SpO2 96 %  O2 Device Room Air  Assess: MEWS Score  MEWS Temp 1  MEWS Systolic 0  MEWS Pulse 2  MEWS RR 0  MEWS LOC 0  MEWS Score 3  MEWS Score Color Yellow  Assess: if the MEWS score is Yellow or Red  Were vital signs taken at a resting state? Yes  Focused Assessment No change from prior assessment  Early Detection of Sepsis Score *See Row Information* Medium  MEWS guidelines implemented *See Row Information* Yes  Treat  MEWS Interventions Administered scheduled meds/treatments  Patients response to intervention Effective  Take Vital Signs  Increase Vital Sign Frequency  Yellow: Q 2hr X 2 then Q 4hr X 2, if remains yellow, continue Q 4hrs  Escalate  MEWS: Escalate Yellow: discuss with charge nurse/RN and consider discussing with provider and RRT  Notify: Charge Nurse/RN  Name of Charge Nurse/RN Notified Francesca Jewett  Date Charge Nurse/RN Notified 05/22/20  Time Charge Nurse/RN Notified 2115  Notify: Provider  Provider Name/Title Dr. Cleophas Dunker  Date Provider Notified 05/22/20  Time Provider Notified 2142  Notification Type Call  Notification Reason Other (Comment) (elevated HR)  Provider response Other (Comment) (continue with PRNs)  Date of Provider Response 05/22/20  Time of Provider Response 2200  Notify: Rapid Response  Name of Rapid Response RN Notified  (N/A)  Document  Patient Outcome Stabilized after interventions  Progress note created (see row info) Yes

## 2020-05-23 NOTE — Progress Notes (Signed)
Subjective: 3 Days Post-Op Procedure(s) (LRB): RIGHT TOTAL HIP REVISION- ploy exhange and hip ball exchange with bone graft (Right) Patient reports pain as moderate.  Slowly improving mobility with PT.  Objective: Vital signs in last 24 hours: Temp:  [97.7 F (36.5 C)-100.6 F (38.1 C)] 98.1 F (36.7 C) (03/11 0451) Pulse Rate:  [96-120] 96 (03/11 0451) Resp:  [17-19] 17 (03/11 0451) BP: (112-123)/(68-75) 116/72 (03/11 0451) SpO2:  [94 %-97 %] 97 % (03/11 0451)  Intake/Output from previous day: 03/10 0701 - 03/11 0700 In: -  Out: 101 [Urine:101] Intake/Output this shift: Total I/O In: -  Out: 1 [Urine:1]  Recent Labs    05/20/20 1154 05/21/20 0236  HGB 13.6 11.1*   Recent Labs    05/20/20 1154 05/21/20 0236  WBC 9.3 10.9*  RBC 5.11 4.09  HCT 42.6 34.0*  PLT 298 264   Recent Labs    05/20/20 1154 05/21/20 0236  NA 136 135  K 4.0 4.0  CL 100 104  CO2 23 21*  BUN 9 8  CREATININE 0.72 0.70  GLUCOSE 108* 151*  CALCIUM 9.3 8.5*   No results for input(s): LABPT, INR in the last 72 hours.  Sensation intact distally Intact pulses distally Dorsiflexion/Plantar flexion intact Incision: dressing C/D/I   Assessment/Plan: 3 Days Post-Op Procedure(s) (LRB): RIGHT TOTAL HIP REVISION- ploy exhange and hip ball exchange with bone graft (Right) Up with therapy Plan for discharge tomorrow Discharge home with home health      Kathryne Hitch 05/23/2020, 6:38 AM

## 2020-05-24 LAB — GLUCOSE, CAPILLARY: Glucose-Capillary: 102 mg/dL — ABNORMAL HIGH (ref 70–99)

## 2020-05-24 NOTE — Progress Notes (Signed)
Discharge summary packet provided to pt with instructions. Pt verbalized understanding of instructions. No complaints. Pt D/C to home as ordered. Per pt her spouse would be responsible for her transport back home.

## 2020-05-24 NOTE — Discharge Summary (Signed)
Patient ID: Emily Hester MRN: [id number] DOB/AGE: 21-Jun-1970 50 y.o.  Admit date: 05/20/2020 Discharge date: 05/24/2020  Admission Diagnoses:  Principal Problem:   Loose right total hip arthroplasty [facility]) Active Problems:   Status post revision of total hip   Discharge Diagnoses:  Same  Past Medical History:  Diagnosis Date  . Arthritis    patient denies  . Asthma   . Diabetes mellitus without complication (HCC)    type   . GERD (gastroesophageal reflux disease)   . Hypertension   . Interstitial cystitis   . IUD (intrauterine device) in place placed 6 weeks ago  . Migraines    MIgraines- not current    . Pneumonia     Surgeries: Procedure(s): RIGHT TOTAL HIP REVISION- ploy exhange and hip ball exchange with bone graft on 05/20/2020   Consultants:   Discharged Condition: Improved  Hospital Course: Emily Hester is an 50 y.o. female who was admitted 05/20/2020 for operative treatment ofLoose total hip arthroplasty (HCC). Patient has severe unremitting pain that affects sleep, daily activities, and work/hobbies. After pre-op clearance the patient was taken to the operating room on 05/20/2020 and underwent  Procedure(s): RIGHT TOTAL HIP REVISION- ploy exhange and hip ball exchange with bone graft.    Patient was given perioperative antibiotics:  Anti-infectives (From admission, onward)   Start     Dose/Rate Route Frequency Ordered Stop   05/20/20 2100  vancomycin (VANCOREADY) IVPB 1000 mg/200 mL        1,000 mg 200 mL/hr over 60 Minutes Intravenous Every 12 hours 05/20/20 2005 05/21/20 0015       Patient was given sequential compression devices, early ambulation, and chemoprophylaxis to prevent DVT.  Patient benefited maximally from hospital stay and there were no complications.    Recent vital signs:  Patient Vitals for the past 24 hrs:  BP Temp Temp src Pulse Resp SpO2  05/24/20 0335 120/70 98 F (36.7 C) - (!) 101 18 98 %  05/23/20 2040 124/64 98.2 F  (36.8 C) - (!) 106 18 97 %  05/23/20 2040 - - - - - 98 %  05/23/20 1425 123/72 98.9 F (37.2 C) Oral (!) 109 17 100 %  05/23/20 0824 - - - - - 96 %     Recent laboratory studies:  Recent Labs    05/23/20 0729  WBC 12.4*  HGB 9.1*  HCT 29.3*  PLT 237     Discharge Medications:   Allergies as of 05/24/2020      Reactions   Amoxicillin Hives, Swelling, Other (See Comments)   Has patient had a PCN reaction causing immediate rash, facial/tongue/throat swelling, SOB or lightheadedness with hypotension: Yes Has patient had a PCN reaction causing severe rash involving mucus membranes or skin necrosis: No Has patient had a PCN reaction that required hospitalization: Yes Has patient had a PCN reaction occurring within the last 10 years: No  If all of the above answers are "NO", then may proceed with Cephalosporin use.   Lisinopril Itching, Cough   Heart racing    Olmesartan    jittery   Bactrim Rash      Medication List    STOP taking these medications   HYDROcodone-acetaminophen 7.5-325 MG tablet Commonly known as: NORCO     TAKE these medications   acetaminophen 500 MG tablet Commonly known as: TYLENOL Take 1,000 mg by mouth every 6 (six) hours as needed for moderate pain.   albuterol 108 (90 Base) MCG/ACT inhaler Commonly known  as: VENTOLIN HFA Inhale 2 puffs into the lungs every 4 (four) hours as needed for wheezing or shortness of breath.   amLODipine 10 MG tablet Commonly known as: NORVASC Take 10 mg by mouth every morning.   aspirin 81 MG chewable tablet Chew 1 tablet (81 mg total) by mouth 2 (two) times daily.   aspirin-acetaminophen-caffeine 250-250-65 MG tablet Commonly known as: EXCEDRIN MIGRAINE Take 2 tablets by mouth every 6 (six) hours as needed for headache.   cetirizine 10 MG tablet Commonly known as: ZYRTEC Take 1 tablet (10 mg total) by mouth daily.   clindamycin 150 MG capsule Commonly known as: CLEOCIN Take 600 mg by mouth See admin  instructions. Take 600 mg 1 hour prior to dental work   diphenhydrAMINE 25 MG tablet Commonly known as: BENADRYL Take 25 mg by mouth daily as needed for allergies.   Fluticasone-Salmeterol 250-50 MCG/DOSE Aepb Commonly known as: Wixela Inhub Inhale 1 puff into the lungs 2 (two) times daily.   Advair HFA 115-21 MCG/ACT inhaler Generic drug: fluticasone-salmeterol Inhale 2 puffs into the lungs 2 (two) times daily.   ibuprofen 200 MG tablet Commonly known as: ADVIL Take 600 mg by mouth every 6 (six) hours as needed for headache or moderate pain.   ipratropium 0.06 % nasal spray Commonly known as: ATROVENT Place 2 sprays into both nostrils 3 (three) times daily. What changed:   when to take this  reasons to take this   levonorgestrel 20 MCG/24HR IUD Commonly known as: MIRENA 1 each by Intrauterine route once.   lidocaine 5 % Commonly known as: LIDODERM PLACE 1 PATCH ONTO THE SKIN DAILY. REMOVE & DISCARD PATCH WITHIN 12 HOURS OR AS DIRECTED BY MD What changed:   when to take this  reasons to take this   losartan 25 MG tablet Commonly known as: COZAAR Take 25 mg by mouth daily.   metFORMIN 750 MG 24 hr tablet Commonly known as: GLUCOPHAGE-XR Take 750 mg by mouth 2 (two) times daily.   montelukast 10 MG tablet Commonly known as: SINGULAIR Take 1 tablet (10 mg total) by mouth at bedtime.   olopatadine 0.1 % ophthalmic solution Commonly known as: PATANOL Place 1 drop into both eyes daily as needed for allergies.   omeprazole 20 MG capsule Commonly known as: PRILOSEC Take 1 capsule (20 mg total) by mouth every morning. What changed: when to take this   ondansetron 4 MG disintegrating tablet Commonly known as: Zofran ODT Take 1 tablet (4 mg total) by mouth every 8 (eight) hours as needed for nausea or vomiting.   ondansetron 8 MG tablet Commonly known as: ZOFRAN Take 8 mg by mouth every 8 (eight) hours as needed for nausea or vomiting.   OneTouch Verio test  strip Generic drug: glucose blood as directed.   oxyCODONE 5 MG immediate release tablet Commonly known as: Oxy IR/ROXICODONE Take 1-2 tablets (5-10 mg total) by mouth every 4 (four) hours as needed for moderate pain (pain score 4-6).   rosuvastatin 10 MG tablet Commonly known as: CRESTOR Take 10 mg by mouth daily.   Steglatro 15 MG Tabs tablet Generic drug: ertugliflozin L-PyroglutamicAc Take 15 mg by mouth daily.   tiZANidine 4 MG tablet Commonly known as: ZANAFLEX Take 1 tablet (4 mg total) by mouth every 6 (six) hours as needed for muscle spasms. What changed:   when to take this  reasons to take this   Trulicity 1.5 MG/0.5ML Sopn Generic drug: Dulaglutide Inject 1.5 mg into the skin  every Tuesday.   Vitamin D 50 MCG (2000 UT) tablet Take 2,000 Units by mouth daily.            Durable Medical Equipment  (From admission, onward)         Start     Ordered   05/20/20 1740  DME 3 n 1  Once        05/20/20 1739   05/20/20 1740  DME Walker rolling  Once       Question Answer Comment  Walker: With 5 Inch Wheels   Patient needs a walker to treat with the following condition Status post revision of total hip      05/20/20 1739          Diagnostic Studies: DG Pelvis Portable  Result Date: 05/20/2020 CLINICAL DATA:  Status post right hip replacement revision EXAM: PORTABLE PELVIS 1-2 VIEWS COMPARISON:  11/23/2017 FINDINGS: Right hip arthroplasty in expected alignment. Femoral stem is midline. No periprosthetic lucency or fracture. Edema in the soft tissues related to recent surgery. Upper pelvis not included in the field of view. IMPRESSION: Right hip arthroplasty without immediate postoperative complication. Electronically Signed   By: Narda Rutherford M.D.   On: 05/20/2020 19:57   DG C-Arm 1-60 Min  Result Date: 05/20/2020 CLINICAL DATA:  Right total hip revision. EXAM: DG C-ARM 1-60 MIN; OPERATIVE RIGHT HIP WITH PELVIS FLUOROSCOPY TIME:  Fluoroscopy Time:  7  seconds Radiation Exposure Index (if provided by the fluoroscopic device): 1.01 mGy Number of Acquired Spot Images: 5 COMPARISON:  10/03/2019 FINDINGS: Five fluoroscopic spot views of the right hip obtained in the operating room in frontal projection. Right hip arthroplasty in place. IUD in the pelvis. IMPRESSION: Procedural fluoroscopy during right hip revision. Electronically Signed   By: Narda Rutherford M.D.   On: 05/20/2020 17:04   DG HIP OPERATIVE UNILAT WITH PELVIS RIGHT  Result Date: 05/20/2020 CLINICAL DATA:  Right total hip revision. EXAM: DG C-ARM 1-60 MIN; OPERATIVE RIGHT HIP WITH PELVIS FLUOROSCOPY TIME:  Fluoroscopy Time:  7 seconds Radiation Exposure Index (if provided by the fluoroscopic device): 1.01 mGy Number of Acquired Spot Images: 5 COMPARISON:  10/03/2019 FINDINGS: Five fluoroscopic spot views of the right hip obtained in the operating room in frontal projection. Right hip arthroplasty in place. IUD in the pelvis. IMPRESSION: Procedural fluoroscopy during right hip revision. Electronically Signed   By: Narda Rutherford M.D.   On: 05/20/2020 17:04    Disposition: Discharge disposition: 01-Home or Self Care          Follow-up Information    Kathryne Hitch, MD Follow up in 2 week(s).   Specialty: Orthopedic Surgery Contact information: [address] [place] [address] [phone]                Signed: Kathryne Hitch 05/24/2020, 8:06 AM

## 2020-05-24 NOTE — Plan of Care (Signed)
Patient is s/p right total hip arthroplasty by Dr. Magnus Ivan on 3/8. Aquacel to right hip clean dry and intact. Ice pack to right hip and SCDs to bilateral lower extremities. Up with standby assist to bedside commode and walker. Still no BM since prior to admission. Pain controlled with prn meds as ordered. Discharge home tomorrow with home health. Will continue to monitor and continue current POC.

## 2020-05-24 NOTE — TOC Initial Note (Addendum)
Transition of Care [facility]) - Initial/Assessment Note    Patient Details  Name: Emily Hester MRN: [id number] Date of Birth: [birth year]  Transition of Care [facility]) CM/SW Contact:    Janae Bridgeman, RN Phone Number: 05/24/2020, 12:45 PM  Clinical Narrative:                 Case management called and spoke with the patient on the phone in regards to transitions of care.  The patient is a S/P total hip revision and has all needed dme at home.  I gave her choice regarding home health services, since [facility] was unable to provide home health since they are out of net work with [place].  The patient states that she had [facility] with her first hip surgery with Dr. Magnus Ivan, and was happy with their services.  I called and spoke with Barbara Cower, CM with [facility] on the phone and he will follow up for home health services for therapy needs.  The patient's husband is driving her home today after discharge.  05/24/2020 1320 - [facility] called back and they are unable to provide home health services, unfortunately. I will let Dr. Eliberto Ivory office be aware  if I am unable to find home health due to her primary insurance being the barrier to home health services.  05/24/2020 - 1325 - I spoke with Delane Ginger, PA and made him aware that I was unable to find home health services.  He states that he will call the office on Monday and have the office RN set the patient up for outpatient therapy.  Expected Discharge Plan: Home w Home Health Services Barriers to Discharge: No Barriers Identified   Patient Goals and CMS Choice Patient states their goals for this hospitalization and ongoing recovery are:: Patient hopes to go home today. CMS Medicare.gov Compare Post Acute Care list provided to:: Patient Choice offered to / list presented to : Patient  Expected Discharge Plan and Services Expected Discharge Plan: Home w Home Health Services   Discharge Planning Services: CM Consult Post  Acute Care Choice: Home Health Living arrangements for the past 2 months: Single Family Home Expected Discharge Date: 05/24/20                         HH Arranged: PT,OT HH Agency: [facility] Date HH Agency Contacted: 05/24/20 Time HH Agency Contacted: 1244 Representative spoke with at [facility] Agency: Barbara Cower, CM with [facility] is checking on [facility] availability.  Prior Living Arrangements/Services Living arrangements for the past 2 months: Single Family Home Lives with:: Spouse Patient language and need for interpreter reviewed:: Yes Do you feel safe going back to the place where you live?: Yes      Need for Family Participation in Patient Care: Yes (Comment) Care giver support system in place?: Yes (comment) Current home services: DME (has rolling walker and 3:1 at home.) Criminal Activity/Legal Involvement Pertinent to Current Situation/Hospitalization: No - Comment as needed  Activities of Daily Living Home Assistive Devices/Equipment: CBG Meter ADL Screening (condition at time of admission) Patient's cognitive ability adequate to safely complete daily activities?: Yes Is the patient deaf or have difficulty hearing?: No Does the patient have difficulty seeing, even when wearing glasses/contacts?: No Does the patient have difficulty concentrating, remembering, or making decisions?: No Patient able to express need for assistance with ADLs?: No Does the patient have difficulty dressing or bathing?: No Independently performs ADLs?: No  Communication: Independent Dressing (OT): Independent Grooming: Independent Feeding: Independent Bathing: Independent Toileting: Independent In/Out Bed: Needs assistance Is this a change from baseline?: Change from baseline, expected to last >3 days Walks in Home: Needs assistance Is this a change from baseline?: Change from baseline, expected to last >3 days Does the patient have difficulty walking or climbing stairs?:  Yes Weakness of Legs: Both Weakness of Arms/Hands: None  Permission Sought/Granted Permission sought to share information with : Case Manager Permission granted to share information with : Yes, Verbal Permission Granted     Permission granted to share info w AGENCY: Home Health  Permission granted to share info w Relationship: spouse - [name] - [phone]     Emotional Assessment Appearance:: Appears stated age Attitude/Demeanor/Rapport: Engaged Affect (typically observed): Accepting Orientation: : Oriented to Self,Oriented to Place,Oriented to  Time,Oriented to Situation Alcohol / Substance Use: Not Applicable Psych Involvement: No (comment)  Admission diagnosis:  Status post revision of total hip [Z96.649] Patient Active Problem List   Diagnosis Date Noted  . 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 Status post revision of total hip 05/20/2020  . Trochanteric bursitis, right hip 09/05/2019  . 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  . Unilateral primary osteoarthritis, right hip 01/27/2017   PCP:  Merri Brunette, MD Pharmacy:   [facility] [address], [place] - [address] [place] [address] Phone: [phone] Fax: [phone]     Social Determinants of Health (SDOH) Interventions    Readmission Risk Interventions Readmission Risk Prevention Plan 05/24/2020  Post Dischage Appt Complete  Medication Screening Complete  Transportation Screening Complete  Some recent data might be hidden

## 2020-05-24 NOTE — Progress Notes (Signed)
PT Cancellation Note  Patient Details Name: Emily Hester MRN: [id number] DOB: 10/09/1970   Cancelled Treatment:    Reason Eval/Treat Not Completed: (P) Other (comment) (eating breakfast will return for PT treatment to prepare for d/c home today.)   Florestine Avers 05/24/2020, 9:23 AM  Bonney Leitz , PTA Acute Rehabilitation Services Pager [phone] Office [phone]

## 2020-05-24 NOTE — Discharge Instructions (Signed)
Increase her activities as comfort allows. You may put all of your weight on your right hip as comfort allows. Keep your dressing over your right hip incision. You may get the dressing wet daily in the shower. You have extra dressing to change if needed. You can also take 3-4 Advil up to 3 times a day as needed with meals for inflammation and pain.

## 2020-05-24 NOTE — Progress Notes (Signed)
Patient ID: Emily Hester, female   DOB: [birth year], 50 y.o.   MRN: [id number] The patient's vital signs are stable and her right hip is stable.  Her labs are also stable from yesterday.  She has slight acute blood loss anemia but is asymptomatic from that.  Her dressing is clean and intact on the right hip.  She can be discharged today to home.

## 2020-05-24 NOTE — Progress Notes (Signed)
Physical Therapy Treatment Patient Details Name: Emily Hester MRN: [id number] DOB: 02-Nov-1970 Today's Date: 05/24/2020    History of Present Illness Pt is 50 yo female s/p R anterior THA revision on 05/21/20.  She has PMH of R THA 12/18, DM, HTN, GERD, and arthirits    PT Comments    Performed gt training and reviewed HEP this session.  Pt eager to return home.     Follow Up Recommendations  Follow surgeon's recommendation for DC plan and follow-up therapies;Supervision/Assistance - 24 hour     Equipment Recommendations  None recommended by PT    Recommendations for Other Services       Precautions / Restrictions Precautions Precautions: Fall Restrictions Weight Bearing Restrictions: Yes RLE Weight Bearing: Weight bearing as tolerated    Mobility  Bed Mobility Overal bed mobility: Needs Assistance Bed Mobility: Supine to Sit     Supine to sit: Min assist     General bed mobility comments: Min assistance to advance RLE to edge of bed.    Transfers Overall transfer level: Needs assistance Equipment used: Rolling walker (2 wheeled) Transfers: Sit to/from Stand Sit to Stand: Supervision         General transfer comment: Cues for hand placement and rocking momentum to rise into standing.  Ambulation/Gait Ambulation/Gait assistance: Min guard Gait Distance (Feet): 120 Feet Assistive device: Rolling walker (2 wheeled) Gait Pattern/deviations: Decreased stride length;Decreased weight shift to right;Decreased stance time - right;Step-through pattern Gait velocity: decreased but improving   General Gait Details: Cues for RW safety and posture.  remains to perform step through pattern.   Stairs             Wheelchair Mobility    Modified Rankin (Stroke Patients Only)       Balance Overall balance assessment: Needs assistance Sitting-balance support: No upper extremity supported Sitting balance-Leahy Scale: Good       Standing balance-Leahy  Scale: Fair                              Cognition Arousal/Alertness: Awake/alert Behavior During Therapy: WFL for tasks assessed/performed Overall Cognitive Status: Within Functional Limits for tasks assessed                                        Exercises General Exercises - Lower Extremity Ankle Circles/Pumps: AROM;Both;20 reps;Supine Quad Sets: AROM;Right;10 reps;Supine Short Arc Quad: AROM;Right;10 reps;Supine Heel Slides: AAROM;Right;10 reps;Supine Hip ABduction/ADduction: AAROM;Right;10 reps;Supine    General Comments        Pertinent Vitals/Pain Pain Assessment: 0-10 Pain Location: R hip Pain Descriptors / Indicators: Discomfort;Sharp;Grimacing Pain Intervention(s): Monitored during session;Repositioned    Home Living                      Prior Function            PT Goals (current goals can now be found in the care plan section) Acute Rehab PT Goals Patient Stated Goal: return home; decrease pain Potential to Achieve Goals: Good Progress towards PT goals: Progressing toward goals    Frequency    7X/week      PT Plan Current plan remains appropriate    Co-evaluation              AM-PAC PT "6 Clicks" Mobility   Outcome Measure  Help needed  turning from your back to your side while in a flat bed without using bedrails?: A Little Help needed moving from lying on your back to sitting on the side of a flat bed without using bedrails?: A Little Help needed moving to and from a bed to a chair (including a wheelchair)?: A Little Help needed standing up from a chair using your arms (e.g., wheelchair or bedside chair)?: A Little Help needed to walk in hospital room?: A Little Help needed climbing 3-5 steps with a railing? : A Little 6 Click Score: 18    End of Session Equipment Utilized During Treatment: Gait belt Activity Tolerance: Patient tolerated treatment well Patient left: with call bell/phone within  reach;with family/visitor present;with chair alarm set;in chair Nurse Communication: Mobility status PT Visit Diagnosis: Other abnormalities of gait and mobility (R26.89);Muscle weakness (generalized) (M62.81)     Time: 9833-8250 PT Time Calculation (min) (ACUTE ONLY): 23 min  Charges:  $Gait Training: 8-22 mins $Therapeutic Exercise: 8-22 mins                     Emily Hester , PTA Acute Rehabilitation Services Pager [phone] Office [phone]     Emily Hester 05/24/2020, 10:32 AM

## 2020-05-26 ENCOUNTER — Other Ambulatory Visit: Payer: Self-pay | Admitting: *Deleted

## 2020-05-26 DIAGNOSIS — Z96649 Presence of unspecified artificial hip joint: Secondary | ICD-10-CM

## 2020-05-26 DIAGNOSIS — Z96641 Presence of right artificial hip joint: Secondary | ICD-10-CM

## 2020-05-26 DIAGNOSIS — M1611 Unilateral primary osteoarthritis, right hip: Secondary | ICD-10-CM

## 2020-05-26 DIAGNOSIS — T84038A Mechanical loosening of other internal prosthetic joint, initial encounter: Secondary | ICD-10-CM

## 2020-05-26 NOTE — Progress Notes (Signed)
Verbal order from PA to arrange OPPT due to HHPT unable to schedule.

## 2020-05-29 ENCOUNTER — Telehealth: Payer: Self-pay | Admitting: Orthopaedic Surgery

## 2020-05-29 ENCOUNTER — Ambulatory Visit: Payer: Managed Care, Other (non HMO) | Attending: Orthopaedic Surgery

## 2020-05-29 ENCOUNTER — Other Ambulatory Visit: Payer: Self-pay

## 2020-05-29 ENCOUNTER — Other Ambulatory Visit: Payer: Self-pay | Admitting: Physician Assistant

## 2020-05-29 ENCOUNTER — Inpatient Hospital Stay: Payer: Managed Care, Other (non HMO) | Admitting: Physician Assistant

## 2020-05-29 DIAGNOSIS — R262 Difficulty in walking, not elsewhere classified: Secondary | ICD-10-CM | POA: Diagnosis present

## 2020-05-29 DIAGNOSIS — R2689 Other abnormalities of gait and mobility: Secondary | ICD-10-CM | POA: Insufficient documentation

## 2020-05-29 DIAGNOSIS — M25551 Pain in right hip: Secondary | ICD-10-CM | POA: Insufficient documentation

## 2020-05-29 DIAGNOSIS — M25651 Stiffness of right hip, not elsewhere classified: Secondary | ICD-10-CM | POA: Diagnosis present

## 2020-05-29 DIAGNOSIS — M6281 Muscle weakness (generalized): Secondary | ICD-10-CM | POA: Insufficient documentation

## 2020-05-29 MED ORDER — OXYCODONE HCL 5 MG PO TABS: 5.0000 mg | ORAL_TABLET | ORAL | 0 refills | Status: DC | PRN

## 2020-05-29 NOTE — Telephone Encounter (Signed)
Pt was called and informed

## 2020-05-29 NOTE — Telephone Encounter (Signed)
Patient called needing Rx refilled Oxycodone 5 mg. The number to contact patient is [phone]

## 2020-05-29 NOTE — Telephone Encounter (Signed)
Sent in

## 2020-05-30 NOTE — Therapy (Signed)
[facility] [address] [place], [place], [address] Phone: [phone]   Fax:  [phone]  Physical Therapy Evaluation  Patient Details  Name: Emily Hester MRN: [id number] Date of Birth: 10/19/1970 Referring Provider (PT): Kathryne Hitch, MD   Encounter Date: 05/29/2020   PT End of Session - 05/29/20 1615    Visit Number 1    Number of Visits 17    Date for PT Re-Evaluation 07/26/20    Authorization Type [employer] - FOTO visit 6 and visit 10    PT Start Time 1615    PT Stop Time 1703    PT Time Calculation (min) 48 min    Equipment Utilized During Treatment Other (comment)   manual wheelchair car to/from therapy gym; RW for transfers and gait training   Activity Tolerance Patient tolerated treatment well    Behavior During Therapy [facility] for tasks assessed/performed           Past Medical History:  Diagnosis Date  . Arthritis    patient denies  . Asthma   . Diabetes mellitus without complication (HCC)    type   . GERD (gastroesophageal reflux disease)   . Hypertension   . Interstitial cystitis   . IUD (intrauterine device) in place placed 6 weeks ago  . Migraines    MIgraines- not current    . Pneumonia     Past Surgical History:  Procedure Laterality Date  . ANTERIOR HIP REVISION Right 05/20/2020   Procedure: RIGHT TOTAL HIP REVISION- ploy exhange and hip ball exchange with bone graft;  Surgeon: Kathryne Hitch, MD;  Location: [facility] OR;  Service: Orthopedics;  Laterality: Right;  . DILATION AND EVACUATION  09/30/2011   Procedure: DILATATION AND EVACUATION;  Surgeon: Shela Andrea, MD;  Location: [facility] ORS;  Service: Gynecology;  Laterality: N/A;  . left knee surgery      arthroscopy   . TOTAL HIP ARTHROPLASTY Right 03/11/2017   Procedure: RIGHT TOTAL HIP ARTHROPLASTY ANTERIOR APPROACH;  Surgeon: Kathryne Hitch, MD;  Location: [facility] ORS;  Service: Orthopedics;  Laterality: Right;    There  were no vitals filed for this visit.    Subjective Assessment - 05/29/20 1620    Subjective "I had surgery for R hip bursitis last year. When he did the revision, he did something more with the bursa while he was in there. I never got better or stronger after the hip replacement in December 2018" Pt reports her hip has been "sore" since the revision 05/20/2020.    Patient is accompained by: Family member   husband brought pt halfway to therapy gym   Limitations Sitting;Walking;Standing;House hold activities    How long can you sit comfortably? "I readjust every 10-15 minutes"    How long can you stand comfortably? "I get tired going to the bathroom and back to bed" "I couldn't sit or stand for long before the revision"    How long can you walk comfortably? "I get tired going to the bathroom and back to bed" "I couldn't sit or stand for long before the revision"    Patient Stated Goals walking normally, cooking    Currently in Pain? Yes    Pain Score 7     Pain Location Hip    Pain Orientation Right;Lateral    Pain Descriptors / Indicators Discomfort    Pain Type Surgical pain    Pain Radiating Towards stinging pain in anterior R thigh    Pain  Onset 1 to 4 weeks ago    Pain Frequency Constant    Aggravating Factors  walking    Pain Relieving Factors ice, pain medication (oxycodone), home exercises    Effect of Pain on Daily Activities getting dressed (pants, socks) - "I still can't lift my leg"; getting in/out of bed "because I have a high bed" (pt has a step stool at the bed"              [facility] PT Assessment - 05/30/20 0001      Assessment   Medical Diagnosis 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), Loose total hip arthroplasty, initial encounter (HCC) (O13.086V, Z96.649), Status post revision of total hip (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  Yes    Is the patient reluctant to leave their home because of a fear of falling?  Yes      Home Environment   Living Environment Private residence    Living Arrangements Spouse/significant other   husband   Available Help at Discharge Family    Type of Home House    Home Access Stairs to enter    Entrance Stairs-Number of Steps 4   4 steps then threshold to get through door   Entrance Stairs-Rails Right   R side ascending   Home Layout One level    Home Equipment Walker - 2 wheels;Cane - quad;Bedside commode   BSC is over the toilet (raised commode) which pt uses over toilet and uses in shower as well     Prior Function   Level of Independence Independent with household mobility with device    Vocation On disability    Leisure "lately it hasn't been anything. the pain has been too bad"      Cognition   Overall Cognitive Status Within Functional Limits for tasks assessed      Observation/Other Assessments   Focus on Therapeutic Outcomes (FOTO)  8% function; predicted 39% function      AROM   Overall AROM Comments AAROM R hip FL to 70 degrees during heel slides with green strap. Continued soreness and surgical pain with movements.      Strength   Overall Strength Comments not tested at this time secondary to right total hip revision 05/20/2020                      Objective measurements completed on examination: See above findings.       [facility] Adult PT Treatment/Exercise - 05/30/20 0001      Transfers   Comments Car transfer at end of session into  SUV with min to mod assist to lift RLE into car      Ambulation/Gait   Ambulation/Gait Yes    Ambulation/Gait Assistance 6: Modified independent (  Device/Increase time);Other (comment)   standby assist   Ambulation Distance (Feet) 44 Feet    Assistive device Rolling walker    Gait Pattern Step-to pattern    Gait Comments Educated regarding step-to pattern sequence with RW. Pt reports feeling more comfortable with step-to pattern as she has been ambulating with step-through pattern but feels like she has been pushing through UE excessively and has not been as comfortable. RW adjusted to appropriate height      Self-Care   Self-Care Other Self-Care Comments    Other Self-Care Comments  See patient education      Exercises   Exercises Knee/Hip      Knee/Hip Exercises: Seated   Other Seated Knee/Hip Exercises longsitting glute set and isometric hip ADD (ball squeeze) x 10      Knee/Hip Exercises: Supine   Heel Slides AAROM;Right;10 reps    Heel Slides Limitations longsitting with green strap within pain free range; 5 second hold at end range flexion                  PT Education - 05/29/20 1728    Education Details Reviewed initial HEP, precaution to avoid standing hip ABD per MD per patient, FOTO and potential progress with skilled PT, gait training and pattern with RW, POC    Person(s) Educated Patient    Methods Explanation;Demonstration;Tactile cues;Verbal cues;Handout    Comprehension Verbalized understanding;Returned demonstration;Verbal cues required;Tactile cues required;Need further instruction            PT Short Term Goals - 05/29/20 1734      PT SHORT TERM GOAL #1   Title Patient will be independent with initial HEP.    Baseline Provided initial HEP at evaluation 05/29/2020    Time 4    Period Weeks    Status New    Target Date 06/26/20      PT SHORT TERM GOAL #2   Title Patient will improve right hip flexion AROM to >/= 90 degrees for improved ability to  ambulate and sit upright.    Baseline 70 degrees right hip flexion AROM    Time 4    Period Weeks    Status New    Target Date 06/26/20      PT SHORT TERM GOAL #3   Title Patient will be able to ambulate >/= 150 feet indoors on level ground using RW or LRAD for improved household ambulation.    Baseline fatigue requiring to sit after ambulating 44 feet with RW in therapy gym; pt reports fatigue ambulating to/from bathroom at home    Time 4    Period Weeks    Status New    Target Date 06/26/20      PT SHORT TERM GOAL #4   Title Patient will be able to perform standing>sit>supine transfer on mat at height of bed with minimal to no assistance with RLE.    Baseline Pt reports she uses BUE or husband helps her get in/out of bed due to being able to lift RLE and swing it onto bed at this time    Time 4    Period Weeks    Status New    Target Date 06/26/20             PT Long Term Goals - 05/30/20 1827      PT LONG TERM GOAL #1   Title Patient will be independent with advanced HEP.    Baseline Provided initial HEP at evaluation 05/29/2020  Time 8    Period Weeks    Status New    Target Date 07/24/20      PT LONG TERM GOAL #2   Title Patient will improve right hip flexion AROM to >/= 110 degrees for improved ability to ambulate, sit upright, dress herself (don/doff socks and pants), and navigate stairs.    Baseline 70 degrees right hip flexion AROM    Time 8    Period Weeks    Status New    Target Date 07/24/20      PT LONG TERM GOAL #3   Title Patient will be able to ambulate >/= 800 feet indoors on level ground using RW or LRAD before requiring seated rest break for improved community ambulation.    Baseline fatigue requiring to sit after ambulating 44 feet with RW in therapy gym; pt reports fatigue ambulating to/from bathroom at home    Time 8    Period Weeks    Status New    Target Date 07/24/20      PT LONG TERM GOAL #4   Title Patient will report being able to  don/doff pants and socks with minimal to no assistance and </= 4/10 pain.    Baseline Patient expresses significant difficulty dressing due to hip ROM limitations and surgical pain    Time 8    Period Weeks    Status New    Target Date 07/24/20      PT LONG TERM GOAL #5   Title Patient will demonstrate RLE MMT >/= 4+/5.    Baseline not tested secondary to total hip revision 05/20/2020 and limited motion/surgical pain    Time 8    Period Weeks    Status New    Target Date 07/24/20      PT LONG TERM GOAL #6   Title Patient will increase FOTO score from 8% to >/= 30% function to demonstrate improved perceived ability.    Baseline 8% function; predicted 39% function    Time 8    Period Weeks    Status New    Target Date 07/24/20                  Plan - 05/29/20 1714    Clinical Impression Statement Patient is a 50 year old female who presents to OPPT s/p right total hip revision 05/20/2020 following right hip THA anterior approach 03/11/2017. She brings RW with her to clinic but uses manual WC to transport from car to lobby/therapy gym and back to car from lobby. She demonstrates limited right hip A/AROM and pain secondary to surgery, using BUE assist and PT assist to lift RLE onto mat during evaluation. Pt was able to return demonstration of step-to 3-point gait pattern with RW after PT demonstration and verbal cues were provided for sequence, and she notes improved ability to ambulate compared to previously performing step-through gait with heavier reliance on BUE. Pt performed car transfer into high SUV using BUE assist and LLE to propel herself into seat, but she notes that her car is lower to the ground and she will most likely be riding in it to clinic due to car transfers being more difficult with this SUV. Patient will benefit from skilled PT intervention to increase right hip strength and functional mobility to allow for improved quality of life as she expresses having limitations  with desired activities since before initial THA procedure.    Personal Factors and Comorbidities Age;Comorbidity 3+;Fitness;Past/Current Experience;Time since onset of injury/illness/exacerbation  Comorbidities See PMH above    Examination-Activity Limitations Bathing;Bed Mobility;Bend;Carry;Dressing;Hygiene/Grooming;Lift;Locomotion Level;Sit;Sleep;Squat;Stairs;Stand;Toileting;Transfers    Examination-Participation Restrictions Cleaning;Community Activity;Driving;Laundry;Meal Prep;Shop    Stability/Clinical Decision Making Stable/Uncomplicated    Clinical Decision Making Low    Rehab Potential Good    PT Frequency 2x / week    PT Duration 8 weeks    PT Treatment/Interventions ADLs/Self Care Home Management;Aquatic Therapy;Electrical Stimulation;Iontophoresis 4mg /ml Dexamethasone;Moist Heat;Neuromuscular re-education;Balance training;DME Instruction;Therapeutic exercise;Therapeutic activities;Functional mobility training;Stair training;Gait training;Patient/family education;Manual techniques;Dry needling;Energy conservation;Passive range of motion;Taping    PT Next Visit Plan Assess response to initial HEP. A/AROM as tolerated, light strengthening and RLE muscle activation as tolerated, gait training, weightbearing through RLE    PT Home Exercise Plan [id number] - quad set, glute set, isometric hip ADD (ball/pillow squeeze), heel slide with strap/belt    Consulted and Agree with Plan of Care Patient           Patient will benefit from skilled therapeutic intervention in order to improve the following deficits and impairments:  Decreased activity tolerance,Decreased balance,Decreased mobility,Decreased strength,Improper body mechanics,Decreased endurance,Decreased range of motion,Difficulty walking,Pain,Decreased knowledge of use of DME  Visit Diagnosis: Pain in right hip  Stiffness of right hip, not elsewhere classified  Difficulty in walking, not elsewhere classified  Other abnormalities  of gait and mobility  Muscle weakness (generalized)     Problem List Patient Active Problem List   Diagnosis Date Noted  . 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 Status post revision of total hip 05/20/2020  . Trochanteric bursitis, right hip 09/05/2019  . 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  . Unilateral primary osteoarthritis, right hip 01/27/2017     01/29/2017, PT, DPT 05/30/20 7:44 PM  [facility] [address] [place], [place], [place] Phone: [phone]   Fax:  [phone]  Name: ALLYSE FREGEAU MRN: Burley Saver Date of Birth: 1970-12-21

## 2020-06-02 ENCOUNTER — Ambulatory Visit (INDEPENDENT_AMBULATORY_CARE_PROVIDER_SITE_OTHER): Payer: Managed Care, Other (non HMO) | Admitting: Orthopaedic Surgery

## 2020-06-02 ENCOUNTER — Encounter: Payer: Self-pay | Admitting: Orthopaedic Surgery

## 2020-06-02 DIAGNOSIS — Z96649 Presence of unspecified artificial hip joint: Secondary | ICD-10-CM

## 2020-06-02 NOTE — Progress Notes (Signed)
Emily Hester is following up 2 weeks after right hip surgery.  We did a polyliner exchange and hip ball exchange.  We did not find a femoral component to be loose.  I still placed some bone graft material around the shoulder of the hip replacement in the calcar region.  We did also lengthen her.  She was just a touch short preoperatively.  She is feeling much better overall.  She is less sensitive over the trochanteric area where she was dealing with significant chronic pain for some time now.  I did look at them as all the pain that she has been having.  Hopefully with removing scar tissue and improving her balance that this is going to help her more so in the long run.  Her right hip incision looks good.  The sutures been removed and Steri-Strips applied.  Her leg lengths are equal.  She is less sensitive palpation of the trochanteric area.  She will slowly increase her activities as comfort allows.  I agree with outpatient physical therapy for the next 2 weeks but just work on balance and coordination and no significant stressing of that hip.  I would like to see her back in 4 weeks to see how she is doing overall.  At that visit I would like a standing low AP pelvis and lateral of her right hip.

## 2020-06-03 ENCOUNTER — Ambulatory Visit: Payer: Managed Care, Other (non HMO)

## 2020-06-03 ENCOUNTER — Other Ambulatory Visit: Payer: Self-pay

## 2020-06-03 DIAGNOSIS — R262 Difficulty in walking, not elsewhere classified: Secondary | ICD-10-CM

## 2020-06-03 DIAGNOSIS — M25651 Stiffness of right hip, not elsewhere classified: Secondary | ICD-10-CM

## 2020-06-03 DIAGNOSIS — M6281 Muscle weakness (generalized): Secondary | ICD-10-CM

## 2020-06-03 DIAGNOSIS — R2689 Other abnormalities of gait and mobility: Secondary | ICD-10-CM

## 2020-06-03 DIAGNOSIS — M25551 Pain in right hip: Secondary | ICD-10-CM

## 2020-06-03 NOTE — Therapy (Signed)
[facility] Outpatient Rehabilitation [facility] [address] [place], [place], [address] Phone: [phone]   Fax:  [phone]  Physical Therapy Treatment  Patient Details  Name: Emily Hester MRN: [id number] Date of Birth: 12/03/1970 Referring Provider (PT): Kathryne Hitch, MD   Encounter Date: 06/03/2020   PT End of Session - 06/03/20 1445    Visit Number 2    Number of Visits 17    Date for PT Re-Evaluation 07/26/20    Authorization Type [employer] - FOTO visit 6 and visit 10    PT Start Time 1445    PT Stop Time 1527    PT Time Calculation (min) 42 min    Equipment Utilized During Treatment Other (comment)   manual wheelchair car to/from therapy gym; RW for transfers and gait training   Activity Tolerance Patient tolerated treatment well    Behavior During Therapy [facility] for tasks assessed/performed           Past Medical History:  Diagnosis Date  . Arthritis    patient denies  . Asthma   . Diabetes mellitus without complication (HCC)    type   . GERD (gastroesophageal reflux disease)   . Hypertension   . Interstitial cystitis   . IUD (intrauterine device) in place placed 6 weeks ago  . Migraines    MIgraines- not current    . Pneumonia     Past Surgical History:  Procedure Laterality Date  . ANTERIOR HIP REVISION Right 05/20/2020   Procedure: RIGHT TOTAL HIP REVISION- ploy exhange and hip ball exchange with bone graft;  Surgeon: Kathryne Hitch, MD;  Location: [facility];  Service: Orthopedics;  Laterality: Right;  . DILATION AND EVACUATION  09/30/2011   Procedure: DILATATION AND EVACUATION;  Surgeon: Sindee Andrea, MD;  Location: [facility];  Service: Gynecology;  Laterality: N/A;  . left knee surgery      arthroscopy   . TOTAL HIP ARTHROPLASTY Right 03/11/2017   Procedure: RIGHT TOTAL HIP ARTHROPLASTY ANTERIOR APPROACH;  Surgeon: Kathryne Hitch, MD;  Location: [facility];  Service: Orthopedics;  Laterality: Right;    There  were no vitals filed for this visit.   Subjective Assessment - 06/03/20 1448    Subjective "I've been practicing walking with the walker, but I have been getting up and walking to it."    Patient is accompained by: Family member   husband brought pt halfway to therapy gym   Limitations Sitting;Walking;Standing;House hold activities    How long can you sit comfortably? "I readjust every 10-15 minutes"    How long can you stand comfortably? "I get tired going to the bathroom and back to bed" "I couldn't sit or stand for long before the revision"    How long can you walk comfortably? "I get tired going to the bathroom and back to bed" "I couldn't sit or stand for long before the revision"    Patient Stated Goals walking normally, cooking    Currently in Pain? 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 gait also performed in parallel bars 2 x 10 feet with emphasis on heel-to-toe gait pattern as able   Assistive device Rolling walker    Gait Pattern Step-through pattern    Gait Comments Pt ambulated with step-through pattern with occasional step-to pattern this session.      Self-Care   Self-Care Other  Self-Care Comments    Other Self-Care Comments  See patient education      Knee/Hip Exercises: Standing   Other Standing Knee Exercises weightshifting ML and AP x 15 each direction    Other Standing Knee Exercises standing with narrow BOS EO x 30 sec. Standing with EC 2 x 30 sec. standing on airex 3 x 30 sec then narrow BOS on airex x 30 sec with UE support at freemotion as needed      Knee/Hip Exercises: Seated   Long Arc Quad Strengthening;Right;2 sets;10 reps    Other Seated Knee/Hip Exercises seated ball squeeze 2 x 10    Other Seated Knee/Hip Exercises seated in chair: toe raises x 20; heel raises x 20                  PT Education - 06/03/20 1642    Education Details Updated and reviewed HEP to include practicing balance activities safely at home and to continue walking within tolerance.    Person(s) Educated Patient    Methods Explanation;Demonstration;Verbal cues    Comprehension Verbalized understanding;Returned demonstration            PT Short Term Goals - 05/29/20 1734      PT SHORT TERM GOAL #1   Title Patient will be independent with initial HEP.    Baseline Provided initial HEP at evaluation 05/29/2020    Time 4    Period Weeks    Status New    Target Date 06/26/20      PT SHORT TERM GOAL #2   Title Patient will improve right hip flexion AROM to >/= 90 degrees for improved ability to ambulate and sit upright.    Baseline 70 degrees right hip flexion AROM    Time 4    Period Weeks    Status New    Target Date 06/26/20      PT SHORT TERM GOAL #3   Title Patient will be able to ambulate >/= 150 feet indoors on level ground using RW or LRAD for improved household ambulation.    Baseline fatigue requiring to sit after ambulating 44 feet with RW in therapy gym; pt reports fatigue ambulating to/from bathroom at home    Time 4    Period Weeks    Status New    Target Date 06/26/20      PT SHORT TERM GOAL #4   Title Patient will be able to perform  standing>sit>supine transfer on mat at height of bed with minimal to no assistance with RLE.    Baseline  Pt reports she uses BUE or husband helps her get in/out of bed due to being able to lift RLE and swing it onto bed at this time    Time 4    Period Weeks    Status New    Target Date 06/26/20             PT Long Term Goals - 05/30/20 1827      PT LONG TERM GOAL #1   Title Patient will be independent with advanced HEP.    Baseline Provided initial HEP at evaluation 05/29/2020    Time 8    Period Weeks    Status New    Target Date 07/24/20      PT LONG TERM GOAL #2   Title Patient will improve right hip flexion AROM to >/= 110 degrees for improved ability to ambulate, sit upright, dress herself (don/doff socks and pants), and navigate stairs.    Baseline 70 degrees right hip flexion AROM    Time 8    Period Weeks    Status New    Target Date 07/24/20      PT LONG TERM GOAL #3   Title Patient will be able to ambulate >/= 800 feet indoors on level ground using RW or LRAD before requiring seated rest break for improved community ambulation.    Baseline fatigue requiring to sit after ambulating 44 feet with RW in therapy gym; pt reports fatigue ambulating to/from bathroom at home    Time 8    Period Weeks    Status New    Target Date 07/24/20      PT LONG TERM GOAL #4   Title Patient will report being able to don/doff pants and socks with minimal to no assistance and </= 4/10 pain.    Baseline Patient expresses significant difficulty dressing due to hip ROM limitations and surgical pain    Time 8    Period Weeks    Status New    Target Date 07/24/20      PT LONG TERM GOAL #5   Title Patient will demonstrate RLE MMT >/= 4+/5.    Baseline not tested secondary to total hip revision 05/20/2020 and limited motion/surgical pain    Time 8    Period Weeks    Status New    Target Date 07/24/20      PT LONG TERM GOAL #6   Title Patient will increase FOTO score from 8% to >/=  30% function to demonstrate improved perceived ability.    Baseline 8% function; predicted 39% function    Time 8    Period Weeks    Status New    Target Date 07/24/20                 Plan - 06/03/20 1625    Clinical Impression Statement Patient had good tolerance to PT session overall with persistent 6-7/10 right lateral hip pain during all activities that did not significantly worse. No adverse effects noted with interventions. Emphasized gait training and balance activities per physician request for 2 weeks of formal PT to focus on balance and coordination without activities that stress her right hip. Frequent seated rest breaks throughout session as needed between interventions and gait trips. Patient expresses having difficulty navigating stairs to enter/exit home. She should benefit from continued balance and gait training over next 2 sessions for improved safety and functional mobility.    Personal Factors and Comorbidities Age;Comorbidity 3+;Fitness;Past/Current Experience;Time since onset of injury/illness/exacerbation  Comorbidities See PMH above    Examination-Activity Limitations Bathing;Bed Mobility;Bend;Carry;Dressing;Hygiene/Grooming;Lift;Locomotion Level;Sit;Sleep;Squat;Stairs;Stand;Toileting;Transfers    Examination-Participation Restrictions Cleaning;Community Activity;Driving;Laundry;Meal Prep;Shop    Stability/Clinical Decision Making Stable/Uncomplicated    Rehab Potential Good    PT Frequency 2x / week    PT Duration 8 weeks    PT Treatment/Interventions ADLs/Self Care Home Management;Aquatic Therapy;Electrical Stimulation;Iontophoresis 4mg /ml Dexamethasone;Moist Heat;Neuromuscular re-education;Balance training;DME Instruction;Therapeutic exercise;Therapeutic activities;Functional mobility training;Stair training;Gait training;Patient/family education;Manual techniques;Dry needling;Energy conservation;Passive range of motion;Taping    PT Next Visit Plan Discharge  after next week with focus on balance and coordination only per MD (see note/office visit 06/02/2020). Continue balance and gait activities. Potential stair training if tolerated for improved safety when navigating stairs at home.    PT Home Exercise Plan [id number] - quad set, glute set, isometric hip ADD (ball/pillow squeeze), heel slide with strap/belt - HEP as tolerated. Balance and gait training safely at home with RW and sturdy countertop for UE support as needed during balance/standing    Consulted and Agree with Plan of Care Patient           Patient will benefit from skilled therapeutic intervention in order to improve the following deficits and impairments:  Decreased activity tolerance,Decreased balance,Decreased mobility,Decreased strength,Improper body mechanics,Decreased endurance,Decreased range of motion,Difficulty walking,Pain,Decreased knowledge of use of DME  Visit Diagnosis: Pain in right hip  Stiffness of right hip, not elsewhere classified  Difficulty in walking, not elsewhere classified  Other abnormalities of gait and mobility  Muscle weakness (generalized)     Problem List Patient Active Problem List   Diagnosis Date Noted  . 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 Status post revision of total hip 05/20/2020  . Trochanteric bursitis, right hip 09/05/2019  . 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  . Unilateral primary osteoarthritis, right hip 01/27/2017    01/29/2017, PT, DPT 06/03/20 4:48 PM  [facility] [address] [place], [place], [place] Phone: [phone]   Fax:  [phone]  Name: JAANA BRODT MRN: Burley Saver Date of Birth: 08/25/1970

## 2020-06-04 ENCOUNTER — Other Ambulatory Visit: Payer: Managed Care, Other (non HMO)

## 2020-06-06 ENCOUNTER — Other Ambulatory Visit: Payer: Self-pay

## 2020-06-06 MED ORDER — OXYCODONE HCL 5 MG PO TABS: 5.0000 mg | ORAL_TABLET | ORAL | 0 refills | Status: DC | PRN

## 2020-06-11 ENCOUNTER — Ambulatory Visit: Payer: Managed Care, Other (non HMO) | Admitting: Physical Therapy

## 2020-06-13 ENCOUNTER — Ambulatory Visit
Admission: RE | Admit: 2020-06-13 | Discharge: 2020-06-13 | Disposition: A | Discharge status: Home / Self Care | Payer: Managed Care, Other (non HMO) | Source: Ambulatory Visit | Attending: Family Medicine | Admitting: Family Medicine

## 2020-06-13 ENCOUNTER — Ambulatory Visit: Payer: Managed Care, Other (non HMO) | Admitting: Physical Therapy

## 2020-06-13 DIAGNOSIS — E041 Nontoxic single thyroid nodule: Secondary | ICD-10-CM

## 2020-06-16 ENCOUNTER — Ambulatory Visit: Payer: Managed Care, Other (non HMO)

## 2020-06-17 ENCOUNTER — Ambulatory Visit: Payer: Managed Care, Other (non HMO) | Attending: Orthopaedic Surgery | Admitting: Physical Therapy

## 2020-06-17 ENCOUNTER — Encounter: Payer: Self-pay | Admitting: Physical Therapy

## 2020-06-17 ENCOUNTER — Other Ambulatory Visit: Payer: Self-pay

## 2020-06-17 DIAGNOSIS — M6281 Muscle weakness (generalized): Secondary | ICD-10-CM | POA: Diagnosis present

## 2020-06-17 DIAGNOSIS — R262 Difficulty in walking, not elsewhere classified: Secondary | ICD-10-CM | POA: Diagnosis present

## 2020-06-17 DIAGNOSIS — M25651 Stiffness of right hip, not elsewhere classified: Secondary | ICD-10-CM | POA: Insufficient documentation

## 2020-06-17 DIAGNOSIS — M25551 Pain in right hip: Secondary | ICD-10-CM | POA: Diagnosis present

## 2020-06-17 DIAGNOSIS — R2689 Other abnormalities of gait and mobility: Secondary | ICD-10-CM | POA: Insufficient documentation

## 2020-06-17 NOTE — Therapy (Signed)
[place], [place], [address] Phone: [phone]   Fax:  [phone]  Physical Therapy Treatment  Patient Details  Name: Emily Hester MRN: [id number] Date of Birth: 09/19/1970 Referring Provider (PT): Mcarthur Rossetti, MD   Encounter Date: 06/17/2020   PT End of Session - 06/17/20 1110    Visit Number 3    Number of Visits 17    Date for PT Re-Evaluation 07/26/20    Authorization Type [employer] - FOTO visit 6 and visit 10    PT Start Time 1100    PT Stop Time 1138    PT Time Calculation (min) 38 min           Past Medical History:  Diagnosis Date  . Arthritis    patient denies  . Asthma   . Diabetes mellitus without complication ([place])    type   . GERD (gastroesophageal reflux disease)   . Hypertension   . Interstitial cystitis   . IUD (intrauterine device) in place placed 6 weeks ago  . Migraines    MIgraines- not current    . Pneumonia     Past Surgical History:  Procedure Laterality Date  . ANTERIOR HIP REVISION Right 05/20/2020   Procedure: RIGHT TOTAL HIP REVISION- ploy exhange and hip ball exchange with bone graft;  Surgeon: Mcarthur Rossetti, MD;  Location: [facility];  Service: Orthopedics;  Laterality: Right;  . DILATION AND EVACUATION  09/30/2011   Procedure: DILATATION AND EVACUATION;  Surgeon: Allena Katz, MD;  Location: [facility];  Service: Gynecology;  Laterality: N/A;  . left knee surgery      arthroscopy   . TOTAL HIP ARTHROPLASTY Right 03/11/2017   Procedure: RIGHT TOTAL HIP ARTHROPLASTY ANTERIOR APPROACH;  Surgeon: Mcarthur Rossetti, MD;  Location: [facility];  Service: Orthopedics;  Laterality: Right;    There were no vitals filed for this visit.   Subjective Assessment - 06/17/20 1100    Subjective My pain is elevated today at 6/10.    Currently in Pain? Yes    Pain Score 6     Pain Location Hip    Pain Orientation Right;Lateral    Pain Descriptors /  Indicators Discomfort    Pain Type Surgical pain    Aggravating Factors  walking    Pain Relieving Factors ice meds              [facility] PT Assessment - 06/17/20 0001      AROM   Overall AROM Comments AAROM R hip FL to 90 degrees during heel slides with green strap. Continued soreness and surgical pain with movements.   Right knee AAROM 94 flexion                        [facility] Adult PT Treatment/Exercise - 06/17/20 0001      Transfers   Comments able to transfer from RW to mat table sit <-> supine <-> without using UE to assit her RLE      Ambulation/Gait   Ambulation/Gait Yes    Ambulation/Gait Assistance 6: Modified independent (Device/Increase time)    Ambulation Distance (Feet) 285 Feet    Assistive device Rolling walker    Gait Pattern Step-through pattern    Stairs Yes    Stairs Assistance 6: Modified independent (Device/Increase time)    Stair Management Technique Step to pattern    Number of Stairs 4    Height of  Stairs 6    Pre-Gait Activities Worked in parallel bars simulating using LUE only for [place] training. She has increased antalgic pattern with this so encouraged continued RW . Pt demonstrated negotiating stiars with step to pattern and using both UE on right railing. Instructed pt in use of RW sideways on left side. Stair width in clinic is too narrow for RW - pt will try at home to see if it works better for her.    Gait Comments Pt ambulated with step-through pattern thorugh session. She ambulated into clinic , to parallel bars for gait and back to gym without rest, total of 285 ft.      Knee/Hip Exercises: Standing   Other Standing Knee Exercises weightshifting ML and AP x 15 each direction    Other Standing Knee Exercises standing with narrow BOS EO x 30 sec. Standing with EC 2 x 30 sec. standing on airex 3 x 30 sec then narrow BOS on airex x 30 sec with UE support at freemotion as needed      Knee/Hip Exercises: Seated   Long Arc Quad  Strengthening;Right;2 sets;10 reps    Other Seated Knee/Hip Exercises seated ball squeeze 2 x 10    Other Seated Knee/Hip Exercises seated in chair: toe raises x 20; heel raises x 20      Knee/Hip Exercises: Supine   Quad Sets Limitations 10 x in longsitting    Heel Slides AAROM;Right;10 reps    Heel Slides Limitations longsitting and supine with green strap within pain free range; 5 second hold at end range flexion                    PT Short Term Goals - 06/17/20 1143      PT SHORT TERM GOAL #1   Title Patient will be independent with initial HEP.    Time 4    Period Weeks    Status Achieved    Target Date 06/26/20      PT SHORT TERM GOAL #2   Title Patient will improve right hip flexion AROM to >/= 90 degrees for improved ability to ambulate and sit upright.    Baseline AAROM longsitting 90 degrees    Time 4    Period Weeks    Status On-going      PT SHORT TERM GOAL #3   Title Patient will be able to ambulate >/= 150 feet indoors on level ground using RW or LRAD for improved household ambulation.    Time 4    Period Weeks    Status Achieved      PT SHORT TERM GOAL #4   Title Patient will be able to perform standing>sit>supine transfer on mat at height of bed with minimal to no assistance with RLE.    Baseline no UE assist with stand<->sit<->supine transfers on mat table    Time 4    Period Weeks    Status Achieved    Target Date 06/26/20             PT Long Term Goals - 05/30/20 1827      PT LONG TERM GOAL #1   Title Patient will be independent with advanced HEP.    Baseline Provided initial HEP at evaluation 05/29/2020    Time 8    Period Weeks    Status New    Target Date 07/24/20      PT LONG TERM GOAL #2   Title Patient will improve right hip flexion AROM  to >/= 110 degrees for improved ability to ambulate, sit upright, dress herself (don/doff socks and pants), and navigate stairs.    Baseline 70 degrees right hip flexion AROM    Time 8     Period Weeks    Status New    Target Date 07/24/20      PT LONG TERM GOAL #3   Title Patient will be able to ambulate >/= 800 feet indoors on level ground using RW or LRAD before requiring seated rest break for improved community ambulation.    Baseline fatigue requiring to sit after ambulating 44 feet with RW in therapy gym; pt reports fatigue ambulating to/from bathroom at home    Time 8    Period Weeks    Status New    Target Date 07/24/20      PT LONG TERM GOAL #4   Title Patient will report being able to don/doff pants and socks with minimal to no assistance and </= 4/10 pain.    Baseline Patient expresses significant difficulty dressing due to hip ROM limitations and surgical pain    Time 8    Period Weeks    Status New    Target Date 07/24/20      PT LONG TERM GOAL #5   Title Patient will demonstrate RLE MMT >/= 4+/5.    Baseline not tested secondary to total hip revision 05/20/2020 and limited motion/surgical pain    Time 8    Period Weeks    Status New    Target Date 07/24/20      PT LONG TERM GOAL #6   Title Patient will increase FOTO score from 8% to >/= 30% function to demonstrate improved perceived ability.    Baseline 8% function; predicted 39% function    Time 8    Period Weeks    Status New    Target Date 07/24/20                 Plan - 06/17/20 1113    Clinical Impression Statement Pt reports elevated pain, 6/10, due to visitng her mom in hospital. Her pain has averaged 3-4/10 until today. Her gait in clinic increased to 285 using step though pattern and without increased pain. ABle to continue weight shifting and static balance activity without increased pain. Reviewed HEP and she demonstrates independence. Her AAROM of hip flexion has improved from 70 to 90 degrees. Pt demonstrates independence with stair climbing MOD I using both UE on right HR to ascend and descend. She was instructed in use of RW on left side to allow her to maintain forward facing  stair negotiation. She has met STG# 1,3,4. Per MD discharge next week. Possible HOLD unitl MD appt.    PT Next Visit Plan Discharge after next week with focus on balance and coordination only per MD (see note/office visit 06/02/2020). Continue balance and gait activities. Potential stair training if tolerated for improved safety when navigating stairs at home. Consider SPC on stairs?    PT Home Exercise Plan [id number] - quad set, glute set, isometric hip ADD (ball/pillow squeeze), heel slide with strap/belt - HEP as tolerated. Balance and gait training safely at home with RW and sturdy countertop for UE support as needed during balance/standing    Consulted and Agree with Plan of Care Patient           Patient will benefit from skilled therapeutic intervention in order to improve the following deficits and impairments:  Decreased activity tolerance,Decreased balance,Decreased mobility,Decreased  strength,Improper body mechanics,Decreased endurance,Decreased range of motion,Difficulty walking,Pain,Decreased knowledge of use of DME  Visit Diagnosis: Pain in right hip  Stiffness of right hip, not elsewhere classified  Muscle weakness (generalized)  Other abnormalities of gait and mobility  Difficulty in walking, not elsewhere classified     Problem List Patient Active Problem List   Diagnosis Date Noted  . 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[place]) 05/20/2020  . Status post revision of total hip 05/20/2020  . Trochanteric bursitis, right hip 09/05/2019  . 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  . Unilateral primary osteoarthritis, right hip 01/27/2017    Dorene Ar, PTA 06/17/2020, 1:19 PM  [place], [place], [address] Phone: [phone]   Fax:  [phone]  Name: Emily Hester MRN: [id number] Date of Birth: 1970/10/27

## 2020-06-18 ENCOUNTER — Ambulatory Visit: Payer: Managed Care, Other (non HMO)

## 2020-06-19 ENCOUNTER — Other Ambulatory Visit: Payer: Self-pay

## 2020-06-19 ENCOUNTER — Ambulatory Visit: Payer: Managed Care, Other (non HMO)

## 2020-06-19 DIAGNOSIS — M25551 Pain in right hip: Secondary | ICD-10-CM

## 2020-06-19 DIAGNOSIS — R262 Difficulty in walking, not elsewhere classified: Secondary | ICD-10-CM

## 2020-06-19 DIAGNOSIS — M25651 Stiffness of right hip, not elsewhere classified: Secondary | ICD-10-CM

## 2020-06-19 DIAGNOSIS — R2689 Other abnormalities of gait and mobility: Secondary | ICD-10-CM

## 2020-06-19 DIAGNOSIS — M6281 Muscle weakness (generalized): Secondary | ICD-10-CM

## 2020-06-19 NOTE — Therapy (Addendum)
[place], [place], [address] Phone: [phone]   Fax:  [phone]  Physical Therapy Treatment/Discharge  Patient Details  Name: Emily Hester MRN: [id number] Date of Birth: 04/10/1970 Referring Provider (PT): Mcarthur Rossetti, MD   Encounter Date: 06/19/2020   PT End of Session - 06/19/20 1009    Visit Number 4    Number of Visits 17    Date for PT Re-Evaluation 07/26/20    Authorization Type [employer] - FOTO visit 6 and visit 10    PT Start Time 1010    PT Stop Time 1055    PT Time Calculation (min) 45 min    Equipment Utilized During Treatment Gait belt;Other (comment)   RW and SPC   Activity Tolerance Patient tolerated treatment well    Behavior During Therapy WFL for tasks assessed/performed           Past Medical History:  Diagnosis Date  . Arthritis    patient denies  . Asthma   . Diabetes mellitus without complication ([place])    type   . GERD (gastroesophageal reflux disease)   . Hypertension   . Interstitial cystitis   . IUD (intrauterine device) in place placed 6 weeks ago  . Migraines    MIgraines- not current    . Pneumonia     Past Surgical History:  Procedure Laterality Date  . ANTERIOR HIP REVISION Right 05/20/2020   Procedure: RIGHT TOTAL HIP REVISION- ploy exhange and hip ball exchange with bone graft;  Surgeon: Mcarthur Rossetti, MD;  Location: [facility];  Service: Orthopedics;  Laterality: Right;  . DILATION AND EVACUATION  09/30/2011   Procedure: DILATATION AND EVACUATION;  Surgeon: Allena Katz, MD;  Location: [facility];  Service: Gynecology;  Laterality: N/A;  . left knee surgery      arthroscopy   . TOTAL HIP ARTHROPLASTY Right 03/11/2017   Procedure: RIGHT TOTAL HIP ARTHROPLASTY ANTERIOR APPROACH;  Surgeon: Mcarthur Rossetti, MD;  Location: [facility];  Service: Orthopedics;  Laterality: Right;    There were no vitals filed for this visit.    Subjective Assessment - 06/19/20 1014    Subjective I've probably been doing too much. Pain is a little more today    Currently in Pain? Yes    Pain Score 6     Pain Location Hip    Pain Orientation Right;Lateral    Pain Descriptors / Indicators --   stinging   Pain Type Surgical pain                             [facility] Adult PT Treatment/Exercise - 06/19/20 0001      Self-Care   Other Self-Care Comments  see patient education      Neuro Re-ed    Neuro Re-ed Details  romberg 1 x 20 sec; romberg eyes closed 2 x 20 sec; semi tandem 1 x 20 sec, semi-tandem eyes closed 1 x 20 sec      Knee/Hip Exercises: Standing   Heel Raises 15 reps    Heel Raises Limitations x 2    Gait Training pre-gait activities: forward step overs small cone 1 x 10 each, weightshifting fwd/bwd and lateral x 10 each, then gait training with [facility]    Other Standing Knee Exercises --    Other Standing Knee Exercises --  PT Education - 06/19/20 1058    Education Details Updated HEP. Continue use of RW for household and community ambulation. Proper utilization of RW for stair negotiation explanation and handout    Person(s) Educated Patient    Methods Explanation;Demonstration;Verbal cues;Handout    Comprehension Verbalized understanding;Returned demonstration            PT Short Term Goals - 06/17/20 1143      PT SHORT TERM GOAL #1   Title Patient will be independent with initial HEP.    Time 4    Period Weeks    Status Achieved    Target Date 06/26/20      PT SHORT TERM GOAL #2   Title Patient will improve right hip flexion AROM to >/= 90 degrees for improved ability to ambulate and sit upright.    Baseline AAROM longsitting 90 degrees    Time 4    Period Weeks    Status On-going      PT SHORT TERM GOAL #3   Title Patient will be able to ambulate >/= 150 feet indoors on level ground using RW or LRAD for improved household ambulation.    Time 4    Period  Weeks    Status Achieved      PT SHORT TERM GOAL #4   Title Patient will be able to perform standing>sit>supine transfer on mat at height of bed with minimal to no assistance with RLE.    Baseline no UE assist with stand<->sit<->supine transfers on mat table    Time 4    Period Weeks    Status Achieved    Target Date 06/26/20             PT Long Term Goals - 05/30/20 1827      PT LONG TERM GOAL #1   Title Patient will be independent with advanced HEP.    Baseline Provided initial HEP at evaluation 05/29/2020    Time 8    Period Weeks    Status New    Target Date 07/24/20      PT LONG TERM GOAL #2   Title Patient will improve right hip flexion AROM to >/= 110 degrees for improved ability to ambulate, sit upright, dress herself (don/doff socks and pants), and navigate stairs.    Baseline 70 degrees right hip flexion AROM    Time 8    Period Weeks    Status New    Target Date 07/24/20      PT LONG TERM GOAL #3   Title Patient will be able to ambulate >/= 800 feet indoors on level ground using RW or LRAD before requiring seated rest break for improved community ambulation.    Baseline fatigue requiring to sit after ambulating 44 feet with RW in therapy gym; pt reports fatigue ambulating to/from bathroom at home    Time 8    Period Weeks    Status New    Target Date 07/24/20      PT LONG TERM GOAL #4   Title Patient will report being able to don/doff pants and socks with minimal to no assistance and </= 4/10 pain.    Baseline Patient expresses significant difficulty dressing due to hip ROM limitations and surgical pain    Time 8    Period Weeks    Status New    Target Date 07/24/20      PT LONG TERM GOAL #5   Title Patient will demonstrate RLE MMT >/= 4+/5.  Baseline not tested secondary to total hip revision 05/20/2020 and limited motion/surgical pain    Time 8    Period Weeks    Status New    Target Date 07/24/20      PT LONG TERM GOAL #6   Title Patient will  increase FOTO score from 8% to >/= 30% function to demonstrate improved perceived ability.    Baseline 8% function; predicted 39% function    Time 8    Period Weeks    Status New    Target Date 07/24/20                 Plan - 06/19/20 1036    Clinical Impression Statement Patient reports this will be her last visit until she has f/u with Dr. Ninfa Linden on 06/30/20. Session focused on gait training and balance activity with HEP updated to reflect static balance exercises and weight shifting activity. Continued gait training with SPC, though patient continues to demonstrate antalgic gait with reports of instability so was recommended to continue use of RW at this time. Unable to demonstrate proper use of RW on stairs in clinic as our stairs are not wide enough, though patient educated on proper placement and provided handout for further information regarding appropriate placement of RW for stair descent/ascent. PT is currently placed on hold until patient has follow-up with MD with recommendation to continue HEP at this time.    PT Treatment/Interventions ADLs/Self Care Home Management;Aquatic Therapy;Electrical Stimulation;Iontophoresis 15m/ml Dexamethasone;Moist Heat;Neuromuscular re-education;Balance training;DME Instruction;Therapeutic exercise;Therapeutic activities;Functional mobility training;Stair training;Gait training;Patient/family education;Manual techniques;Dry needling;Energy conservation;Passive range of motion;Taping    PT Next Visit Plan Continue balance and gait activities. Potential stair training if tolerated for improved safety when navigating stairs at home. Consider SPC on stairs?    PT Home Exercise Plan [id number] - quad set, glute set, isometric hip ADD (ball/pillow squeeze), heel slide with strap/belt - HEP as tolerated. Balance and gait training safely at home with RW and sturdy countertop for UE support as needed during balance/standing    Consulted and Agree with Plan of  Care Patient           Patient will benefit from skilled therapeutic intervention in order to improve the following deficits and impairments:  Decreased activity tolerance,Decreased balance,Decreased mobility,Decreased strength,Improper body mechanics,Decreased endurance,Decreased range of motion,Difficulty walking,Pain,Decreased knowledge of use of DME  Visit Diagnosis: Pain in right hip  Stiffness of right hip, not elsewhere classified  Muscle weakness (generalized)  Other abnormalities of gait and mobility  Difficulty in walking, not elsewhere classified     Problem List Patient Active Problem List   Diagnosis Date Noted  . 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  . Status post revision of total hip 05/20/2020  . Trochanteric bursitis, right hip 09/05/2019  . 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  . Unilateral primary osteoarthritis, right hip 01/27/2017   SGwendolyn Grant PT, DPT, ATC 06/19/20 12:08 PM PHYSICAL THERAPY DISCHARGE SUMMARY  Visits from Start of Care: 4  Current functional level related to goals / functional outcomes: LTG not formally re-assessed.     Remaining deficits: See above   Education / Equipment: See above   Plan: Patient agrees to discharge.  Patient goals were partially met. Patient is being discharged due to not returning since the last visit.  ?????        SGwendolyn Grant PT, DPT, ATC 07/22/20 11:50 AM  [facility] [address]  Phone: [phone]   Fax:  [phone]  Name: Emily Hester MRN: [id number] Date of Birth: [birth year]

## 2020-06-22 ENCOUNTER — Other Ambulatory Visit: Payer: Self-pay | Admitting: Orthopaedic Surgery

## 2020-06-23 MED ORDER — OXYCODONE HCL 5 MG PO TABS: 5.0000 mg | ORAL_TABLET | ORAL | 0 refills | Status: DC | PRN

## 2020-06-23 NOTE — Telephone Encounter (Signed)
Refill

## 2020-06-30 ENCOUNTER — Ambulatory Visit: Payer: Managed Care, Other (non HMO) | Admitting: Orthopaedic Surgery

## 2020-07-07 ENCOUNTER — Ambulatory Visit (INDEPENDENT_AMBULATORY_CARE_PROVIDER_SITE_OTHER): Payer: Managed Care, Other (non HMO)

## 2020-07-07 ENCOUNTER — Encounter: Payer: Self-pay | Admitting: Orthopaedic Surgery

## 2020-07-07 ENCOUNTER — Ambulatory Visit (INDEPENDENT_AMBULATORY_CARE_PROVIDER_SITE_OTHER): Payer: Managed Care, Other (non HMO) | Admitting: Orthopaedic Surgery

## 2020-07-07 DIAGNOSIS — Z96641 Presence of right artificial hip joint: Secondary | ICD-10-CM | POA: Diagnosis not present

## 2020-07-07 DIAGNOSIS — Z96649 Presence of unspecified artificial hip joint: Secondary | ICD-10-CM

## 2020-07-07 MED ORDER — LIDOCAINE 5 % EX PTCH: 1.0000 | MEDICATED_PATCH | Freq: Every day | CUTANEOUS | 3 refills | Status: DC | PRN

## 2020-07-07 NOTE — Progress Notes (Signed)
The patient is now about 7 weeks status post a right hip revision in terms of changing out the polythene liner and putting in a longer hip ball.  This did help her leg length and offset.  She has had x-rays and MRI in the past that was worrisome for prosthetic loosening.  We did not find the femoral component loose at all when we were in there.  There is no fluid collections and just some scar tissue.  Since surgery she is actually doing better.  She has a little bit of sensitivity around the trochanteric area.  She is also had a bursectomy on that right hip and lengthening of the IT band.  She still uses a crutch in her opposite hand or a cane to walk with.  I had her lay in a supine position her leg lengths are exactly equal.  There is some pain over the trochanteric area and I felt it was worthwhile trying a steroid injection in this area today which she tolerated well.  An AP pelvis and lateral of the right hip shows thickening of the bone around the trochanteric area and the prosthesis itself.  This is not changed from previous films.  Her offset and leg lengths are equal.  She will continue to increase her activities as comfort allows.  She will try Lidoderm patches and anti-inflammatory topically.  There is really nothing else that I would recommend surgically having been on that hip now for second time and not finding loosening of the prosthesis or evidence of infection.  She needs to still work on weight loss and posture.  We will see her back in 3 months to see how she is doing overall but no x-rays are needed.

## 2020-08-10 ENCOUNTER — Other Ambulatory Visit: Payer: Self-pay | Admitting: Orthopaedic Surgery

## 2020-08-12 MED ORDER — TIZANIDINE HCL 4 MG PO TABS: 4.0000 mg | ORAL_TABLET | Freq: Four times a day (QID) | ORAL | 0 refills | Status: DC | PRN

## 2020-08-12 MED ORDER — OXYCODONE HCL 5 MG PO TABS: 5.0000 mg | ORAL_TABLET | ORAL | 0 refills | Status: DC | PRN

## 2020-08-20 ENCOUNTER — Encounter: Payer: Self-pay | Admitting: Orthopaedic Surgery

## 2020-08-21 ENCOUNTER — Other Ambulatory Visit: Payer: Self-pay

## 2020-08-21 DIAGNOSIS — M4807 Spinal stenosis, lumbosacral region: Secondary | ICD-10-CM

## 2020-08-22 ENCOUNTER — Ambulatory Visit (INDEPENDENT_AMBULATORY_CARE_PROVIDER_SITE_OTHER): Payer: Managed Care, Other (non HMO) | Admitting: Allergy

## 2020-08-22 ENCOUNTER — Other Ambulatory Visit: Payer: Self-pay

## 2020-08-22 ENCOUNTER — Encounter: Payer: Self-pay | Admitting: Allergy

## 2020-08-22 VITALS — BP 128/80 | HR 85 | Temp 97.6°F | Resp 18 | Ht 65.0 in | Wt 280.6 lb

## 2020-08-22 DIAGNOSIS — H1013 Acute atopic conjunctivitis, bilateral: Secondary | ICD-10-CM | POA: Diagnosis not present

## 2020-08-22 DIAGNOSIS — J4520 Mild intermittent asthma, uncomplicated: Secondary | ICD-10-CM

## 2020-08-22 DIAGNOSIS — J310 Chronic rhinitis: Secondary | ICD-10-CM

## 2020-08-22 DIAGNOSIS — J3089 Other allergic rhinitis: Secondary | ICD-10-CM | POA: Diagnosis not present

## 2020-08-22 MED ORDER — OLOPATADINE HCL 0.1 % OP SOLN: 1.0000 [drp] | Freq: Every day | OPHTHALMIC | 5 refills | Status: DC | PRN

## 2020-08-22 MED ORDER — CETIRIZINE HCL 10 MG PO TABS: 10.0000 mg | ORAL_TABLET | Freq: Every day | ORAL | 5 refills | Status: DC

## 2020-08-22 MED ORDER — IPRATROPIUM BROMIDE 0.06 % NA SOLN: 2.0000 | Freq: Three times a day (TID) | NASAL | 5 refills | Status: DC

## 2020-08-22 MED ORDER — MONTELUKAST SODIUM 10 MG PO TABS: 10.0000 mg | ORAL_TABLET | Freq: Every day | ORAL | 5 refills | Status: DC

## 2020-08-22 MED ORDER — ALBUTEROL SULFATE HFA 108 (90 BASE) MCG/ACT IN AERS: 2.0000 | INHALATION_SPRAY | RESPIRATORY_TRACT | 1 refills | Status: DC | PRN

## 2020-08-22 MED ORDER — FLUTICASONE-SALMETEROL 250-50 MCG/ACT IN AEPB: 1.0000 | INHALATION_SPRAY | Freq: Two times a day (BID) | RESPIRATORY_TRACT | 5 refills | Status: DC

## 2020-08-22 NOTE — Progress Notes (Signed)
Follow-up Note  RE: Emily Hester MRN: [id number] DOB: 08-04-1970 Date of Office Visit: 08/22/2020   History of present illness: Emily Hester is a 50 y.o. female presenting today for follow-up of asthma, allergic rhinitis.  She was last seen in the office on 08/30/2019 by myself.  She has not had any major health changes or hospitalizations since her last visit.  She states with the heat and humidity she has been noticing more asthma symptoms including cough, shortness of breath and nighttime awakenings nightly.  This has been going on for the past 2 weeks or so.  She states she did well over the colder months.  She has not required any ED or urgent care visits or any systemic steroid needs.  Her insurance was previously covering Wixela 250/50 but stopped sometime this year about a couple of months ago.  She states she was given branded Advair discus 100/50 to replace her Wixela.  She has been using this 1 puff twice a day.  With her increase in symptoms however she has been using her albuterol at least 2-3 times a week.  She does continue to take singular daily.  She states other triggers besides that he include fragranced items like while plug-in's or overly perfumed people. For the most part she states her allergy symptoms are doing okay.  She has a little nasal drainage and itchy eyes but this is manageable..  She takes Zyrtec daily that helps along with the Singulair.  She is out of nasal Atrovent and states that that would help with the drainage when she would use it.  She does not think her eye symptoms are bad enough at this time to warrant use of an eyedrop.     Review of systems: Review of Systems  Constitutional: Negative.   HENT:         See HPI  Eyes:        See HPI  Respiratory:         See HPI  Cardiovascular: Negative.   Gastrointestinal: Negative.   Musculoskeletal: Negative.   Skin: Negative.   Neurological: Negative.    All other systems negative unless  noted above in HPI  Past medical/social/surgical/family history have been reviewed and are unchanged unless specifically indicated below.  No changes  Medication List: Current Outpatient Medications  Medication Sig Dispense Refill   acetaminophen (TYLENOL) 500 MG tablet Take 1,000 mg by mouth every 6 (six) hours as needed for moderate pain.     amLODipine (NORVASC) 10 MG tablet Take 10 mg by mouth every morning.     aspirin-acetaminophen-caffeine (EXCEDRIN MIGRAINE) 250-250-65 MG tablet Take 2 tablets by mouth every 6 (six) hours as needed for headache.     Cholecalciferol (VITAMIN D) 50 MCG (2000 UT) tablet Take 2,000 Units by mouth daily.     clindamycin (CLEOCIN) 150 MG capsule Take 600 mg by mouth See admin instructions. Take 600 mg 1 hour prior to dental work     diphenhydrAMINE (BENADRYL) 25 MG tablet Take 25 mg by mouth daily as needed for allergies.     Dulaglutide (TRULICITY) 1.5 MG/0.5ML SOPN Inject 1.5 mg into the skin every Tuesday.     ertugliflozin L-PyroglutamicAc (STEGLATRO) 15 MG TABS tablet Take 15 mg by mouth daily.     fluticasone-salmeterol (ADVAIR) 250-50 MCG/ACT AEPB Inhale 1 puff into the lungs in the morning and at bedtime. 60 each 5   ibuprofen (ADVIL) 200 MG tablet Take 600 mg by mouth every  6 (six) hours as needed for headache or moderate pain.     levonorgestrel (MIRENA) 20 MCG/24HR IUD 1 each by Intrauterine route once.     lidocaine (LIDODERM) 5 % Place 1 patch onto the skin daily as needed (pain). Remove & Discard patch within 12 hours or as directed by MD 30 patch 3   losartan (COZAAR) 25 MG tablet Take 25 mg by mouth daily.  0   metFORMIN (GLUCOPHAGE-XR) 750 MG 24 hr tablet Take 750 mg by mouth 2 (two) times daily.     omeprazole (PRILOSEC) 20 MG capsule Take 1 capsule (20 mg total) by mouth every morning. (Patient taking differently: Take 20 mg by mouth daily.) 30 capsule 5   ondansetron (ZOFRAN ODT) 4 MG disintegrating tablet Take 1 tablet (4 mg total) by  mouth every 8 (eight) hours as needed for nausea or vomiting. 20 tablet 1   ONETOUCH VERIO test strip as directed.   3   oxyCODONE (OXY IR/ROXICODONE) 5 MG immediate release tablet Take 1-2 tablets (5-10 mg total) by mouth every 4 (four) hours as needed for moderate pain (pain score 4-6). 30 tablet 0   rosuvastatin (CRESTOR) 10 MG tablet Take 10 mg by mouth daily.  3   tiZANidine (ZANAFLEX) 4 MG tablet Take 1 tablet (4 mg total) by mouth every 6 (six) hours as needed for muscle spasms. 60 tablet 0   albuterol (VENTOLIN HFA) 108 (90 Base) MCG/ACT inhaler Inhale 2 puffs into the lungs every 4 (four) hours as needed for wheezing or shortness of breath. 18 g 1   aspirin 81 MG chewable tablet Chew 1 tablet (81 mg total) by mouth 2 (two) times daily. (Patient not taking: Reported on 08/22/2020) 30 tablet 0   cetirizine (ZYRTEC) 10 MG tablet Take 1 tablet (10 mg total) by mouth daily. 30 tablet 5   Fluticasone-Salmeterol (WIXELA INHUB) 250-50 MCG/DOSE AEPB Inhale 1 puff into the lungs 2 (two) times daily. (Patient not taking: Reported on 08/22/2020) 60 each 5   ipratropium (ATROVENT) 0.06 % nasal spray Place 2 sprays into both nostrils 3 (three) times daily. 15 mL 5   montelukast (SINGULAIR) 10 MG tablet Take 1 tablet (10 mg total) by mouth at bedtime. 30 tablet 5   olopatadine (PATANOL) 0.1 % ophthalmic solution Place 1 drop into both eyes daily as needed for allergies. 5 mL 5   ondansetron (ZOFRAN) 8 MG tablet Take 8 mg by mouth every 8 (eight) hours as needed for nausea or vomiting. (Patient not taking: Reported on 08/22/2020)     No current facility-administered medications for this visit.     Known medication allergies: Allergies  Allergen Reactions   Amoxicillin Hives, Swelling and Other (See Comments)    Has patient had a PCN reaction causing immediate rash, facial/tongue/throat swelling, SOB or lightheadedness with hypotension: Yes Has patient had a PCN reaction causing severe rash involving  mucus membranes or skin necrosis: No Has patient had a PCN reaction that required hospitalization: Yes Has patient had a PCN reaction occurring within the last 10 years: No  If all of the above answers are "NO", then may proceed with Cephalosporin use.    Lisinopril Itching and Cough    Heart racing    Olmesartan     jittery   Bactrim Rash     Physical examination: Blood pressure 128/80, pulse 85, temperature 97.6 F (36.4 C), resp. rate 18, height 5\' 5"  (1.651 m), weight 280 lb 9.6 oz (127.3 kg), SpO2 98 %.  General: Alert, interactive, in no acute distress. HEENT: PERRLA, TMs pearly gray, turbinates non-edematous without discharge, post-pharynx non erythematous. Neck: Supple without lymphadenopathy. Lungs: Clear to auscultation without wheezing, rhonchi or rales. {no increased work of breathing. CV: Normal S1, S2 without murmurs. Abdomen: Nondistended, nontender. Skin: Warm and dry, without lesions or rashes. Extremities:  No clubbing, cyanosis or edema. Neuro:   Grossly intact.  Diagnositics/Labs:  Spirometry: FEV1: 1.69 L 68%, FVC: 2.23 L 71%, ratio consistent with restrictive pattern.  This is relatively stable from her previous study  Assessment and plan: 1. Asthma - at this time not under good control.  Heat/humidity exacerbates symptoms - increase to Advair 250/50 1 puff twice a day  - continue Singulair (Montelukast) 10mg  nightly - have access to albuterol inhaler 2 puffs every 4-6 hours as needed for cough/wheeze/shortness of breath/chest tightness.  May use 15-20 minutes prior to activity.   Monitor frequency of use.  - recommend use of your albuterol prior to outdoor activity, grilling etc - provided with prednisone pack to take in next several days if you are not noticing a decrease in symptoms and albuterol use  - continue to do your best to avoid fragrance products  Asthma control goals:  Full participation in all desired activities (may need albuterol before  activity) Albuterol use two time or less a week on average (not counting use with activity) Cough interfering with sleep two time or less a month Oral steroids no more than once a year No hospitalizations   2. Allergic Rhinitis with conjunctivitis - continue allergen avoidance measures - continue Zyrtec (Cetirizine) 10 mg daily  - continue Singulair (Montelukast) nightly - for nasal drainage/post-nasal drip use nasal Atrovent 0.06% 2 sprays each nostril up to 3-4 times a day as needed - use over-the-counter Pataday Xtra Strength, Pataday 1 drop each eye daily or Zaditor or Alaway 1 drop each eye twice a day as needed for itchy/watery/red eyes.   These are all over-the-counter allergy eyedrop you can use.   Follow up within 4 months or sooner if needed  I appreciate the opportunity to take part in Emily Hester's care. Please do not hesitate to contact me with questions.  Sincerely,   , MD Allergy/Immunology [facility]

## 2020-08-22 NOTE — Patient Instructions (Addendum)
1. Asthma - at this time not under good control.  Heat/humidity exacerbates symptoms - increase to Advair 250/50 1 puff twice a day  - continue Singulair (Montelukast) 10mg  nightly - have access to albuterol inhaler 2 puffs every 4-6 hours as needed for cough/wheeze/shortness of breath/chest tightness.  May use 15-20 minutes prior to activity.   Monitor frequency of use.  - recommend use of your albuterol prior to outdoor activity, grilling etc - provided with prednisone pack to take in next several days if you are not noticing a decrease in symptoms and albuterol use  - continue to do your best to avoid fragrance products  Asthma control goals:  Full participation in all desired activities (may need albuterol before activity) Albuterol use two time or less a week on average (not counting use with activity) Cough interfering with sleep two time or less a month Oral steroids no more than once a year No hospitalizations   2. Allergic Rhinitis with conjunctivitis - continue allergen avoidance measures - continue Zyrtec (Cetirizine) 10 mg daily  - continue Singulair (Montelukast) nightly - for nasal drainage/post-nasal drip use nasal Atrovent 0.06% 2 sprays each nostril up to 3-4 times a day as needed - use over-the-counter Pataday Xtra Strength, Pataday 1 drop each eye daily or Zaditor or Alaway 1 drop each eye twice a day as needed for itchy/watery/red eyes.   These are all over-the-counter allergy eyedrop you can use.   Follow up within 4 months or sooner if needed

## 2020-08-27 ENCOUNTER — Other Ambulatory Visit: Payer: Self-pay | Admitting: Orthopaedic Surgery

## 2020-08-27 NOTE — Telephone Encounter (Signed)
ok 

## 2020-08-29 ENCOUNTER — Other Ambulatory Visit: Payer: Self-pay | Admitting: *Deleted

## 2020-08-29 MED ORDER — ALBUTEROL SULFATE (2.5 MG/3ML) 0.083% IN NEBU: 2.5000 mg | INHALATION_SOLUTION | RESPIRATORY_TRACT | 1 refills | Status: DC | PRN

## 2020-09-04 ENCOUNTER — Other Ambulatory Visit: Payer: Self-pay

## 2020-09-04 ENCOUNTER — Other Ambulatory Visit: Payer: Self-pay | Admitting: Orthopaedic Surgery

## 2020-09-04 DIAGNOSIS — J310 Chronic rhinitis: Secondary | ICD-10-CM

## 2020-09-04 DIAGNOSIS — J3089 Other allergic rhinitis: Secondary | ICD-10-CM

## 2020-09-04 DIAGNOSIS — J4520 Mild intermittent asthma, uncomplicated: Secondary | ICD-10-CM

## 2020-09-04 MED ORDER — IPRATROPIUM BROMIDE 0.06 % NA SOLN: 2.0000 | Freq: Three times a day (TID) | NASAL | 1 refills | Status: DC

## 2020-09-04 MED ORDER — CETIRIZINE HCL 10 MG PO TABS: 10.0000 mg | ORAL_TABLET | Freq: Every day | ORAL | 1 refills | Status: DC

## 2020-09-04 MED ORDER — OLOPATADINE HCL 0.1 % OP SOLN: 1.0000 [drp] | Freq: Every day | OPHTHALMIC | 1 refills | Status: DC | PRN

## 2020-09-04 MED ORDER — FLUTICASONE-SALMETEROL 250-50 MCG/ACT IN AEPB: 1.0000 | INHALATION_SPRAY | Freq: Two times a day (BID) | RESPIRATORY_TRACT | 1 refills | Status: DC

## 2020-09-04 MED ORDER — MONTELUKAST SODIUM 10 MG PO TABS: 10.0000 mg | ORAL_TABLET | Freq: Every day | ORAL | 1 refills | Status: DC

## 2020-09-14 ENCOUNTER — Ambulatory Visit (INDEPENDENT_AMBULATORY_CARE_PROVIDER_SITE_OTHER): Payer: Managed Care, Other (non HMO)

## 2020-09-14 ENCOUNTER — Other Ambulatory Visit: Payer: Self-pay

## 2020-09-14 ENCOUNTER — Ambulatory Visit
Admission: EM | Admit: 2020-09-14 | Discharge: 2020-09-14 | Disposition: A | Discharge status: Home / Self Care | Payer: Managed Care, Other (non HMO) | Attending: Family Medicine | Admitting: Family Medicine

## 2020-09-14 DIAGNOSIS — R059 Cough, unspecified: Secondary | ICD-10-CM

## 2020-09-14 DIAGNOSIS — U099 Post covid-19 condition, unspecified: Secondary | ICD-10-CM

## 2020-09-14 DIAGNOSIS — R053 Chronic cough: Secondary | ICD-10-CM | POA: Diagnosis not present

## 2020-09-14 MED ORDER — PROMETHAZINE-DM 6.25-15 MG/5ML PO SYRP: 5.0000 mL | ORAL_SOLUTION | Freq: Four times a day (QID) | ORAL | 0 refills | Status: DC | PRN

## 2020-09-14 NOTE — ED Provider Notes (Signed)
[facility]    CSN: [id number] Arrival date & time: 09/14/20  6967      History   Chief Complaint Chief Complaint  Patient presents with   Cough    Dry, hacking cough x 3 wks  Rx Hycodan cough syrup, OTC NyQuil with no relief       HPI 50 year old female presents with cough.  Persistent cough x3 weeks.  Patient tested positive for COVID-19 3 weeks ago.  She was treated with Paxlovid.  Patient states that she was prescribed Hycodan cough syrup and this has helped but her cough has not resolved.  Denies shortness of breath.  Reports some associated chest and back discomfort from the cough.  No fever.  Cough worse at night.  She has been taking NyQuil recently without resolution.  No other associated symptoms.  No other complaints.   Past Medical History:  Diagnosis Date   Arthritis    patient denies   Asthma    Diabetes mellitus without complication (HCC)    type    GERD (gastroesophageal reflux disease)    Hypertension    Interstitial cystitis    IUD (intrauterine device) in place placed 6 weeks ago   Migraines    MIgraines- not current     Pneumonia     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HCC) 05/20/2020   Status post revision of total hip 05/20/2020   Trochanteric bursitis, right hip 09/05/2019   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   Unilateral primary osteoarthritis, right hip 01/27/2017    Past Surgical History:  Procedure Laterality Date   ANTERIOR HIP REVISION Right 05/20/2020   Procedure: RIGHT TOTAL HIP REVISION- ploy exhange and hip ball exchange with bone graft;  Surgeon: Kathryne Hitch, MD;  Location: [facility] OR;  Service: Orthopedics;  Laterality: Right;   DILATION AND EVACUATION  09/30/2011   Procedure: DILATATION AND EVACUATION;  Surgeon: Auriella Andrea, MD;  Location: [facility];  Service: Gynecology;  Laterality: N/A;   left knee surgery      arthroscopy     TOTAL HIP ARTHROPLASTY Right 03/11/2017   Procedure: RIGHT TOTAL HIP ARTHROPLASTY ANTERIOR APPROACH;  Surgeon: Kathryne Hitch, MD;  Location: [facility];  Service: Orthopedics;  Laterality: Right;    OB History     Gravida  1   Para      Term      Preterm      AB      Living         SAB      IAB      Ectopic      Multiple      Live Births               Home Medications    Prior to Admission medications   Medication Sig Start Date End Date Taking? Authorizing Provider  promethazine-dextromethorphan (PROMETHAZINE-DM) 6.25-15 MG/5ML syrup Take 5 mLs by mouth 4 (four) times daily as needed for cough. 09/14/20  Yes Karmel Patricelli G, DO  acetaminophen (TYLENOL) 500 MG tablet Take 1,000 mg by mouth every 6 (six) hours as needed for moderate pain.    [provider]  albuterol (PROVENTIL) (2.5 MG/3ML) 0.083% nebulizer solution Take 3 mLs (2.5 mg total) by nebulization every 4 (four) hours as needed for wheezing or shortness of breath. 08/29/20   Marcelyn Bruins, MD  albuterol (VENTOLIN HFA) 108 (90  Base) MCG/ACT inhaler Inhale 2 puffs into the lungs every 4 (four) hours as needed for wheezing or shortness of breath. 08/22/20   Marcelyn Bruins, MD  amLODipine (NORVASC) 10 MG tablet Take 10 mg by mouth every morning.    [provider]  aspirin 81 MG chewable tablet Chew 1 tablet (81 mg total) by mouth 2 (two) times daily. Patient not taking: Reported on 08/22/2020 05/23/20   Kathryne Hitch, MD  aspirin-acetaminophen-caffeine [facility] MIGRAINE) [phone] MG tablet Take 2 tablets by mouth every 6 (six) hours as needed for headache.    [provider]  cetirizine (ZYRTEC) 10 MG tablet Take 1 tablet (10 mg total) by mouth daily. 09/04/20   Marcelyn Bruins, MD  Cholecalciferol (VITAMIN D) 50 MCG (2000 UT) tablet Take 2,000 Units by mouth daily.    [provider]  clindamycin (CLEOCIN) 150 MG capsule Take 600  mg by mouth See admin instructions. Take 600 mg 1 hour prior to dental work 10/10/19   [provider]  diphenhydrAMINE (BENADRYL) 25 MG tablet Take 25 mg by mouth daily as needed for allergies.    [provider]  Dulaglutide (TRULICITY) 1.5 MG/0.5ML SOPN Inject 1.5 mg into the skin every Tuesday.    [provider]  ertugliflozin L-PyroglutamicAc (STEGLATRO) 15 MG TABS tablet Take 15 mg by mouth daily.    [provider]  fluticasone-salmeterol (ADVAIR) 250-50 MCG/ACT AEPB Inhale 1 puff into the lungs in the morning and at bedtime. 09/04/20   Marcelyn Bruins, MD  ibuprofen (ADVIL) 200 MG tablet Take 600 mg by mouth every 6 (six) hours as needed for headache or moderate pain.    [provider]  ipratropium (ATROVENT) 0.06 % nasal spray Place 2 sprays into both nostrils 3 (three) times daily. 09/04/20   Marcelyn Bruins, MD  levonorgestrel (MIRENA) 20 MCG/24HR IUD 1 each by Intrauterine route once.    [provider]  lidocaine (LIDODERM) 5 % Place 1 patch onto the skin daily as needed (pain). Remove & Discard patch within 12 hours or as directed by MD 07/07/20   Kathryne Hitch, MD  losartan (COZAAR) 25 MG tablet Take 25 mg by mouth daily. 10/20/16   [provider]  metFORMIN (GLUCOPHAGE-XR) 750 MG 24 hr tablet Take 750 mg by mouth 2 (two) times daily. 03/21/18   [provider]  montelukast (SINGULAIR) 10 MG tablet Take 1 tablet (10 mg total) by mouth at bedtime. 09/04/20   Marcelyn Bruins, MD  olopatadine (PATANOL) 0.1 % ophthalmic solution Place 1 drop into both eyes daily as needed for allergies. 09/04/20   Marcelyn Bruins, MD  omeprazole (PRILOSEC) 20 MG capsule Take 1 capsule (20 mg total) by mouth every morning. Patient taking differently: Take 20 mg by mouth daily. 12/02/16   Marcelyn Bruins, MD  ondansetron (ZOFRAN ODT) 4 MG disintegrating tablet Take 1 tablet (4 mg total)  by mouth every 8 (eight) hours as needed for nausea or vomiting. 05/23/20   Kathryne Hitch, MD  ondansetron (ZOFRAN) 8 MG tablet Take 8 mg by mouth every 8 (eight) hours as needed for nausea or vomiting. Patient not taking: Reported on 08/22/2020    [provider]  [facility] VERIO test strip as directed.  09/01/17   [provider]  oxyCODONE (OXY IR/ROXICODONE) 5 MG immediate release tablet Take 1-2 tablets (5-10 mg total) by mouth every 4 (four) hours as needed for moderate pain (pain score 4-6). 08/12/20  Kathryne HitchBlackman, Christopher Y, MD  rosuvastatin (CRESTOR) 10 MG tablet Take 10 mg by mouth daily. 10/20/16   [provider]  tiZANidine (ZANAFLEX) 4 MG tablet TAKE 1 TABLET BY MOUTH EVERY 6 HOURS AS NEEDED FOR MUSCLE SPASMS. 09/04/20   Kathryne HitchBlackman, Christopher Y, MD    Family History Family History  Problem Relation Age of Onset   Diabetes Mother    Heart disease Mother    Diabetes Father    Eczema Sister    Asthma Brother    Asthma Son    Eczema Daughter    Allergic rhinitis Neg Hx    Angioedema Neg Hx    Immunodeficiency Neg Hx    Urticaria Neg Hx     Social History Social History   Tobacco Use   Smoking status: Never   Smokeless tobacco: Never  Vaping Use   Vaping Use: Never used  Substance Use Topics   Alcohol use: No   Drug use: No     Allergies   Amoxicillin, Lisinopril, Olmesartan, and Bactrim   Review of Systems Review of Systems  Constitutional:  Negative for fever.  Respiratory:  Positive for cough. Negative for shortness of breath.   Musculoskeletal:  Positive for back pain.    Physical Exam Triage Vital Signs ED Triage Vitals  Enc Vitals Group     BP 09/14/20 0858 132/84     Pulse Rate 09/14/20 0858 92     Resp 09/14/20 0858 18     Temp 09/14/20 0858 99.2 F (37.3 C)     Temp Source 09/14/20 0858 Oral     SpO2 09/14/20 0858 95 %     Weight --      Height --      Head Circumference --      Peak Flow --      Pain  Score 09/14/20 0901 6     Pain Loc --      Pain Edu? --      Excl. in GC? --    Updated Vital Signs BP 132/84 (BP Location: Right Arm)   Pulse 92   Temp 99.2 F (37.3 C) (Oral)   Resp 18   SpO2 95%   Visual Acuity Right Eye Distance:   Left Eye Distance:   Bilateral Distance:    Right Eye Near:   Left Eye Near:    Bilateral Near:     Physical Exam Vitals and nursing note reviewed.  Constitutional:      General: She is not in acute distress.    Appearance: Normal appearance. She is obese. She is not ill-appearing.  HENT:     Head: Normocephalic and atraumatic.  Eyes:     General:        Right eye: No discharge.        Left eye: No discharge.     Conjunctiva/sclera: Conjunctivae normal.  Cardiovascular:     Rate and Rhythm: Normal rate and regular rhythm.  Pulmonary:     Effort: Pulmonary effort is normal.     Breath sounds: No wheezing or rales.  Neurological:     Mental Status: She is alert.  Psychiatric:        Mood and Affect: Mood normal.        Behavior: Behavior normal.     UC Treatments / Results  Labs (all labs ordered are listed, but only abnormal results are displayed) Labs Reviewed - No data to display  EKG   Radiology DG Chest 2 View  Result Date: 09/14/2020 CLINICAL DATA:  50 year old female with cough for 3 weeks. Nonproductive cough. EXAM: CHEST - 2 VIEW COMPARISON:  Chest radiographs 11/12/2014 and earlier. FINDINGS: Stable lung volumes since 2016. Cardiac and mediastinal contours remain normal. Visualized tracheal air column is within normal limits. No pneumothorax, pulmonary edema, pleural effusion or confluent pulmonary opacity. Lung markings appear stable. No acute osseous abnormality identified. Negative visible bowel gas pattern. IMPRESSION: Stable since 2016 and negative.  No cardiopulmonary abnormality. Electronically Signed   By: Odessa Fleming M.D.   On: 09/14/2020 09:31    Procedures Procedures (including critical care  time)  Medications Ordered in UC Medications - No data to display  Initial Impression / Assessment and Plan / UC Course  I have reviewed the triage vital signs and the nursing notes.  Pertinent labs & imaging results that were available during my care of the patient were reviewed by me and considered in my medical decision making (see chart for details).    50 year old female presents with persistent cough following diagnosis of COVID-19.  Chest x-ray was obtained.  Independent interpretation: No acute cardiopulmonary process.  No evidence of pneumonia.  Treating with Promethazine DM.  Advised patient to call her primary to discuss possibility of corticosteroids (in the setting of diabetes; I cannot access her most recent A1c).   Final Clinical Impressions(s) / UC Diagnoses   Final diagnoses:  Post-COVID chronic cough     Discharge Instructions      Medication as prescribed.  Call PCP tomorrow to discuss possibility of steroids.  Take care  Dr. Adriana Simas      ED Prescriptions     Medication Sig Dispense Auth. Provider   promethazine-dextromethorphan (PROMETHAZINE-DM) 6.25-15 MG/5ML syrup Take 5 mLs by mouth 4 (four) times daily as needed for cough. 118 mL Tommie Sams, DO      PDMP not reviewed this encounter.   Everlene Other [place], [place] 09/14/20 [phone]

## 2020-09-14 NOTE — ED Triage Notes (Signed)
Patient presents with complaints of dry, hacking cough x 3 weeks with no relief. Patient states she was prescribed Hycodan cough syrup, tried OTC NyQuil but has not had any relief after trying those. Patient states the cough causes chest and back pain, she gets no rest at all.   Patient denies: fever, shortness of breath, chest pain, blurry vision

## 2020-09-14 NOTE — Discharge Instructions (Addendum)
Medication as prescribed.  Call PCP tomorrow to discuss possibility of steroids.  Take care  Dr. Adriana Simas

## 2020-09-22 ENCOUNTER — Encounter: Payer: Self-pay | Admitting: Physical Medicine and Rehabilitation

## 2020-10-05 NOTE — Patient Instructions (Addendum)
1. Asthma - Stop Advair 250/50  -Start Symbicort 160/4.5 mcg 2 puffs twice a day with spacer to help prevent cough and wheeze.  Lets see if your insurance will approve this medication. -We will get lab work today to see if you qualify for a biologic if needed in the future your asthma.  We will call you with results once they are back. - continue Singulair (Montelukast) 10mg  nightly - have access to albuterol inhaler 2 puffs every 4-6 hours as needed for cough/wheeze/shortness of breath/chest tightness.  May use 15-20 minutes prior to activity.   Monitor frequency of use.  - recommend use of your albuterol prior to outdoor activity, grilling etc - continue to do your best to avoid fragrance products  Asthma control goals:  Full participation in all desired activities (may need albuterol before activity) Albuterol use two time or less a week on average (not counting use with activity) Cough interfering with sleep two time or less a month Oral steroids no more than once a year No hospitalizations   2. Allergic Rhinitis with conjunctivitis - continue allergen avoidance measures - continue Zyrtec (Cetirizine) 10 mg daily  - continue Singulair (montelukast) nightly - for nasal drainage/post-nasal drip use nasal Atrovent 0.06% 2 sprays each nostril up to 3-4 times a day as needed - use over-the-counter Pataday Xtra Strength, Pataday 1 drop each eye daily or Zaditor or Alaway 1 drop each eye twice a day as needed for itchy/watery/red eyes.   These are all over-the-counter allergy eyedrop you can use.   Possible food allergy Avoid peanuts and tree nuts. In case of an allergic reaction, give Benadryl 4 teaspoonfuls every 4 hours, and if life-threatening symptoms occur, inject with EpiPen 0.3 mg. Emergency action plan given along with demonstration of how to use EpiPen Since we are not able to skin test today, we will get blood work.  We will call you with results once they are all back.   Follow  up within 2 months or sooner if needed

## 2020-10-06 ENCOUNTER — Other Ambulatory Visit: Payer: Self-pay

## 2020-10-06 ENCOUNTER — Ambulatory Visit (INDEPENDENT_AMBULATORY_CARE_PROVIDER_SITE_OTHER): Payer: Managed Care, Other (non HMO) | Admitting: Orthopaedic Surgery

## 2020-10-06 ENCOUNTER — Encounter: Payer: Self-pay | Admitting: Family

## 2020-10-06 ENCOUNTER — Encounter: Payer: Self-pay | Admitting: Orthopaedic Surgery

## 2020-10-06 ENCOUNTER — Ambulatory Visit (INDEPENDENT_AMBULATORY_CARE_PROVIDER_SITE_OTHER): Payer: Managed Care, Other (non HMO) | Admitting: Family

## 2020-10-06 VITALS — BP 128/80 | HR 96 | Temp 98.0°F | Resp 16 | Ht 65.0 in | Wt 281.6 lb

## 2020-10-06 DIAGNOSIS — H1013 Acute atopic conjunctivitis, bilateral: Secondary | ICD-10-CM | POA: Diagnosis not present

## 2020-10-06 DIAGNOSIS — J3089 Other allergic rhinitis: Secondary | ICD-10-CM | POA: Diagnosis not present

## 2020-10-06 DIAGNOSIS — T7800XA Anaphylactic reaction due to unspecified food, initial encounter: Secondary | ICD-10-CM

## 2020-10-06 DIAGNOSIS — J4540 Moderate persistent asthma, uncomplicated: Secondary | ICD-10-CM

## 2020-10-06 DIAGNOSIS — M5431 Sciatica, right side: Secondary | ICD-10-CM | POA: Diagnosis not present

## 2020-10-06 DIAGNOSIS — J4520 Mild intermittent asthma, uncomplicated: Secondary | ICD-10-CM

## 2020-10-06 DIAGNOSIS — M4807 Spinal stenosis, lumbosacral region: Secondary | ICD-10-CM

## 2020-10-06 MED ORDER — IPRATROPIUM BROMIDE 0.06 % NA SOLN: NASAL | 1 refills | Status: DC

## 2020-10-06 MED ORDER — SPACER/AERO-HOLDING CHAMBERS DEVI: 1.0000 | 0 refills | Status: AC

## 2020-10-06 MED ORDER — MONTELUKAST SODIUM 10 MG PO TABS
10.0000 mg | ORAL_TABLET | Freq: Every day | ORAL | 1 refills | Status: DC
Start: 2020-10-06 — End: 2020-12-25

## 2020-10-06 MED ORDER — EPINEPHRINE 0.3 MG/0.3ML IJ SOAJ: 0.3000 mg | INTRAMUSCULAR | 1 refills | Status: DC | PRN

## 2020-10-06 MED ORDER — BACLOFEN 10 MG PO TABS: 10.0000 mg | ORAL_TABLET | Freq: Three times a day (TID) | ORAL | 1 refills | Status: DC | PRN

## 2020-10-06 MED ORDER — BUDESONIDE-FORMOTEROL FUMARATE 160-4.5 MCG/ACT IN AERO: 2.0000 | INHALATION_SPRAY | Freq: Two times a day (BID) | RESPIRATORY_TRACT | 5 refills | Status: DC

## 2020-10-06 NOTE — Progress Notes (Signed)
Continues to have significant low back pain with spasms and right-sided sciatica.  She has had an epidural steroid in the past which helped but this time her insurance company would not allow this or approve it.  I talked her in length.  On exam she definitely has signs and symptoms consistent with low back spasms of the right side and sciatica.  She has had previous multiple hip surgeries on the right hip and her hip seems to be getting better significantly.  It is a low back to the right side and sciatica that is causing her the most difficulties on slowing her down.  Right operative hip moves smoothly and fluidly.  She is less sensitive over the trochanteric area and more of her pain seems to be sciatic related and low back pain to the right side with spasms.  At this point we will try baclofen as a muscle relaxant and I would like to send her to outpatient physical therapy to only work on her right-sided sciatica and low back.  We will then see her in follow-up in 4 weeks and if she has not made improvements once again recommend a right-sided lumbar epidural steroid injection.

## 2020-10-06 NOTE — Progress Notes (Signed)
[address] [place] [address] Dept: [phone]  FOLLOW UP NOTE  Patient ID: Emily Hester, female    DOB: 1970/05/19  Age: 50 y.o. MRN: [id number] Date of Office Visit: 10/06/2020  Assessment  Chief Complaint: Allergic Rhinitis  (Medications have been helping no issues ) and Asthma (ACT -17 Some shortness of breath and coughing due to heat )  HPI Emily Hester is a 50 year old female who presents today for skin testing to foods.  She was last seen on August 22, 2020 by Dr. Delorse Lek for asthma and allergic rhinitis with conjunctivitis.  She reports that approximately a week or so ago she was eating some pistachios and after swallowing her tongue started to swell and itch.  She then noticed that she had a little bit of thrush on her tongue and wonders if this was the cause.  She now does not see the thrush and was not treated for thrush.  She did not take any Benadryl because she had just taken her allergy medications.  She does not have an EpiPen.  Her symptoms were gone within 30 minutes.  She denies any concomitant gastrointestinal and cardiorespiratory symptoms.  She reports that she has had pistachios in the past without any problems.  Since then she has been avoiding peanuts and all nuts.  They only other food that she was avoiding is pork and this is due to migraines.  Asthma is reported as not well controlled with Advair 250/50 1 puff twice a day and albuterol as needed.  She reports that she continues to have a dry cough, but is better than what it was.  She also reports some shortness of breath and nocturnal awakenings due to breathing problems.  She denies any wheezing or tightness in her chest.  Since her last office visit she has not required any systemic steroids or made any trips to the emergency room or urgent care due to breathing problems.  She has not used any albuterol in the past couple days but prior to that she has been using her albuterol once to  twice a day.  She reports that in the past, several years ago, she has tried Symbicort and Dulera but had to change due to insurance coverage.  Allergic rhinitis with conjunctivitis is reported as moderately controlled with Zyrtec 10 mg once a day, Singulair 10 mg at night and Atrovent nasal spray once a day.  Discussed how she could use her Atrovent nasal spray up to 3-4 times a day as needed for the drainage.  She reports some postnasal drip and denies rhinorrhea and nasal congestion.  Since her last office visit she has not had a sinus infection.   Drug Allergies:  Allergies  Allergen Reactions   Amoxicillin Hives, Swelling and Other (See Comments)    Has patient had a PCN reaction causing immediate rash, facial/tongue/throat swelling, SOB or lightheadedness with hypotension: Yes Has patient had a PCN reaction causing severe rash involving mucus membranes or skin necrosis: No Has patient had a PCN reaction that required hospitalization: Yes Has patient had a PCN reaction occurring within the last 10 years: No  If all of the above answers are "NO", then may proceed with Cephalosporin use.    Lisinopril Itching and Cough    Heart racing    Olmesartan     jittery   Bactrim Rash    Review of Systems: Review of Systems  Constitutional:  Negative for chills and fever.  HENT:  Reports some postnasal drip and denies rhinorrhea and nasal congestion  Eyes:        Denies itchy watery eyes  Respiratory:  Positive for cough and shortness of breath. Negative for wheezing.        Reports a dry cough that has gotten better, some shortness of breath, and nocturnal awakenings all the time due to breathing problems.  She denies any wheezing, or tightness in her chest.  Cardiovascular:  Negative for chest pain and palpitations.  Gastrointestinal:        Denies heartburn and reflux symptoms  Genitourinary:  Negative for dysuria.  Skin:  Negative for itching and rash.  Neurological:  Negative  for headaches.  Endo/Heme/Allergies:  Positive for environmental allergies.    Physical Exam: BP 128/80   Pulse 96   Temp 98 F (36.7 C)   Resp 16   Ht 5\' 5"  (1.651 m)   Wt 281 lb 9.6 oz (127.7 kg)   SpO2 97%   BMI 46.86 kg/m    Physical Exam Constitutional:      Appearance: Normal appearance.  HENT:     Head: Normocephalic and atraumatic.     Comments: Pharynx normal, eyes normal, ears normal, nose normal    Right Ear: Tympanic membrane, ear canal and external ear normal.     Left Ear: Tympanic membrane, ear canal and external ear normal.     Nose: Nose normal.     Mouth/Throat:     Mouth: Mucous membranes are moist.     Pharynx: Oropharynx is clear.     Comments: No thrush noted Eyes:     Conjunctiva/sclera: Conjunctivae normal.  Cardiovascular:     Rate and Rhythm: Regular rhythm.     Heart sounds: Normal heart sounds.  Pulmonary:     Effort: Pulmonary effort is normal.     Breath sounds: Normal breath sounds.     Comments: Lungs clear to auscultation Musculoskeletal:     Cervical back: Neck supple.  Skin:    General: Skin is warm.  Neurological:     Mental Status: She is alert and oriented to person, place, and time.  Psychiatric:        Mood and Affect: Mood normal.        Behavior: Behavior normal.        Thought Content: Thought content normal.        Judgment: Judgment normal.    Diagnostics: FVC 1.96 L, FEV1 1.68 L.  Predicted FVC 3.12 L, predicted FEV1 2.50 L.  Spirometry indicates moderate restriction.  Spirometry is consistent with previous spirometry.  Assessment and Plan: 1. Not well controlled moderate persistent asthma   2. Allergy with anaphylaxis due to food   3. Non-seasonal allergic rhinitis due to other allergic trigger   4. Allergic conjunctivitis of both eyes   5. Mild intermittent asthma, uncomplicated     No orders of the defined types were placed in this encounter.   Patient Instructions  1. Asthma - Stop Advair 250/50   -Start Symbicort 160/4.5 mcg 2 puffs twice a day with spacer to help prevent cough and wheeze.  Lets see if your insurance will approve this medication. -We will get lab work today to see if you qualify for a biologic if needed in the future your asthma.  We will call you with results once they are back. - continue Singulair (Montelukast) 10mg  nightly - have access to albuterol inhaler 2 puffs every 4-6 hours as needed for cough/wheeze/shortness of breath/chest  tightness.  May use 15-20 minutes prior to activity.   Monitor frequency of use.  - recommend use of your albuterol prior to outdoor activity, grilling etc - continue to do your best to avoid fragrance products  Asthma control goals:  Full participation in all desired activities (may need albuterol before activity) Albuterol use two time or less a week on average (not counting use with activity) Cough interfering with sleep two time or less a month Oral steroids no more than once a year No hospitalizations   2. Allergic Rhinitis with conjunctivitis - continue allergen avoidance measures - continue Zyrtec (Cetirizine) 10 mg daily  - continue Singulair (montelukast) nightly - for nasal drainage/post-nasal drip use nasal Atrovent 0.06% 2 sprays each nostril up to 3-4 times a day as needed - use over-the-counter Pataday Xtra Strength, Pataday 1 drop each eye daily or Zaditor or Alaway 1 drop each eye twice a day as needed for itchy/watery/red eyes.   These are all over-the-counter allergy eyedrop you can use.   Possible food allergy Avoid peanuts and tree nuts. In case of an allergic reaction, give Benadryl 4 teaspoonfuls every 4 hours, and if life-threatening symptoms occur, inject with EpiPen 0.3 mg. Emergency action plan given along with demonstration of how to use EpiPen Since we are not able to skin test today, we will get blood work.  We will call you with results once they are all back.   Follow up within 2 months or sooner if  needed Return in about 2 months (around 12/07/2020), or if symptoms worsen or fail to improve.    Thank you for the opportunity to care for this patient.  Please do not hesitate to contact me with questions.  Nehemiah Settle, FNP [facility]

## 2020-10-07 ENCOUNTER — Telehealth: Payer: Self-pay | Admitting: Family

## 2020-10-07 NOTE — Telephone Encounter (Signed)
Sent in generic Symbicort 160 mcg with a 90 day supply. Spoke with patient she states that Epi-Pen needed a PA and the pharmacy was faxing something over to be filled out. Chrissy I was wondering if we needed to send her in an Auvi-Q instead. Please advise. Thanks

## 2020-10-07 NOTE — Telephone Encounter (Signed)
Patient called after speaking to pharmacy, patient is needing a prior authorization to be done for her epipen.   Patient also stated that her insurance will cover the generic of symbicort, however she needs a 90 day supply sent in for her.  [facility] [address]

## 2020-10-07 NOTE — Telephone Encounter (Signed)
Please see above.  Thank you!

## 2020-10-08 MED ORDER — EPINEPHRINE 0.3 MG/0.3ML IJ SOAJ: 0.3000 mg | Freq: Once | INTRAMUSCULAR | 2 refills | Status: AC

## 2020-10-08 NOTE — Telephone Encounter (Signed)
Yes, please let her know that we will call with results once they are all back. Thank you.

## 2020-10-08 NOTE — Telephone Encounter (Signed)
Please send in a prescription for Auvi-q 0.3 mg. Thank you!

## 2020-10-09 LAB — CBC WITH DIFFERENTIAL
Basophils Absolute: 0 10*3/uL (ref 0.0–0.2)
Basos: 0 %
EOS (ABSOLUTE): 0.1 10*3/uL (ref 0.0–0.4)
Eos: 1 %
Hematocrit: 40.2 % (ref 34.0–46.6)
Hemoglobin: 12.9 g/dL (ref 11.1–15.9)
Immature Grans (Abs): 0 10*3/uL (ref 0.0–0.1)
Immature Granulocytes: 0 %
Lymphocytes Absolute: 2.3 10*3/uL (ref 0.7–3.1)
Lymphs: 25 %
MCH: 26 pg — ABNORMAL LOW (ref 26.6–33.0)
MCHC: 32.1 g/dL (ref 31.5–35.7)
MCV: 81 fL (ref 79–97)
Monocytes Absolute: 0.5 10*3/uL (ref 0.1–0.9)
Monocytes: 6 %
Neutrophils Absolute: 6.1 10*3/uL (ref 1.4–7.0)
Neutrophils: 68 %
RBC: 4.97 x10E6/uL (ref 3.77–5.28)
RDW: 16 % — ABNORMAL HIGH (ref 11.7–15.4)
WBC: 9.1 10*3/uL (ref 3.4–10.8)

## 2020-10-09 LAB — ALLERGENS, ZONE 2

## 2020-10-09 LAB — IGE NUT PROF. W/COMPONENT RFLX
F017-IgE Hazelnut (Filbert): <0.1 kU/L
F018-IgE Brazil Nut: <0.1 kU/L
F020-IgE Almond: <0.1 kU/L
F202-IgE Cashew Nut: <0.1 kU/L
F203-IgE Pistachio Nut: <0.1 kU/L
F256-IgE Walnut: <0.1 kU/L
Macadamia Nut, IgE: <0.1 kU/L
Peanut, IgE: <0.1 kU/L
Pecan Nut IgE: <0.1 kU/L

## 2020-10-09 NOTE — Addendum Note (Signed)
Addended by: Nehemiah Settle on: 10/09/2020 09:59 AM   Modules accepted: Orders

## 2020-10-13 ENCOUNTER — Ambulatory Visit: Payer: Managed Care, Other (non HMO) | Attending: Orthopaedic Surgery

## 2020-10-13 ENCOUNTER — Other Ambulatory Visit: Payer: Self-pay

## 2020-10-13 DIAGNOSIS — M6281 Muscle weakness (generalized): Secondary | ICD-10-CM | POA: Diagnosis present

## 2020-10-13 DIAGNOSIS — R262 Difficulty in walking, not elsewhere classified: Secondary | ICD-10-CM | POA: Diagnosis present

## 2020-10-13 DIAGNOSIS — M546 Pain in thoracic spine: Secondary | ICD-10-CM | POA: Diagnosis present

## 2020-10-13 DIAGNOSIS — M5441 Lumbago with sciatica, right side: Secondary | ICD-10-CM | POA: Diagnosis present

## 2020-10-13 DIAGNOSIS — M62830 Muscle spasm of back: Secondary | ICD-10-CM | POA: Insufficient documentation

## 2020-10-13 DIAGNOSIS — G8929 Other chronic pain: Secondary | ICD-10-CM | POA: Insufficient documentation

## 2020-10-13 NOTE — Telephone Encounter (Signed)
Patient called back regarding her lab results.   Please Advise  
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
## 2020-10-13 NOTE — Telephone Encounter (Signed)
Called patient back to inform her that her lab results are not ready yet. I also gave her the number to set up her Auvi-Q shipment.

## 2020-10-14 LAB — SPECIMEN STATUS REPORT

## 2020-10-14 LAB — IGE: IgE (Immunoglobulin E), Serum: 23 [IU]/mL (ref 6–495)

## 2020-10-14 NOTE — Telephone Encounter (Signed)
Noted. Thank you. Lab results are all back and I left results.

## 2020-10-14 NOTE — Progress Notes (Signed)
Please let Emily Hester know that her lab work for  peanuts and tree nuts were negative. This includes pistachios. Please have her continue to avoid peanuts and tree nuts until she is able to schedule an appointment for skin testing. She will need to be off all antihistamines 3 days prior to this appointment.  Her lab work for environmental inhalents was negative. Also, her lab work looking to see if she would qualify for a biologic in the future if needed for her asthma was normal.  How has her breathing been since she started Symbicort 160/4.5 mcg 2 puffs twice a day with spacer?

## 2020-10-14 NOTE — Progress Notes (Signed)
Noted. She has already done the lab work to see if she qualifies for a biologic for her asthma and she does not qualify because that lab work was normal.

## 2020-10-15 ENCOUNTER — Other Ambulatory Visit: Payer: Self-pay

## 2020-10-15 ENCOUNTER — Ambulatory Visit: Payer: Managed Care, Other (non HMO)

## 2020-10-15 DIAGNOSIS — M6281 Muscle weakness (generalized): Secondary | ICD-10-CM

## 2020-10-15 DIAGNOSIS — R262 Difficulty in walking, not elsewhere classified: Secondary | ICD-10-CM

## 2020-10-15 DIAGNOSIS — G8929 Other chronic pain: Secondary | ICD-10-CM

## 2020-10-15 DIAGNOSIS — M62830 Muscle spasm of back: Secondary | ICD-10-CM

## 2020-10-15 DIAGNOSIS — M546 Pain in thoracic spine: Secondary | ICD-10-CM

## 2020-10-15 DIAGNOSIS — M5441 Lumbago with sciatica, right side: Secondary | ICD-10-CM | POA: Diagnosis not present

## 2020-10-15 NOTE — Therapy (Signed)
[facility] [address] [place], [place], [address] Phone: [phone]   Fax:  [phone]  Physical Therapy Treatment  Patient Details  Name: Emily Hester MRN: [id number] Date of Birth: 07-Jan-1971 Referring Provider (PT): Kathryne Hitch,   Encounter Date: 10/15/2020   PT End of Session - 10/15/20 1014     Visit Number 2    Number of Visits 9    Date for PT Re-Evaluation 11/15/20    Authorization Type [employer]    PT Start Time 1015    PT Stop Time 1059    PT Time Calculation (min) 44 min    Activity Tolerance Patient limited by pain;Patient tolerated treatment well    Behavior During Therapy [facility] for tasks assessed/performed             Past Medical History:  Diagnosis Date   Arthritis    patient denies   Asthma    Diabetes mellitus without complication (HCC)    type    GERD (gastroesophageal reflux disease)    Hypertension    Interstitial cystitis    IUD (intrauterine device) in place placed 6 weeks ago   Migraines    MIgraines- not current     Pneumonia     Past Surgical History:  Procedure Laterality Date   ANTERIOR HIP REVISION Right 05/20/2020   Procedure: RIGHT TOTAL HIP REVISION- ploy exhange and hip ball exchange with bone graft;  Surgeon: Kathryne Hitch, MD;  Location: [facility] OR;  Service: Orthopedics;  Laterality: Right;   DILATION AND EVACUATION  09/30/2011   Procedure: DILATATION AND EVACUATION;  Surgeon: Rheya Andrea, MD;  Location: [facility];  Service: Gynecology;  Laterality: N/A;   left knee surgery      arthroscopy    TOTAL HIP ARTHROPLASTY Right 03/11/2017   Procedure: RIGHT TOTAL HIP ARTHROPLASTY ANTERIOR APPROACH;  Surgeon: Kathryne Hitch, MD;  Location: [facility];  Service: Orthopedics;  Laterality: Right;    There were no vitals filed for this visit.   Subjective Assessment - 10/15/20 1015     Subjective Patient reports the back pain is worse, but she started  using her cane since the evaluation and this is helping with the hip. She has been able to do one set of her HEP, but not two due to pain and stiffness.    Pertinent History recent rt total hip revision    Limitations Standing;Walking;House hold activities    How long can you sit comfortably? 20 minutes    How long can you stand comfortably? 10 minutes    How long can you walk comfortably? about 15 minutes    Patient Stated Goals I want to get out of pain    Currently in Pain? Yes    Pain Score 10-Worst pain ever    Pain Location Thoracic    Pain Orientation Lower;Mid    Pain Descriptors / Indicators Throbbing    Pain Type Chronic pain    Pain Radiating Towards anterolateral RLE (shocking sensation)    Pain Onset More than a month ago    Pain Frequency Constant    Aggravating Factors  bending, movement    Pain Relieving Factors sitting straight                               [facility] Adult PT Treatment/Exercise - 10/15/20 0001       Self-Care   Other Self-Care  Comments  see patient education      Lumbar Exercises: Seated   Other Seated Lumbar Exercises repeated trunk flexion 2  x10      Lumbar Exercises: Prone   Other Prone Lumbar Exercises prone on elbows 3 minutes    Other Prone Lumbar Exercises prone pressup partial range 1 x10      Manual Therapy   Manual Therapy Joint mobilization;Soft tissue mobilization    Joint Mobilization Grade II CPAs Mid/lower T-spine, L-spine    Soft tissue mobilization STM to bilateral lumbothoracic paraspinals, bilateral posterior glutes and piriformis                    PT Education - 10/15/20 1140     Education Details Reviewed FOTO, education on centralization phenomenon, updated HEP.    Person(s) Educated Patient    Methods Explanation;Demonstration;Handout    Comprehension Verbalized understanding;Returned demonstration              PT Short Term Goals - 10/13/20 1340       PT SHORT TERM GOAL #1    Title Patient will be independent with initial HEP.    Baseline issued at eval    Time 2    Period Weeks    Status New    Target Date 10/27/20      PT SHORT TERM GOAL #2   Title Therapist will review FOTO and anticipated functional progress.    Baseline captured at eval.    Time 2    Period Weeks    Status New    Target Date 10/27/20               PT Long Term Goals - 10/13/20 1342       PT LONG TERM GOAL #1   Title Patient will increase lumbar flexion and extension AROM by 25% without significant increase in pain to improve ability to complete bending and reaching tasks.    Baseline see flowsheet    Time 4    Period Weeks    Status New    Target Date 11/10/20      PT LONG TERM GOAL #2   Title Patient will have no referrd pain with lumbar PAIVM assessment indicative of improvements in her overall condition.    Baseline referred pain to the dorsum of the Rt foot with CPAs at L2-L5    Time 4    Period Weeks    Status New    Target Date 11/10/20      PT LONG TERM GOAL #3   Title Patient will score at least 54% function on FOTO to signify clinically meaningful improvement in functional abilities.    Baseline 39%    Time 4    Period Weeks    Status New    Target Date 11/10/20      PT LONG TERM GOAL #4   Title Patient will be independent with advanced home program to assist in management of her chronic condition.    Baseline n/a    Time 6    Period Weeks    Status New    Target Date 11/24/20                   Plan - 10/15/20 1044     Clinical Impression Statement Patient arrives with 10/10 mid back pain and radicular pain about the anterolateral RLE with patient reporting that bending is an aggravating factor. She tolerated manual therapy well with notable tautness and  palpable tenderness about bilateral paraspinals as well as Rt gluteal/piriformis musculature. Given that patient reported that bending increased her pain, trialed extension biased  movement initially, though this does not appear to centralize/abolish her pain. Trialed flexion biased movement as well that did reduce her back pain to 6/10. Instructed patient on completing repeated trunk flexion as part of HEP to assist in overall pain reduction.    Personal Factors and Comorbidities Time since onset of injury/illness/exacerbation;Other;Fitness   recent total hip revision   Examination-Activity Limitations Bend;Lift;Stairs;Stand;Locomotion Level;Transfers    Examination-Participation Restrictions Cleaning;Shop    Stability/Clinical Decision Making Evolving/Moderate complexity    Rehab Potential Good    PT Frequency 2x / week    PT Duration 4 weeks    PT Treatment/Interventions ADLs/Self Care Home Management;Cryotherapy;Publishing copy;Therapeutic activities;Therapeutic exercise;Balance training;Neuromuscular re-education;Patient/family education;Manual techniques;Passive range of motion;Dry needling;Taping    PT Next Visit Plan review HEP. manual to thoracic/lumbar/hips as tolerated, consider TPDN.    PT Home Exercise Plan Access Code [email or web address]    Consulted and Agree with Plan of Care Patient             Patient will benefit from skilled therapeutic intervention in order to improve the following deficits and impairments:  Abnormal gait, Difficulty walking, Decreased range of motion, Decreased endurance, Decreased activity tolerance, Pain, Hypomobility, Impaired flexibility, Improper body mechanics, Decreased strength  Visit Diagnosis: Chronic bilateral low back pain with right-sided sciatica  Pain in thoracic spine  Muscle weakness (generalized)  Difficulty in walking, not elsewhere classified  Muscle spasm of back     Problem List 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HCC) 05/20/2020   Status post revision of total hip 05/20/2020   Trochanteric bursitis, right  hip 09/05/2019   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   Unilateral primary osteoarthritis, right hip 01/27/2017   Letitia Libra, PT, DPT, ATC 10/15/20 11:43 AM  [facility] [address] [place], [place], [address] Phone: [phone]   Fax:  [phone]  Name: Emily Hester MRN: [id number] Date of Birth: April 22, 1970

## 2020-10-16 ENCOUNTER — Encounter: Payer: Self-pay | Admitting: Orthopaedic Surgery

## 2020-10-16 MED ORDER — GABAPENTIN 100 MG PO CAPS: 100.0000 mg | ORAL_CAPSULE | Freq: Every day | ORAL | 0 refills | Status: DC

## 2020-10-20 ENCOUNTER — Encounter: Payer: Self-pay | Admitting: Physical Therapy

## 2020-10-20 ENCOUNTER — Ambulatory Visit: Payer: Managed Care, Other (non HMO) | Admitting: Physical Therapy

## 2020-10-20 ENCOUNTER — Other Ambulatory Visit: Payer: Self-pay

## 2020-10-20 DIAGNOSIS — M546 Pain in thoracic spine: Secondary | ICD-10-CM

## 2020-10-20 DIAGNOSIS — M6281 Muscle weakness (generalized): Secondary | ICD-10-CM

## 2020-10-20 DIAGNOSIS — R262 Difficulty in walking, not elsewhere classified: Secondary | ICD-10-CM

## 2020-10-20 DIAGNOSIS — M5441 Lumbago with sciatica, right side: Secondary | ICD-10-CM

## 2020-10-20 DIAGNOSIS — G8929 Other chronic pain: Secondary | ICD-10-CM

## 2020-10-20 NOTE — Therapy (Signed)
[facility] Outpatient Rehabilitation [facility] [address] [place], [place], [address] Phone: [phone]   Fax:  [phone]  Physical Therapy Treatment  Patient Details  Name: Emily Hester MRN: [id number] Date of Birth: 1970/04/26 Referring Provider (PT): Kathryne Hitch,   Encounter Date: 10/20/2020   PT End of Session - 10/20/20 1503     Visit Number 3    Number of Visits 9    Date for PT Re-Evaluation 11/15/20    Authorization Type [employer]    PT Start Time 1502    PT Stop Time 1542    PT Time Calculation (min) 40 min    Activity Tolerance Patient limited by pain;Patient tolerated treatment well    Behavior During Therapy [facility] for tasks assessed/performed             Past Medical History:  Diagnosis Date   Arthritis    patient denies   Asthma    Diabetes mellitus without complication (HCC)    type    GERD (gastroesophageal reflux disease)    Hypertension    Interstitial cystitis    IUD (intrauterine device) in place placed 6 weeks ago   Migraines    MIgraines- not current     Pneumonia     Past Surgical History:  Procedure Laterality Date   ANTERIOR HIP REVISION Right 05/20/2020   Procedure: RIGHT TOTAL HIP REVISION- ploy exhange and hip ball exchange with bone graft;  Surgeon: Kathryne Hitch, MD;  Location: [facility] OR;  Service: Orthopedics;  Laterality: Right;   DILATION AND EVACUATION  09/30/2011   Procedure: DILATATION AND EVACUATION;  Surgeon: Lajuanna Andrea, MD;  Location: [facility] ORS;  Service: Gynecology;  Laterality: N/A;   left knee surgery      arthroscopy    TOTAL HIP ARTHROPLASTY Right 03/11/2017   Procedure: RIGHT TOTAL HIP ARTHROPLASTY ANTERIOR APPROACH;  Surgeon: Kathryne Hitch, MD;  Location: [facility] ORS;  Service: Orthopedics;  Laterality: Right;    There were no vitals filed for this visit.   Subjective Assessment - 10/20/20 1503     Subjective "The pain has got worse. I feel it now with both legs,  and L groin pain in the front of the hip."    Currently in Pain? Yes    Pain Score 10-Worst pain ever   pt declined going to the hospital   Pain Orientation Lower;Mid    Pain Descriptors / Indicators Aching;Throbbing;Sore    Pain Type Chronic pain    Pain Onset More than a month ago    Pain Frequency Intermittent                [facility] PT Assessment - 10/20/20 0001       Assessment   Medical Diagnosis M48.07 (ICD-10-CM) - Spinal stenosis of lumbosacral region    Referring Provider (PT) Kathryne Hitch,      Special Tests    Special Tests Leg LengthTest    Leg length test  True;Apparent      True   Right 85.2 in.    Left  81.2 in.                           [facility] Adult PT Treatment/Exercise - 10/20/20 0001       Self-Care   Other Self-Care Comments  benefit of heel lift. trialed stance using heel lift in the R and she noted decreased low back pain.  Lumbar Exercises: Standing   Other Standing Lumbar Exercises repeated ext in standing 4 x 15   pt reported centralization and no N/T in the legs noting it had moved to her hips.     Manual Therapy   Manual therapy comments skilled palpation and monitoring of pt througohut TPDN    Joint Mobilization grade II PA L1-L5    Soft tissue mobilization IASTM along bil lumbar paraspinals              Trigger Point Dry Needling - 10/20/20 0001     Consent Given? Yes    Education Handout Provided Yes    Muscles Treated Back/Hip Lumbar multifidi    Lumbar multifidi Response Twitch response elicited;Palpable increased muscle length   bil L4                 PT Education - 10/20/20 1528     Education Details educated benefits of TPDN. provided heel lift for the R shoe to promote pelvic equality to reduce compensation    Person(s) Educated Patient    Methods Explanation;Verbal cues    Comprehension Verbalized understanding              PT Short Term Goals - 10/13/20 1340       PT  SHORT TERM GOAL #1   Title Patient will be independent with initial HEP.    Baseline issued at eval    Time 2    Period Weeks    Status New    Target Date 10/27/20      PT SHORT TERM GOAL #2   Title Therapist will review FOTO and anticipated functional progress.    Baseline captured at eval.    Time 2    Period Weeks    Status New    Target Date 10/27/20               PT Long Term Goals - 10/13/20 1342       PT LONG TERM GOAL #1   Title Patient will increase lumbar flexion and extension AROM by 25% without significant increase in pain to improve ability to complete bending and reaching tasks.    Baseline see flowsheet    Time 4    Period Weeks    Status New    Target Date 11/10/20      PT LONG TERM GOAL #2   Title Patient will have no referrd pain with lumbar PAIVM assessment indicative of improvements in her overall condition.    Baseline referred pain to the dorsum of the Rt foot with CPAs at L2-L5    Time 4    Period Weeks    Status New    Target Date 11/10/20      PT LONG TERM GOAL #3   Title Patient will score at least 54% function on FOTO to signify clinically meaningful improvement in functional abilities.    Baseline 39%    Time 4    Period Weeks    Status New    Target Date 11/10/20      PT LONG TERM GOAL #4   Title Patient will be independent with advanced home program to assist in management of her chronic condition.    Baseline n/a    Time 6    Period Weeks    Status New    Target Date 11/24/20                   Plan - 10/20/20 1530  Clinical Impression Statement pt arrives to session increaesd pain rated at 10/10. reviewed extension trialed in standing which she noted centralization with pain located more to the anterior hip vs bil shin. reviewed and consent was given for TPDN focusing on bil lumbar parapsinals. measured her LLD which her true LLD reavaled ~4 cm difference. provided heel lift for the R foot which in session she  noted decreased low back pain in standing.    PT Treatment/Interventions ADLs/Self Care Home Management;Cryotherapy;Publishing copy;Therapeutic activities;Therapeutic exercise;Balance training;Neuromuscular re-education;Patient/family education;Manual techniques;Passive range of motion;Dry needling;Taping    PT Next Visit Plan review HEP. manual to thoracic/lumbar/hips as tolerated, consider response to TPDN, heel lift in R shoe. hows extension exercise.             Patient will benefit from skilled therapeutic intervention in order to improve the following deficits and impairments:  Abnormal gait, Difficulty walking, Decreased range of motion, Decreased endurance, Decreased activity tolerance, Pain, Hypomobility, Impaired flexibility, Improper body mechanics, Decreased strength  Visit Diagnosis: Chronic bilateral low back pain with right-sided sciatica  Pain in thoracic spine  Muscle weakness (generalized)  Difficulty in walking, not elsewhere classified     Problem List 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HCC) 05/20/2020   Status post revision of total hip 05/20/2020   Trochanteric bursitis, right hip 09/05/2019   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   Unilateral primary osteoarthritis, right hip 01/27/2017   Lulu Riding PT, DPT, LAT, ATC  10/20/20  3:45 PM      [facility] Outpatient Rehabilitation [facility] [address] [place], [place], [address] Phone: [phone]   Fax:  [phone]  Name: Emily Hester MRN: [id number] Date of Birth: 1970-03-27

## 2020-10-22 ENCOUNTER — Other Ambulatory Visit: Payer: Self-pay | Admitting: Orthopaedic Surgery

## 2020-10-22 ENCOUNTER — Encounter: Payer: Self-pay | Admitting: Orthopaedic Surgery

## 2020-10-22 MED ORDER — HYDROCODONE-ACETAMINOPHEN 5-325 MG PO TABS: 1.0000 | ORAL_TABLET | Freq: Four times a day (QID) | ORAL | 0 refills | Status: DC | PRN

## 2020-10-23 ENCOUNTER — Ambulatory Visit: Payer: Managed Care, Other (non HMO)

## 2020-10-23 ENCOUNTER — Other Ambulatory Visit: Payer: Self-pay

## 2020-10-23 DIAGNOSIS — R262 Difficulty in walking, not elsewhere classified: Secondary | ICD-10-CM

## 2020-10-23 DIAGNOSIS — M546 Pain in thoracic spine: Secondary | ICD-10-CM

## 2020-10-23 DIAGNOSIS — M62830 Muscle spasm of back: Secondary | ICD-10-CM

## 2020-10-23 DIAGNOSIS — M6281 Muscle weakness (generalized): Secondary | ICD-10-CM

## 2020-10-23 DIAGNOSIS — M5441 Lumbago with sciatica, right side: Secondary | ICD-10-CM | POA: Diagnosis not present

## 2020-10-23 DIAGNOSIS — G8929 Other chronic pain: Secondary | ICD-10-CM

## 2020-10-23 NOTE — Therapy (Signed)
[facility] Outpatient Rehabilitation [facility] [address] [place], [place], [address] Phone: [phone]   Fax:  [phone]  Physical Therapy Treatment  Patient Details  Name: Emily Hester MRN: [id number] Date of Birth: 07-22-1970 Referring Provider (PT): Kathryne Hitch,   Encounter Date: 10/23/2020   PT End of Session - 10/23/20 1016     Visit Number 4    Number of Visits 9    Date for PT Re-Evaluation 11/15/20    Authorization Type [employer]    PT Start Time 1016    PT Stop Time 1059    PT Time Calculation (min) 43 min    Activity Tolerance Patient limited by pain;Patient tolerated treatment well    Behavior During Therapy [facility] for tasks assessed/performed             Past Medical History:  Diagnosis Date   Arthritis    patient denies   Asthma    Diabetes mellitus without complication (HCC)    type    GERD (gastroesophageal reflux disease)    Hypertension    Interstitial cystitis    IUD (intrauterine device) in place placed 6 weeks ago   Migraines    MIgraines- not current     Pneumonia     Past Surgical History:  Procedure Laterality Date   ANTERIOR HIP REVISION Right 05/20/2020   Procedure: RIGHT TOTAL HIP REVISION- ploy exhange and hip ball exchange with bone graft;  Surgeon: Kathryne Hitch, MD;  Location: [facility] OR;  Service: Orthopedics;  Laterality: Right;   DILATION AND EVACUATION  09/30/2011   Procedure: DILATATION AND EVACUATION;  Surgeon: Liliane Andrea, MD;  Location: [facility];  Service: Gynecology;  Laterality: N/A;   left knee surgery      arthroscopy    TOTAL HIP ARTHROPLASTY Right 03/11/2017   Procedure: RIGHT TOTAL HIP ARTHROPLASTY ANTERIOR APPROACH;  Surgeon: Kathryne Hitch, MD;  Location: [facility];  Service: Orthopedics;  Laterality: Right;    There were no vitals filed for this visit.   Subjective Assessment - 10/23/20 1017     Subjective "I'm ok, not a 10 so that's good." She reports she is  still feeling pain in the low back, both legs and left groin. She was sore after the TPDN, but she is not having the spasms so thinks this helped to alleviate this some. She has not tried the heel lift because it's hard for her to bend and put shoes on. She reports repeated lumbar extension in standing has had no effect on her pain. She has recently noticed having difficulty emptying the bladder that she noticed when her pain in the left leg began, but is unsure if this is related to her interstitial cystitis. No saddle parathesia.    Currently in Pain? Yes    Pain Score 7     Pain Location Back    Pain Orientation Lower    Pain Descriptors / Indicators Throbbing    Pain Type Chronic pain    Pain Radiating Towards to left groin    Pain Onset More than a month ago    Pain Frequency Constant    Aggravating Factors  bending    Pain Relieving Factors ice                               [facility] Adult PT Treatment/Exercise - 10/23/20 0001       Self-Care   Self-Care  Other Self-Care Comments    Other Self-Care Comments  see patient educaiton      Lumbar Exercises: Stretches   Lower Trunk Rotation 60 seconds    Hip Flexor Stretch 60 seconds    Hip Flexor Stretch Limitations left      Lumbar Exercises: Standing   Other Standing Lumbar Exercises repeated extension in standing 2 x 10; peripheralize      Lumbar Exercises: Supine   Pelvic Tilt 10 reps    Pelvic Tilt Limitations x2    Clam 10 reps    Clam Limitations blue band x2    Bent Knee Raise 10 reps    Bent Knee Raise Limitations x2    Other Supine Lumbar Exercises 90/90 positional hold 5 minutes      Manual Therapy   Joint Mobilization Lt LAD x 1 min, no effect                    PT Education - 10/23/20 1040     Education Details Recommendation to reach out to Dr. Magnus Ivan regarding recent onset of bladder changes. Discussed red flag symptoms. Recommended to trial heel lift prior to next visit.  Reviewed HEP.    Person(s) Educated Patient    Methods Explanation    Comprehension Verbalized understanding              PT Short Term Goals - 10/23/20 1041       PT SHORT TERM GOAL #1   Title Patient will be independent with initial HEP.    Baseline cues required    Time 2    Period Weeks    Status On-going    Target Date 10/27/20      PT SHORT TERM GOAL #2   Title Therapist will review FOTO and anticipated functional progress.    Baseline captured at eval.    Time 2    Period Weeks    Status Achieved    Target Date 10/27/20               PT Long Term Goals - 10/13/20 1342       PT LONG TERM GOAL #1   Title Patient will increase lumbar flexion and extension AROM by 25% without significant increase in pain to improve ability to complete bending and reaching tasks.    Baseline see flowsheet    Time 4    Period Weeks    Status New    Target Date 11/10/20      PT LONG TERM GOAL #2   Title Patient will have no referrd pain with lumbar PAIVM assessment indicative of improvements in her overall condition.    Baseline referred pain to the dorsum of the Rt foot with CPAs at L2-L5    Time 4    Period Weeks    Status New    Target Date 11/10/20      PT LONG TERM GOAL #3   Title Patient will score at least 54% function on FOTO to signify clinically meaningful improvement in functional abilities.    Baseline 39%    Time 4    Period Weeks    Status New    Target Date 11/10/20      PT LONG TERM GOAL #4   Title Patient will be independent with advanced home program to assist in management of her chronic condition.    Baseline n/a    Time 6    Period Weeks    Status New  Target Date 11/24/20                   Plan - 10/23/20 1042     Clinical Impression Statement Patient arrives with continued high pain levels about the back and LEs, though has improved compared to last visit. She reports noticing recent difficulty with empyting her bladder,  though is unsure if this is related to her back or her interstitial cystitis. No other red flag symptoms reported. It was recommended that she reach out to Dr. Magnus Ivan regarding this new symptom. While she previously reported centralization of her pain with repeated lumbar extension in standing at last visit, during today's session this movement peripheralized her pain from her back to her Lt thigh. No effect on her pain with Lt LAD or 90/90 positional hold. She was again encouraged to trial heel lift prior to next session to determine any effects on her pain. Able to complete core stabilization and hip strengthening without increased pain today.    PT Treatment/Interventions ADLs/Self Care Home Management;Cryotherapy;Publishing copy;Therapeutic activities;Therapeutic exercise;Balance training;Neuromuscular re-education;Patient/family education;Manual techniques;Passive range of motion;Dry needling;Taping    PT Next Visit Plan review HEP. manual to thoracic/lumbar/hips as tolerated, TPDN prn, heel lift in R shoe??, did she f/u with Dr. Magnus Ivan regarding bladder changes    PT Home Exercise Plan Access Code [email or web address]    Consulted and Agree with Plan of Care Patient             Patient will benefit from skilled therapeutic intervention in order to improve the following deficits and impairments:  Abnormal gait, Difficulty walking, Decreased range of motion, Decreased endurance, Decreased activity tolerance, Pain, Hypomobility, Impaired flexibility, Improper body mechanics, Decreased strength  Visit Diagnosis: Chronic bilateral low back pain with right-sided sciatica  Pain in thoracic spine  Muscle weakness (generalized)  Difficulty in walking, not elsewhere classified  Muscle spasm of back     Problem List 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HCC) 05/20/2020   Status post revision of total  hip 05/20/2020   Trochanteric bursitis, right hip 09/05/2019   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   Unilateral primary osteoarthritis, right hip 01/27/2017   Letitia Libra, PT, DPT, ATC 10/23/20 12:04 PM   [facility] [address] [place], [place], [address] Phone: [phone]   Fax:  [phone]  Name: Emily Hester MRN: [id number] Date of Birth: Aug 05, 1970

## 2020-10-26 NOTE — Patient Instructions (Addendum)
1. Asthma -Continue Symbicort 160/4.5 mcg 2 puffs twice a day with spacer to help prevent cough and wheeze.  We will see if we can try to find you a free spacer due to the cost of the spacer being around $50. - Continue Singulair (montelukast) 10mg  nightly - have access to albuterol inhaler 2 puffs every 4-6 hours as needed for cough/wheeze/shortness of breath/chest tightness.  May use 15-20 minutes prior to activity.   Monitor frequency of use.  - recommend use of your albuterol prior to outdoor activity, grilling etc - continue to do your best to avoid fragrance products Asthma control goals:  Full participation in all desired activities (may need albuterol before activity) Albuterol use two time or less a week on average (not counting use with activity) Cough interfering with sleep two time or less a month Oral steroids no more than once a year No hospitalizations   2. Allergic Rhinitis with conjunctivitis - continue allergen avoidance measures - continue Zyrtec (Cetirizine) 10 mg daily  - continue Singulair (montelukast) nightly - for nasal drainage/post-nasal drip use nasal Atrovent 0.06% 2 sprays each nostril up to 3-4 times a day as needed - use over-the-counter Pataday Xtra Strength, Pataday 1 drop each eye daily or Zaditor or Alaway 1 drop each eye twice a day as needed for itchy/watery/red eyes.   These are all over-the-counter allergy eyedrop you can use.   Possible food allergy Continue to avoid peanuts and tree nuts. In case of an allergic reaction, give Benadryl 4 teaspoonfuls every 4 hours, and if life-threatening symptoms occur, inject with EpiPen 0.3 mg Given your negative lab work to peanuts and tree nuts and your negative skin testing today, I would recommend an in office oral challenge to pistachios.  You need to bring pistachios with you the day of your appointment.  The appointment will last approximately 3 hours and you need to be in good health the day of your  appointment.  You will also need to be off all antihistamines 3 days prior to this appointment.  Keep your already scheduled follow-up appointment with Dr. on December 25, 2020 at 11:20 AM Also at your convenience schedule an oral food challenge to pistachio.

## 2020-10-27 ENCOUNTER — Telehealth: Payer: Self-pay

## 2020-10-27 ENCOUNTER — Ambulatory Visit: Payer: Managed Care, Other (non HMO) | Admitting: Physical Therapy

## 2020-10-27 ENCOUNTER — Ambulatory Visit (INDEPENDENT_AMBULATORY_CARE_PROVIDER_SITE_OTHER): Payer: Managed Care, Other (non HMO) | Admitting: Family

## 2020-10-27 ENCOUNTER — Encounter: Payer: Self-pay | Admitting: Family

## 2020-10-27 ENCOUNTER — Encounter: Payer: Self-pay | Admitting: Physical Therapy

## 2020-10-27 ENCOUNTER — Other Ambulatory Visit: Payer: Self-pay

## 2020-10-27 VITALS — BP 128/80 | HR 90 | Temp 98.0°F | Resp 18 | Ht 65.0 in

## 2020-10-27 DIAGNOSIS — M6281 Muscle weakness (generalized): Secondary | ICD-10-CM

## 2020-10-27 DIAGNOSIS — T7800XA Anaphylactic reaction due to unspecified food, initial encounter: Secondary | ICD-10-CM

## 2020-10-27 DIAGNOSIS — H1013 Acute atopic conjunctivitis, bilateral: Secondary | ICD-10-CM

## 2020-10-27 DIAGNOSIS — J4540 Moderate persistent asthma, uncomplicated: Secondary | ICD-10-CM

## 2020-10-27 DIAGNOSIS — G8929 Other chronic pain: Secondary | ICD-10-CM

## 2020-10-27 DIAGNOSIS — M546 Pain in thoracic spine: Secondary | ICD-10-CM

## 2020-10-27 DIAGNOSIS — J3089 Other allergic rhinitis: Secondary | ICD-10-CM | POA: Diagnosis not present

## 2020-10-27 NOTE — Telephone Encounter (Signed)
Thank you for trying.

## 2020-10-27 NOTE — Telephone Encounter (Signed)
Emily Hester is also going to check in [facility] tomorrow to see if there is one that she can have.

## 2020-10-27 NOTE — Progress Notes (Signed)
[address] [place] [address] Dept: [phone]  FOLLOW UP NOTE  Patient ID: Emily Hester, female    DOB: 28-Feb-1971  Age: 50 y.o. MRN: [id number] Date of Office Visit: 10/27/2020  Assessment  Chief Complaint: Allergy Testing (Peanuts and treenuts )  HPI Emily Hester is a 50 year old female who presents today for skin testing to select foods.  She has been off all antihistamines 3 days prior to this appointment.  She was last seen on October 06, 2020 by Nehemiah Settle, FNP for not well controlled moderate persistent asthma, allergy with anaphylaxis due to food, nonseasonal allergic rhinitis due to other allergic trigger, and allergic conjunctivitis of both eyes.  She continues to avoid peanuts and tree nuts without any accidental ingestion or use of her EpiPen.  She reports that approximately a month ago she was eating pistachios and right after eating she felt like her tongue started to swell and itch.  Denies any cardiorespiratory and gastrointestinal symptoms.  She then noticed she had a little bit of thrush on her tongue and wondered if this was the cause.  She has been able to eat peanuts and tree nuts prior to this without any problems.  Moderate persistent asthma is reported as controlled with Symbicort 160/4.5 mcg 2 puffs twice a day, montelukast 10 mg once a day, and albuterol as needed.  She did not get the spacer that was sent to her pharmacy.  She reports a little bit of a dry cough when around irritant and denies any wheezing, tightness in her chest, shortness of breath, and nocturnal awakenings.  Since her last office visit she has not required any systemic steroids or made any trips to the emergency room or urgent care due to breathing problems.  She has used her albuterol inhaler once or twice since we last saw her.  Allergic rhinitis with conjunctivitis is reported as controlled with Zyrtec 10 mg once a day, Singulair 10 mg once a day, and Atrovent  0.06% nasal spray as needed.  She denies any rhinorrhea, nasal congestion, and postnasal drip.  She has not had any sinus infections since we last saw her.  She also denies itchy watery eyes   Drug Allergies:  Allergies  Allergen Reactions   Amoxicillin Hives, Swelling and Other (See Comments)    Has patient had a PCN reaction causing immediate rash, facial/tongue/throat swelling, SOB or lightheadedness with hypotension: Yes Has patient had a PCN reaction causing severe rash involving mucus membranes or skin necrosis: No Has patient had a PCN reaction that required hospitalization: Yes Has patient had a PCN reaction occurring within the last 10 years: No  If all of the above answers are "NO", then may proceed with Cephalosporin use.    Lisinopril Itching and Cough    Heart racing    Olmesartan     jittery   Bactrim Rash    Review of Systems: Review of Systems  Constitutional:  Negative for chills and fever.  HENT:         Denies rhinorrhea, nasal congestion, and postnasal drip.  Eyes:        Denies itchy watery eyes  Respiratory:  Positive for cough. Negative for shortness of breath and wheezing.        Reports occasional dry cough when she is around an irritant.  Denies wheezing, tightness in her chest, shortness of breath, and nocturnal awakenings due to breathing problems  Cardiovascular:  Negative for chest pain and palpitations.  Gastrointestinal:        Denies heartburn and reflux symptoms  Genitourinary:  Negative for dysuria.  Skin:  Negative for itching and rash.  Neurological:  Negative for headaches.  Endo/Heme/Allergies:  Positive for environmental allergies.    Physical Exam: BP 128/80   Pulse 90   Temp 98 F (36.7 C)   Resp 18   Ht 5\' 5"  (1.651 m)   SpO2 96%   BMI 46.86 kg/m    Physical Exam Constitutional:      Appearance: Normal appearance.  HENT:     Head: Normocephalic and atraumatic.     Comments: Pharynx normal, eyes normal, ears normal, nose:  Bilateral lower turbinates mildly edematous with no drainage noted    Right Ear: Tympanic membrane, ear canal and external ear normal.     Left Ear: Tympanic membrane, ear canal and external ear normal.     Mouth/Throat:     Mouth: Mucous membranes are moist.     Pharynx: Oropharynx is clear.  Eyes:     Conjunctiva/sclera: Conjunctivae normal.  Cardiovascular:     Rate and Rhythm: Normal rate and regular rhythm.     Heart sounds: Normal heart sounds.  Pulmonary:     Effort: Pulmonary effort is normal.     Breath sounds: Normal breath sounds.     Comments: Lungs clear to auscultation Musculoskeletal:     Cervical back: Neck supple.  Skin:    General: Skin is warm.  Neurological:     Mental Status: She is alert and oriented to person, place, and time.  Psychiatric:        Mood and Affect: Mood normal.        Behavior: Behavior normal.        Thought Content: Thought content normal.        Judgment: Judgment normal.    Diagnostics: Percutaneous skin testing today was negative to peanut, cashew, pecan, walnut, almond, hazelnut, nut, coconut, and pistachio with a good histamine response.  Assessment and Plan: 1. Allergy with anaphylaxis due to food   2. Moderate persistent asthma without complication   3. Non-seasonal allergic rhinitis due to other allergic trigger   4. Allergic conjunctivitis of both eyes     No orders of the defined types were placed in this encounter.   Patient Instructions  1. Asthma -Continue Symbicort 160/4.5 mcg 2 puffs twice a day with spacer to help prevent cough and wheeze.  We will see if we can try to find you a free spacer due to the cost of the spacer being around $50. - Continue Singulair (montelukast) 10mg  nightly - have access to albuterol inhaler 2 puffs every 4-6 hours as needed for cough/wheeze/shortness of breath/chest tightness.  May use 15-20 minutes prior to activity.   Monitor frequency of use.  - recommend use of your  albuterol prior to outdoor activity, grilling etc - continue to do your best to avoid fragrance products Asthma control goals:  Full participation in all desired activities (may need albuterol before activity) Albuterol use two time or less a week on average (not counting use with activity) Cough interfering with sleep two time or less a month Oral steroids no more than once a year No hospitalizations   2. Allergic Rhinitis with conjunctivitis - continue allergen avoidance measures - continue Zyrtec (Cetirizine) 10 mg daily  - continue Singulair (montelukast) nightly - for nasal drainage/post-nasal drip use nasal Atrovent 0.06% 2 sprays each nostril up to 3-4 times a day  as needed - use over-the-counter Pataday Xtra Strength, Pataday 1 drop each eye daily or Zaditor or Alaway 1 drop each eye twice a day as needed for itchy/watery/red eyes.   These are all over-the-counter allergy eyedrop you can use.   Possible food allergy Continue to avoid peanuts and tree nuts. In case of an allergic reaction, give Benadryl 4 teaspoonfuls every 4 hours, and if life-threatening symptoms occur, inject with EpiPen 0.3 mg Given your negative lab work to peanuts and tree nuts and your negative skin testing today, I would recommend an in office oral challenge to pistachios.  You need to bring pistachios with you the day of your appointment.  The appointment will last approximately 3 hours and you need to be in good health the day of your appointment.  You will also need to be off all antihistamines 3 days prior to this appointment.  Keep your already scheduled follow-up appointment with Dr. Delorse Lek on December 25, 2020 at 11:20 AM Also at your convenience schedule an oral food challenge to pistachio. Return in about 8 weeks (around 12/25/2020), or if symptoms worsen or fail to improve.    Thank you for the opportunity to care for this patient.  Please do not hesitate to contact me with questions.  Nehemiah Settle, FNP [facility]

## 2020-10-27 NOTE — Telephone Encounter (Signed)
Another option might be ordering off [employer] they are around $20

## 2020-10-27 NOTE — Therapy (Addendum)
[place], [place], [address] Phone: [phone]   Fax:  [phone]  Physical Therapy Treatment / [place]  Patient Details  Name: Emily Hester MRN: [id number] Date of Birth: 16-Sep-1970 Referring Provider (PT): Mcarthur Rossetti,   Encounter Date: 10/27/2020   PT End of Session - 10/27/20 1506     Visit Number 5    Number of Visits 9    Date for PT Re-Evaluation 11/15/20    Authorization Type [employer]    PT Start Time 1503    Activity Tolerance Patient limited by pain;Patient tolerated treatment well             Past Medical History:  Diagnosis Date   Arthritis    patient denies   Asthma    Diabetes mellitus without complication ([place])    type    GERD (gastroesophageal reflux disease)    Hypertension    Interstitial cystitis    IUD (intrauterine device) in place placed 6 weeks ago   Migraines    MIgraines- not current     Pneumonia     Past Surgical History:  Procedure Laterality Date   ANTERIOR HIP REVISION Right 05/20/2020   Procedure: RIGHT TOTAL HIP REVISION- ploy exhange and hip ball exchange with bone graft;  Surgeon: Mcarthur Rossetti, MD;  Location: [facility];  Service: Orthopedics;  Laterality: Right;   DILATION AND EVACUATION  09/30/2011   Procedure: DILATATION AND EVACUATION;  Surgeon: Allena Katz, MD;  Location: [facility];  Service: Gynecology;  Laterality: N/A;   left knee surgery      arthroscopy    TOTAL HIP ARTHROPLASTY Right 03/11/2017   Procedure: RIGHT TOTAL HIP ARTHROPLASTY ANTERIOR APPROACH;  Surgeon: Mcarthur Rossetti, MD;  Location: [facility];  Service: Orthopedics;  Laterality: Right;    There were no vitals filed for this visit.                                PT Short Term Goals - 10/23/20 1041       PT SHORT TERM GOAL #1   Title Patient will be independent with initial HEP.    Baseline cues required    Time  2    Period Weeks    Status On-going    Target Date 10/27/20      PT SHORT TERM GOAL #2   Title Therapist will review FOTO and anticipated functional progress.    Baseline captured at eval.    Time 2    Period Weeks    Status Achieved    Target Date 10/27/20               PT Long Term Goals - 10/13/20 1342       PT LONG TERM GOAL #1   Title Patient will increase lumbar flexion and extension AROM by 25% without significant increase in pain to improve ability to complete bending and reaching tasks.    Baseline see flowsheet    Time 4    Period Weeks    Status New    Target Date 11/10/20      PT LONG TERM GOAL #2   Title Patient will have no referrd pain with lumbar PAIVM assessment indicative of improvements in her overall condition.    Baseline referred pain to the dorsum of the Rt foot with CPAs at L2-L5    Time 4  Period Weeks    Status New    Target Date 11/10/20      PT LONG TERM GOAL #3   Title Patient will score at least 54% function on FOTO to signify clinically meaningful improvement in functional abilities.    Baseline 39%    Time 4    Period Weeks    Status New    Target Date 11/10/20      PT LONG TERM GOAL #4   Title Patient will be independent with advanced home program to assist in management of her chronic condition.    Baseline n/a    Time 6    Period Weeks    Status New    Target Date 11/24/20                   Plan - 10/27/20 1513     Clinical Impression Statement pt arrives to her appointment reporting continued pain in the low back and LLE referred symptoms that havent changed since starting PT. She has attempted to use her heel lift in the R shoe but is wearing them with crocs which may not provide as firm of a surface to maximize the heel lift benefit, she states it is the best she can do as she cannot bend down to put shoes on.  Based on continued low back pain and referred symptoms, planned to hold off on treatment until  after she has received her injection and followed up with her referring MD for further assessment.    PT Treatment/Interventions ADLs/Self Care Home Management;Cryotherapy;Dentist;Therapeutic activities;Therapeutic exercise;Balance training;Neuromuscular re-education;Patient/family education;Manual techniques;Passive range of motion;Dry needling;Taping    PT Next Visit Plan review HEP. manual to thoracic/lumbar/hips as tolerated, TPDN prn, heel lift in R shoe??, did she f/u with Dr. Ninfa Linden regarding bladder changes    PT Home Exercise Plan Access Code [email or web address]             Patient will benefit from skilled therapeutic intervention in order to improve the following deficits and impairments:  Abnormal gait, Difficulty walking, Decreased range of motion, Decreased endurance, Decreased activity tolerance, Pain, Hypomobility, Impaired flexibility, Improper body mechanics, Decreased strength  Visit Diagnosis: Chronic bilateral low back pain with right-sided sciatica  Pain in thoracic spine  Muscle weakness (generalized)     Problem List 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[place]) 05/20/2020   Status post revision of total hip 05/20/2020   Trochanteric bursitis, right hip 09/05/2019   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   Unilateral primary osteoarthritis, right hip 01/27/2017   Starr Lake PT, DPT, LAT, ATC  10/27/20  3:20 PM      [facility] [address] [place], [place], [address] Phone: [phone]   Fax:  [phone]  Name: Emily Hester MRN: [id number] Date of Birth: 09/23/1970      PHYSICAL THERAPY DISCHARGE SUMMARY  Visits from Start of Care: 5  Current functional level related to goals / functional outcomes: See goals   Remaining deficits: Current status unknown as pt has  not returned   Education / Equipment: HEP   Patient agrees to discharge. Patient goals were not met. Patient is being discharged due to not returning since the last visit.   Juliocesar Blasius PT, DPT, LAT, ATC  11/24/20  3:44 PM

## 2020-10-27 NOTE — Telephone Encounter (Signed)
I sent in a spacer 10/06/20. Patient was told one was not sent in. I called the pharmacy and they said it was not covered by the patient's insurance. It would cost her $55.72 for a spacer. Does anyone in [place] or [place] know if there is one free spacer available?

## 2020-10-27 NOTE — Telephone Encounter (Signed)
Noted. Thank you Arvilla Meres, can you please call Iyauna and let her know about this option.

## 2020-10-28 NOTE — Telephone Encounter (Signed)
Unfortunately there was no adult spacers in [facility]. Will call patient tomorrow with update and advice.

## 2020-10-29 NOTE — Telephone Encounter (Signed)
Patient called back and is aware that there will be a spacer in [place] on Monday. She will come pick it up at that time. Thank You.

## 2020-10-29 NOTE — Telephone Encounter (Signed)
Spoke with Emily Hester and she was able to get a spacer from [facility] and it will be in the [place] office this coming Monday. Called the patient and left a voicemail asking for her to call back to inform.

## 2020-10-30 ENCOUNTER — Ambulatory Visit: Payer: Managed Care, Other (non HMO)

## 2020-10-30 NOTE — Telephone Encounter (Signed)
Spacer has been sent via Lyla Son to the [facility] office. Called to inform patient that it would be ready tomorrow 10/31/2020 instead of Monday 11/03/2020 however it stated my call could not be completed at this time please try again later.

## 2020-10-31 NOTE — Telephone Encounter (Signed)
Spoke with patient, informed her that the spacer is in our [place] office today 10/31/2020 if she would like to pick it up today instead of Monday. Patient verbalized understanding.

## 2020-11-03 ENCOUNTER — Encounter: Payer: Self-pay | Admitting: Physical Medicine and Rehabilitation

## 2020-11-03 ENCOUNTER — Ambulatory Visit: Payer: Self-pay

## 2020-11-03 ENCOUNTER — Ambulatory Visit (INDEPENDENT_AMBULATORY_CARE_PROVIDER_SITE_OTHER): Payer: Managed Care, Other (non HMO) | Admitting: Physical Medicine and Rehabilitation

## 2020-11-03 ENCOUNTER — Other Ambulatory Visit: Payer: Self-pay

## 2020-11-03 VITALS — BP 136/85 | HR 90

## 2020-11-03 DIAGNOSIS — M5416 Radiculopathy, lumbar region: Secondary | ICD-10-CM

## 2020-11-03 MED ORDER — METHYLPREDNISOLONE ACETATE 80 MG/ML IJ SUSP
80.0000 mg | Freq: Once | INTRAMUSCULAR | Status: AC
  Administered 2020-11-03: 80 mg

## 2020-11-03 NOTE — Progress Notes (Signed)
Pt state lower back pain that travels down both legs. Pt state she feels most of her pain in her left leg. Pt state walking, standing and laying down makes the pain worse. Pt state she takes pain meds to help ease her pain.  Numeric Pain Rating Scale and Functional Assessment Average Pain 7   In the last MONTH (on 0-10 scale) has pain interfered with the following?  1. General activity like being  able to carry out your everyday physical activities such as walking, climbing stairs, carrying groceries, or moving a chair?  Rating(10)   +Driver, -BT, -Dye Allergies.

## 2020-11-03 NOTE — Patient Instructions (Signed)

## 2020-11-03 NOTE — Telephone Encounter (Signed)
Patient's husband came in the office to pick up spacer.

## 2020-11-04 ENCOUNTER — Ambulatory Visit: Payer: Managed Care, Other (non HMO) | Admitting: Orthopaedic Surgery

## 2020-11-05 ENCOUNTER — Ambulatory Visit: Payer: Managed Care, Other (non HMO) | Admitting: Physical Therapy

## 2020-11-07 ENCOUNTER — Ambulatory Visit: Payer: Managed Care, Other (non HMO) | Admitting: Physical Therapy

## 2020-11-10 ENCOUNTER — Other Ambulatory Visit: Payer: Self-pay | Admitting: Orthopaedic Surgery

## 2020-11-10 ENCOUNTER — Other Ambulatory Visit: Payer: Self-pay

## 2020-11-10 ENCOUNTER — Encounter: Payer: Self-pay | Admitting: Orthopaedic Surgery

## 2020-11-10 ENCOUNTER — Ambulatory Visit (INDEPENDENT_AMBULATORY_CARE_PROVIDER_SITE_OTHER): Payer: Managed Care, Other (non HMO) | Admitting: Family

## 2020-11-10 ENCOUNTER — Encounter: Payer: Self-pay | Admitting: Family

## 2020-11-10 VITALS — BP 122/84 | HR 80 | Temp 97.8°F | Resp 16 | Ht 65.0 in | Wt 280.6 lb

## 2020-11-10 DIAGNOSIS — M7061 Trochanteric bursitis, right hip: Secondary | ICD-10-CM

## 2020-11-10 DIAGNOSIS — M4807 Spinal stenosis, lumbosacral region: Secondary | ICD-10-CM

## 2020-11-10 DIAGNOSIS — M5431 Sciatica, right side: Secondary | ICD-10-CM

## 2020-11-10 DIAGNOSIS — T7800XD Anaphylactic reaction due to unspecified food, subsequent encounter: Secondary | ICD-10-CM

## 2020-11-10 DIAGNOSIS — T7800XA Anaphylactic reaction due to unspecified food, initial encounter: Secondary | ICD-10-CM

## 2020-11-10 MED ORDER — HYDROCODONE-ACETAMINOPHEN 7.5-325 MG PO TABS: 1.0000 | ORAL_TABLET | Freq: Four times a day (QID) | ORAL | 0 refills | Status: DC | PRN

## 2020-11-10 NOTE — Progress Notes (Signed)
[address] [place] [address] Dept: [phone]  FOLLOW UP NOTE  Patient ID: Emily Hester, female    DOB: 05/20/1970  Age: 50 y.o. MRN: [id number] Date of Office Visit: 11/10/2020  Assessment  Chief Complaint: Food/Drug Challenge  HPI Emily Hester is a 50 year old female who presents today for an oral food challenge to pistachio.  She was last seen on October 27, 2020 by Nehemiah Settle, FNP for follow-up of asthma, allergic rhinitis with conjunctivitis, and possible food allergy.  Back in July she was eating pistachios and right after eating she felt like her tongue started to swell and itch.  She denied any cardiorespiratory and gastrointestinal symptoms.  She then noticed a little bit of thrush on her tongue and wondered if this was the cause of her symptoms.  She did not take any medications and reports that after a while the symptoms went away.  She had previously been able to eat peanuts and tree nuts without any problems.  She has been off all antihistamines for the past 3 days and denies any cardiorespiratory, gastrointestinal, and cutaneous symptoms.  Informed consent was signed and all questions answered.    Drug Allergies:  Allergies  Allergen Reactions   Amoxicillin Hives, Swelling and Other (See Comments)    Has patient had a PCN reaction causing immediate rash, facial/tongue/throat swelling, SOB or lightheadedness with hypotension: Yes Has patient had a PCN reaction causing severe rash involving mucus membranes or skin necrosis: No Has patient had a PCN reaction that required hospitalization: Yes Has patient had a PCN reaction occurring within the last 10 years: No  If all of the above answers are "NO", then may proceed with Cephalosporin use.    Lisinopril Itching and Cough    Heart racing    Olmesartan     jittery   Bactrim Rash    Review of Systems: Review of Systems  Constitutional:  Negative for chills and fever.  HENT:          Denies rhinorrhea, nasal congestion, and post nasal drip  Eyes:        Denies itchy watery eyes  Respiratory:  Negative for cough, shortness of breath and wheezing.   Cardiovascular:  Negative for chest pain and palpitations.  Gastrointestinal:  Negative for abdominal pain, diarrhea, nausea and vomiting.  Genitourinary:  Negative for frequency.  Skin:  Negative for itching and rash.  Neurological:  Negative for headaches.    Physical Exam: BP 122/84   Pulse 80   Temp 97.8 F (36.6 C)   Resp 16   Ht 5\' 5"  (1.651 m)   Wt 280 lb 9.6 oz (127.3 kg)   SpO2 97%   BMI 46.69 kg/m    Physical Exam Constitutional:      Appearance: Normal appearance.  HENT:     Head: Normocephalic and atraumatic.     Comments: Pharynx normal. Eyes normal. Ears normal. Nose: bilateral lower turbinates moderately edematous with no drainage noted. Left turbinate greater than right turbinate    Right Ear: Tympanic membrane, ear canal and external ear normal.     Left Ear: Tympanic membrane, ear canal and external ear normal.     Mouth/Throat:     Mouth: Mucous membranes are moist.     Pharynx: Oropharynx is clear.  Eyes:     Conjunctiva/sclera: Conjunctivae normal.  Cardiovascular:     Rate and Rhythm: Normal rate and regular rhythm.     Heart sounds: Normal heart sounds.  Pulmonary:     Effort: Pulmonary effort is normal.     Breath sounds: Normal breath sounds.     Comments: Lungs clear to auscultation Musculoskeletal:     Cervical back: Neck supple.  Skin:    General: Skin is warm.     Comments: No rashes or urticarial lesions noted  Neurological:     Mental Status: She is alert and oriented to person, place, and time.  Psychiatric:        Mood and Affect: Mood normal.        Behavior: Behavior normal.        Thought Content: Thought content normal.        Judgment: Judgment normal.    Diagnostics:  Open graded pistachio oral challenge: The patient was not able to tolerate the challenge  today without adverse signs or symptoms. Vital signs were stable throughout the challenge and observation period. She received 2 doses separated by 15 minutes, each of which was separated by vitals and a brief physical exam. She received the following doses: lip rub and rinsed pistachio lib rub.  At 9:00 she reports that her tongue began itching shortly after lip rub with salted pistachio .  Physical exam normal with no erythema and edema noted and vitals normal.At 9:12 AM she reports that the itching has subsided but can tell it was there.  9:23 AM she reports that she is no longer having any itching sensation.  At 9:37 PM she reports that she is clearing her throat and has a sensation on the inside of her right cheek.  Exam and vital signs normal.  At 9:42 AM 10 mg of Zyrtec was given.  At 10:01 AM she reports that her throat feels tight.  Exam and vitals are normal.  At 10:18 AM she reports that she no longer has the tightness in her throat.  She was monitored for 60 minutes following the last dose.   The patient had negative skin prick test and sIgE tests to pistachio  and was not able to tolerate the open graded oral challenge today without adverse signs or symptoms.    Assessment and Plan: 1. Allergy with anaphylaxis due to food     No orders of the defined types were placed in this encounter.   Patient Instructions  Avoid tree nuts and peanuts. In case of an allergic reaction, give Benadryl 4 teaspoonfuls every 4 hours, and if life-threatening symptoms occur, inject with EpiPen 0.3 mg.  Please let us know if this treatment plan is not working well for you   Keep already scheduled follow up appointment on 12/25/20 at 11:20 am.  Return in about 6 weeks (around 12/25/2020), or if symptoms worsen or fail to improve.    Thank you for the opportunity to care for this patient.  Please do not hesitate to contact me with questions.  Nehemiah Settle, FNP [facility]

## 2020-11-10 NOTE — Patient Instructions (Addendum)
Avoid tree nuts and peanuts. In case of an allergic reaction, give Benadryl 4 teaspoonfuls every 4 hours, and if life-threatening symptoms occur, inject with EpiPen 0.3 mg.  Please let us know if this treatment plan is not working well for you   Keep already scheduled follow up appointment on 12/25/20 at 11:20 am.

## 2020-11-11 ENCOUNTER — Telehealth: Payer: Self-pay | Admitting: Orthopaedic Surgery

## 2020-11-11 NOTE — Addendum Note (Signed)
Addended by: Orson Aloe on: 11/11/2020 06:05 PM   Modules accepted: Orders

## 2020-11-11 NOTE — Telephone Encounter (Signed)
Rep calling from [facility] wanting to ask Dr. Eliberto Ivory nurse a question concerning pt. States they have orders for something and she is confused about them or why she is being referred to their facility. The direct line for her is [phone].

## 2020-11-11 NOTE — Telephone Encounter (Signed)
They state they don't do a lot of medicine mgmt there; but she will check and get back to [name]

## 2020-11-12 ENCOUNTER — Telehealth: Payer: Self-pay | Admitting: Orthopaedic Surgery

## 2020-11-12 ENCOUNTER — Encounter: Payer: Self-pay | Admitting: Physician Assistant

## 2020-11-12 ENCOUNTER — Ambulatory Visit (INDEPENDENT_AMBULATORY_CARE_PROVIDER_SITE_OTHER): Payer: Managed Care, Other (non HMO)

## 2020-11-12 ENCOUNTER — Ambulatory Visit (INDEPENDENT_AMBULATORY_CARE_PROVIDER_SITE_OTHER): Payer: Managed Care, Other (non HMO) | Admitting: Physician Assistant

## 2020-11-12 DIAGNOSIS — M25552 Pain in left hip: Secondary | ICD-10-CM | POA: Diagnosis not present

## 2020-11-12 MED ORDER — METHYLPREDNISOLONE ACETATE 40 MG/ML IJ SUSP
40.0000 mg | INTRAMUSCULAR | Status: AC | PRN
  Administered 2020-11-12: 40 mg via INTRA_ARTICULAR

## 2020-11-12 MED ORDER — LIDOCAINE HCL 1 % IJ SOLN
3.0000 mL | INTRAMUSCULAR | Status: AC | PRN
  Administered 2020-11-12: 3 mL

## 2020-11-12 NOTE — Telephone Encounter (Signed)
Pt submitted 2 medical release forms, FMLA forms  for husband on behalf of wife care for his employers, Pt disability forms, and $50.00 check payment to [employer]. Accepted 11/12/20

## 2020-11-12 NOTE — Progress Notes (Addendum)
Office Visit Note   [name]           Date of Birth: May 06, 1970           MRN: [id number] Visit Date: 11/12/2020              Requested by: Merri Brunette, MD [phone] [place],  [place] [address] PCP: Merri Brunette, MD   Assessment & Plan: Visit Diagnoses:  1. Pain in left hip     Plan: Patient tolerated trochanteric injection well today.  She shown IT band stretching exercises.  She will follow-up with [name] in 2 weeks to see how she is doing overall.  Questions were encouraged and answered.  Follow-Up Instructions: Return in about 2 weeks (around 11/26/2020).   Orders:  Orders Placed This Encounter  Procedures   Large Joint Inj: L greater trochanter   XR HIP UNILAT W OR W/O PELVIS 2-3 VIEWS LEFT   Meds ordered this encounter  Medications   lidocaine (XYLOCAINE) 1 % (with pres) injection 3 mL   methylPREDNISolone acetate (DEPO-MEDROL) injection 40 mg       Procedures: Large Joint Inj: L greater trochanter on 11/12/2020 4:58 PM Indications: pain Details: 22 G 1.5 in needle, lateral approach  Arthrogram: No  Medications: 3 mL lidocaine 1 %; 40 mg methylPREDNISolone acetate 40 MG/ML Outcome: tolerated well, no immediate complications Procedure, treatment alternatives, risks and benefits explained, specific risks discussed. Consent was given by the patient. Immediately prior to procedure a time out was called to verify the correct patient, procedure, equipment, support staff and site/side marked as required. Patient was prepped and draped in the usual sterile fashion.      Clinical Data: No additional findings.   Subjective: Chief Complaint  Patient presents with   Lower Back - Follow-up, Pain   Left Hip - Pain    HPI Emily Hester is well-known to Dr. Magnus Ivan service been having some low back pain that she relates it points to the lateral aspect of her hip and buttocks region on the left.  Hip pain has been ongoing for  4 weeks.  She denies any numbness tingling down the left leg.  She did have an injection of her back with Dr. Alvester Morin recently and this has been beneficial..  History of right total hip arthroplasty which is doing well.  Review of Systems See HPI otherwise negative  Objective: Vital Signs: There were no vitals taken for this visit.  Physical Exam Constitutional:      Appearance: She is not ill-appearing or diaphoretic.  Pulmonary:     Effort: Pulmonary effort is normal.  Neurological:     Mental Status: She is alert and oriented to person, place, and time.  Psychiatric:        Mood and Affect: Mood normal.    Ortho Exam Bilateral hips good range of motion of both hips.  Tenderness over the left trochanteric region. Specialty Comments:  No specialty comments available.  Imaging: AP pelvis lateral view left hip: Bilateral hips well located.  Status post right total hip arthroplasty without any complicating features.  Left hip joint well-maintained.  No acute fractures bony abnormalities throughout the pelvis and both hips.   PMFS History: 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HCC) 05/20/2020   Status post revision of total hip 05/20/2020   Trochanteric bursitis, right hip 09/05/2019   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/11/2017   Unilateral primary osteoarthritis, right hip 01/27/2017   Past Medical History:  Diagnosis Date   Arthritis    patient denies   Asthma    Diabetes mellitus without complication (HCC)    type    GERD (gastroesophageal reflux disease)    Hypertension    Interstitial cystitis    IUD (intrauterine device) in place placed 6 weeks ago   Migraines    MIgraines- not current     Pneumonia     Family History  Problem Relation Age of Onset   Diabetes Mother    Heart disease Mother    Diabetes Father    Eczema Sister    Asthma Brother    Asthma Son    Eczema Daughter    Allergic  rhinitis Neg Hx    Angioedema Neg Hx    Immunodeficiency Neg Hx    Urticaria Neg Hx     Past Surgical History:  Procedure Laterality Date   ANTERIOR HIP REVISION Right 05/20/2020   Procedure: RIGHT TOTAL HIP REVISION- ploy exhange and hip ball exchange with bone graft;  Surgeon: Kathryne Hitch, MD;  Location: [facility];  Service: Orthopedics;  Laterality: Right;   DILATION AND EVACUATION  09/30/2011   Procedure: DILATATION AND EVACUATION;  Surgeon: Harleyquinn Andrea, MD;  Location: [facility];  Service: Gynecology;  Laterality: N/A;   left knee surgery      arthroscopy    TOTAL HIP ARTHROPLASTY Right 03/11/2017   Procedure: RIGHT TOTAL HIP ARTHROPLASTY ANTERIOR APPROACH;  Surgeon: Kathryne Hitch, MD;  Location: [facility];  Service: Orthopedics;  Laterality: Right;   Social History   Occupational History   Not on file  Tobacco Use   Smoking status: Never   Smokeless tobacco: Never  Vaping Use   Vaping Use: Never used  Substance and Sexual Activity   Alcohol use: No   Drug use: No   Sexual activity: Not on file

## 2020-11-19 NOTE — Progress Notes (Signed)
Emily Hester - 50 y.o. female MRN [id number]  Date of birth: 01-Apr-1970  Office Visit Note: Visit Date: 11/03/2020 PCP: Merri Brunette, MD Referred by: Merri Brunette, MD  Subjective: Chief Complaint  Patient presents with   Lower Back - Pain   Left Leg - Pain   Right Leg - Pain   HPI:  Emily Hester is a 50 y.o. female who comes in today for planned repeat Right S1-2  Lumbar Transforaminal epidural steroid injection with fluoroscopic guidance.  The patient has failed conservative care including home exercise, medications, time and activity modification.  This injection will be diagnostic and hopefully therapeutic.  Please see requesting physician notes for further details and justification. Patient received more than 50% pain relief from prior injection.  However, now she complains of bilateral low back and bilateral hip and leg pain posteriorly.  Very mild findings on MRI in 2021.  Prior injection did help on the right.  She is status post right total hip replacement with revision.  Her course is complicated by history of interstitial cystitis and migraine headache and chronic pain syndrome.  Depending on relief patient probably should regroup with physical therapy and core strengthening and possibly updated MRI of this just seems to worsen.  Again findings on her MRI for her lumbar spine is pretty mild.  Continues to take hydrocodone daily.   Referring: Dr. [name]   ROS Otherwise per HPI.  Assessment & Plan: Visit Diagnoses:    ICD-10-CM   1. Lumbar radiculopathy  M54.16 XR C-ARM NO REPORT    Epidural Steroid injection    methylPREDNISolone acetate (DEPO-MEDROL) injection 80 mg      Plan: No additional findings.   Meds & Orders:  Meds ordered this encounter  Medications   methylPREDNISolone acetate (DEPO-MEDROL) injection 80 mg    Orders Placed This Encounter  Procedures   XR C-ARM NO REPORT   Epidural Steroid injection    Follow-up: Return if  symptoms worsen or fail to improve.   Procedures: No procedures performed  S1 Lumbosacral Transforaminal Epidural Steroid Injection - Sub-Pedicular Approach with Fluoroscopic Guidance   [name]      Date of Birth: 04/30/1970 MRN: [id number] PCP: Merri Brunette, MD      Visit Date: 11/03/2020   Universal Protocol:    Date/Time: 09/07/225:43 AM  Consent Given By: the patient  Position:  PRONE  Additional Comments: Vital signs were monitored before and after the procedure. Patient was prepped and draped in the usual sterile fashion. The correct patient, procedure, and site was verified.   Injection Procedure Details:  Procedure Site One Meds Administered:  Meds ordered this encounter  Medications   methylPREDNISolone acetate (DEPO-MEDROL) injection 80 mg    Laterality: Bilateral  Location/Site:  S1 Foramen   Needle size: 22 ga.  Needle type: Spinal  Needle Placement: Transforaminal  Findings:   -Comments: Excellent flow of contrast along the nerve, nerve root and into the epidural space.  Epidurogram: Contrast epidurogram showed no nerve root cut off or restricted flow pattern.  Procedure Details: After squaring off the sacral end-plate to get a true AP view, the C-arm was positioned so that the best possible view of the S1 foramen was visualized. The soft tissues overlying this structure were infiltrated with 2-3 ml. of 1% Lidocaine without Epinephrine.    The spinal needle was inserted toward the target using a "trajectory" view along the fluoroscope beam.  Under AP and lateral visualization, the needle  was advanced so it did not puncture dura. Biplanar projections were used to confirm position. Aspiration was confirmed to be negative for CSF and/or blood. A 1-2 ml. volume of Isovue-250 was injected and flow of contrast was noted at each level. Radiographs were obtained for documentation purposes.   After attaining the desired flow of contrast  documented above, a 0.5 to 1.0 ml test dose of 0.25% Marcaine was injected into each respective transforaminal space.  The patient was observed for 90 seconds post injection.  After no sensory deficits were reported, and normal lower extremity motor function was noted,   the above injectate was administered so that equal amounts of the injectate were placed at each foramen (level) into the transforaminal epidural space.   Additional Comments:  The patient tolerated the procedure well Dressing: Band-Aid with 2 x 2 sterile gauze    Post-procedure details: Patient was observed during the procedure. Post-procedure instructions were reviewed.  Patient left the clinic in stable condition.   Clinical History: [facility] LUMBAR SPINE WITHOUT CONTRAST   TECHNIQUE: Multiplanar, multisequence MR imaging of the lumbar spine was performed. No intravenous contrast was administered.   COMPARISON:  08/24/2018   FINDINGS: Segmentation:  Standard.   Alignment:  Physiologic.   Vertebrae: No fracture, evidence of discitis, or bone lesion. Mild spurring superior to the sacroiliac joints.   Conus medullaris and cauda equina: Conus extends to the L1 level. Conus and cauda equina appear normal.   Paraspinal and other soft tissues: Negative   Disc levels:   L1-2 mild disc narrowing and desiccation with bulge.   L5-S1 annular fissure without herniation or impingement.   No definite or significant facet spurring.   IMPRESSION: 1. Stable compared to 2020. 2. Overall mild degenerative changes at the L1-2 and L5-S1 disc spaces. No neural compression.     Electronically Signed   By: Marnee Spring M.D.   On: 02/13/2020 07:11     Objective:  VS:  HT:    WT:   BMI:     BP:136/85  HR:90bpm  TEMP: ( )  RESP:  Physical Exam Vitals and nursing note reviewed.  Constitutional:      General: She is not in acute distress.    Appearance: Normal appearance. She is obese. She is not ill-appearing.   HENT:     Head: Normocephalic and atraumatic.     Right Ear: External ear normal.     Left Ear: External ear normal.  Eyes:     Extraocular Movements: Extraocular movements intact.  Cardiovascular:     Rate and Rhythm: Normal rate.     Pulses: Normal pulses.  Pulmonary:     Effort: Pulmonary effort is normal. No respiratory distress.  Abdominal:     General: There is no distension.     Palpations: Abdomen is soft.  Musculoskeletal:        General: Tenderness present.     Cervical back: Neck supple.     Right lower leg: No edema.     Left lower leg: No edema.     Comments: Patient has good distal strength with  pain over the greater trochanters.  No clonus or focal weakness.  Some back pain with extension but also forward flexion.  Trigger points unable to be palpated.  Skin:    Findings: No erythema, lesion or rash.  Neurological:     General: No focal deficit present.     Mental Status: She is alert and oriented to person, place, and time.  Sensory: No sensory deficit.     Motor: No weakness or abnormal muscle tone.     Coordination: Coordination normal.  Psychiatric:        Mood and Affect: Mood normal.        Behavior: Behavior normal.     Imaging: No results found.

## 2020-11-19 NOTE — Procedures (Signed)
S1 Lumbosacral Transforaminal Epidural Steroid Injection - Sub-Pedicular Approach with Fluoroscopic Guidance   [name]      Date of Birth: Jun 27, 1970 MRN: [id number] PCP: Merri Brunette, MD      Visit Date: 11/03/2020   Universal Protocol:    Date/Time: 09/07/225:43 AM  Consent Given By: the patient  Position:  PRONE  Additional Comments: Vital signs were monitored before and after the procedure. Patient was prepped and draped in the usual sterile fashion. The correct patient, procedure, and site was verified.   Injection Procedure Details:  Procedure Site One Meds Administered:  Meds ordered this encounter  Medications   methylPREDNISolone acetate (DEPO-MEDROL) injection 80 mg    Laterality: Bilateral  Location/Site:  S1 Foramen   Needle size: 22 ga.  Needle type: Spinal  Needle Placement: Transforaminal  Findings:   -Comments: Excellent flow of contrast along the nerve, nerve root and into the epidural space.  Epidurogram: Contrast epidurogram showed no nerve root cut off or restricted flow pattern.  Procedure Details: After squaring off the sacral end-plate to get a true AP view, the C-arm was positioned so that the best possible view of the S1 foramen was visualized. The soft tissues overlying this structure were infiltrated with 2-3 ml. of 1% Lidocaine without Epinephrine.    The spinal needle was inserted toward the target using a "trajectory" view along the fluoroscope beam.  Under AP and lateral visualization, the needle was advanced so it did not puncture dura. Biplanar projections were used to confirm position. Aspiration was confirmed to be negative for CSF and/or blood. A 1-2 ml. volume of Isovue-250 was injected and flow of contrast was noted at each level. Radiographs were obtained for documentation purposes.   After attaining the desired flow of contrast documented above, a 0.5 to 1.0 ml test dose of 0.25% Marcaine was injected  into each respective transforaminal space.  The patient was observed for 90 seconds post injection.  After no sensory deficits were reported, and normal lower extremity motor function was noted,   the above injectate was administered so that equal amounts of the injectate were placed at each foramen (level) into the transforaminal epidural space.   Additional Comments:  The patient tolerated the procedure well Dressing: Band-Aid with 2 x 2 sterile gauze    Post-procedure details: Patient was observed during the procedure. Post-procedure instructions were reviewed.  Patient left the clinic in stable condition.

## 2020-11-27 ENCOUNTER — Ambulatory Visit: Payer: Managed Care, Other (non HMO) | Admitting: Physician Assistant

## 2020-12-02 ENCOUNTER — Encounter: Payer: Self-pay | Admitting: Orthopaedic Surgery

## 2020-12-10 ENCOUNTER — Ambulatory Visit (INDEPENDENT_AMBULATORY_CARE_PROVIDER_SITE_OTHER): Payer: Managed Care, Other (non HMO) | Admitting: Physician Assistant

## 2020-12-10 ENCOUNTER — Encounter: Payer: Self-pay | Admitting: Physician Assistant

## 2020-12-10 DIAGNOSIS — M5442 Lumbago with sciatica, left side: Secondary | ICD-10-CM | POA: Diagnosis not present

## 2020-12-10 DIAGNOSIS — G8929 Other chronic pain: Secondary | ICD-10-CM | POA: Diagnosis not present

## 2020-12-10 MED ORDER — HYDROCODONE-ACETAMINOPHEN 7.5-325 MG PO TABS: 1.0000 | ORAL_TABLET | Freq: Four times a day (QID) | ORAL | 0 refills | Status: DC | PRN

## 2020-12-10 NOTE — Progress Notes (Signed)
HPI: Ms. Demos returns today due to left hip pain.  She states that the trochanteric injection did not really help and gave her minimal relief.  She does feel the injection she has received with Dr. Alvester Morin her back to help.  She had an MRI of her lumbar spine in November 2021 which showed some mild degenerative changes.  Also L5-S1 annular fissure without herniation or impingement.  She states since the MRI she has developed left leg pain that radiates from the buttocks region down to the knee.  She does note the numbness tingling in her right leg has definitely improved with the epidural steroid injection she is had.  She states her back pain is 8 out of 10 and unchanged however.  She was referred to pain management however she was denied referral by pain management due to the fact that she was already receiving injections with Dr. Alvester Morin and they were not willing to do is provide her with pain medication.  Physical exam: General well-developed well-nourished female no acute distress. Psych: Alert and oriented x3. Left hip tenderness over the trochanteric region. Impression: Chronic low back pain Bilateral hip pain  Plan: We will refill her hydrocodone today.  We will try to gain approval for an MRI to rule out HNP as a source of her left leg pain as this is new and radiates to the knee.  She has failed conservative treatment with trochanteric injections and stretching for trochanteric bursitis.  Radiographs at last office visit showed the left hip joint to be well-maintained.  No acute fractures or acute findings on AP pelvis and lateral view of the left hip.  She will follow-up with Dr. Magnus Ivan after the MRI to go over the results and discuss further treatment.

## 2020-12-11 NOTE — Addendum Note (Signed)
Addended by: Barbette Or on: 12/11/2020 08:22 AM   Modules accepted: Orders

## 2020-12-25 ENCOUNTER — Ambulatory Visit (INDEPENDENT_AMBULATORY_CARE_PROVIDER_SITE_OTHER): Payer: Managed Care, Other (non HMO) | Admitting: Allergy

## 2020-12-25 ENCOUNTER — Encounter: Payer: Self-pay | Admitting: Allergy

## 2020-12-25 ENCOUNTER — Other Ambulatory Visit: Payer: Self-pay

## 2020-12-25 VITALS — BP 132/80 | HR 80 | Temp 98.1°F | Resp 16 | Ht 65.0 in | Wt 280.0 lb

## 2020-12-25 DIAGNOSIS — J4520 Mild intermittent asthma, uncomplicated: Secondary | ICD-10-CM

## 2020-12-25 DIAGNOSIS — J3089 Other allergic rhinitis: Secondary | ICD-10-CM | POA: Diagnosis not present

## 2020-12-25 DIAGNOSIS — T7800XA Anaphylactic reaction due to unspecified food, initial encounter: Secondary | ICD-10-CM

## 2020-12-25 DIAGNOSIS — H1013 Acute atopic conjunctivitis, bilateral: Secondary | ICD-10-CM | POA: Diagnosis not present

## 2020-12-25 DIAGNOSIS — T7800XD Anaphylactic reaction due to unspecified food, subsequent encounter: Secondary | ICD-10-CM

## 2020-12-25 MED ORDER — ALBUTEROL SULFATE HFA 108 (90 BASE) MCG/ACT IN AERS: 2.0000 | INHALATION_SPRAY | RESPIRATORY_TRACT | 1 refills | Status: DC | PRN

## 2020-12-25 MED ORDER — MONTELUKAST SODIUM 10 MG PO TABS: 10.0000 mg | ORAL_TABLET | Freq: Every day | ORAL | 1 refills | Status: DC

## 2020-12-25 MED ORDER — EPINEPHRINE 0.3 MG/0.3ML IJ SOAJ: 0.3000 mg | Freq: Once | INTRAMUSCULAR | 2 refills | Status: AC

## 2020-12-25 MED ORDER — IPRATROPIUM BROMIDE 0.06 % NA SOLN: NASAL | 1 refills | Status: DC

## 2020-12-25 MED ORDER — BUDESONIDE-FORMOTEROL FUMARATE 160-4.5 MCG/ACT IN AERO: 2.0000 | INHALATION_SPRAY | Freq: Two times a day (BID) | RESPIRATORY_TRACT | 5 refills | Status: DC

## 2020-12-25 NOTE — Progress Notes (Signed)
Follow-up Note  RE: Emily Hester MRN: [id number] DOB: 11-07-1970 Date of Office Visit: 12/25/2020   History of present illness: Emily Hester is a 50 y.o. female presenting today for follow-up of asthma, environmental allergies and food allergy.  She was last seen in the office on 11/10/2020 by our nurse practitioner Amada Jupiter.  At that visit she performed a food challenge to pistachio which she did not pass that she developed lip tingling, tongue itching, throat clearing, throat tightness and a sensation on the inside of her right cheek.  The exam reported to be normal.  She was treated with Zyrtec which did resolve the symptoms.  She has continued to avoid all nuts since.   She feels like her breathing has been doing well.  She did use albuterol yesterday as she was around a lot of fragrance products that she could not seem to stay away from.   She is taking her Symbicort 160 mg 2 puffs twice a day as well as Singulair daily.  She reports nighttime awakenings several times a month.  She has not had any ED or urgent care visits with systemic steroid needs since her last visit.  With her allergies she states she definitely can tell a difference when she has not taken her Zyrtec.  She does have more nasal symptoms.  Thus she tries to take her Zyrtec daily.  Currently she is using atrovent as needed for nasal drainage.  However she has been doing more throat clearing.  She does have questions today in regards to the over-the-counter eyedrops she can provide.  Review of systems: Review of Systems  Constitutional: Negative.   HENT:         Throat clearing   Eyes: Negative.   Respiratory: Negative.    Cardiovascular: Negative.   Gastrointestinal: Negative.   Musculoskeletal: Negative.   Skin: Negative.   Neurological: Negative.    All other systems negative unless noted above in HPI  Past medical/social/surgical/family history have been reviewed and are unchanged unless  specifically indicated below.  No changes  Medication List: Current Outpatient Medications  Medication Sig Dispense Refill   acetaminophen (TYLENOL) 500 MG tablet Take 1,000 mg by mouth every 6 (six) hours as needed for moderate pain.     albuterol (PROVENTIL) (2.5 MG/3ML) 0.083% nebulizer solution Take 3 mLs (2.5 mg total) by nebulization every 4 (four) hours as needed for wheezing or shortness of breath. 75 mL 1   albuterol (VENTOLIN HFA) 108 (90 Base) MCG/ACT inhaler Inhale 2 puffs into the lungs every 4 (four) hours as needed for wheezing or shortness of breath. 18 g 1   amLODipine (NORVASC) 10 MG tablet Take 10 mg by mouth every morning.     aspirin-acetaminophen-caffeine (EXCEDRIN MIGRAINE) 250-250-65 MG tablet Take 2 tablets by mouth every 6 (six) hours as needed for headache.     baclofen (LIORESAL) 10 MG tablet Take 1 tablet (10 mg total) by mouth 3 (three) times daily as needed for muscle spasms. 60 each 1   budesonide-formoterol (SYMBICORT) 160-4.5 MCG/ACT inhaler Inhale 2 puffs into the lungs 2 (two) times daily. 1 each 5   cetirizine (ZYRTEC) 10 MG tablet Take 1 tablet (10 mg total) by mouth daily. 90 tablet 1   Cholecalciferol (VITAMIN D) 50 MCG (2000 UT) tablet Take 2,000 Units by mouth daily.     clindamycin (CLEOCIN) 150 MG capsule Take 600 mg by mouth See admin instructions. Take 600 mg 1 hour prior to dental work  diphenhydrAMINE (BENADRYL) 25 MG tablet Take 25 mg by mouth daily as needed for allergies.     Dulaglutide (TRULICITY) 1.5 MG/0.5ML SOPN Inject 1.5 mg into the skin every Tuesday.     gabapentin (NEURONTIN) 100 MG capsule Take 1 capsule (100 mg total) by mouth at bedtime. 30 capsule 0   HYDROcodone-acetaminophen (NORCO) 7.5-325 MG tablet Take 1 tablet by mouth every 6 (six) hours as needed for moderate pain. 30 tablet 0   ibuprofen (ADVIL) 200 MG tablet Take 600 mg by mouth every 6 (six) hours as needed for headache or moderate pain.     ipratropium (ATROVENT)  0.06 % nasal spray Apply 2 sprays each nostril up to 3-4 times a day as needed 45 mL 1   levonorgestrel (MIRENA) 20 MCG/24HR IUD 1 each by Intrauterine route once.     lidocaine (LIDODERM) 5 % Place 1 patch onto the skin daily as needed (pain). Remove & Discard patch within 12 hours or as directed by MD 30 patch 3   losartan (COZAAR) 25 MG tablet Take 25 mg by mouth daily.  0   metFORMIN (GLUCOPHAGE-XR) 750 MG 24 hr tablet Take 750 mg by mouth 2 (two) times daily.     montelukast (SINGULAIR) 10 MG tablet Take 1 tablet (10 mg total) by mouth at bedtime. 90 tablet 1   omeprazole (PRILOSEC) 20 MG capsule Take 1 capsule (20 mg total) by mouth every morning. (Patient taking differently: Take 20 mg by mouth daily.) 30 capsule 5   ondansetron (ZOFRAN ODT) 4 MG disintegrating tablet Take 1 tablet (4 mg total) by mouth every 8 (eight) hours as needed for nausea or vomiting. 20 tablet 1   ondansetron (ZOFRAN) 8 MG tablet Take 8 mg by mouth every 8 (eight) hours as needed for nausea or vomiting.     ONETOUCH VERIO test strip as directed.   3   rosuvastatin (CRESTOR) 10 MG tablet Take 10 mg by mouth daily.  3   Spacer/Aero-Holding Chambers DEVI Take 1 each by mouth as directed. 1 each 0   tiZANidine (ZANAFLEX) 4 MG tablet TAKE 1 TABLET BY MOUTH EVERY 6 HOURS AS NEEDED FOR MUSCLE SPASMS. 60 tablet 0   No current facility-administered medications for this visit.     Known medication allergies: Allergies  Allergen Reactions   Amoxicillin Hives, Swelling and Other (See Comments)    Has patient had a PCN reaction causing immediate rash, facial/tongue/throat swelling, SOB or lightheadedness with hypotension: Yes Has patient had a PCN reaction causing severe rash involving mucus membranes or skin necrosis: No Has patient had a PCN reaction that required hospitalization: Yes Has patient had a PCN reaction occurring within the last 10 years: No  If all of the above answers are "NO", then may proceed with  Cephalosporin use.    Lisinopril Itching and Cough    Heart racing    Olmesartan     jittery   Bactrim Rash     Physical examination: Blood pressure 132/80, pulse 80, temperature 98.1 F (36.7 C), resp. rate 16, height 5\' 5"  (1.651 m), weight 280 lb (127 kg), SpO2 96 %.  General: Alert, interactive, in no acute distress. HEENT: PERRLA, TMs pearly gray, turbinates non-edematous with clear discharge, post-pharynx non erythematous. Neck: Supple without lymphadenopathy. Lungs: Clear to auscultation without wheezing, rhonchi or rales. {no increased work of breathing. CV: Normal S1, S2 without murmurs. Abdomen: Nondistended, nontender. Skin: Warm and dry, without lesions or rashes. Extremities:  No clubbing, cyanosis or edema.  Neuro:   Grossly intact.  Diagnositics/Labs:  Spirometry: FEV1: 1.84 L 78%, FVC: 2.27 L 77% predicted.  This is an improved study over her previous spirometry.  Assessment and plan:   Asthma -Continue Symbicort 2 puffs twice a day with spacer to help prevent cough and wheeze.   - Continue Singulair (montelukast) 10mg  nightly - have access to albuterol inhaler 2 puffs every 4-6 hours as needed for cough/wheeze/shortness of breath/chest tightness.  May use 15-20 minutes prior to activity.   Monitor frequency of use.  - recommend use of your albuterol prior to outdoor activity, grilling etc - continue to do your best to avoid fragrance products Asthma control goals:  Full participation in all desired activities (may need albuterol before activity) Albuterol use two time or less a week on average (not counting use with activity) Cough interfering with sleep two time or less a month Oral steroids no more than once a year No hospitalizations  Allergic Rhinitis with conjunctivitis - continue allergen avoidance measures - continue Zyrtec (Cetirizine) 10 mg daily  - continue Singulair (montelukast) nightly - for nasal drainage/post-nasal drip use nasal  Atrovent 0.06% 2 sprays each nostril up to 3-4 times a day as needed - use over-the-counter Pataday Xtra Strength, Pataday 1 drop each eye daily or Zaditor or Alaway 1 drop each eye twice a day as needed for itchy/watery/red eyes.   These are all over-the-counter allergy eyedrop you can use.   Food allergy Continue to avoid peanuts and tree nuts after not passing tree nut challenge. In case of an allergic reaction, give Benadryl 4 teaspoonfuls every 4 hours, and if life-threatening symptoms occur, inject with EpiPen 0.3 mg  Follow-up in 6 months or sooner if needed  I appreciate the opportunity to take part in Emily Hester's care. Please do not hesitate to contact me with questions.  Sincerely,   , MD Allergy/Immunology [facility]

## 2020-12-25 NOTE — Patient Instructions (Addendum)
Asthma -Continue Symbicort 160mcg 2 puffs twice a day with spacer to help prevent cough and wheeze.   - Continue Singulair (montelukast) 10mg nightly - have access to albuterol inhaler 2 puffs every 4-6 hours as needed for cough/wheeze/shortness of breath/chest tightness.  May use 15-20 minutes prior to activity.   Monitor frequency of use.  - recommend use of your albuterol prior to outdoor activity, grilling etc - continue to do your best to avoid fragrance products Asthma control goals:  Full participation in all desired activities (may need albuterol before activity) Albuterol use two time or less a week on average (not counting use with activity) Cough interfering with sleep two time or less a month Oral steroids no more than once a year No hospitalizations  Allergic Rhinitis with conjunctivitis - continue allergen avoidance measures - continue Zyrtec (Cetirizine) 10 mg daily  - continue Singulair (montelukast) nightly - for nasal drainage/post-nasal drip use nasal Atrovent 0.06% 2 sprays each nostril up to 3-4 times a day as needed - use over-the-counter Pataday Xtra Strength, Pataday 1 drop each eye daily or Zaditor or Alaway 1 drop each eye twice a day as needed for itchy/watery/red eyes.   These are all over-the-counter allergy eyedrop you can use.   Food allergy Continue to avoid peanuts and tree nuts after not passing tree nut challenge. In case of an allergic reaction, give Benadryl 4 teaspoonfuls every 4 hours, and if life-threatening symptoms occur, inject with EpiPen 0.3 mg  Follow-up in 6 months or sooner if needed 

## 2020-12-26 ENCOUNTER — Other Ambulatory Visit: Payer: Self-pay | Admitting: *Deleted

## 2020-12-26 MED ORDER — ALBUTEROL SULFATE HFA 108 (90 BASE) MCG/ACT IN AERS: 2.0000 | INHALATION_SPRAY | Freq: Four times a day (QID) | RESPIRATORY_TRACT | 1 refills | Status: DC | PRN

## 2020-12-27 ENCOUNTER — Ambulatory Visit
Admission: RE | Admit: 2020-12-27 | Discharge: 2020-12-27 | Disposition: A | Discharge status: Home / Self Care | Payer: Managed Care, Other (non HMO) | Source: Ambulatory Visit | Attending: Physician Assistant | Admitting: Physician Assistant

## 2020-12-27 ENCOUNTER — Other Ambulatory Visit: Payer: Self-pay

## 2020-12-27 DIAGNOSIS — G8929 Other chronic pain: Secondary | ICD-10-CM

## 2020-12-30 ENCOUNTER — Other Ambulatory Visit: Payer: Managed Care, Other (non HMO)

## 2021-01-06 ENCOUNTER — Ambulatory Visit (INDEPENDENT_AMBULATORY_CARE_PROVIDER_SITE_OTHER): Payer: Managed Care, Other (non HMO) | Admitting: Orthopaedic Surgery

## 2021-01-06 ENCOUNTER — Encounter: Payer: Self-pay | Admitting: Orthopaedic Surgery

## 2021-01-06 DIAGNOSIS — M5431 Sciatica, right side: Secondary | ICD-10-CM

## 2021-01-06 DIAGNOSIS — M4807 Spinal stenosis, lumbosacral region: Secondary | ICD-10-CM

## 2021-01-06 DIAGNOSIS — G8929 Other chronic pain: Secondary | ICD-10-CM

## 2021-01-06 DIAGNOSIS — Z96649 Presence of unspecified artificial hip joint: Secondary | ICD-10-CM

## 2021-01-06 DIAGNOSIS — M5442 Lumbago with sciatica, left side: Secondary | ICD-10-CM | POA: Diagnosis not present

## 2021-01-06 DIAGNOSIS — M25552 Pain in left hip: Secondary | ICD-10-CM

## 2021-01-06 MED ORDER — LIDOCAINE 5 % EX PTCH: 1.0000 | MEDICATED_PATCH | Freq: Every day | CUTANEOUS | 3 refills | Status: DC | PRN

## 2021-01-06 NOTE — Progress Notes (Signed)
The patient comes in today to go over MRI of her lumbar spine.  She has chronic low back pain and has been having worsening left-sided hip pain and sciatica.  X-rays of her left hip were normal with a well-maintained joint space.  She says that the radicular symptoms that were going down to her foot have subsided.  She is on chronic pain medications and has anti-inflammatory and lidocaine patches believe as well.  I have performed a right total hip arthroplasty.  There is radiographic evidence suggesting that the femoral component is loose.  I took her back to the operating room and did not find any loosening of the femoral component at all.  I was able to actually increase her leg length with the knee polyliner exchange and a longer hip ball.  She says that therapy says her leg lengths are off.  However I later in supine position and her leg lengths are entirely equal and I confirmed this radiographically as well.  She has had a bursectomy on her right hip.  That has not helped her 1 better at all either.  She is point tender over the trochanteric area of both hips today.  Her left native hip moves normally and there is no pain in the groin at all.  Her right operative hip moves smoothly as well.  Again there is no leg length difference at all and her leg lengths are completely equal and we have her lay her supine.  The MRI of her lumbar spine shows no significant stenosis.  There is some mild degenerative changes but no significant protrusions or pathology that would be causing radicular symptoms.  She did request steroid injections of both trochanteric areas and agreed to this and she tolerated it well.  I would not recommend a bursectomy on her left side given the fact that her right side did not help at all and she is so sensitive to barely touching that area that I am afraid that this is more of a pain syndrome that no surgery is going to help.  She did tolerate the injections well.  I would like to see  her back in 3 months to see how she is doing overall.

## 2021-01-15 ENCOUNTER — Other Ambulatory Visit: Payer: Self-pay

## 2021-01-16 MED ORDER — HYDROCODONE-ACETAMINOPHEN 7.5-325 MG PO TABS: 1.0000 | ORAL_TABLET | Freq: Four times a day (QID) | ORAL | 0 refills | Status: DC | PRN

## 2021-01-16 NOTE — Telephone Encounter (Signed)
Please advise 

## 2021-02-12 ENCOUNTER — Other Ambulatory Visit: Payer: Self-pay

## 2021-02-12 ENCOUNTER — Ambulatory Visit
Admission: EM | Admit: 2021-02-12 | Discharge: 2021-02-12 | Disposition: A | Discharge status: Home / Self Care | Payer: Managed Care, Other (non HMO) | Attending: Physician Assistant | Admitting: Physician Assistant

## 2021-02-12 DIAGNOSIS — J069 Acute upper respiratory infection, unspecified: Secondary | ICD-10-CM | POA: Diagnosis not present

## 2021-02-12 NOTE — ED Triage Notes (Addendum)
Pt c/o cough, chest/nasal congestion, fatigue, and sore throat x 5 days. States cough is worse at night. States has had 2 neg covid test.

## 2021-02-13 NOTE — ED Provider Notes (Signed)
[facility]    CSN: [id number] Arrival date & time: 02/12/21  1907      History   Chief Complaint Chief Complaint  Patient presents with   Cough    HPI Emily Hester is a 50 y.o. female.   Patient here today for evaluation of cough, congestion that she has had for the last 5 days.  She reports that she has also had fatigue and sore throat.  She states that cough is worse at nighttime.  She denies any vomiting or diarrhea.  She has tried over-the-counter medication without significant relief.  The history is provided by the patient.  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   Status post revision of total hip 05/20/2020   Trochanteric bursitis, right hip 09/05/2019   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   Unilateral primary osteoarthritis, right hip 01/27/2017    Past Surgical History:  Procedure Laterality Date   ANTERIOR HIP REVISION Right 05/20/2020   Procedure: RIGHT TOTAL HIP REVISION- ploy exhange and hip ball exchange with bone graft;  Surgeon: Kathryne Hitch, MD;  Location: [facility];  Service: Orthopedics;  Laterality: Right;   DILATION AND EVACUATION  09/30/2011   Procedure: DILATATION AND EVACUATION;  Surgeon: Yocelin Andrea, MD;  Location: [facility];  Service: Gynecology;  Laterality: N/A;   left knee surgery      arthroscopy    TOTAL HIP ARTHROPLASTY Right 03/11/2017    Procedure: RIGHT TOTAL HIP ARTHROPLASTY ANTERIOR APPROACH;  Surgeon: Kathryne Hitch, MD;  Location: [facility];  Service: Orthopedics;  Laterality: Right;    OB History     Gravida  1   Para      Term      Preterm      AB      Living         SAB      IAB      Ectopic      Multiple      Live Births               Home Medications    Prior to Admission medications   Medication Sig Start Date End Date Taking? Authorizing Provider  acetaminophen (TYLENOL) 500 MG tablet Take 1,000 mg by mouth every 6 (six) hours as needed for moderate pain.    [provider]  albuterol (PROVENTIL) (2.5 MG/3ML) 0.083% nebulizer solution Take 3 mLs (2.5 mg total) by nebulization every 4 (four) hours as needed for wheezing or shortness of breath. 08/29/20   Marcelyn Bruins, MD  albuterol (VENTOLIN HFA) 108 (90 Base) MCG/ACT inhaler Inhale 2 puffs into the lungs every 4 (four) hours as needed for wheezing or shortness of breath. 12/25/20   Marcelyn Bruins, MD  albuterol (VENTOLIN HFA) 108 (90 Base)  MCG/ACT inhaler Inhale 2 puffs into the lungs every 6 (six) hours as needed for wheezing or shortness of breath. 12/26/20   Marcelyn Bruins, MD  amLODipine (NORVASC) 10 MG tablet Take 10 mg by mouth every morning.    [provider]  aspirin-acetaminophen-caffeine (EXCEDRIN MIGRAINE) [phone] MG tablet Take 2 tablets by mouth every 6 (six) hours as needed for headache.    [provider]  baclofen (LIORESAL) 10 MG tablet Take 1 tablet (10 mg total) by mouth 3 (three) times daily as needed for muscle spasms. 10/06/20   Kathryne Hitch, MD  budesonide-formoterol [facility]) 160-4.5 MCG/ACT inhaler Inhale 2 puffs into the lungs 2 (two) times daily. 12/25/20   Marcelyn Bruins, MD  cetirizine (ZYRTEC) 10 MG tablet Take 1 tablet (10 mg total) by mouth daily. 09/04/20   Marcelyn Bruins, MD  Cholecalciferol (VITAMIN D)  50 MCG (2000 UT) tablet Take 2,000 Units by mouth daily.    [provider]  clindamycin (CLEOCIN) 150 MG capsule Take 600 mg by mouth See admin instructions. Take 600 mg 1 hour prior to dental work 10/10/19   [provider]  diphenhydrAMINE (BENADRYL) 25 MG tablet Take 25 mg by mouth daily as needed for allergies.    [provider]  Dulaglutide (TRULICITY) 1.5 MG/0.5ML SOPN Inject 1.5 mg into the skin every Tuesday.    [provider]  gabapentin (NEURONTIN) 100 MG capsule Take 1 capsule (100 mg total) by mouth at bedtime. 10/16/20   Kirtland Bouchard, PA-C  HYDROcodone-acetaminophen (NORCO) 7.5-325 MG tablet Take 1 tablet by mouth every 6 (six) hours as needed for moderate pain. 01/16/21   Kirtland Bouchard, PA-C  ibuprofen (ADVIL) 200 MG tablet Take 600 mg by mouth every 6 (six) hours as needed for headache or moderate pain.    [provider]  ipratropium (ATROVENT) 0.06 % nasal spray Apply 2 sprays each nostril up to 3-4 times a day as needed 12/25/20   Marcelyn Bruins, MD  levonorgestrel (MIRENA) 20 MCG/24HR IUD 1 each by Intrauterine route once.    [provider]  lidocaine (LIDODERM) 5 % Place 1 patch onto the skin daily as needed (pain). Remove & Discard patch within 12 hours or as directed by MD 01/06/21   Kathryne Hitch, MD  losartan (COZAAR) 25 MG tablet Take 25 mg by mouth daily. 10/20/16   [provider]  metFORMIN (GLUCOPHAGE-XR) 750 MG 24 hr tablet Take 750 mg by mouth 2 (two) times daily. 03/21/18   [provider]  montelukast (SINGULAIR) 10 MG tablet Take 1 tablet (10 mg total) by mouth at bedtime. 12/25/20   Marcelyn Bruins, MD  omeprazole (PRILOSEC) 20 MG capsule Take 1 capsule (20 mg total) by mouth every morning. Patient taking differently: Take 20 mg by mouth daily. 12/02/16   Marcelyn Bruins, MD  ondansetron (ZOFRAN ODT) 4 MG disintegrating tablet Take 1 tablet (4 mg total)  by mouth every 8 (eight) hours as needed for nausea or vomiting. 05/23/20   Kathryne Hitch, MD  ondansetron (ZOFRAN) 8 MG tablet Take 8 mg by mouth every 8 (eight) hours as needed for nausea or vomiting.    [provider]  [facility] VERIO test strip as directed.  09/01/17   [provider]  rosuvastatin (CRESTOR) 10 MG tablet Take 10 mg by mouth daily. 10/20/16   [provider]  Spacer/Aero-Holding Deretha Emory DEVI Take 1 each by mouth as directed. 10/06/20  Nehemiah Settle, FNP  tiZANidine (ZANAFLEX) 4 MG tablet TAKE 1 TABLET BY MOUTH EVERY 6 HOURS AS NEEDED FOR MUSCLE SPASMS. 09/04/20   Kathryne Hitch, MD    Family History Family History  Problem Relation Age of Onset   Diabetes Mother    Heart disease Mother    Diabetes Father    Eczema Sister    Asthma Brother    Asthma Son    Eczema Daughter    Allergic rhinitis Neg Hx    Angioedema Neg Hx    Immunodeficiency Neg Hx    Urticaria Neg Hx     Social History Social History   Tobacco Use   Smoking status: Never   Smokeless tobacco: Never  Vaping Use   Vaping Use: Never used  Substance Use Topics   Alcohol use: No   Drug use: No     Allergies   Amoxicillin, Lisinopril, Olmesartan, and Bactrim   Review of Systems Review of Systems  Constitutional:  Negative for chills and fever.  HENT:  Positive for congestion and sore throat. Negative for ear pain.   Eyes:  Negative for discharge and redness.  Respiratory:  Positive for cough. Negative for shortness of breath and wheezing.   Gastrointestinal:  Negative for abdominal pain, diarrhea, nausea and vomiting.    Physical Exam Triage Vital Signs ED Triage Vitals  Enc Vitals Group     BP 02/12/21 1950 (!) 143/90     Pulse Rate 02/12/21 1950 (!) 101     Resp 02/12/21 1950 18     Temp 02/12/21 1950 98.7 F (37.1 C)     Temp Source 02/12/21 1950 Oral     SpO2 02/12/21 1950 96 %     Weight --      Height --      Head  Circumference --      Peak Flow --      Pain Score 02/12/21 1951 2     Pain Loc --      Pain Edu? --      Excl. in GC? --    No data found.  Updated Vital Signs BP (!) 143/90 (BP Location: Left Arm)   Pulse (!) 101   Temp 98.7 F (37.1 C) (Oral)   Resp 18   SpO2 96%   Physical Exam Vitals and nursing note reviewed.  Constitutional:      General: She is not in acute distress.    Appearance: Normal appearance. She is not ill-appearing.  HENT:     Head: Normocephalic and atraumatic.     Right Ear: Tympanic membrane normal.     Left Ear: Tympanic membrane normal.     Nose: Congestion present.     Mouth/Throat:     Mouth: Mucous membranes are moist.     Pharynx: No oropharyngeal exudate or posterior oropharyngeal erythema.  Eyes:     Conjunctiva/sclera: Conjunctivae normal.  Cardiovascular:     Rate and Rhythm: Normal rate and regular rhythm.     Heart sounds: Normal heart sounds. No murmur heard. Pulmonary:     Effort: Pulmonary effort is normal. No respiratory distress.     Breath sounds: Normal breath sounds. No wheezing, rhonchi or rales.  Skin:    General: Skin is warm and dry.  Neurological:     Mental Status: She is alert.  Psychiatric:        Mood and Affect: Mood normal.        Thought Content: Thought content normal.  UC Treatments / Results  Labs (all labs ordered are listed, but only abnormal results are displayed) Labs Reviewed  COVID-19, FLU A+B NAA    EKG   Radiology No results found.  Procedures Procedures (including critical care time)  Medications Ordered in UC Medications - No data to display  Initial Impression / Assessment and Plan / UC Course  I have reviewed the triage vital signs and the nursing notes.  Pertinent labs & imaging results that were available during my care of the patient were reviewed by me and considered in my medical decision making (see chart for details).    Suspect likely viral etiology of symptoms.   Will order COVID and flu screening.  Recommend symptomatic treatment, increase fluids and rest.  Encouraged follow-up with any further concerns  Final Clinical Impressions(s) / UC Diagnoses   Final diagnoses:  Acute upper respiratory infection   Discharge Instructions   None    ED Prescriptions   None    PDMP not reviewed this encounter.   Tomi Bamberger, PA-C 02/13/21 [phone]

## 2021-02-14 LAB — COVID-19, FLU A+B NAA
Influenza A, NAA: NOT DETECTED
Influenza B, NAA: NOT DETECTED
SARS-CoV-2, NAA: NOT DETECTED

## 2021-02-23 ENCOUNTER — Encounter: Payer: Self-pay | Admitting: Orthopaedic Surgery

## 2021-02-24 ENCOUNTER — Other Ambulatory Visit: Payer: Self-pay

## 2021-02-24 DIAGNOSIS — M4807 Spinal stenosis, lumbosacral region: Secondary | ICD-10-CM

## 2021-02-24 DIAGNOSIS — G8929 Other chronic pain: Secondary | ICD-10-CM

## 2021-04-08 ENCOUNTER — Telehealth: Payer: Self-pay

## 2021-04-08 ENCOUNTER — Ambulatory Visit (INDEPENDENT_AMBULATORY_CARE_PROVIDER_SITE_OTHER): Payer: Managed Care, Other (non HMO) | Admitting: Orthopaedic Surgery

## 2021-04-08 ENCOUNTER — Encounter: Payer: Self-pay | Admitting: Orthopaedic Surgery

## 2021-04-08 ENCOUNTER — Telehealth: Payer: Self-pay | Admitting: Orthopaedic Surgery

## 2021-04-08 ENCOUNTER — Other Ambulatory Visit: Payer: Self-pay

## 2021-04-08 DIAGNOSIS — M5431 Sciatica, right side: Secondary | ICD-10-CM

## 2021-04-08 DIAGNOSIS — M7061 Trochanteric bursitis, right hip: Secondary | ICD-10-CM

## 2021-04-08 DIAGNOSIS — M4807 Spinal stenosis, lumbosacral region: Secondary | ICD-10-CM | POA: Diagnosis not present

## 2021-04-08 MED ORDER — HYDROCODONE-ACETAMINOPHEN 7.5-325 MG PO TABS: 1.0000 | ORAL_TABLET | Freq: Three times a day (TID) | ORAL | 0 refills | Status: DC | PRN

## 2021-04-08 NOTE — Telephone Encounter (Signed)
Patient is here now to followup back We had ordered ESI end of year and she said she never received a call Do you know what's going on?

## 2021-04-08 NOTE — Progress Notes (Signed)
The patient comes in with continued severe low back pain.  Today it is mainly in the low assessment of her lumbar spine.  She does have degenerative changes in the facet joints of the lower lumbar spine.  She has been through physical therapy already.  She has had injections in her spine in the past but this was mainly L5-S1 to the right side where she had a disc issue.  She does have a history of a right hip replacement and a second surgery on the right hip.  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  She does ambit with a cane.  She is morbidly obese and is try to work on weight loss.  She still tries to adhere to home exercise program and takes anti-inflammatories.  She does take hydrocodone on occasion and does need a refill today.  She was denied by pain management or referral.  She is still waiting for authorization for injections again by Dr. Alvester Morin in her spine.  Based on her clinical exam findings today combined with her MRI findings and the failure of all forms of conservative treatment including rest, time, anti-inflammatories and physical therapy, I do feel that she would benefit from likely facet joint injections by Dr. Alvester Morin based on where she is hurting at the lower lumbar spine.  I think this would likely be L4-L5 and potentially L5-S1.  I did send in the hydrocodone for her and want her to use this sparingly because we do need her to wean from narcotics.  We will hopefully get her into Dr. Alvester Morin soon for intervention in her spine.  I do feel this is medically necessary given the failure conservative treatment as mentioned above for over 12 months.

## 2021-04-08 NOTE — Telephone Encounter (Signed)
Received medical records release form,$25.00 check and Disability paperwork from patient      Forwarding to [employer] today

## 2021-04-20 ENCOUNTER — Telehealth: Payer: Self-pay | Admitting: Physical Medicine and Rehabilitation

## 2021-04-20 ENCOUNTER — Encounter: Payer: Self-pay | Admitting: Physical Medicine and Rehabilitation

## 2021-04-20 NOTE — Telephone Encounter (Signed)
Received call from Crystal B. With [employer] concerning the denial for the injection. Crystal said she have not received an appeal for the denial yet.  Crystal asked if someone would follow up with the member concerning the denial? Crystal did not have a direct number to be reached.   The number to contact patient is [phone]

## 2021-04-22 ENCOUNTER — Ambulatory Visit (INDEPENDENT_AMBULATORY_CARE_PROVIDER_SITE_OTHER): Payer: Managed Care, Other (non HMO) | Admitting: Physical Medicine and Rehabilitation

## 2021-04-22 ENCOUNTER — Encounter: Payer: Self-pay | Admitting: Physical Medicine and Rehabilitation

## 2021-04-22 ENCOUNTER — Other Ambulatory Visit: Payer: Self-pay

## 2021-04-22 VITALS — BP 144/90 | HR 99

## 2021-04-22 DIAGNOSIS — M5441 Lumbago with sciatica, right side: Secondary | ICD-10-CM

## 2021-04-22 DIAGNOSIS — G8929 Other chronic pain: Secondary | ICD-10-CM

## 2021-04-22 DIAGNOSIS — M5416 Radiculopathy, lumbar region: Secondary | ICD-10-CM | POA: Diagnosis not present

## 2021-04-22 DIAGNOSIS — M4726 Other spondylosis with radiculopathy, lumbar region: Secondary | ICD-10-CM

## 2021-04-22 DIAGNOSIS — M5442 Lumbago with sciatica, left side: Secondary | ICD-10-CM | POA: Diagnosis not present

## 2021-04-22 DIAGNOSIS — M47816 Spondylosis without myelopathy or radiculopathy, lumbar region: Secondary | ICD-10-CM

## 2021-04-22 NOTE — Progress Notes (Signed)
Pt state lower back pain that travels down both legs. Pt state walking, standing, sitting and laying down makes the pain worse. Pt state she takes pain meds to help ease her pain. Pt state she has to use a wheel chair for everyday shopping. Pt has hx of inj on 11/03/20 pt state it helped with 70% relief and lasted long then before.  Numeric Pain Rating Scale and Functional Assessment Average Pain 10 Pain Right Now 8 My pain is constant, burning, tingling, and aching Pain is worse with: walking, bending, sitting, standing, some activites, and laying down Pain improves with: medication and injections   In the last MONTH (on 0-10 scale) has pain interfered with the following?  1. General activity like being  able to carry out your everyday physical activities such as walking, climbing stairs, carrying groceries, or moving a chair?  Rating(7)  2. Relation with others like being able to carry out your usual social activities and roles such as  activities at home, at work and in your community. Rating(8)  3. Enjoyment of life such that you have  been bothered by emotional problems such as feeling anxious, depressed or irritable?  Rating(9)

## 2021-04-22 NOTE — Progress Notes (Signed)
Emily Hester - 51 y.o. female MRN [id number]  Date of birth: 09-Jun-1970  Office Visit Note: Visit Date: 04/22/2021 PCP: Carol Ada, MD Referred by: Carol Ada, MD  Subjective: Chief Complaint  Patient presents with   Lower Back - Pain   Right Leg - Pain   Left Leg - Pain   HPI: Emily Hester is a 51 y.o. female who comes in today at the request of Dr. Jean Rosenthal for evaluation of chronic, worsening and severe bilateral lower back pain radiating to both anterior legs to dorsum of feet. Dr. Trevor Mace notes can be reviewed for further clinical detail. Patent reports pain has been ongoing for several months. Patient reports pain is exacerbated by walking, movement and activity, describes as constant sharp, sore and burning sensation, currently rates as 8 out of 10. Patient reports some relief of pain with use of heating pad and medications. Patients lumbar MRI from 2022 exhibits mild degenerative changes at L1-L2, L4-L5 and L5-S1, no high grade spinal canal stenosis noted. Patient continues to use Hydrocodone and Lidocaine patches to manage her pain. Patient did recently attend formal physical therapy at [facility] and reports no relief of pain with these treatments. Patient had bilateral S1 transforaminal epidural steroid injection performed by Dr. Magnus Sinning in August of 2022. She reports this injection helped significantly to alleviate her pain, states greater than 70%. We did place an order for repeated injection, however her insurance company did not approve this treatment. Patient reports her bilateral lower back pain radiating to both legs and feet is making it difficult to perform daily tasks. Patient states she now has to use a cane to assist with ambulation and requires use of wheelchair when leaving her house. Patient states her pain has decreased her functionality and causing her to be more sedentary. Patient does  have history of right total knee arthroplasty by Dr. Jean Rosenthal in 2018 with revision in 2022, states she has recovered without any issues. Patient denies focal weakness. Patient denies recent trauma or falls.   Review of Systems  Musculoskeletal:  Positive for back pain.  Neurological:  Negative for tingling, sensory change, focal weakness and weakness.  All other systems reviewed and are negative. Otherwise per HPI.  Assessment & Plan: Visit Diagnoses:    ICD-10-CM   1. Lumbar radiculopathy  M54.16     2. Chronic bilateral low back pain with bilateral sciatica  M54.42    M54.41    G89.29     3. Other spondylosis with radiculopathy, lumbar region  M47.26     4. Facet hypertrophy of lumbar region  M47.816        Plan: Findings:  Chronic, worsening and severe bilateral lower back pain radiating to anterior legs and dorsum of feet. Patient continues to have excruciating and debilitating pain despite good conservative therapies such as formal physical therapy, use of heating pad and medications.  Patient's clinical presentation and exam are consistent with L5 nerve pattern.  We believe the next step is to perform a diagnostic and hopefully therapeutic bilateral L5 transforaminal epidural steroid injection under fluoroscopic guidance. If patients bilateral lower back pain persists after epidural steroid injection we would consider performing facet joint injections. Patient encouraged to remain active and to continue using cane to assist with ambulation and prevent falls. No red flag symptoms noted upon exam today.  Prior injection did offer more than 50% relief as noted above and lasted more than 3 months  and did give the patient more functional ability for activities of daily living and was also beneficial in that it did reduce her medication requirement.  She has had have had physical therapy and continue with directed home exercise program.  Current medication management is not  beneficial in increasing her functional status.  Please note that procedures are done as part of a comprehensive orthopedic and pain management program with access to in-house orthopedics, spine surgery and physical therapy as well as access to [facility] counseling if needed.     Meds & Orders: No orders of the defined types were placed in this encounter.  No orders of the defined types were placed in this encounter.   Follow-up: Return for Bilateral L5 transforaminal epidural steroid injection.   Procedures: No procedures performed      Clinical History: MRI LUMBAR SPINE WITHOUT CONTRAST   TECHNIQUE: Multiplanar, multisequence MR imaging of the lumbar spine was performed. No intravenous contrast was administered.   COMPARISON:  MRI lumbar spine February 12, 2020.   FINDINGS: Segmentation:  Standard.   Alignment:  Normal.   Vertebrae: No focal marrow edema to suggest acute fracture or discitis/osteomyelitis. No suspicious bone lesions.   Conus medullaris and cauda equina: Conus extends to the L1 level. Conus and cauda equina appear normal.   Paraspinal and other soft tissues: Unremarkable.   Disc levels:   T12-L1: No significant disc protrusion, foraminal stenosis, or canal stenosis.   L1-L2: Similar mild disc desiccation height loss. Similar mild disc bulge without significant stenosis.   L2-L3: No significant disc protrusion, foraminal stenosis, or canal stenosis.   L3-L4: No significant disc protrusion, foraminal stenosis, or canal stenosis.   L4-L5: Similar slight disc bulging and bilateral facet arthropathy with ligamentum flavum thickening. No significant canal or foraminal stenosis.   L5-S1: Similar mild disc bulge with annular fissure. No significant stenosis.   IMPRESSION: Similar mild degenerative change at L1-L2, L4-L5, and L5-S1 (detailed above) without significant stenosis.     Electronically Signed   By:  Margaretha Sheffield M.D.   On: 12/28/2020 14:42   She reports that she has never smoked. She has never used smokeless tobacco. No results for input(s): HGBA1C, LABURIC in the last 8760 hours.  Objective:  VS:  HT:     WT:    BMI:      BP:(!) 144/90   HR:99bpm   TEMP: ( )   RESP:  Physical Exam Vitals and nursing note reviewed.  HENT:     Head: Normocephalic and atraumatic.     Right Ear: External ear normal.     Left Ear: External ear normal.     Nose: Nose normal.     Mouth/Throat:     Mouth: Mucous membranes are moist.  Eyes:     Extraocular Movements: Extraocular movements intact.  Cardiovascular:     Rate and Rhythm: Normal rate.     Pulses: Normal pulses.  Pulmonary:     Effort: Pulmonary effort is normal.  Abdominal:     General: Abdomen is flat. There is no distension.  Musculoskeletal:        General: Tenderness present.     Cervical back: Normal range of motion.     Comments: Pt is slow to rise from seated position to standing. Good lumbar range of motion. Strong distal strength without clonus, no pain upon palpation of greater trochanters. Sensation intact bilaterally. Dysesthesias noted to bilateral L5 dermatomes. Ambulates with cane, gait slow and  unsteady.  Skin:    General: Skin is warm and dry.     Capillary Refill: Capillary refill takes less than 2 seconds.  Neurological:     Mental Status: She is alert and oriented to person, place, and time.     Gait: Gait abnormal.  Psychiatric:        Mood and Affect: Mood normal.        Behavior: Behavior normal.    Ortho Exam  Imaging: No results found.  Past Medical/Family/Surgical/Social History: Medications & Allergies reviewed per EMR, new medications updated. 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[place]) 05/20/2020   Status post revision of total hip 05/20/2020   Trochanteric bursitis, right hip 09/05/2019   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    Unilateral primary osteoarthritis, right hip 01/27/2017   Past Medical History:  Diagnosis Date   Arthritis    patient denies   Asthma    Diabetes mellitus without complication ([place])    type    GERD (gastroesophageal reflux disease)    Hypertension    Interstitial cystitis    IUD (intrauterine device) in place placed 6 weeks ago   Migraines    MIgraines- not current     Pneumonia    Family History  Problem Relation Age of Onset   Diabetes Mother    Heart disease Mother    Diabetes Father    Eczema Sister    Asthma Brother    Asthma Son    Eczema Daughter    Allergic rhinitis Neg Hx    Angioedema Neg Hx    Immunodeficiency Neg Hx    Urticaria Neg Hx    Past Surgical History:  Procedure Laterality Date   ANTERIOR HIP REVISION Right 05/20/2020   Procedure: RIGHT TOTAL HIP REVISION- ploy exhange and hip ball exchange with bone graft;  Surgeon: Mcarthur Rossetti, MD;  Location: [facility];  Service: Orthopedics;  Laterality: Right;   DILATION AND EVACUATION  09/30/2011   Procedure: DILATATION AND EVACUATION;  Surgeon: Allena Katz, MD;  Location: [facility];  Service: Gynecology;  Laterality: N/A;   left knee surgery      arthroscopy    TOTAL HIP ARTHROPLASTY Right 03/11/2017   Procedure: RIGHT TOTAL HIP ARTHROPLASTY ANTERIOR APPROACH;  Surgeon: Mcarthur Rossetti, MD;  Location: [facility];  Service: Orthopedics;  Laterality: Right;   Social History   Occupational History   Not on file  Tobacco Use   Smoking status: Never   Smokeless tobacco: Never  Vaping Use   Vaping Use: Never used  Substance and Sexual Activity   Alcohol use: No   Drug use: No   Sexual activity: Not on file

## 2021-05-11 ENCOUNTER — Encounter: Payer: Self-pay | Admitting: Physical Medicine and Rehabilitation

## 2021-05-28 ENCOUNTER — Encounter: Payer: Self-pay | Admitting: Physical Medicine and Rehabilitation

## 2021-05-28 ENCOUNTER — Ambulatory Visit (INDEPENDENT_AMBULATORY_CARE_PROVIDER_SITE_OTHER): Payer: Managed Care, Other (non HMO) | Admitting: Physical Medicine and Rehabilitation

## 2021-05-28 ENCOUNTER — Other Ambulatory Visit: Payer: Self-pay

## 2021-05-28 ENCOUNTER — Ambulatory Visit: Payer: Managed Care, Other (non HMO)

## 2021-05-28 VITALS — BP 146/87 | HR 81

## 2021-05-28 DIAGNOSIS — M4726 Other spondylosis with radiculopathy, lumbar region: Secondary | ICD-10-CM | POA: Diagnosis not present

## 2021-05-28 MED ORDER — BUPIVACAINE HCL 0.5 % IJ SOLN: 3.0000 mL | Freq: Once | INTRAMUSCULAR | Status: DC

## 2021-05-28 NOTE — Progress Notes (Signed)
Pt state lower back pain that travels down both legs. Pt state walking, standing, sitting and laying down makes the pain worse. Pt state she takes pain meds to help ease her pain.  ? ?Numeric Pain Rating Scale and Functional Assessment ?Average Pain 8 ? ? ?In the last MONTH (on 0-10 scale) has pain interfered with the following? ? ?1. General activity like being  able to carry out your everyday physical activities such as walking, climbing stairs, carrying groceries, or moving a chair?  ?Rating(10) ? ? ?+Driver, -BT, -Dye Allergies. ? ?

## 2021-05-28 NOTE — Patient Instructions (Signed)

## 2021-06-02 ENCOUNTER — Telehealth: Payer: Self-pay | Admitting: Orthopaedic Surgery

## 2021-06-02 NOTE — Telephone Encounter (Signed)
Ic patient,lmvm to [facility]. Regarding fmla forms for her spouse ?

## 2021-06-03 NOTE — Telephone Encounter (Signed)
Is this ok to do?

## 2021-06-03 NOTE — Telephone Encounter (Signed)
I spoke with patient. She states her husband is needing the FMLA intermittent leave to take her to appts and to stay home with her on her "bad days". ?

## 2021-06-03 NOTE — Telephone Encounter (Signed)
Anything I need to do? Do I need to do a letter for him? ?

## 2021-06-04 NOTE — Telephone Encounter (Signed)
Noted for [employer] ?

## 2021-06-09 NOTE — Procedures (Signed)
Lumbosacral Transforaminal Epidural Steroid Injection - Sub-Pedicular Approach with Fluoroscopic Guidance ? ?[name]      ?Date of Birth: 02-Apr-1970 ?MRN: [id number] ?PCP: Merri Brunette, MD      ?Visit Date: 05/28/2021 ?  ?Universal Protocol:    ?Date/Time: 05/28/2021 ? ?Consent Given By: the patient ? ?Position: PRONE ? ?Additional Comments: ?Vital signs were monitored before and after the procedure. ?Patient was prepped and draped in the usual sterile fashion. ?The correct patient, procedure, and site was verified. ? ? ?Injection Procedure Details:  ? ?Procedure diagnoses: Other spondylosis with radiculopathy, lumbar region [M47.26]   ? ?Meds Administered:  ?Meds ordered this encounter  ?Medications  ? bupivacaine (MARCAINE) 0.5 % (with pres) injection 3 mL  ? ? ?Laterality: Bilateral ? ?Location/Site: L5 ? ?Needle:5.0 in., 22 ga.  Short bevel or Quincke spinal needle ? ?Needle Placement: Transforaminal ? ?Findings: ?  ? -Comments: Excellent flow of contrast along the nerve, nerve root and into the epidural space. ? ?Procedure Details: ?After squaring off the end-plates to get a true AP view, the C-arm was positioned so that an oblique view of the foramen as noted above was visualized. The target area is just inferior to the "nose of the scotty dog" or sub pedicular. The soft tissues overlying this structure were infiltrated with 2-3 ml. of 1% Lidocaine without Epinephrine. ? ?The spinal needle was inserted toward the target using a "trajectory" view along the fluoroscope beam.  Under AP and lateral visualization, the needle was advanced so it did not puncture dura and was located close the 6 O'Clock position of the pedical in AP tracterory. Biplanar projections were used to confirm position. Aspiration was confirmed to be negative for CSF and/or blood. A 1-2 ml. volume of Isovue-250 was injected and flow of contrast was noted at each level. Radiographs were obtained for documentation purposes.   ? ?After attaining the desired flow of contrast documented above, a 0.5 to 1.0 ml test dose of 0.25% Marcaine was injected into each respective transforaminal space.  The patient was observed for 90 seconds post injection.  After no sensory deficits were reported, and normal lower extremity motor function was noted,   the above injectate was administered so that equal amounts of the injectate were placed at each foramen (level) into the transforaminal epidural space. ? ? ?Additional Comments:  ?The patient tolerated the procedure well ?Dressing: 2 x 2 sterile gauze and Band-Aid ?  ? ?Post-procedure details: ?Patient was observed during the procedure. ?Post-procedure instructions were reviewed. ? ?Patient left the clinic in stable condition. ? ?

## 2021-06-09 NOTE — Progress Notes (Signed)
? ?Emily Hester - 51 y.o. female MRN [id number]  Date of birth: 06-13-1970 ? ?Office Visit Note: ?Visit Date: 05/28/2021 ?PCP: Merri Brunette, MD ?Referred by: Merri Brunette, MD ? ?Subjective: ?Chief Complaint  ?Patient presents with  ? Lower Back - Pain  ? Left Leg - Pain  ? Right Leg - Pain  ? ?HPI:  Emily Hester is a 51 y.o. female who comes in today at the request of Ellin Goodie, FNP for planned Bilateral L5-S1 Lumbar Transforaminal epidural steroid injection with fluoroscopic guidance.  The patient has failed conservative care including home exercise, medications, time and activity modification.  This injection will be diagnostic and hopefully therapeutic.  Please see requesting physician notes for further details and justification. ? ?ROS Otherwise per HPI. ? ?Assessment & Plan: ?Visit Diagnoses:  ?  ICD-10-CM   ?1. Other spondylosis with radiculopathy, lumbar region  M47.26 XR C-ARM NO REPORT  ?  DISCONTINUED: bupivacaine (MARCAINE) 0.5 % (with pres) injection 3 mL  ?  CANCELED: Facet Injection  ?  ?2. Lumbar radiculopathy  M54.16 Epidural Steroid injection  ?  ?  ?Plan: No additional findings.  ? ?Meds & Orders:  ?Meds ordered this encounter  ?Medications  ? DISCONTD: bupivacaine (MARCAINE) 0.5 % (with pres) injection 3 mL  ?  ?Orders Placed This Encounter  ?Procedures  ? XR C-ARM NO REPORT  ? Epidural Steroid injection  ?  ?Follow-up: Return if symptoms worsen or fail to improve.  ? ?Procedures: ?No procedures performed  ?Lumbosacral Transforaminal Epidural Steroid Injection - Sub-Pedicular Approach with Fluoroscopic Guidance ? ?[name]      ?Date of Birth: 1970/04/30 ?MRN: [id number] ?PCP: Merri Brunette, MD      ?Visit Date: 05/28/2021 ?  ?Universal Protocol:    ?Date/Time: 05/28/2021 ? ?Consent Given By: the patient ? ?Position: PRONE ? ?Additional Comments: ?Vital signs were monitored before and after the procedure. ?Patient was prepped and draped in the usual  sterile fashion. ?The correct patient, procedure, and site was verified. ? ? ?Injection Procedure Details:  ? ?Procedure diagnoses: Other spondylosis with radiculopathy, lumbar region [M47.26]   ? ?Meds Administered:  ?Meds ordered this encounter  ?Medications  ? bupivacaine (MARCAINE) 0.5 % (with pres) injection 3 mL  ? ? ?Laterality: Bilateral ? ?Location/Site: L5 ? ?Needle:5.0 in., 22 ga.  Short bevel or Quincke spinal needle ? ?Needle Placement: Transforaminal ? ?Findings: ?  ? -Comments: Excellent flow of contrast along the nerve, nerve root and into the epidural space. ? ?Procedure Details: ?After squaring off the end-plates to get a true AP view, the C-arm was positioned so that an oblique view of the foramen as noted above was visualized. The target area is just inferior to the "nose of the scotty dog" or sub pedicular. The soft tissues overlying this structure were infiltrated with 2-3 ml. of 1% Lidocaine without Epinephrine. ? ?The spinal needle was inserted toward the target using a "trajectory" view along the fluoroscope beam.  Under AP and lateral visualization, the needle was advanced so it did not puncture dura and was located close the 6 O'Clock position of the pedical in AP tracterory. Biplanar projections were used to confirm position. Aspiration was confirmed to be negative for CSF and/or blood. A 1-2 ml. volume of Isovue-250 was injected and flow of contrast was noted at each level. Radiographs were obtained for documentation purposes.  ? ?After attaining the desired flow of contrast documented above, a 0.5 to 1.0 ml test dose of 0.25%  Marcaine was injected into each respective transforaminal space.  The patient was observed for 90 seconds post injection.  After no sensory deficits were reported, and normal lower extremity motor function was noted,   the above injectate was administered so that equal amounts of the injectate were placed at each foramen (level) into the transforaminal epidural  space. ? ? ?Additional Comments:  ?The patient tolerated the procedure well ?Dressing: 2 x 2 sterile gauze and Band-Aid ?  ? ?Post-procedure details: ?Patient was observed during the procedure. ?Post-procedure instructions were reviewed. ? ?Patient left the clinic in stable condition. ?  ? ?Clinical History: ?No specialty comments available.  ? ? ? ?Objective:  VS:  HT:    WT:   BMI:     BP:(!) 146/87  HR:81bpm  TEMP: ( )  RESP:  ?Physical Exam ?Vitals and nursing note reviewed.  ?Constitutional:   ?   General: She is not in acute distress. ?   Appearance: Normal appearance. She is obese. She is not ill-appearing.  ?HENT:  ?   Head: Normocephalic and atraumatic.  ?   Right Ear: External ear normal.  ?   Left Ear: External ear normal.  ?Eyes:  ?   Extraocular Movements: Extraocular movements intact.  ?Cardiovascular:  ?   Rate and Rhythm: Normal rate.  ?   Pulses: Normal pulses.  ?Pulmonary:  ?   Effort: Pulmonary effort is normal. No respiratory distress.  ?Abdominal:  ?   General: There is no distension.  ?   Palpations: Abdomen is soft.  ?Musculoskeletal:     ?   General: Tenderness present.  ?   Cervical back: Neck supple.  ?   Right lower leg: No edema.  ?   Left lower leg: No edema.  ?   Comments: Patient has good distal strength with no pain over the greater trochanters.  No clonus or focal weakness.  ?Skin: ?   Findings: No erythema, lesion or rash.  ?Neurological:  ?   General: No focal deficit present.  ?   Mental Status: She is alert and oriented to person, place, and time.  ?   Sensory: No sensory deficit.  ?   Motor: No weakness or abnormal muscle tone.  ?   Coordination: Coordination normal.  ?Psychiatric:     ?   Mood and Affect: Mood normal.     ?   Behavior: Behavior normal.  ?  ? ?Imaging: ?No results found. ?

## 2021-06-25 ENCOUNTER — Encounter: Payer: Self-pay | Admitting: Allergy

## 2021-06-25 ENCOUNTER — Ambulatory Visit (INDEPENDENT_AMBULATORY_CARE_PROVIDER_SITE_OTHER): Payer: Managed Care, Other (non HMO) | Admitting: Allergy

## 2021-06-25 VITALS — BP 120/84 | HR 84 | Temp 97.9°F | Resp 16 | Ht 65.5 in | Wt 274.4 lb

## 2021-06-25 DIAGNOSIS — H1013 Acute atopic conjunctivitis, bilateral: Secondary | ICD-10-CM | POA: Diagnosis not present

## 2021-06-25 DIAGNOSIS — J3089 Other allergic rhinitis: Secondary | ICD-10-CM | POA: Diagnosis not present

## 2021-06-25 DIAGNOSIS — J4520 Mild intermittent asthma, uncomplicated: Secondary | ICD-10-CM

## 2021-06-25 DIAGNOSIS — T7800XA Anaphylactic reaction due to unspecified food, initial encounter: Secondary | ICD-10-CM

## 2021-06-25 MED ORDER — CETIRIZINE HCL 10 MG PO TABS: 10.0000 mg | ORAL_TABLET | Freq: Every day | ORAL | 1 refills | Status: DC

## 2021-06-25 NOTE — Patient Instructions (Signed)
Asthma -Continue Symbicort 160mcg 2 puffs twice a day with spacer to help prevent cough and wheeze.   - Continue Singulair (montelukast) 10mg nightly - have access to albuterol inhaler 2 puffs every 4-6 hours as needed for cough/wheeze/shortness of breath/chest tightness.  May use 15-20 minutes prior to activity.   Monitor frequency of use.  - recommend use of your albuterol prior to outdoor activity, grilling etc - continue to do your best to avoid fragrance products Asthma control goals:  Full participation in all desired activities (may need albuterol before activity) Albuterol use two time or less a week on average (not counting use with activity) Cough interfering with sleep two time or less a month Oral steroids no more than once a year No hospitalizations  Allergic Rhinitis with conjunctivitis - continue allergen avoidance measures - continue Zyrtec (Cetirizine) 10 mg daily  - continue Singulair (montelukast) nightly - for nasal drainage/post-nasal drip use nasal Atrovent 0.06% 2 sprays each nostril up to 3-4 times a day as needed - use over-the-counter Pataday Xtra Strength, Pataday 1 drop each eye daily or Zaditor or Alaway 1 drop each eye twice a day as needed for itchy/watery/red eyes.   These are all over-the-counter allergy eyedrop you can use.   Food allergy Continue to avoid peanuts and tree nuts after not passing tree nut challenge. In case of an allergic reaction, give Benadryl 4 teaspoonfuls every 4 hours, and if life-threatening symptoms occur, inject with EpiPen 0.3 mg  Follow-up in 6 months or sooner if needed 

## 2021-06-25 NOTE — Progress Notes (Signed)
? ? ?Follow-up Note ? ?RE: Emily Hester MRN: [id number] DOB: [birth year] ?Date of Office Visit: 06/25/2021 ? ? ?History of present illness: ?Emily Hester is a 51 y.o. female presenting today for follow-up of asthma, allergic rhinoconjunctivitis and food allergy.  She was last seen in the office on 12/25/2020 by myself.  She has been doing well symptoms without any major health changes, surgeries or hospitalizations.  She states the pollen season has not really affected her much at all.  The only thing she is noting as of late is some drainage and throat clearing.  She has been out of Zyrtec for couple weeks now.  She states she needs to start back using her nasal Atrovent spray.  She does take Singulair daily.  She denies any daytime or nighttime asthma symptoms.   ?She has not used albuterol in at least the past 2 months or longer.  She has not had any urgent care, ED or any systemic steroid needs.  She continues to use Symbicort 160 mcg 2 puffs twice a day with spacer device.  She is not sure if she using her spacer correctly as she is not sure if she is getting the medication in.  She states she knows it is not supposed to make a sound when she inhales. ? ?Review of systems: ?Review of Systems  ?Constitutional: Negative.   ?HENT:  Positive for postnasal drip.   ?Eyes: Negative.   ?Respiratory: Negative.    ?Cardiovascular: Negative.   ?Gastrointestinal: Negative.   ?Musculoskeletal: Negative.   ?Skin: Negative.   ?Allergic/Immunologic: Negative.   ?Neurological: Negative.    ? ?All other systems negative unless noted above in HPI ? ?Past medical/social/surgical/family history have been reviewed and are unchanged unless specifically indicated below. ? ?No changes ? ?Medication List: ?Current Outpatient Medications  ?Medication Sig Dispense Refill  ? acetaminophen (TYLENOL) 500 MG tablet Take 1,000 mg by mouth every 6 (six) hours as needed for moderate pain.    ? albuterol (PROVENTIL) (2.5 MG/3ML)  0.083% nebulizer solution Take 3 mLs (2.5 mg total) by nebulization every 4 (four) hours as needed for wheezing or shortness of breath. 75 mL 1  ? albuterol (VENTOLIN HFA) 108 (90 Base) MCG/ACT inhaler Inhale 1 Puff/kg into the lungs as needed.    ? amLODipine (NORVASC) 10 MG tablet Take 10 mg by mouth every morning.    ? aspirin-acetaminophen-caffeine (EXCEDRIN MIGRAINE) 250-250-65 MG tablet Take 2 tablets by mouth every 6 (six) hours as needed for headache.    ? budesonide-formoterol (SYMBICORT) 160-4.5 MCG/ACT inhaler Inhale 2 puffs into the lungs 2 (two) times daily. 1 each 5  ? Cholecalciferol (VITAMIN D) 50 MCG (2000 UT) tablet Take 2,000 Units by mouth daily.    ? clindamycin (CLEOCIN) 150 MG capsule Take 600 mg by mouth See admin instructions. Take 600 mg 1 hour prior to dental work    ? diphenhydrAMINE (BENADRYL) 25 MG tablet Take 25 mg by mouth daily as needed for allergies.    ? EPINEPHrine 0.3 mg/0.3 mL IJ SOAJ injection Inject 0.3 mg into the muscle as directed.    ? gabapentin (NEURONTIN) 100 MG capsule Take 1 capsule (100 mg total) by mouth at bedtime. 30 capsule 0  ? HYDROcodone-acetaminophen (NORCO) 7.5-325 MG tablet Take 1 tablet by mouth 3 (three) times daily as needed for moderate pain. 30 tablet 0  ? ibuprofen (ADVIL) 200 MG tablet Take 600 mg by mouth every 6 (six) hours as needed for headache or moderate pain.    ?  ipratropium (ATROVENT) 0.06 % nasal spray Apply 2 sprays each nostril up to 3-4 times a day as needed 45 mL 1  ? levonorgestrel (MIRENA) 20 MCG/24HR IUD 1 each by Intrauterine route once.    ? lidocaine (LIDODERM) 5 % Place 1 patch onto the skin daily as needed (pain). Remove & Discard patch within 12 hours or as directed by MD 30 patch 3  ? losartan (COZAAR) 25 MG tablet Take 25 mg by mouth daily.  0  ? metFORMIN (GLUCOPHAGE-XR) 750 MG 24 hr tablet Take 750 mg by mouth 2 (two) times daily.    ? montelukast (SINGULAIR) 10 MG tablet Take 1 tablet (10 mg total) by mouth at bedtime. 90  tablet 1  ? omeprazole (PRILOSEC) 20 MG capsule Take 1 capsule (20 mg total) by mouth every morning. (Patient taking differently: Take 20 mg by mouth daily.) 30 capsule 5  ? ondansetron (ZOFRAN) 8 MG tablet Take 8 mg by mouth every 8 (eight) hours as needed for nausea or vomiting.    ? ONETOUCH VERIO test strip as directed.   3  ? rosuvastatin (CRESTOR) 10 MG tablet Take 10 mg by mouth daily.  3  ? Semaglutide, 2 MG/DOSE, (OZEMPIC, 2 MG/DOSE,) 8 MG/3ML SOPN Inject 2 mg into the skin once a week.    ? Spacer/Aero-Holding Chambers DEVI Take 1 each by mouth as directed. 1 each 0  ? tiZANidine (ZANAFLEX) 4 MG tablet TAKE 1 TABLET BY MOUTH EVERY 6 HOURS AS NEEDED FOR MUSCLE SPASMS. 60 tablet 0  ? traMADol (ULTRAM) 50 MG tablet Take 1 tablet by mouth as needed.    ? cetirizine (ZYRTEC) 10 MG tablet Take 1 tablet (10 mg total) by mouth daily. 90 tablet 1  ? ?No current facility-administered medications for this visit.  ?  ? ?Known medication allergies: ?Allergies  ?Allergen Reactions  ? Amoxicillin Hives, Swelling and Other (See Comments)  ?  Has patient had a PCN reaction causing immediate rash, facial/tongue/throat swelling, SOB or lightheadedness with hypotension: Yes ?Has patient had a PCN reaction causing severe rash involving mucus membranes or skin necrosis: No ?Has patient had a PCN reaction that required hospitalization: Yes ?Has patient had a PCN reaction occurring within the last 10 years: No  ?If all of the above answers are "NO", then may proceed with Cephalosporin use. ?  ? Lisinopril Itching and Cough  ?  Heart racing   ? Olmesartan   ?  jittery  ? Bactrim Rash  ? ? ? ?Physical examination: ?Blood pressure 120/84, pulse 84, temperature 97.9 ?F (36.6 ?C), resp. rate 16, height 5' 5.5" (1.664 m), weight 274 lb 6.4 oz (124.5 kg), SpO2 99 %. ? ?General: Alert, interactive, in no acute distress. ?HEENT: PERRLA, TMs pearly gray, turbinates non-edematous without discharge, post-pharynx non  erythematous. ?Neck: Supple without lymphadenopathy. ?Lungs: Clear to auscultation without wheezing, rhonchi or rales. {no increased work of breathing. ?CV: Normal S1, S2 without murmurs. ?Abdomen: Nondistended, nontender. ?Skin: Warm and dry, without lesions or rashes. ?Extremities:  No clubbing, cyanosis or edema. ?Neuro:   Grossly intact. ? ?Diagnositics/Labs: ? ?Spirometry: FEV1: 1.55 L 65%, FVC: 2.11 L 70% predicted ? ?Assessment and plan: ?  ?Asthma ?-Continue Symbicort 127mcg 2 puffs twice a day with spacer to help prevent cough and wheeze.   ?- Continue Singulair (montelukast) 10mg  nightly ?- have access to albuterol inhaler 2 puffs every 4-6 hours as needed for cough/wheeze/shortness of breath/chest tightness.  May use 15-20 minutes prior to activity.  Monitor frequency of use.  ?- recommend use of your albuterol prior to outdoor activity, grilling etc ?- continue to do your best to avoid fragrance products ?Asthma control goals:  ?Full participation in all desired activities (may need albuterol before activity) ?Albuterol use two time or less a week on average (not counting use with activity) ?Cough interfering with sleep two time or less a month ?Oral steroids no more than once a year ?No hospitalizations ? ?Allergic Rhinitis with conjunctivitis ?- continue allergen avoidance measures ?- continue Zyrtec (Cetirizine) 10 mg daily  ?- continue Singulair (montelukast) nightly ?- for nasal drainage/post-nasal drip use nasal Atrovent 0.06% 2 sprays each nostril up to 3-4 times a day as needed ?- use over-the-counter Pataday Xtra Strength, Pataday 1 drop each eye daily or Zaditor or Alaway 1 drop each eye twice a day as needed for itchy/watery/red eyes.   These are all over-the-counter allergy eyedrop you can use.  ? ?Food allergy ?Continue to avoid peanuts and tree nuts after not passing tree nut challenge. In case of an allergic reaction, give Benadryl 4 teaspoonfuls every 4 hours, and if life-threatening  symptoms occur, inject with EpiPen 0.3 mg ? ?Follow-up in 6 months or sooner if needed ?I appreciate the opportunity to take part in Saray's care. Please do not hesitate to contact me with questions. ? ?Sincerely, ? ? ?Surveyor, minerals

## 2021-06-29 ENCOUNTER — Ambulatory Visit (INDEPENDENT_AMBULATORY_CARE_PROVIDER_SITE_OTHER): Payer: Managed Care, Other (non HMO) | Admitting: Orthopaedic Surgery

## 2021-06-29 ENCOUNTER — Encounter: Payer: Self-pay | Admitting: Orthopaedic Surgery

## 2021-06-29 DIAGNOSIS — M7062 Trochanteric bursitis, left hip: Secondary | ICD-10-CM

## 2021-06-29 DIAGNOSIS — M7061 Trochanteric bursitis, right hip: Secondary | ICD-10-CM | POA: Diagnosis not present

## 2021-06-29 MED ORDER — METHYLPREDNISOLONE ACETATE 40 MG/ML IJ SUSP
40.0000 mg | INTRAMUSCULAR | Status: AC | PRN
  Administered 2021-06-29: 40 mg via INTRA_ARTICULAR

## 2021-06-29 MED ORDER — LIDOCAINE HCL 1 % IJ SOLN
3.0000 mL | INTRAMUSCULAR | Status: AC | PRN
  Administered 2021-06-29: 3 mL

## 2021-06-29 NOTE — Progress Notes (Signed)
? ?Office Visit Note ?  ?[name]           ?Date of Birth: 01/10/1971           ?MRN: [id number] ?Visit Date: 06/29/2021 ?             ?Requested by: Merri BrunetteSmith, Candace, MD ?[phone] ?[address] ?[place],  [place] [id number] ?PCP: Merri BrunetteSmith, Candace, MD ? ? ?Assessment & Plan: ?Visit Diagnoses:  ?1. Trochanteric bursitis, right hip   ?2. Trochanteric bursitis, left hip   ? ? ?Plan: I did provide a steroid injection over both hip trochanteric areas and that she tolerated well.  At this point I would like to send [facility] to [facility] [place] to see if there is any therapy that they can offer for her hips and her back with any modalities per the therapist discretion.  I will see her back in 3 months to see how she is doing overall but no x-rays are needed. ? ?Follow-Up Instructions: Return in about 3 months (around 09/28/2021).  ? ?Orders:  ?Orders Placed This Encounter  ?Procedures  ? Large Joint Inj  ? Large Joint Inj  ? ?No orders of the defined types were placed in this encounter. ? ? ? ? Procedures: ?Large Joint Inj: R greater trochanter on 06/29/2021 3:48 PM ?Indications: pain and diagnostic evaluation ?Details: 22 G 1.5 in needle, lateral approach ? ?Arthrogram: No ? ?Medications: 3 mL lidocaine 1 %; 40 mg methylPREDNISolone acetate 40 MG/ML ?Outcome: tolerated well, no immediate complications ?Procedure, treatment alternatives, risks and benefits explained, specific risks discussed. Consent was given by the patient. Immediately prior to procedure a time out was called to verify the correct patient, procedure, equipment, support staff and site/side marked as required. Patient was prepped and draped in the usual sterile fashion.  ? ? ?Large Joint Inj: L greater trochanter on 06/29/2021 3:48 PM ?Indications: pain and diagnostic evaluation ?Details: 22 G 1.5 in needle, lateral approach ? ?Arthrogram: No ? ?Medications: 3 mL lidocaine 1 %; 40 mg methylPREDNISolone acetate 40 MG/ML ?Outcome:  tolerated well, no immediate complications ?Procedure, treatment alternatives, risks and benefits explained, specific risks discussed. Consent was given by the patient. Immediately prior to procedure a time out was called to verify the correct patient, procedure, equipment, support staff and site/side marked as required. Patient was prepped and draped in the usual sterile fashion.  ? ? ? ? ?Clinical Data: ?No additional findings. ? ? ?Subjective: ?Chief Complaint  ?Patient presents with  ? Left Hip - Follow-up  ? Right Hip - Follow-up  ?Emily Hester comes in today with continued chronic pain in both of her hips over the trochanteric bursa area.  She has also had chronic pain in her low back and injections by Dr. Alvester MorinNewton have not helped.  She is not really a surgical candidate as a relates to the anatomy of her back in terms of not have any significant nerve compression.  We have performed a right hip bursectomy which did not help and she has had right total hip arthroplasty and then a revision to a hip ball and liner they gave her equal leg lengths.  We did not find loosening of the femoral component.  She is still dealing with chronic pain in all of these areas.  She last had a steroid injection in her left hip trochanteric area 8 months ago.  She is requesting if the steroid injections on both of those areas today at her hips.  She has been through outpatient physical therapy before. ? ?HPI ? ?Review of Systems ?She currently denies any fever, chills, nausea, vomiting ? ?Objective: ?Vital Signs: There were no vitals taken for this visit. ? ?Physical Exam ?She is alert and oriented x3 and in no acute distress ?Ortho Exam ?Examination of both her hips show the move smoothly and fluidly.  She has significant pain to even barely touching the trochanteric area of both hips. ?Specialty Comments:  ?No specialty comments available. ? ?Imaging: ?No results found. ? ? ?PMFS History: ?Patient Active Problem List  ?  Diagnosis Date Noted  ? 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 Status post revision of total hip 05/20/2020  ? Trochanteric bursitis, right hip 09/05/2019  ? 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 Unilateral primary osteoarthritis, right hip 01/27/2017  ? ?Past Medical History:  ?Diagnosis Date  ? Arthritis   ? patient denies  ? Asthma   ? Diabetes mellitus without complication ([facility])   ? type   ? GERD (gastroesophageal reflux disease)   ? Hypertension   ? Interstitial cystitis   ? IUD (intrauterine device) in place placed 6 weeks ago  ? Migraines   ? MIgraines- not current    ? Pneumonia   ?  ?Family History  ?Problem Relation Age of Onset  ? Diabetes Mother   ? Heart disease Mother   ? Diabetes Father   ? Eczema Sister   ? Asthma Brother   ? Asthma Son   ? Eczema Daughter   ? Allergic rhinitis Neg Hx   ? Angioedema Neg Hx   ? Immunodeficiency Neg Hx   ? Urticaria Neg Hx   ?  ?Past Surgical History:  ?Procedure Laterality Date  ? ANTERIOR HIP REVISION Right 05/20/2020  ? Procedure: RIGHT TOTAL HIP REVISION- ploy exhange and hip ball exchange with bone graft;  Surgeon: Mcarthur Rossetti, MD;  Location: [facility];  Service: Orthopedics;  Laterality: Right;  ? DILATION AND EVACUATION  09/30/2011  ? Procedure: DILATATION AND EVACUATION;  Surgeon: Allena Katz, MD;  Location: [facility];  Service: Gynecology;  Laterality: N/A;  ? left knee surgery     ? arthroscopy   ? TOTAL HIP ARTHROPLASTY Right 03/11/2017  ? Procedure: RIGHT TOTAL HIP ARTHROPLASTY ANTERIOR APPROACH;  Surgeon: Mcarthur Rossetti, MD;  Location: [facility];  Service: Orthopedics;  Laterality: Right;  ? ?Social History  ? ?Occupational History  ? Not on file  ?Tobacco Use  ? Smoking status: Never  ? Smokeless tobacco: Never  ?Vaping Use  ? Vaping Use: Never used  ?Substance and Sexual Activity  ? Alcohol use: No  ? Drug use: No  ? Sexual activity: Not on file  ? ? ? ? ? ? ?

## 2021-07-05 ENCOUNTER — Other Ambulatory Visit: Payer: Self-pay | Admitting: Orthopaedic Surgery

## 2021-07-06 MED ORDER — TIZANIDINE HCL 4 MG PO TABS: 4.0000 mg | ORAL_TABLET | Freq: Four times a day (QID) | ORAL | 0 refills | Status: DC | PRN

## 2021-07-06 MED ORDER — HYDROCODONE-ACETAMINOPHEN 7.5-325 MG PO TABS: 1.0000 | ORAL_TABLET | Freq: Three times a day (TID) | ORAL | 0 refills | Status: DC | PRN

## 2021-07-08 ENCOUNTER — Telehealth: Payer: Self-pay

## 2021-07-08 ENCOUNTER — Other Ambulatory Visit: Payer: Self-pay | Admitting: Orthopaedic Surgery

## 2021-07-08 MED ORDER — HYDROCODONE-ACETAMINOPHEN 7.5-325 MG PO TABS: 1.0000 | ORAL_TABLET | Freq: Three times a day (TID) | ORAL | 0 refills | Status: DC | PRN

## 2021-07-08 NOTE — Telephone Encounter (Signed)
[facility]'t [facility] can we send it to [facility] on [address] instead? ?

## 2021-07-25 ENCOUNTER — Other Ambulatory Visit: Payer: Self-pay | Admitting: Orthopaedic Surgery

## 2021-08-08 ENCOUNTER — Other Ambulatory Visit: Payer: Self-pay | Admitting: Orthopaedic Surgery

## 2021-08-25 ENCOUNTER — Other Ambulatory Visit: Payer: Self-pay | Admitting: Orthopaedic Surgery

## 2021-09-20 ENCOUNTER — Other Ambulatory Visit: Payer: Self-pay | Admitting: Orthopaedic Surgery

## 2021-09-21 MED ORDER — HYDROCODONE-ACETAMINOPHEN 7.5-325 MG PO TABS: 1.0000 | ORAL_TABLET | Freq: Three times a day (TID) | ORAL | 0 refills | Status: DC | PRN

## 2021-09-28 ENCOUNTER — Ambulatory Visit (INDEPENDENT_AMBULATORY_CARE_PROVIDER_SITE_OTHER): Payer: Managed Care, Other (non HMO) | Admitting: Orthopaedic Surgery

## 2021-09-28 ENCOUNTER — Encounter: Payer: Self-pay | Admitting: Orthopaedic Surgery

## 2021-09-28 DIAGNOSIS — M7062 Trochanteric bursitis, left hip: Secondary | ICD-10-CM | POA: Diagnosis not present

## 2021-09-28 DIAGNOSIS — M7061 Trochanteric bursitis, right hip: Secondary | ICD-10-CM

## 2021-09-28 MED ORDER — LIDOCAINE HCL 1 % IJ SOLN
3.0000 mL | INTRAMUSCULAR | Status: AC | PRN
  Administered 2021-09-28: 3 mL

## 2021-09-28 MED ORDER — METHYLPREDNISOLONE ACETATE 40 MG/ML IJ SUSP
40.0000 mg | INTRAMUSCULAR | Status: AC | PRN
  Administered 2021-09-28: 40 mg via INTRA_ARTICULAR

## 2021-09-28 NOTE — Progress Notes (Signed)
Office Visit Note   [name]           Date of Birth: Jun 09, 1970           MRN: [id number] Visit Date: 09/28/2021              Requested by: Merri Brunette, MD [phone] [place],  [place] [address] PCP: Merri Brunette, MD   Assessment & Plan: Visit Diagnoses:  1. Trochanteric bursitis, right hip   2. Trochanteric bursitis, left hip     Plan: I did place a steroid injection around both trochanteric areas which she tolerated well.  She will continue to watch her blood glucose closely and work on weight loss.  I gave her another prescription to consider outpatient physical therapy on her spine and hips with [facility].  Follow-Up Instructions: Return if symptoms worsen or fail to improve.   Orders:  Orders Placed This Encounter  Procedures   Large Joint Inj   Large Joint Inj   No orders of the defined types were placed in this encounter.     Procedures: Large Joint Inj: R greater trochanter on 09/28/2021 3:38 PM Indications: pain and diagnostic evaluation Details: 22 G 1.5 in needle, lateral approach  Arthrogram: No  Medications: 3 mL lidocaine 1 %; 40 mg methylPREDNISolone acetate 40 MG/ML Outcome: tolerated well, no immediate complications Procedure, treatment alternatives, risks and benefits explained, specific risks discussed. Consent was given by the patient. Immediately prior to procedure a time out was called to verify the correct patient, procedure, equipment, support staff and site/side marked as required. Patient was prepped and draped in the usual sterile fashion.    Large Joint Inj: L greater trochanter on 09/28/2021 3:38 PM Indications: pain and diagnostic evaluation Details: 22 G 1.5 in needle, lateral approach  Arthrogram: No  Medications: 3 mL lidocaine 1 %; 40 mg methylPREDNISolone acetate 40 MG/ML Outcome: tolerated well, no immediate complications Procedure, treatment alternatives, risks and benefits  explained, specific risks discussed. Consent was given by the patient. Immediately prior to procedure a time out was called to verify the correct patient, procedure, equipment, support staff and site/side marked as required. Patient was prepped and draped in the usual sterile fashion.       Clinical Data: No additional findings.   Subjective: Chief Complaint  Patient presents with   Left Hip - Pain   Right Hip - Pain  The patient continues to deal with chronic pain on the lateral aspect of both of her hips.  She also has chronic low back pain.  She is requesting steroid injections of the trochanteric area of both hips.  She is a diabetic but has excellent control.  I had seen her before to consider [facility] but she was unable to go since the daughter broke her foot.  She is interested in trying that again to see if this would help any modalities to try to treat her chronic pain.  HPI  Review of Systems There is currently listed no fever, chills, nausea, vomiting  Objective: Vital Signs: There were no vitals taken for this visit.  Physical Exam She is alert and orient x3 and in no acute distress Ortho Exam Examination of both hips with her sitting show the move smoothly and fluidly with no pain in the groin at all.  She has significant pain to barely touching the trochanteric area of both hips. Specialty Comments:  No specialty comments available.  Imaging:  No results found.   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   Unilateral primary osteoarthritis, right hip 01/27/2017   Past Medical History:  Diagnosis Date   Arthritis    patient denies   Asthma    Diabetes mellitus without complication ([facility])    type     GERD (gastroesophageal reflux disease)    Hypertension    Interstitial cystitis    IUD (intrauterine device) in place placed 6 weeks ago   Migraines    MIgraines- not current     Pneumonia     Family History  Problem Relation Age of Onset   Diabetes Mother    Heart disease Mother    Diabetes Father    Eczema Sister    Asthma Brother    Asthma Son    Eczema Daughter    Allergic rhinitis Neg Hx    Angioedema Neg Hx    Immunodeficiency Neg Hx    Urticaria Neg Hx     Past Surgical History:  Procedure Laterality Date   ANTERIOR HIP REVISION Right 05/20/2020   Procedure: RIGHT TOTAL HIP REVISION- ploy exhange and hip ball exchange with bone graft;  Surgeon: Kathryne Hitch, MD;  Location: [facility];  Service: Orthopedics;  Laterality: Right;   DILATION AND EVACUATION  09/30/2011   Procedure: DILATATION AND EVACUATION;  Surgeon: Tranisha Andrea, MD;  Location: [facility];  Service: Gynecology;  Laterality: N/A;   left knee surgery      arthroscopy    TOTAL HIP ARTHROPLASTY Right 03/11/2017   Procedure: RIGHT TOTAL HIP ARTHROPLASTY ANTERIOR APPROACH;  Surgeon: Kathryne Hitch, MD;  Location: [facility];  Service: Orthopedics;  Laterality: Right;   Social History   Occupational History   Not on file  Tobacco Use   Smoking status: Never   Smokeless tobacco: Never  Vaping Use   Vaping Use: Never used  Substance and Sexual Activity   Alcohol use: No   Drug use: No   Sexual activity: Not on file

## 2021-11-25 ENCOUNTER — Other Ambulatory Visit: Payer: Self-pay | Admitting: Orthopaedic Surgery

## 2021-11-25 MED ORDER — HYDROCODONE-ACETAMINOPHEN 7.5-325 MG PO TABS: 1.0000 | ORAL_TABLET | Freq: Three times a day (TID) | ORAL | 0 refills | Status: DC | PRN

## 2021-12-25 ENCOUNTER — Encounter: Payer: Self-pay | Admitting: Allergy

## 2021-12-25 ENCOUNTER — Ambulatory Visit (INDEPENDENT_AMBULATORY_CARE_PROVIDER_SITE_OTHER): Payer: Managed Care, Other (non HMO) | Admitting: Allergy

## 2021-12-25 VITALS — BP 130/90 | HR 93 | Temp 98.6°F | Resp 18 | Ht 65.0 in | Wt 271.4 lb

## 2021-12-25 DIAGNOSIS — T7800XD Anaphylactic reaction due to unspecified food, subsequent encounter: Secondary | ICD-10-CM

## 2021-12-25 DIAGNOSIS — J4520 Mild intermittent asthma, uncomplicated: Secondary | ICD-10-CM | POA: Diagnosis not present

## 2021-12-25 DIAGNOSIS — J3089 Other allergic rhinitis: Secondary | ICD-10-CM

## 2021-12-25 DIAGNOSIS — H1013 Acute atopic conjunctivitis, bilateral: Secondary | ICD-10-CM | POA: Diagnosis not present

## 2021-12-25 MED ORDER — MONTELUKAST SODIUM 10 MG PO TABS: 10.0000 mg | ORAL_TABLET | Freq: Every day | ORAL | 1 refills | Status: DC

## 2021-12-25 MED ORDER — IPRATROPIUM BROMIDE 0.06 % NA SOLN: NASAL | 1 refills | Status: DC

## 2021-12-25 MED ORDER — BUDESONIDE-FORMOTEROL FUMARATE 160-4.5 MCG/ACT IN AERO
2.0000 | INHALATION_SPRAY | Freq: Two times a day (BID) | RESPIRATORY_TRACT | 5 refills | Status: DC
Start: 2021-12-25 — End: 2022-06-25

## 2021-12-25 MED ORDER — CETIRIZINE HCL 10 MG PO TABS: 10.0000 mg | ORAL_TABLET | Freq: Every day | ORAL | 1 refills | Status: DC

## 2021-12-25 NOTE — Patient Instructions (Signed)
Asthma -Continue Symbicort 13mcg 2 puffs twice a day with spacer to help prevent cough and wheeze.   - Continue Singulair (montelukast) 10mg  nightly - have access to albuterol inhaler 2 puffs every 4-6 hours as needed for cough/wheeze/shortness of breath/chest tightness.  May use 15-20 minutes prior to activity.   Monitor frequency of use.  - recommend use of your albuterol prior to outdoor activity, grilling etc - continue to do your best to avoid fragrance products Asthma control goals:  Full participation in all desired activities (may need albuterol before activity) Albuterol use two time or less a week on average (not counting use with activity) Cough interfering with sleep two time or less a month Oral steroids no more than once a year No hospitalizations  Allergic Rhinitis with conjunctivitis - continue allergen avoidance measures - continue Zyrtec (Cetirizine) 10 mg daily  - continue Singulair (montelukast) nightly - for nasal drainage/post-nasal drip use nasal Atrovent 0.06% 2 sprays each nostril up to 3-4 times a day as needed - use over-the-counter Pataday Xtra Strength, Pataday 1 drop each eye daily or Zaditor or Alaway 1 drop each eye twice a day as needed for itchy/watery/red eyes.   These are all over-the-counter allergy eyedrop you can use.   Food allergy Continue to avoid peanuts and tree nuts after not passing tree nut challenge. In case of an allergic reaction, give Benadryl 4 teaspoonfuls every 4 hours, and if life-threatening symptoms occur, inject with EpiPen 0.3 mg  Follow-up in 6 months or sooner if needed

## 2021-12-25 NOTE — Progress Notes (Signed)
Follow-up Note  RE: Emily Hester MRN: [id number] DOB: Oct 07, 1970 Date of Office Visit: 12/25/2021   History of present illness: Emily Hester is a 51 y.o. female presenting today for follow-up of asthma, allergic rhinitis with conjunctivitis and food allergy.  She was last seen in the office on 07/12/2021 by myself.  She has had URI for past 2 weeks with nasal congestoin, drainage, headache, sinus pressure, cough.   She went to UC around day 2 or 3 of symptoms and did receive tessalon perls (that she was taking 3 times a day) and promethazine cough syrup.  Symptoms have improved since she just has a lingering cough at this point.  She has needed to use more albuterol with this illness.  She continues to take her Symbicort 160 mcg 2 puffs twice a day as well as Singulair.  She did not get any systemic steroids nor other course this is the last visit. She states she started using atrovent nasal spray a week ago with her symptoms and does feel like it has been helpful since she has been taking it. She states she is not sure why she did not start taking it sooner than last week.  She also continues to take her Zyrtec daily for allergies and control.  She still feels Zyrtec is effective for her at this time.  She has not needed to use any eyedrops for itchy watery eyes at this time. She still avoids peanuts and tree nuts without any accidental ingestions or need for epinephrine.  Review of systems: Review of Systems  Constitutional:  Positive for fatigue.  HENT:  Positive for congestion, sinus pressure and sore throat.   Eyes: Negative.   Respiratory:  Positive for cough.   Cardiovascular: Negative.   Gastrointestinal: Negative.   Musculoskeletal: Negative.   Skin: Negative.   Allergic/Immunologic: Negative.   Neurological: Negative.      All other systems negative unless noted above in HPI  Past medical/social/surgical/family history have been reviewed and are unchanged  unless specifically indicated below.  No changes  Medication List: Current Outpatient Medications  Medication Sig Dispense Refill   acetaminophen (TYLENOL) 500 MG tablet Take 1,000 mg by mouth every 6 (six) hours as needed for moderate pain.     albuterol (PROVENTIL) (2.5 MG/3ML) 0.083% nebulizer solution Take 3 mLs (2.5 mg total) by nebulization every 4 (four) hours as needed for wheezing or shortness of breath. 75 mL 1   albuterol (VENTOLIN HFA) 108 (90 Base) MCG/ACT inhaler Inhale 1 Puff/kg into the lungs as needed.     amLODipine (NORVASC) 10 MG tablet Take 10 mg by mouth every morning.     aspirin-acetaminophen-caffeine (EXCEDRIN MIGRAINE) 250-250-65 MG tablet Take 2 tablets by mouth every 6 (six) hours as needed for headache.     Cholecalciferol (VITAMIN D) 50 MCG (2000 UT) tablet Take 2,000 Units by mouth daily.     clindamycin (CLEOCIN) 150 MG capsule Take 600 mg by mouth See admin instructions. Take 600 mg 1 hour prior to dental work     diphenhydrAMINE (BENADRYL) 25 MG tablet Take 25 mg by mouth daily as needed for allergies.     EPINEPHrine 0.3 mg/0.3 mL IJ SOAJ injection Inject 0.3 mg into the muscle as directed.     gabapentin (NEURONTIN) 100 MG capsule Take 1 capsule (100 mg total) by mouth at bedtime. 30 capsule 0   HYDROcodone-acetaminophen (NORCO) 7.5-325 MG tablet Take 1 tablet by mouth 3 (three) times daily as needed  for moderate pain. 30 tablet 0   ibuprofen (ADVIL) 200 MG tablet Take 600 mg by mouth every 6 (six) hours as needed for headache or moderate pain.     levonorgestrel (MIRENA) 20 MCG/24HR IUD 1 each by Intrauterine route once.     lidocaine (LIDODERM) 5 % Place 1 patch onto the skin daily as needed (pain). Remove & Discard patch within 12 hours or as directed by MD 30 patch 3   losartan (COZAAR) 25 MG tablet Take 25 mg by mouth daily.  0   metFORMIN (GLUCOPHAGE-XR) 750 MG 24 hr tablet Take 750 mg by mouth 2 (two) times daily.     omeprazole (PRILOSEC) 20 MG  capsule Take 1 capsule (20 mg total) by mouth every morning. (Patient taking differently: Take 20 mg by mouth daily.) 30 capsule 5   ondansetron (ZOFRAN) 8 MG tablet Take 8 mg by mouth every 8 (eight) hours as needed for nausea or vomiting.     ONETOUCH VERIO test strip as directed.   3   rosuvastatin (CRESTOR) 10 MG tablet Take 10 mg by mouth daily.  3   Semaglutide, 2 MG/DOSE, (OZEMPIC, 2 MG/DOSE,) 8 MG/3ML SOPN Inject 2 mg into the skin once a week.     Spacer/Aero-Holding Chambers DEVI Take 1 each by mouth as directed. 1 each 0   tiZANidine (ZANAFLEX) 4 MG tablet TAKE 1 TABLET BY MOUTH EVERY 6 HOURS AS NEEDED FOR MUSCLE SPASMS. 360 tablet 1   traMADol (ULTRAM) 50 MG tablet Take 1 tablet by mouth as needed.     budesonide-formoterol (SYMBICORT) 160-4.5 MCG/ACT inhaler Inhale 2 puffs into the lungs 2 (two) times daily. 1 each 5   cetirizine (ZYRTEC) 10 MG tablet Take 1 tablet (10 mg total) by mouth daily. 90 tablet 1   ipratropium (ATROVENT) 0.06 % nasal spray Apply 2 sprays each nostril up to 3-4 times a day as needed 45 mL 1   montelukast (SINGULAIR) 10 MG tablet Take 1 tablet (10 mg total) by mouth at bedtime. 90 tablet 1   No current facility-administered medications for this visit.     Known medication allergies: Allergies  Allergen Reactions   Amoxicillin Hives, Swelling and Other (See Comments)    Has patient had a PCN reaction causing immediate rash, facial/tongue/throat swelling, SOB or lightheadedness with hypotension: Yes Has patient had a PCN reaction causing severe rash involving mucus membranes or skin necrosis: No Has patient had a PCN reaction that required hospitalization: Yes Has patient had a PCN reaction occurring within the last 10 years: No  If all of the above answers are "NO", then may proceed with Cephalosporin use.    Lisinopril Itching and Cough    Heart racing    Olmesartan     jittery   Bactrim Rash     Physical examination: Blood pressure (!) 130/90,  pulse 93, temperature 98.6 F (37 C), resp. rate 18, height 5\' 5"  (1.651 m), weight 271 lb 6.4 oz (123.1 kg), SpO2 97 %.  General: Alert, interactive, in no acute distress. HEENT: PERRLA, TMs pearly gray, turbinates moderately edematous without discharge, post-pharynx non erythematous. Neck: Supple without lymphadenopathy. Lungs: Clear to auscultation without wheezing, rhonchi or rales. {no increased work of breathing. CV: Normal S1, S2 without murmurs. Abdomen: Nondistended, nontender. Skin: Warm and dry, without lesions or rashes. Extremities:  No clubbing, cyanosis or edema. Neuro:   Grossly intact.  Diagnositics/Labs:  Spirometry: FEV1: 1.86L 79%, FVC: 2.31L 79%, ratio consistent with nonobstructive pattern  Assessment  and plan:   Asthma with a recent URI -Continue Symbicort 2 puffs twice a day with spacer to help prevent cough and wheeze.   - Continue Singulair (montelukast) 10mg  nightly - have access to albuterol inhaler 2 puffs every 4-6 hours as needed for cough/wheeze/shortness of breath/chest tightness.  May use 15-20 minutes prior to activity.   Monitor frequency of use.  - recommend use of your albuterol prior to outdoor activity, grilling etc - continue to do your best to avoid fragrance products Asthma control goals:  Full participation in all desired activities (may need albuterol before activity) Albuterol use two time or less a week on average (not counting use with activity) Cough interfering with sleep two time or less a month Oral steroids no more than once a year No hospitalizations  Allergic Rhinitis with conjunctivitis - continue allergen avoidance measures - continue Zyrtec (Cetirizine) 10 mg daily  - continue Singulair (montelukast) nightly - for nasal drainage/post-nasal drip use nasal Atrovent 0.06% 2 sprays each nostril up to 3-4 times a day as needed - use over-the-counter Pataday Xtra Strength, Pataday 1 drop each eye daily or Zaditor or  Alaway 1 drop each eye twice a day as needed for itchy/watery/red eyes.   These are all over-the-counter allergy eyedrop you can use.   Food allergy Continue to avoid peanuts and tree nuts after not passing tree nut challenge. In case of an allergic reaction, give Benadryl 4 teaspoonfuls every 4 hours, and if life-threatening symptoms occur, inject with EpiPen 0.3 mg  Follow-up in 6 months or sooner if needed  I appreciate the opportunity to take part in Trixy's care. Please do not hesitate to contact me with questions.  Sincerely,   , MD Allergy/Immunology [facility]

## 2022-01-18 ENCOUNTER — Ambulatory Visit (INDEPENDENT_AMBULATORY_CARE_PROVIDER_SITE_OTHER): Payer: Managed Care, Other (non HMO) | Admitting: Physician Assistant

## 2022-01-18 ENCOUNTER — Encounter: Payer: Self-pay | Admitting: Physician Assistant

## 2022-01-18 DIAGNOSIS — M7062 Trochanteric bursitis, left hip: Secondary | ICD-10-CM

## 2022-01-18 DIAGNOSIS — M7061 Trochanteric bursitis, right hip: Secondary | ICD-10-CM

## 2022-01-18 MED ORDER — HYDROCODONE-ACETAMINOPHEN 7.5-325 MG PO TABS: 1.0000 | ORAL_TABLET | Freq: Three times a day (TID) | ORAL | 0 refills | Status: DC | PRN

## 2022-01-18 MED ORDER — LIDOCAINE 5 % EX PTCH: 1.0000 | MEDICATED_PATCH | Freq: Every day | CUTANEOUS | 3 refills | Status: DC | PRN

## 2022-01-18 MED ORDER — LIDOCAINE HCL 1 % IJ SOLN
3.0000 mL | INTRAMUSCULAR | Status: AC | PRN
  Administered 2022-01-18: 3 mL

## 2022-01-18 MED ORDER — METHYLPREDNISOLONE ACETATE 40 MG/ML IJ SUSP
40.0000 mg | INTRAMUSCULAR | Status: AC | PRN
  Administered 2022-01-18: 40 mg via INTRA_ARTICULAR

## 2022-01-18 NOTE — Progress Notes (Signed)
   Procedure Note  [name]             Date of Birth: Sep 02, 1970           MRN: [id number]             Visit Date: 01/18/2022 HPI: [name] comes in today due to bilateral hip pain.  States her left hip is worse than the right.  States the injections on 09/28/2021 helped for few weeks.  She has had no new injury.  She has chronic back pain and takes hydrocodone asking for refill.  She has been unable to get to physical therapy due to the fact that her husband is having surgery and her daughter still dealing with her foot fracture and apparently has to undergo some surgery on the foot.  Review of systems: Denies any fevers or chills.  Physical exam: General well-developed well-nourished female no acute distress mood and affect appropriate.  Ambulates without any assistive device. Bilateral hips: Good range of motion of both hips without pain.  Tenderness over the trochanteric region bilateral hips.   Procedures: Visit Diagnoses:  1. Trochanteric bursitis, right hip   2. Trochanteric bursitis, left hip     Large Joint Inj: bilateral greater trochanter on 01/18/2022 2:05 PM Indications: pain Details: 22 G 1.5 in needle, lateral approach  Arthrogram: No  Medications (Right): 3 mL lidocaine 1 %; 40 mg methylPREDNISolone acetate 40 MG/ML Medications (Left): 3 mL lidocaine 1 %; 40 mg methylPREDNISolone acetate 40 MG/ML Outcome: tolerated well, no immediate complications Procedure, treatment alternatives, risks and benefits explained, specific risks discussed. Consent was given by the patient. Immediately prior to procedure a time out was called to verify the correct patient, procedure, equipment, support staff and site/side marked as required. Patient was prepped and draped in the usual sterile fashion.      Plan: She shown IT band stretching exercises.  She will follow-up with [name] as needed pain persist or becomes worse.  Questions were encouraged and answered

## 2022-03-25 ENCOUNTER — Encounter: Payer: Self-pay | Admitting: Physical Medicine and Rehabilitation

## 2022-03-25 ENCOUNTER — Encounter: Payer: Self-pay | Admitting: Orthopaedic Surgery

## 2022-03-29 ENCOUNTER — Other Ambulatory Visit: Payer: Self-pay

## 2022-03-29 DIAGNOSIS — M7062 Trochanteric bursitis, left hip: Secondary | ICD-10-CM

## 2022-03-29 DIAGNOSIS — M7061 Trochanteric bursitis, right hip: Secondary | ICD-10-CM

## 2022-03-30 ENCOUNTER — Other Ambulatory Visit: Payer: Self-pay | Admitting: Orthopaedic Surgery

## 2022-03-30 MED ORDER — HYDROCODONE-ACETAMINOPHEN 7.5-325 MG PO TABS: 1.0000 | ORAL_TABLET | Freq: Three times a day (TID) | ORAL | 0 refills | Status: DC | PRN

## 2022-04-01 ENCOUNTER — Encounter: Payer: Self-pay | Admitting: Physical Medicine and Rehabilitation

## 2022-04-01 ENCOUNTER — Ambulatory Visit (INDEPENDENT_AMBULATORY_CARE_PROVIDER_SITE_OTHER): Payer: Managed Care, Other (non HMO) | Admitting: Physical Medicine and Rehabilitation

## 2022-04-01 VITALS — BP 138/86 | HR 98

## 2022-04-01 DIAGNOSIS — M47819 Spondylosis without myelopathy or radiculopathy, site unspecified: Secondary | ICD-10-CM

## 2022-04-01 DIAGNOSIS — M47816 Spondylosis without myelopathy or radiculopathy, lumbar region: Secondary | ICD-10-CM | POA: Diagnosis not present

## 2022-04-01 DIAGNOSIS — Z96641 Presence of right artificial hip joint: Secondary | ICD-10-CM | POA: Diagnosis not present

## 2022-04-01 DIAGNOSIS — M545 Low back pain, unspecified: Secondary | ICD-10-CM

## 2022-04-01 DIAGNOSIS — G8929 Other chronic pain: Secondary | ICD-10-CM

## 2022-04-01 NOTE — Progress Notes (Signed)
Emily Hester - 52 y.o. female MRN [id number]  Date of birth: 18-Mar-1970  Office Visit Note: Visit Date: 04/01/2022 PCP: Merri Brunette, MD Referred by: Merri Brunette, MD  Subjective: Chief Complaint  Patient presents with   Lower Back - Pain   HPI: Emily Hester is a 52 y.o. female who comes in today for evaluation of chronic, worsening and severe bilateral lower back pain with intermittent radiation of pain down anterolateral legs. Lower back pain is most severe. Pain ongoing for several years and worsens with sitting, movement and activity. She describes her pain as sore and aching, currently rates as 9 out of 10. She has tried home exercise regimen, rest and medications with minimal relief of pain. Has attended formal physical therapy in the past with no relief of pain. She does take Norco for moderate/severe pain prescribed by Dr. [name]. Lumbar MRI imaging from 2022 exhibits mild degenerative change at L1-L2, L4-L5, and L5-S1, facet arthropathy more prominent bilaterally at level of L4-L5. No stenosis/nerve impingement noted. Patient has undergone multiple injections in our office over the years including lumbar epidural steroid injections, right intra-articular hip injection and right greater trochanter injection. She reports intermittent relief of pain with lumbar injections. Most recent was bilateral L5 transforaminal epidural steroid injection on 05/28/2021, she reports no relief of pain with this procedure. From an orthopedic standpoint she continues to be managed by Dr. Magnus Ivan, did undergo bilateral greater trochanter injections in November that provided some short term relief of pain. History of right total hip arthroplasty and revision with Dr. Magnus Ivan. Patient states Dr. Magnus Ivan did place order for formal physical therapy with our in house team, plans on starting therapy on Monday. Patient denies focal weakness, numbness and tingling. Denies recent trauma  or falls.    Review of Systems  Musculoskeletal:  Positive for back pain and myalgias.  Neurological:  Negative for tingling, sensory change, focal weakness and weakness.  All other systems reviewed and are negative.  Otherwise per HPI.  Assessment & Plan: Visit Diagnoses:    ICD-10-CM   1. Chronic bilateral low back pain without sciatica  M54.50 Ambulatory referral to Physical Medicine Rehab   G89.29     2. Spondylosis without myelopathy or radiculopathy  M47.819 Ambulatory referral to Physical Medicine Rehab    3. Facet hypertrophy of lumbar region  M47.816 Ambulatory referral to Physical Medicine Rehab    4. Status post total replacement of right hip  Z96.641 Ambulatory referral to Physical Medicine Rehab       Plan: Findings:  1.Chronic, worsening and severe bilateral lower back pain, intermittent radiation to bilateral anterolateral legs. Bilateral lower back pain is most severe. Patient continues to have severe pain despite good conservative therapy such as formal physical therapy, home exercise regimen, rest and use of medications.  Patient's clinical presentation and exam are consistent with facet mediated pain.  She does have severe pain with lumbar extension upon exam today.  Next step is to use perform diagnostic bilateral L4-L5 medial branch blocks under fluoroscopic guidance.  If good relief of pain with diagnostic blocks we did discuss the possibility of longer sustained relief of pain with radiofrequency ablation procedure. I did discuss medication management with her, I do feel she would benefit from chronic pain management if she continues to require opioid medications. No red flag symptoms noted upon exam today.   2. Intermittent relief with previous lumbar epidural steroid injections, no relief of pain with most recent injection in March  2023.  At this point, we would not recommend repeating lumbar epidural steroid injections as she does not seem to be getting lasting pain  relief with these procedures. Lumbar MRI imaging from 2022 do not correlate with radicular symptoms.     Meds & Orders: No orders of the defined types were placed in this encounter.   Orders Placed This Encounter  Procedures   Ambulatory referral to Physical Medicine Rehab    Follow-up: Return for Bilateral L4-L5 medial branch blocks.   Procedures: No procedures performed      Clinical History: MRI LUMBAR SPINE WITHOUT CONTRAST    TECHNIQUE:  Multiplanar, multisequence MR imaging of the lumbar spine was  performed. No intravenous contrast was administered.    COMPARISON:  MRI lumbar spine February 12, 2020.    FINDINGS:  Segmentation: Standard.    Alignment:  Normal.    Vertebrae: No focal marrow edema to suggest acute fracture or  discitis/osteomyelitis. No suspicious bone lesions.    Conus medullaris and cauda equina: Conus extends to the L1 level.  Conus and cauda equina appear normal.    Paraspinal and other soft tissues: Unremarkable.    Disc levels:    T12-L1: No significant disc protrusion, foraminal stenosis, or canal  stenosis.    L1-L2: Similar mild disc desiccation height loss. Similar mild disc  bulge without significant stenosis.    L2-L3: No significant disc protrusion, foraminal stenosis, or canal  stenosis.    L3-L4: No significant disc protrusion, foraminal stenosis, or canal  stenosis.    L4-L5: Similar slight disc bulging and bilateral facet arthropathy  with ligamentum flavum thickening. No significant canal or foraminal  stenosis.    L5-S1: Similar mild disc bulge with annular fissure. No significant  stenosis.    IMPRESSION:  Similar mild degenerative change at L1-L2, L4-L5, and L5-S1  (detailed above) without significant stenosis.      Electronically Signed    By: Margaretha Sheffield M.D.    On: 12/28/2020 14:42   She reports that she has never smoked. She has never used smokeless tobacco. No results for input(s): "HGBA1C",  "LABURIC" in the last 8760 hours.  Objective:  VS:  HT:    WT:   BMI:     BP:138/86  HR:98bpm  TEMP: ( )  RESP:  Physical Exam Vitals and nursing note reviewed.  HENT:     Head: Normocephalic and atraumatic.     Right Ear: External ear normal.     Left Ear: External ear normal.     Nose: Nose normal.     Mouth/Throat:     Mouth: Mucous membranes are moist.  Eyes:     Extraocular Movements: Extraocular movements intact.  Cardiovascular:     Rate and Rhythm: Normal rate.     Pulses: Normal pulses.  Pulmonary:     Effort: Pulmonary effort is normal.  Abdominal:     General: Abdomen is flat. There is no distension.  Musculoskeletal:        General: Tenderness present.     Cervical back: Normal range of motion.     Comments: Pt rises from seated position to standing without difficulty. Concordant low back pain with facet loading, lumbar spine extension and rotation. Strong distal strength without clonus, tenderness upon palpation of bilateral greater trochanters. Sensation intact bilaterally. Walks independently, gait steady.     Skin:    General: Skin is warm and dry.     Capillary Refill: Capillary refill takes less than 2 seconds.  Neurological:     General: No focal deficit present.     Mental Status: She is alert and oriented to person, place, and time.  Psychiatric:        Mood and Affect: Mood normal.        Behavior: Behavior normal.     Ortho Exam  Imaging: No results found.  Past Medical/Family/Surgical/Social History: Medications & Allergies reviewed per EMR, new medications updated. 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[place]) 05/20/2020   Status post revision of total hip 05/20/2020   Trochanteric bursitis, right hip 09/05/2019   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   Unilateral primary osteoarthritis, right hip 01/27/2017   Past Medical History:  Diagnosis Date   Arthritis     patient denies   Asthma    Diabetes mellitus without complication ([place])    type    GERD (gastroesophageal reflux disease)    Hypertension    Interstitial cystitis    IUD (intrauterine device) in place placed 6 weeks ago   Migraines    MIgraines- not current     Pneumonia    Family History  Problem Relation Age of Onset   Diabetes Mother    Heart disease Mother    Diabetes Father    Eczema Sister    Asthma Brother    Asthma Son    Eczema Daughter    Allergic rhinitis Neg Hx    Angioedema Neg Hx    Immunodeficiency Neg Hx    Urticaria Neg Hx    Past Surgical History:  Procedure Laterality Date   ANTERIOR HIP REVISION Right 05/20/2020   Procedure: RIGHT TOTAL HIP REVISION- ploy exhange and hip ball exchange with bone graft;  Surgeon: Mcarthur Rossetti, MD;  Location: [facility];  Service: Orthopedics;  Laterality: Right;   DILATION AND EVACUATION  09/30/2011   Procedure: DILATATION AND EVACUATION;  Surgeon: Allena Katz, MD;  Location: [facility];  Service: Gynecology;  Laterality: N/A;   left knee surgery      arthroscopy    TOTAL HIP ARTHROPLASTY Right 03/11/2017   Procedure: RIGHT TOTAL HIP ARTHROPLASTY ANTERIOR APPROACH;  Surgeon: Mcarthur Rossetti, MD;  Location: [facility];  Service: Orthopedics;  Laterality: Right;   Social History   Occupational History   Not on file  Tobacco Use   Smoking status: Never   Smokeless tobacco: Never  Vaping Use   Vaping Use: Never used  Substance and Sexual Activity   Alcohol use: No   Drug use: No   Sexual activity: Not on file

## 2022-04-01 NOTE — Progress Notes (Signed)
  Functional Pain Scale - descriptive words and definitions  Unmanageable (7)  Pain interferes with normal ADL's/nothing seems to help/sleep is very difficult/active distractions are very difficult to concentrate on. Severe range order  Average Pain 7 - 8   +Driver, -BT, -Dye Allergies.   Pain in lower back x years. Changes from side-to-side. For 1 week now, has had pain on the right side, with radiating pain down the lateral thigh around to anterior lower leg, with tingling in the foot. NKI

## 2022-04-02 NOTE — Telephone Encounter (Signed)
Ok anything needed from me? Do I need to put in order for inj?

## 2022-04-05 ENCOUNTER — Other Ambulatory Visit: Payer: Self-pay

## 2022-04-05 ENCOUNTER — Ambulatory Visit (INDEPENDENT_AMBULATORY_CARE_PROVIDER_SITE_OTHER): Payer: Managed Care, Other (non HMO) | Admitting: Physical Therapy

## 2022-04-05 ENCOUNTER — Encounter: Payer: Self-pay | Admitting: Physical Therapy

## 2022-04-05 DIAGNOSIS — M25551 Pain in right hip: Secondary | ICD-10-CM

## 2022-04-05 DIAGNOSIS — M6281 Muscle weakness (generalized): Secondary | ICD-10-CM

## 2022-04-05 DIAGNOSIS — M25552 Pain in left hip: Secondary | ICD-10-CM

## 2022-04-05 DIAGNOSIS — R262 Difficulty in walking, not elsewhere classified: Secondary | ICD-10-CM | POA: Diagnosis not present

## 2022-04-05 DIAGNOSIS — M546 Pain in thoracic spine: Secondary | ICD-10-CM | POA: Diagnosis not present

## 2022-04-05 DIAGNOSIS — M5459 Other low back pain: Secondary | ICD-10-CM

## 2022-04-05 NOTE — Therapy (Signed)
OUTPATIENT PHYSICAL THERAPY THORACOLUMBAR EVALUATION   Patient Name: Emily Hester MRN: [id number] DOB:1971-02-12, 52 y.o., female Today's Date: 04/05/2022  END OF SESSION:  PT End of Session - 04/05/22 1014     Visit Number 1    Number of Visits 6    Date for PT Re-Evaluation 05/17/22    Authorization Type [employer] and Medicare    Progress Note Due on Visit 10    PT Start Time 0930    PT Stop Time 1005    PT Time Calculation (min) 35 min    Activity Tolerance Patient tolerated treatment well    Behavior During Therapy WFL for tasks assessed/performed             Past Medical History:  Diagnosis Date   Arthritis    patient denies   Asthma    Diabetes mellitus without complication ([place])    type    GERD (gastroesophageal reflux disease)    Hypertension    Interstitial cystitis    IUD (intrauterine device) in place placed 6 weeks ago   Migraines    MIgraines- not current     Pneumonia    Past Surgical History:  Procedure Laterality Date   ANTERIOR HIP REVISION Right 05/20/2020   Procedure: RIGHT TOTAL HIP REVISION- ploy exhange and hip ball exchange with bone graft;  Surgeon: Mcarthur Rossetti, MD;  Location: ;  Service: Orthopedics;  Laterality: Right;   DILATION AND EVACUATION  09/30/2011   Procedure: DILATATION AND EVACUATION;  Surgeon: Allena Katz, MD;  Location: [facility];  Service: Gynecology;  Laterality: N/A;   left knee surgery      arthroscopy    TOTAL HIP ARTHROPLASTY Right 03/11/2017   Procedure: RIGHT TOTAL HIP ARTHROPLASTY ANTERIOR APPROACH;  Surgeon: Mcarthur Rossetti, MD;  Location: [facility];  Service: Orthopedics;  Laterality: Right;   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[place]) 05/20/2020   Status post revision of total hip 05/20/2020   Trochanteric bursitis, right hip 09/05/2019   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   Unilateral primary osteoarthritis,  right hip 01/27/2017    PCP: Carol Ada, MD  REFERRING PROVIDER: Mcarthur Rossetti, MD  REFERRING DIAG: M70.61 (ICD-10-CM) - Trochanteric bursitis, right hip M70.62 (ICD-10-CM) - Trochanteric bursitis, left hip  Rationale for Evaluation and Treatment: Rehabilitation  THERAPY DIAG:  Other low back pain - Plan: PT plan of care cert/re-cert  Pain in thoracic spine - Plan: PT plan of care cert/re-cert  Muscle weakness (generalized) - Plan: PT plan of care cert/re-cert  Difficulty in walking, not elsewhere classified - Plan: PT plan of care cert/re-cert  Pain in right hip - Plan: PT plan of care cert/re-cert  Pain in left hip - Plan: PT plan of care cert/re-cert  ONSET DATE: Chronic  SUBJECTIVE:  SUBJECTIVE STATEMENT: Pt reports chronic back pain for several years, but unsure of cause.  She also has bil hip pain with dx of trochanteric bursitis bil which is chronic.    PERTINENT HISTORY:  Arthritis, asthma, DM, HTN, interstitial cystitis, migraines, hx PNA, Rt THA with revision  PAIN:  Are you having pain? Yes: NPRS scale: 7 currently, up to 9, at best 6/10 Pain location: low back and bil hips, radiates to Rt foot Pain description: reports tingling in ant Rt shin and foot, burning and stinging in the back, aching and throbbing in hips Aggravating factors: laying on side, walking longer distances (grocery store), standing to wash dishes Relieving factors: sit and get comfortable, medication for pain  PRECAUTIONS: None  WEIGHT BEARING RESTRICTIONS: No  FALLS:  Has patient fallen in last 6 months? No  LIVING ENVIRONMENT: Lives with: lives with their family (husband and 29 y/o daughter) - helping with daughter Lives in: House/apartment Stairs: Yes: Internal: 71 (to get to basement  with laundry) steps; on right going up and External: 5 steps; on right going up; must use step to pattern Has following equipment at home: Single point cane, Walker - 2 wheeled, and Crutches  OCCUPATION: on disability  PLOF: Independent and Leisure: make jewelry  PATIENT GOALS: improve pain   NEXT MD VISIT: 04/06/22  OBJECTIVE:   DIAGNOSTIC FINDINGS:  Lumbar MRI imaging from 2022 exhibits mild degenerative change at L1-L2, L4-L5, and L5-S1, facet arthropathy more prominent bilaterally at level of L4-L5. No stenosis/nerve impingement noted.  PATIENT SURVEYS:  04/05/22: FOTO 33 (predicted 44)  SCREENING FOR RED FLAGS: Bowel or bladder incontinence: No Spinal tumors: No Cauda equina syndrome: No  COGNITION: Overall cognitive status: Within functional limits for tasks assessed     SENSATION: 04/05/22: WFL  MUSCLE LENGTH: 04/05/22: tightness in Rt hamstring and gastroc  POSTURE:  04/05/22: rounded shoulders, forward head, and increased lumbar lordosis  PALPATION: 04/05/22: very tender to bil greater trochanters; glutes  LUMBAR ROM:   AROM eval  Flexion Limited 75%  Extension Limited 25% (Rt lean)  Right Quadrant WNL with pain  Left Quadrant WNL with pain   (Blank rows = not tested)  LOWER EXTREMITY MMT:    MMT Right eval Left eval  Hip flexion 3/5 (give way weakness) 4/5  Hip extension    Hip abduction 4/5 with pain 4/5  Hip adduction    Hip internal rotation    Hip external rotation    Knee flexion 5/5 5/5  Knee extension 5/5 5/5  Ankle dorsiflexion    Ankle plantarflexion    Ankle inversion    Ankle eversion     (Blank rows = not tested)  LUMBAR SPECIAL TESTS:  04/05/22: Slump test: Negative  GAIT: 04/05/22: Distance walked: 150' within clinic Assistive device utilized: None Level of assistance: Modified independence Comments: decreased speed, antalgic gait  TODAY'S TREATMENT:  DATE: 04/05/22    PATIENT EDUCATION:  Education details: HEP Person educated: Patient Education method: Programmer, multimedia, Facilities manager, and Handouts Education comprehension: verbalized understanding, returned demonstration, and needs further education  HOME EXERCISE PROGRAM: Access Code: [email or web address] URL: [email or web address] Date: 04/05/2022 Prepared by: Moshe Cipro  Exercises - Hooklying Single Knee to Chest  - 2 x daily - 7 x weekly - 1 sets - 3 reps - 30 sec hold - Supine Piriformis Stretch with Foot on Ground  - 2 x daily - 7 x weekly - 1 sets - 3 reps - 30 sec hold - Sidelying Hip Circles  - 2 x daily - 7 x weekly - 1 sets - 10 reps - Supine Bridge  - 2 x daily - 7 x weekly - 1 sets - 10 reps - 5 sec hold  ASSESSMENT:  CLINICAL IMPRESSION: Patient is a 52 y.o. female who was seen today for physical therapy evaluation and treatment for chronic low back and bil hip pain. She demonstrates decreased strength and flexibility as well as continued pain and gait abnormalities affecting functional mobility. She will benefit from PT to address deficits listed.   OBJECTIVE IMPAIRMENTS: Abnormal gait, decreased activity tolerance, decreased endurance, decreased mobility, difficulty walking, decreased strength, increased fascial restrictions, increased muscle spasms, impaired flexibility, postural dysfunction, and pain.   ACTIVITY LIMITATIONS: carrying, lifting, bending, sitting, standing, squatting, sleeping, stairs, transfers, locomotion level, and caring for others  PARTICIPATION LIMITATIONS: meal prep, cleaning, laundry, driving, shopping, community activity, and yard work  PERSONAL FACTORS: 3+ comorbidities: Arthritis, asthma, DM, HTN, interstitial cystitis, migraines, hx PNA, Rt THA with revision  are also affecting patient's functional outcome.   REHAB POTENTIAL: Good  CLINICAL DECISION MAKING: Evolving/moderate  complexity  EVALUATION COMPLEXITY: Moderate   GOALS: Goals reviewed with patient? Yes  SHORT TERM GOALS: Target date: 04/26/2022  Independent with initial HEP Goal status: INITIAL   LONG TERM GOALS: Target date: 05/17/2022   Independent with final HEP Goal status: INITIAL  2.  FOTO score improved to 44 Goal status: INITIAL  3.  Rt hip strength improved to 4+/5 for improved function and mobility Goal status: INIITAL  4.  Report pain < 5/10 with standing and walking > 20 min for improved function Goal status: INITIAL  5.  Demonstrate improved functional strength by negotiating stairs reciprocally with single handrail Goal status: INITIAL   PLAN:  PT FREQUENCY: 1x/week  PT DURATION: 6 weeks  PLANNED INTERVENTIONS: Therapeutic exercises, Therapeutic activity, Neuromuscular re-education, Balance training, Gait training, Patient/Family education, Self Care, Joint mobilization, Stair training, DME instructions, Aquatic Therapy, Dry Needling, Electrical stimulation, Spinal manipulation, Spinal mobilization, Cryotherapy, Moist heat, Taping, Traction, Ultrasound, Ionotophoresis 4mg /ml Dexamethasone, Manual therapy, and Re-evaluation.  PLAN FOR NEXT SESSION: consider ionto to bil hip, review HEP and progress as able, manual/modalities PRN   , PT, DPT 04/05/22 10:25 AM

## 2022-04-06 ENCOUNTER — Encounter: Payer: Self-pay | Admitting: Orthopaedic Surgery

## 2022-04-06 ENCOUNTER — Ambulatory Visit: Payer: Managed Care, Other (non HMO) | Admitting: Physician Assistant

## 2022-04-06 ENCOUNTER — Ambulatory Visit (INDEPENDENT_AMBULATORY_CARE_PROVIDER_SITE_OTHER): Payer: Managed Care, Other (non HMO)

## 2022-04-06 DIAGNOSIS — M7061 Trochanteric bursitis, right hip: Secondary | ICD-10-CM

## 2022-04-06 DIAGNOSIS — G8929 Other chronic pain: Secondary | ICD-10-CM

## 2022-04-06 DIAGNOSIS — M25512 Pain in left shoulder: Secondary | ICD-10-CM

## 2022-04-06 DIAGNOSIS — M7062 Trochanteric bursitis, left hip: Secondary | ICD-10-CM | POA: Diagnosis not present

## 2022-04-06 MED ORDER — LIDOCAINE HCL 1 % IJ SOLN
3.0000 mL | INTRAMUSCULAR | Status: AC | PRN
  Administered 2022-04-06: 3 mL

## 2022-04-06 MED ORDER — METHYLPREDNISOLONE ACETATE 40 MG/ML IJ SUSP
40.0000 mg | INTRAMUSCULAR | Status: AC | PRN
  Administered 2022-04-06: 40 mg via INTRA_ARTICULAR

## 2022-04-06 NOTE — Progress Notes (Signed)
Office Visit Note   [name]           Date of Birth: 22-Nov-1970           MRN: [id number] Visit Date: 04/06/2022              Requested by: Merri Brunette, MD [phone] [place],  [place] [address] PCP: Merri Brunette, MD   Assessment & Plan: Visit Diagnoses:  1. Acute pain of left shoulder   2. Trochanteric bursitis, right hip   3. Trochanteric bursitis, left hip     Plan: Will set her up for physical therapy for her shoulder.  She is given a piece of Thera-Band today having shoulder exercises to start performing at home.  She will continue IT band stretching exercises and will continue therapy for her hip bursitis bilaterally.  Follow-up with [name] as needed.  She needs to wait at least 3 months between hip injections.  Questions were encouraged and answered at length.  Follow-Up Instructions: Return if symptoms worsen or fail to improve.   Orders:  Orders Placed This Encounter  Procedures   Large Joint Inj   XR Shoulder Left   No orders of the defined types were placed in this encounter.     Procedures: Large Joint Inj: bilateral greater trochanter on 04/06/2022 9:14 AM Indications: pain Details: 22 G 1.5 in needle, lateral approach  Arthrogram: No  Medications (Right): 3 mL lidocaine 1 %; 40 mg methylPREDNISolone acetate 40 MG/ML Medications (Left): 3 mL lidocaine 1 %; 40 mg methylPREDNISolone acetate 40 MG/ML Outcome: tolerated well, no immediate complications Procedure, treatment alternatives, risks and benefits explained, specific risks discussed. Consent was given by the patient. Immediately prior to procedure a time out was called to verify the correct patient, procedure, equipment, support staff and site/side marked as required. Patient was prepped and draped in the usual sterile fashion.       Clinical Data: No additional findings.   Subjective: Chief Complaint  Patient presents with   Left Shoulder - Pain     HPI Emily Hester comes in today requesting bilateral hip injections.  Last injections 01/18/2022 were helpful but she is having pain again.  She has had no new injury.  She is diabetic but reports her hemoglobin A1c to be 6.5 at last check.  She started physical therapy yesterday for her hips.  She is also having left shoulder pain it has been ongoing for months no known injury.  She denies any numbness tingling down the arm.  States the pain is anterior aspect shoulder.  She has pain with reaching behind her.  History of right rotator cuff repair no history of surgery on the left.  Review of Systems  Constitutional:  Negative for chills and fever.  See HPI otherwise negative or noncontributory.   Objective: Vital Signs: There were no vitals taken for this visit.  Physical Exam Constitutional:      Appearance: She is not ill-appearing or diaphoretic.  Pulmonary:     Effort: Pulmonary effort is normal.  Neurological:     Mental Status: She is alert and oriented to person, place, and time.  Psychiatric:        Mood and Affect: Mood normal.     Ortho Exam Bilateral hips good range of motion.  Tenderness over the trochanteric region bilaterally.  Right hip surgical incisions well-healed no signs of infection. Bilateral shoulders 5 out of 5 strength with external and internal rotation against  resistance.  Empty can test is negative bilaterally.  Impingement testing positive on the left negative on the right.  Adduction causes pain left shoulder in the region of the [facility] joint.  Liftoff test negative bilaterally.  Specialty Comments:  MRI LUMBAR SPINE WITHOUT CONTRAST    TECHNIQUE:  Multiplanar, multisequence MR imaging of the lumbar spine was  performed. No intravenous contrast was administered.    COMPARISON:  MRI lumbar spine February 12, 2020.    FINDINGS:  Segmentation: Standard.    Alignment:  Normal.    Vertebrae: No focal marrow edema to suggest acute fracture or   discitis/osteomyelitis. No suspicious bone lesions.    Conus medullaris and cauda equina: Conus extends to the L1 level.  Conus and cauda equina appear normal.    Paraspinal and other soft tissues: Unremarkable.    Disc levels:    T12-L1: No significant disc protrusion, foraminal stenosis, or canal  stenosis.    L1-L2: Similar mild disc desiccation height loss. Similar mild disc  bulge without significant stenosis.    L2-L3: No significant disc protrusion, foraminal stenosis, or canal  stenosis.    L3-L4: No significant disc protrusion, foraminal stenosis, or canal  stenosis.    L4-L5: Similar slight disc bulging and bilateral facet arthropathy  with ligamentum flavum thickening. No significant canal or foraminal  stenosis.    L5-S1: Similar mild disc bulge with annular fissure. No significant  stenosis.    IMPRESSION:  Similar mild degenerative change at L1-L2, L4-L5, and L5-S1  (detailed above) without significant stenosis.      Electronically Signed    By: Margaretha Sheffield M.D.    On: 12/28/2020 14:42  Imaging: XR Shoulder Left  Result Date: 04/06/2022 3 views left shoulder: Left shoulder well located.  No acute fractures or acute findings.  Moderate degenerative changes AC joint.  Glenohumeral joint well-maintained.    PMFS History: 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[place]) 05/20/2020   Status post revision of total hip 05/20/2020   Trochanteric bursitis, right hip 09/05/2019   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   Unilateral primary osteoarthritis, right hip 01/27/2017   Past Medical History:  Diagnosis Date   Arthritis    patient denies   Asthma    Diabetes mellitus without complication ([place])    type    GERD (gastroesophageal reflux disease)    Hypertension    Interstitial cystitis    IUD (intrauterine device) in place placed 6 weeks ago   Migraines    MIgraines- not current      Pneumonia     Family History  Problem Relation Age of Onset   Diabetes Mother    Heart disease Mother    Diabetes Father    Eczema Sister    Asthma Brother    Asthma Son    Eczema Daughter    Allergic rhinitis Neg Hx    Angioedema Neg Hx    Immunodeficiency Neg Hx    Urticaria Neg Hx     Past Surgical History:  Procedure Laterality Date   ANTERIOR HIP REVISION Right 05/20/2020   Procedure: RIGHT TOTAL HIP REVISION- ploy exhange and hip ball exchange with bone graft;  Surgeon: Mcarthur Rossetti, MD;  Location: [facility];  Service: Orthopedics;  Laterality: Right;   DILATION AND EVACUATION  09/30/2011   Procedure: DILATATION AND EVACUATION;  Surgeon: Allena Katz, MD;  Location: [facility];  Service: Gynecology;  Laterality: N/A;   left knee surgery      arthroscopy    TOTAL HIP ARTHROPLASTY Right 03/11/2017   Procedure: RIGHT TOTAL HIP ARTHROPLASTY ANTERIOR APPROACH;  Surgeon: Mcarthur Rossetti, MD;  Location: [facility];  Service: Orthopedics;  Laterality: Right;   Social History   Occupational History   Not on file  Tobacco Use   Smoking status: Never   Smokeless tobacco: Never  Vaping Use   Vaping Use: Never used  Substance and Sexual Activity   Alcohol use: No   Drug use: No   Sexual activity: Not on file

## 2022-04-06 NOTE — Addendum Note (Signed)
Addended by: Elvin So L on: 04/06/2022 11:10 AM   Modules accepted: Orders

## 2022-04-12 ENCOUNTER — Encounter: Payer: Self-pay | Admitting: Physical Therapy

## 2022-04-12 ENCOUNTER — Ambulatory Visit (INDEPENDENT_AMBULATORY_CARE_PROVIDER_SITE_OTHER): Payer: Managed Care, Other (non HMO) | Admitting: Physical Therapy

## 2022-04-12 DIAGNOSIS — M6281 Muscle weakness (generalized): Secondary | ICD-10-CM | POA: Diagnosis not present

## 2022-04-12 DIAGNOSIS — M25551 Pain in right hip: Secondary | ICD-10-CM

## 2022-04-12 DIAGNOSIS — M546 Pain in thoracic spine: Secondary | ICD-10-CM

## 2022-04-12 DIAGNOSIS — M5459 Other low back pain: Secondary | ICD-10-CM | POA: Diagnosis not present

## 2022-04-12 DIAGNOSIS — R262 Difficulty in walking, not elsewhere classified: Secondary | ICD-10-CM

## 2022-04-12 DIAGNOSIS — M25512 Pain in left shoulder: Secondary | ICD-10-CM

## 2022-04-12 DIAGNOSIS — M25552 Pain in left hip: Secondary | ICD-10-CM

## 2022-04-12 NOTE — Telephone Encounter (Signed)
The message from pt states her pcp told her to ask [name] for the referral to neurology.

## 2022-04-12 NOTE — Therapy (Signed)
OUTPATIENT PHYSICAL THERAPY TREATMENT NOTE RECERTIFICATION (to add shoulder to POC)   Patient Name: Emily Hester MRN: [id number] DOB:02/19/1971, 52 y.o., female Today's Date: 04/12/2022  END OF SESSION:   PT End of Session - 04/12/22 1018     Visit Number 2    Number of Visits 6    Date for PT Re-Evaluation 05/17/22    Authorization Type [employer] and Medicare    Progress Note Due on Visit 10    PT Start Time 1015    PT Stop Time 1055    PT Time Calculation (min) 40 min    Activity Tolerance Patient tolerated treatment well    Behavior During Therapy WFL for tasks assessed/performed             Past Medical History:  Diagnosis Date   Arthritis    patient denies   Asthma    Diabetes mellitus without complication ([place])    type    GERD (gastroesophageal reflux disease)    Hypertension    Interstitial cystitis    IUD (intrauterine device) in place placed 6 weeks ago   Migraines    MIgraines- not current     Pneumonia    Past Surgical History:  Procedure Laterality Date   ANTERIOR HIP REVISION Right 05/20/2020   Procedure: RIGHT TOTAL HIP REVISION- ploy exhange and hip ball exchange with bone graft;  Surgeon: Mcarthur Rossetti, MD;  Location: [facility];  Service: Orthopedics;  Laterality: Right;   DILATION AND EVACUATION  09/30/2011   Procedure: DILATATION AND EVACUATION;  Surgeon: Allena Katz, MD;  Location: [facility];  Service: Gynecology;  Laterality: N/A;   left knee surgery      arthroscopy    TOTAL HIP ARTHROPLASTY Right 03/11/2017   Procedure: RIGHT TOTAL HIP ARTHROPLASTY ANTERIOR APPROACH;  Surgeon: Mcarthur Rossetti, MD;  Location: [facility];  Service: Orthopedics;  Laterality: Right;   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[place]) 05/20/2020   Status post revision of total hip 05/20/2020   Trochanteric bursitis, right hip 09/05/2019   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    Unilateral primary osteoarthritis, right hip 01/27/2017     THERAPY DIAG:  Other low back pain - Plan: PT plan of care cert/re-cert  Pain in thoracic spine - Plan: PT plan of care cert/re-cert  Muscle weakness (generalized) - Plan: PT plan of care cert/re-cert  Difficulty in walking, not elsewhere classified - Plan: PT plan of care cert/re-cert  Pain in right hip - Plan: PT plan of care cert/re-cert  Pain in left hip - Plan: PT plan of care cert/re-cert  Acute pain of left shoulder - Plan: PT plan of care cert/re-cert   PCP: Carol Ada, MD  REFERRING PROVIDER: Mcarthur Rossetti, MD/[place], [place], [place]  REFERRING DIAG: M70.61 (ICD-10-CM) - Trochanteric bursitis, right hip M70.62 (ICD-10-CM) - Trochanteric bursitis, left hip  Rationale for Evaluation and Treatment: Rehabilitation  EVAL THERAPY DIAG:  Other low back pain - Plan: PT plan of care cert/re-cert  Pain in thoracic spine - Plan: PT plan of care cert/re-cert  Muscle weakness (generalized) - Plan: PT plan of care cert/re-cert  Difficulty in walking, not elsewhere classified - Plan: PT plan of care cert/re-cert  Pain in right hip - Plan: PT plan of care cert/re-cert  Pain in left hip - Plan: PT plan of care cert/re-cert  Acute pain of left shoulder - Plan: PT plan  of care cert/re-cert  ONSET DATE: Chronic  SUBJECTIVE:                                                                                                                                                                                           SUBJECTIVE STATEMENT: Injections seemed to help her hips; back pain is still present.  Lt shoulder is painful as well.  New referral for Lt shoulder pain in chart  PERTINENT HISTORY:  Arthritis, asthma, DM, HTN, interstitial cystitis, migraines, hx PNA, Rt THA with revision  PAIN:  Are you having pain? Yes: NPRS scale: 6 currently, up to 9, at best 6/10 Pain location: low back and bil hips, radiates to Rt  foot Pain description: reports tingling in ant Rt shin and foot, burning and stinging in the back, aching and throbbing in hips Aggravating factors: laying on side, walking longer distances (grocery store), standing to wash dishes Relieving factors: sit and get comfortable, medication for pain  PRECAUTIONS: None  WEIGHT BEARING RESTRICTIONS: No  FALLS:  Has patient fallen in last 6 months? No  LIVING ENVIRONMENT: Lives with: lives with their family (husband and 110 y/o daughter) - helping with daughter Lives in: House/apartment Stairs: Yes: Internal: 55 (to get to basement with laundry) steps; on right going up and External: 5 steps; on right going up; must use step to pattern Has following equipment at home: Single point cane, Walker - 2 wheeled, and Crutches  OCCUPATION: on disability  PLOF: Independent and Leisure: make jewelry  PATIENT GOALS: improve pain   NEXT MD VISIT: 04/06/22  OBJECTIVE:   DIAGNOSTIC FINDINGS:  Lumbar MRI imaging from 2022 exhibits mild degenerative change at L1-L2, L4-L5, and L5-S1, facet arthropathy more prominent bilaterally at level of L4-L5. No stenosis/nerve impingement noted.  PATIENT SURVEYS:  04/05/22: FOTO 33 (predicted 44)  SCREENING FOR RED FLAGS: Bowel or bladder incontinence: No Spinal tumors: No Cauda equina syndrome: No  COGNITION: Overall cognitive status: Within functional limits for tasks assessed     SENSATION: 04/05/22: WFL  MUSCLE LENGTH: 04/05/22: tightness in Rt hamstring and gastroc  POSTURE:  04/05/22: rounded shoulders, forward head, and increased lumbar lordosis  PALPATION: 04/05/22: very tender to bil greater trochanters; glutes  LUMBAR ROM:   AROM eval  Flexion Limited 75%  Extension Limited 25% (Rt lean)  Right Quadrant WNL with pain  Left Quadrant WNL with pain   (Blank rows = not tested)  UPPER EXTREMITY ROM:  Active ROM Left 04/12/22  Shoulder flexion 155  Shoulder extension   Shoulder abduction  120  Shoulder adduction   Shoulder extension   Shoulder internal rotation FIR  to Lt glute  Shoulder external rotation WFL   (Blank rows = not tested)   UPPER EXTREMITY MMT:  MMT Left 04/12/22  Shoulder flexion 4/5  Shoulder extension   Shoulder abduction 4/5  Shoulder adduction   Shoulder extension   Shoulder internal rotation 5/5  Shoulder external rotation 3+/5  (Blank rows = not tested)   SHOULDER SPECIAL TESTS: 04/12/22 Impingement tests: Hawkins/Kennedy impingement test: positive  (Lt) Rotator cuff assessment: Empty can test: positive  and Full can test: negative (positive on Lt) Biceps assessment:  no popeye deformity   LOWER EXTREMITY MMT:    MMT Right eval Left eval  Hip flexion 3/5 (give way weakness) 4/5  Hip extension    Hip abduction 4/5 with pain 4/5  Hip adduction    Hip internal rotation    Hip external rotation    Knee flexion 5/5 5/5  Knee extension 5/5 5/5  Ankle dorsiflexion    Ankle plantarflexion    Ankle inversion    Ankle eversion     (Blank rows = not tested)  LUMBAR SPECIAL TESTS:  04/05/22: Slump test: Negative  GAIT: 04/05/22: Distance walked: 150' within clinic Assistive device utilized: None Level of assistance: Modified independence Comments: decreased speed, antalgic gait  TODAY'S TREATMENT:                                                                                                                              DATE:  04/12/22 TherEx NuStep L5 x 8 min Standing rows L3 band 2x10; 3 sec hold Standing shoulder IR/ER L3 band 2x10 (Trial of reactive isometrics for ER - difficulty with maintaining position) Standing Lt shoulder abduction to 90 deg; 1# 2x10 Shoulder assessment performed (see above for details)  04/05/22  See HEP - performed trial reps PRN for comprehension   PATIENT EDUCATION:  Education details: HEP Person educated: Patient Education method: Programmer, multimedia, Demonstration, and Handouts Education  comprehension: verbalized understanding, returned demonstration, and needs further education  HOME EXERCISE PROGRAM: Access Code: [email or web address] URL: [email or web address] Date: 04/12/2022 Prepared by: Moshe Cipro  Exercises - Hooklying Single Knee to Chest  - 2 x daily - 7 x weekly - 1 sets - 3 reps - 30 sec hold - Supine Piriformis Stretch with Foot on Ground  - 2 x daily - 7 x weekly - 1 sets - 3 reps - 30 sec hold - Sidelying Hip Circles  - 2 x daily - 7 x weekly - 1 sets - 10 reps - Supine Bridge  - 2 x daily - 7 x weekly - 1 sets - 10 reps - 5 sec hold - Shoulder Internal Rotation with Resistance  - 1 x daily - 7 x weekly - 2 sets - 10 reps - Shoulder External Rotation with Anchored Resistance  - 1 x daily - 7 x weekly - 2 sets - 10 reps - Shoulder External Rotation Reactive Isometrics  - 1 x daily - 7 x weekly -  2 sets - 10 reps - Standing Shoulder Row with Anchored Resistance  - 1 x daily - 7 x weekly - 2 sets - 10 reps - 5 sec hold - Standing Single Arm Shoulder Abduction with Dumbbell - Thumb Up  - 1 x daily - 7 x weekly - 2 sets - 10 reps  ASSESSMENT:  CLINICAL IMPRESSION: Pt with new referral for Lt shoulder pain so assessed Lt shoulder today.  She has some ROM and strength deficits affecting functional mobility.  Will benefit from PT to maximize function.  OBJECTIVE IMPAIRMENTS: Abnormal gait, decreased activity tolerance, decreased endurance, decreased mobility, difficulty walking, decreased strength, increased fascial restrictions, increased muscle spasms, impaired flexibility, postural dysfunction, and pain.   ACTIVITY LIMITATIONS: carrying, lifting, bending, sitting, standing, squatting, sleeping, stairs, transfers, locomotion level, and caring for others  PARTICIPATION LIMITATIONS: meal prep, cleaning, laundry, driving, shopping, community activity, and yard work  PERSONAL FACTORS: 3+ comorbidities: Arthritis, asthma, DM, HTN, interstitial cystitis,  migraines, hx PNA, Rt THA with revision are also affecting patient's functional outcome.   REHAB POTENTIAL: Good  CLINICAL DECISION MAKING: Evolving/moderate complexity  EVALUATION COMPLEXITY: Moderate   GOALS: Goals reviewed with patient? Yes  SHORT TERM GOALS: Target date: 04/26/2022  Independent with initial HEP Goal status: INITIAL   LONG TERM GOALS: Target date: 05/17/2022   Independent with final HEP Goal status: INITIAL  2.  FOTO score improved to 44 Goal status: INITIAL  3.  Rt hip strength improved to 4+/5 for improved function and mobility Goal status: INIITAL  4.  Report pain < 5/10 with standing and walking > 20 min for improved function Goal status: INITIAL  5.  Demonstrate improved functional strength by negotiating stairs reciprocally with single handrail Goal status: INITIAL  6.  Demonstrate at least 4/5 Lt shoulder strength for improved function  Goal status: INITIAL   7.  Improved Lt shoulder FIR to at least T8 for improved UB dressing  Goal status: INITIAL     PLAN:  PT FREQUENCY: 1x/week  PT DURATION: 6 weeks  PLANNED INTERVENTIONS: Therapeutic exercises, Therapeutic activity, Neuromuscular re-education, Balance training, Gait training, Patient/Family education, Self Care, Joint mobilization, Stair training, DME instructions, Aquatic Therapy, Dry Needling, Electrical stimulation, Spinal manipulation, Spinal mobilization, Cryotherapy, Moist heat, Taping, Traction, Ultrasound, Ionotophoresis 4mg /ml Dexamethasone, Manual therapy, and Re-evaluation.  PLAN FOR NEXT SESSION: see how lumbar injection went, consider ionto to bil hip, review HEP (low back, hips and shoulder HEP) and progress as able, manual/modalities PRN     , PT, DPT 04/12/22 11:28 AM

## 2022-04-12 NOTE — Telephone Encounter (Signed)
Ok thank you 

## 2022-04-19 ENCOUNTER — Ambulatory Visit: Payer: Managed Care, Other (non HMO)

## 2022-04-19 ENCOUNTER — Encounter: Payer: Managed Care, Other (non HMO) | Admitting: Physical Therapy

## 2022-04-19 ENCOUNTER — Ambulatory Visit (INDEPENDENT_AMBULATORY_CARE_PROVIDER_SITE_OTHER): Payer: Managed Care, Other (non HMO) | Admitting: Physical Medicine and Rehabilitation

## 2022-04-19 VITALS — BP 136/80 | HR 70

## 2022-04-19 DIAGNOSIS — M47816 Spondylosis without myelopathy or radiculopathy, lumbar region: Secondary | ICD-10-CM | POA: Diagnosis not present

## 2022-04-19 MED ORDER — BUPIVACAINE HCL 0.5 % IJ SOLN
3.0000 mL | Freq: Once | INTRAMUSCULAR | Status: AC
  Administered 2022-04-19: 3 mL

## 2022-04-19 NOTE — Progress Notes (Unsigned)
Functional Pain Scale - descriptive words and definitions   Severe (9)  Cannot do any ADL's even with assistance can barely talk/unable to sleep and unable to use distraction. Severe range order  Average Pain  varies on activities   +Driver, -BT, -Dye Allergies.  Lower back pain in the middle that radiates in the right leg

## 2022-04-19 NOTE — Patient Instructions (Signed)

## 2022-04-21 NOTE — Progress Notes (Signed)
Emily Hester - 52 y.o. female MRN [id number]  Date of birth: [birth year]  Office Visit Note: Visit Date: 04/19/2022 PCP: Carol Ada, MD Referred by: Carol Ada, MD  Subjective: Chief Complaint  Patient presents with   Lower Back - Pain   HPI:  Emily Hester is a 52 y.o. female who comes in today at the request of Barnet Pall, FNP for planned Bilateral  L4-5 Lumbar facet/medial branch block with fluoroscopic guidance.  The patient has failed conservative care including home exercise, medications, time and activity modification.  This injection will be diagnostic and hopefully therapeutic.  Please see requesting physician notes for further details and justification.  Exam has shown concordant pain with facet joint loading.   ROS Otherwise per HPI.  Assessment & Plan: Visit Diagnoses:    ICD-10-CM   1. Spondylosis without myelopathy or radiculopathy, lumbar region  M47.816 XR C-ARM NO REPORT    Facet Injection    bupivacaine (MARCAINE) 0.5 % (with pres) injection 3 mL      Plan: No additional findings.   Meds & Orders:  Meds ordered this encounter  Medications   bupivacaine (MARCAINE) 0.5 % (with pres) injection 3 mL    Orders Placed This Encounter  Procedures   Facet Injection   XR C-ARM NO REPORT    Follow-up: Return for Review Pain Diary.   Procedures: No procedures performed  Lumbar Diagnostic Facet Joint Nerve Block with Fluoroscopic Guidance   [name]      Date of Birth: October 12, 1970 MRN: [id number] PCP: Carol Ada, MD      Visit Date: 04/19/2022   Universal Protocol:    Date/Time: 04/21/2408:59 AM  Consent Given By: the patient  Position: PRONE  Additional Comments: Vital signs were monitored before and after the procedure. Patient was prepped and draped in the usual sterile fashion. The correct patient, procedure, and site was verified.   Injection Procedure Details:   Procedure diagnoses:  1.  Spondylosis without myelopathy or radiculopathy, lumbar region      Meds Administered:  Meds ordered this encounter  Medications   bupivacaine (MARCAINE) 0.5 % (with pres) injection 3 mL     Laterality: Bilateral  Location/Site: L4-L5, L3 and L4 medial branches  Needle: 5.0 in., 25 ga.  Short bevel or Quincke spinal needle  Needle Placement: Oblique pedical  Findings:   -Comments: There was excellent flow of contrast along the articular pillars without intravascular flow.  Procedure Details: The fluoroscope beam is vertically oriented in AP and then obliqued 15 to 20 degrees to the ipsilateral side of the desired nerve to achieve the "Scotty dog" appearance.  The skin over the target area of the junction of the superior articulating process and the transverse process (sacral ala if blocking the L5 dorsal rami) was locally anesthetized with a 1 ml volume of 1% Lidocaine without Epinephrine.  The spinal needle was inserted and advanced in a trajectory view down to the target.   After contact with periosteum and negative aspirate for blood and CSF, correct placement without intravascular or epidural spread was confirmed by injecting 0.5 ml. of Isovue-250.  A spot radiograph was obtained of this image.    Next, a 0.5 ml. volume of the injectate described above was injected. The needle was then redirected to the other facet joint nerves mentioned above if needed.  Prior to the procedure, the patient was given a Pain Diary which was completed for baseline measurements.  After the procedure, the  patient rated their pain every 30 minutes and will continue rating at this frequency for a total of 5 hours.  The patient has been asked to complete the Diary and return to [name] by mail, fax or hand delivered as soon as possible.   Additional Comments:  The patient tolerated the procedure well Dressing: 2 x 2 sterile gauze and Band-Aid    Post-procedure details: Patient was observed during the  procedure. Post-procedure instructions were reviewed.  Patient left the clinic in stable condition.   Clinical History: MRI LUMBAR SPINE WITHOUT CONTRAST    TECHNIQUE:  Multiplanar, multisequence MR imaging of the lumbar spine was  performed. No intravenous contrast was administered.    COMPARISON:  MRI lumbar spine February 12, 2020.    FINDINGS:  Segmentation: Standard.    Alignment:  Normal.    Vertebrae: No focal marrow edema to suggest acute fracture or  discitis/osteomyelitis. No suspicious bone lesions.    Conus medullaris and cauda equina: Conus extends to the L1 level.  Conus and cauda equina appear normal.    Paraspinal and other soft tissues: Unremarkable.    Disc levels:    T12-L1: No significant disc protrusion, foraminal stenosis, or canal  stenosis.    L1-L2: Similar mild disc desiccation height loss. Similar mild disc  bulge without significant stenosis.    L2-L3: No significant disc protrusion, foraminal stenosis, or canal  stenosis.    L3-L4: No significant disc protrusion, foraminal stenosis, or canal  stenosis.    L4-L5: Similar slight disc bulging and bilateral facet arthropathy  with ligamentum flavum thickening. No significant canal or foraminal  stenosis.    L5-S1: Similar mild disc bulge with annular fissure. No significant  stenosis.    IMPRESSION:  Similar mild degenerative change at L1-L2, L4-L5, and L5-S1  (detailed above) without significant stenosis.      Electronically Signed    By: Margaretha Sheffield M.D.    On: 12/28/2020 14:42     Objective:  VS:  HT:    WT:   BMI:     BP:136/80  HR:70bpm  TEMP: ( )  RESP:  Physical Exam Vitals and nursing note reviewed.  Constitutional:      General: She is not in acute distress.    Appearance: Normal appearance. She is obese. She is not ill-appearing.  HENT:     Head: Normocephalic and atraumatic.     Right Ear: External ear normal.     Left Ear: External ear normal.  Eyes:      Extraocular Movements: Extraocular movements intact.  Cardiovascular:     Rate and Rhythm: Normal rate.     Pulses: Normal pulses.  Pulmonary:     Effort: Pulmonary effort is normal. No respiratory distress.  Abdominal:     General: There is no distension.     Palpations: Abdomen is soft.  Musculoskeletal:        General: Tenderness present.     Cervical back: Neck supple.     Right lower leg: No edema.     Left lower leg: No edema.     Comments: Patient has good distal strength with no pain over the greater trochanters.  No clonus or focal weakness.  Skin:    Findings: No erythema, lesion or rash.  Neurological:     General: No focal deficit present.     Mental Status: She is alert and oriented to person, place, and time.     Sensory: No sensory deficit.  Motor: No weakness or abnormal muscle tone.     Coordination: Coordination normal.  Psychiatric:        Mood and Affect: Mood normal.        Behavior: Behavior normal.      Imaging: No results found.

## 2022-04-21 NOTE — Procedures (Signed)
Lumbar Diagnostic Facet Joint Nerve Block with Fluoroscopic Guidance   [name]      Date of Birth: 04/26/1970 MRN: [id number] PCP: Carol Ada, MD      Visit Date: 04/19/2022   Universal Protocol:    Date/Time: 04/21/2408:59 AM  Consent Given By: the patient  Position: PRONE  Additional Comments: Vital signs were monitored before and after the procedure. Patient was prepped and draped in the usual sterile fashion. The correct patient, procedure, and site was verified.   Injection Procedure Details:   Procedure diagnoses:  1. Spondylosis without myelopathy or radiculopathy, lumbar region      Meds Administered:  Meds ordered this encounter  Medications   bupivacaine (MARCAINE) 0.5 % (with pres) injection 3 mL     Laterality: Bilateral  Location/Site: L4-L5, L3 and L4 medial branches  Needle: 5.0 in., 25 ga.  Short bevel or Quincke spinal needle  Needle Placement: Oblique pedical  Findings:   -Comments: There was excellent flow of contrast along the articular pillars without intravascular flow.  Procedure Details: The fluoroscope beam is vertically oriented in AP and then obliqued 15 to 20 degrees to the ipsilateral side of the desired nerve to achieve the "Scotty dog" appearance.  The skin over the target area of the junction of the superior articulating process and the transverse process (sacral ala if blocking the L5 dorsal rami) was locally anesthetized with a 1 ml volume of 1% Lidocaine without Epinephrine.  The spinal needle was inserted and advanced in a trajectory view down to the target.   After contact with periosteum and negative aspirate for blood and CSF, correct placement without intravascular or epidural spread was confirmed by injecting 0.5 ml. of Isovue-250.  A spot radiograph was obtained of this image.    Next, a 0.5 ml. volume of the injectate described above was injected. The needle was then redirected to the other facet joint  nerves mentioned above if needed.  Prior to the procedure, the patient was given a Pain Diary which was completed for baseline measurements.  After the procedure, the patient rated their pain every 30 minutes and will continue rating at this frequency for a total of 5 hours.  The patient has been asked to complete the Diary and return to [name] by mail, fax or hand delivered as soon as possible.   Additional Comments:  The patient tolerated the procedure well Dressing: 2 x 2 sterile gauze and Band-Aid    Post-procedure details: Patient was observed during the procedure. Post-procedure instructions were reviewed.  Patient left the clinic in stable condition.

## 2022-04-22 ENCOUNTER — Encounter: Payer: Self-pay | Admitting: Physical Medicine and Rehabilitation

## 2022-04-22 ENCOUNTER — Other Ambulatory Visit: Payer: Self-pay | Admitting: Physical Medicine and Rehabilitation

## 2022-04-22 DIAGNOSIS — M47816 Spondylosis without myelopathy or radiculopathy, lumbar region: Secondary | ICD-10-CM

## 2022-04-22 DIAGNOSIS — M47819 Spondylosis without myelopathy or radiculopathy, site unspecified: Secondary | ICD-10-CM

## 2022-04-22 DIAGNOSIS — G8929 Other chronic pain: Secondary | ICD-10-CM

## 2022-04-22 NOTE — Progress Notes (Signed)
Per pain diary greater than 80% relief with first set of diagnostic medial branch blocks, we will proceed with second set. I will place order.

## 2022-04-27 ENCOUNTER — Encounter: Payer: Managed Care, Other (non HMO) | Admitting: Physical Therapy

## 2022-05-03 ENCOUNTER — Encounter: Payer: Managed Care, Other (non HMO) | Admitting: Physical Therapy

## 2022-05-05 ENCOUNTER — Ambulatory Visit (INDEPENDENT_AMBULATORY_CARE_PROVIDER_SITE_OTHER): Payer: Managed Care, Other (non HMO) | Admitting: Physical Therapy

## 2022-05-05 ENCOUNTER — Encounter: Payer: Self-pay | Admitting: Physical Therapy

## 2022-05-05 DIAGNOSIS — M5459 Other low back pain: Secondary | ICD-10-CM | POA: Diagnosis not present

## 2022-05-05 DIAGNOSIS — M6281 Muscle weakness (generalized): Secondary | ICD-10-CM | POA: Diagnosis not present

## 2022-05-05 DIAGNOSIS — M25551 Pain in right hip: Secondary | ICD-10-CM

## 2022-05-05 DIAGNOSIS — M25512 Pain in left shoulder: Secondary | ICD-10-CM

## 2022-05-05 DIAGNOSIS — R262 Difficulty in walking, not elsewhere classified: Secondary | ICD-10-CM

## 2022-05-05 DIAGNOSIS — M546 Pain in thoracic spine: Secondary | ICD-10-CM

## 2022-05-05 DIAGNOSIS — M25552 Pain in left hip: Secondary | ICD-10-CM

## 2022-05-05 NOTE — Therapy (Signed)
OUTPATIENT PHYSICAL THERAPY TREATMENT NOTE   Patient Name: Emily Hester MRN: [id number] DOB:1970/07/02, 52 y.o., female Today's Date: 05/05/2022  END OF SESSION:   PT End of Session - 05/05/22 1018     Visit Number 3    Number of Visits 6    Date for PT Re-Evaluation 05/17/22    Authorization Type [employer] and Medicare    Progress Note Due on Visit 10    PT Start Time 1015    PT Stop Time 1055    PT Time Calculation (min) 40 min    Activity Tolerance Patient tolerated treatment well    Behavior During Therapy WFL for tasks assessed/performed              Past Medical History:  Diagnosis Date   Arthritis    patient denies   Asthma    Diabetes mellitus without complication ([place])    type    GERD (gastroesophageal reflux disease)    Hypertension    Interstitial cystitis    IUD (intrauterine device) in place placed 6 weeks ago   Migraines    MIgraines- not current     Pneumonia    Past Surgical History:  Procedure Laterality Date   ANTERIOR HIP REVISION Right 05/20/2020   Procedure: RIGHT TOTAL HIP REVISION- ploy exhange and hip ball exchange with bone graft;  Surgeon: Mcarthur Rossetti, MD;  Location: [facility];  Service: Orthopedics;  Laterality: Right;   DILATION AND EVACUATION  09/30/2011   Procedure: DILATATION AND EVACUATION;  Surgeon: Allena Katz, MD;  Location: [facility];  Service: Gynecology;  Laterality: N/A;   left knee surgery      arthroscopy    TOTAL HIP ARTHROPLASTY Right 03/11/2017   Procedure: RIGHT TOTAL HIP ARTHROPLASTY ANTERIOR APPROACH;  Surgeon: Mcarthur Rossetti, MD;  Location: [facility];  Service: Orthopedics;  Laterality: Right;   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[place]) 05/20/2020   Status post revision of total hip 05/20/2020   Trochanteric bursitis, right hip 09/05/2019   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   Unilateral primary osteoarthritis, right hip  01/27/2017     THERAPY DIAG:  Other low back pain  Pain in thoracic spine  Muscle weakness (generalized)  Difficulty in walking, not elsewhere classified  Pain in right hip  Pain in left hip  Acute pain of left shoulder   PCP: Carol Ada, MD  REFERRING PROVIDER: Mcarthur Rossetti, MD/[place], [place], [place]  REFERRING DIAG: M70.61 (ICD-10-CM) - Trochanteric bursitis, right hip M70.62 (ICD-10-CM) - Trochanteric bursitis, left hip  Rationale for Evaluation and Treatment: Rehabilitation  EVAL THERAPY DIAG:  Other low back pain - Plan: PT plan of care cert/re-cert  Pain in thoracic spine - Plan: PT plan of care cert/re-cert  Muscle weakness (generalized) - Plan: PT plan of care cert/re-cert  Difficulty in walking, not elsewhere classified - Plan: PT plan of care cert/re-cert  Pain in right hip - Plan: PT plan of care cert/re-cert  Pain in left hip - Plan: PT plan of care cert/re-cert  Acute pain of left shoulder - Plan: PT plan of care cert/re-cert  ONSET DATE: Chronic  SUBJECTIVE:  SUBJECTIVE STATEMENT: Back is feeling better after the injection, and shoulder is getting better with exercises  PERTINENT HISTORY:  Arthritis, asthma, DM, HTN, interstitial cystitis, migraines, hx PNA, Rt THA with revision  PAIN:  Are you having pain? Yes: NPRS scale: 5 currently in back, 3/10 in Lt shoulder Pain location: low back and bil hips, radiates to Rt foot Pain description: reports tingling in ant Rt shin and foot, burning and stinging in the back, aching and throbbing in hips Aggravating factors: laying on side, walking longer distances (grocery store), standing to wash dishes Relieving factors: sit and get comfortable, medication for pain  PRECAUTIONS: None  WEIGHT BEARING RESTRICTIONS:  No  FALLS:  Has patient fallen in last 6 months? No  LIVING ENVIRONMENT: Lives with: lives with their family (husband and 31 y/o daughter) - helping with daughter Lives in: House/apartment Stairs: Yes: Internal: 44 (to get to basement with laundry) steps; on right going up and External: 5 steps; on right going up; must use step to pattern Has following equipment at home: Single point cane, Walker - 2 wheeled, and Crutches  OCCUPATION: on disability  PLOF: Independent and Leisure: make jewelry  PATIENT GOALS: improve pain   NEXT MD VISIT: 04/06/22  OBJECTIVE:   DIAGNOSTIC FINDINGS:  Lumbar MRI imaging from 2022 exhibits mild degenerative change at L1-L2, L4-L5, and L5-S1, facet arthropathy more prominent bilaterally at level of L4-L5. No stenosis/nerve impingement noted.  PATIENT SURVEYS:  04/05/22: FOTO 33 (predicted 44)  SCREENING FOR RED FLAGS: Bowel or bladder incontinence: No Spinal tumors: No Cauda equina syndrome: No  COGNITION: Overall cognitive status: Within functional limits for tasks assessed     SENSATION: 04/05/22: WFL  MUSCLE LENGTH: 04/05/22: tightness in Rt hamstring and gastroc  POSTURE:  04/05/22: rounded shoulders, forward head, and increased lumbar lordosis  PALPATION: 04/05/22: very tender to bil greater trochanters; glutes  LUMBAR ROM:   AROM eval  Flexion Limited 75%  Extension Limited 25% (Rt lean)  Right Quadrant WNL with pain  Left Quadrant WNL with pain   (Blank rows = not tested)  UPPER EXTREMITY ROM:  Active ROM Left 04/12/22  Shoulder flexion 155  Shoulder extension   Shoulder abduction 120  Shoulder adduction   Shoulder extension   Shoulder internal rotation FIR to Lt glute  Shoulder external rotation WFL   (Blank rows = not tested)   UPPER EXTREMITY MMT:  MMT Left 04/12/22  Shoulder flexion 4/5  Shoulder extension   Shoulder abduction 4/5  Shoulder adduction   Shoulder extension   Shoulder internal rotation 5/5   Shoulder external rotation 3+/5  (Blank rows = not tested)   SHOULDER SPECIAL TESTS: 04/12/22 Impingement tests: Hawkins/Kennedy impingement test: positive  (Lt) Rotator cuff assessment: Empty can test: positive  and Full can test: negative (positive on Lt) Biceps assessment:  no popeye deformity   LOWER EXTREMITY MMT:    MMT Right eval Left eval  Hip flexion 3/5 (give way weakness) 4/5  Hip extension    Hip abduction 4/5 with pain 4/5  Hip adduction    Hip internal rotation    Hip external rotation    Knee flexion 5/5 5/5  Knee extension 5/5 5/5  Ankle dorsiflexion    Ankle plantarflexion    Ankle inversion    Ankle eversion     (Blank rows = not tested)  LUMBAR SPECIAL TESTS:  04/05/22: Slump test: Negative  GAIT: 04/05/22: Distance walked: 150' within clinic Assistive device utilized: None Level of assistance: Modified  independence Comments: decreased speed, antalgic gait  TODAY'S TREATMENT:                                                                                                                              DATE:  05/05/22 TherEx UBE UE/LEs L3 x 6 min Cable Rows 20# 3x10 Lat pull downs 20# 2x10 (mild increase in pain following) Lt shoulder flexion and abduction 2x10 each; 1# Standing ER/IR with L3 band 2x10 Quadruped hip extension x 10 reps bil Single knee to chest 3x30 sec bil Piriformis stretch 3x30 sec bil (knee to opposite shoulder) Bridges x 10 reps  04/12/22 TherEx NuStep L5 x 8 min Standing rows L3 band 2x10; 3 sec hold Standing shoulder IR/ER L3 band 2x10 (Trial of reactive isometrics for ER - difficulty with maintaining position) Standing Lt shoulder abduction to 90 deg; 1# 2x10 Shoulder assessment performed (see above for details)  04/05/22  See HEP - performed trial reps PRN for comprehension   PATIENT EDUCATION:  Education details: HEP Person educated: Patient Education method: Consulting civil engineer, Demonstration, and Handouts Education  comprehension: verbalized understanding, returned demonstration, and needs further education  HOME EXERCISE PROGRAM: Access Code: [email or web address] URL: [email or web address] Date: 04/12/2022 Prepared by: Faustino Congress  Exercises - Hooklying Single Knee to Chest  - 2 x daily - 7 x weekly - 1 sets - 3 reps - 30 sec hold - Supine Piriformis Stretch with Foot on Ground  - 2 x daily - 7 x weekly - 1 sets - 3 reps - 30 sec hold - Sidelying Hip Circles  - 2 x daily - 7 x weekly - 1 sets - 10 reps - Supine Bridge  - 2 x daily - 7 x weekly - 1 sets - 10 reps - 5 sec hold - Shoulder Internal Rotation with Resistance  - 1 x daily - 7 x weekly - 2 sets - 10 reps - Shoulder External Rotation with Anchored Resistance  - 1 x daily - 7 x weekly - 2 sets - 10 reps - Shoulder External Rotation Reactive Isometrics  - 1 x daily - 7 x weekly - 2 sets - 10 reps - Standing Shoulder Row with Anchored Resistance  - 1 x daily - 7 x weekly - 2 sets - 10 reps - 5 sec hold - Standing Single Arm Shoulder Abduction with Dumbbell - Thumb Up  - 1 x daily - 7 x weekly - 2 sets - 10 reps  ASSESSMENT:  CLINICAL IMPRESSION: Pt tolerated session well today with progression of strengthening exercises working both shoulders and back.  Will continue to benefit from PT to maximize function.  OBJECTIVE IMPAIRMENTS: Abnormal gait, decreased activity tolerance, decreased endurance, decreased mobility, difficulty walking, decreased strength, increased fascial restrictions, increased muscle spasms, impaired flexibility, postural dysfunction, and pain.   ACTIVITY LIMITATIONS: carrying, lifting, bending, sitting, standing, squatting, sleeping, stairs, transfers, locomotion level, and caring for others  PARTICIPATION LIMITATIONS: meal prep, cleaning, laundry, driving, shopping,  community activity, and yard work  PERSONAL FACTORS: 3+ comorbidities: Arthritis, asthma, DM, HTN, interstitial cystitis, migraines, hx PNA, Rt THA  with revision are also affecting patient's functional outcome.   REHAB POTENTIAL: Good  CLINICAL DECISION MAKING: Evolving/moderate complexity  EVALUATION COMPLEXITY: Moderate   GOALS: Goals reviewed with patient? Yes  SHORT TERM GOALS: Target date: 04/26/2022  Independent with initial HEP Goal status: MET 05/05/22   LONG TERM GOALS: Target date: 05/17/2022   Independent with final HEP Goal status: INITIAL  2.  FOTO score improved to 44 Goal status: INITIAL  3.  Rt hip strength improved to 4+/5 for improved function and mobility Goal status: INIITAL  4.  Report pain < 5/10 with standing and walking > 20 min for improved function Goal status: INITIAL  5.  Demonstrate improved functional strength by negotiating stairs reciprocally with single handrail Goal status: INITIAL  6.  Demonstrate at least 4/5 Lt shoulder strength for improved function  Goal status: INITIAL   7.  Improved Lt shoulder FIR to at least T8 for improved UB dressing  Goal status: INITIAL     PLAN:  PT FREQUENCY: 1x/week  PT DURATION: 6 weeks  PLANNED INTERVENTIONS: Therapeutic exercises, Therapeutic activity, Neuromuscular re-education, Balance training, Gait training, Patient/Family education, Self Care, Joint mobilization, Stair training, DME instructions, Aquatic Therapy, Dry Needling, Electrical stimulation, Spinal manipulation, Spinal mobilization, Cryotherapy, Moist heat, Taping, Traction, Ultrasound, Ionotophoresis 61m/ml Dexamethasone, Manual therapy, and Re-evaluation.  PLAN FOR NEXT SESSION: see how second injection went,  consider ionto to bil hip, review HEP (low back, hips and shoulder HEP) and progress as able, manual/modalities PRN     SLaureen Abrahams PT, DPT 05/05/22 11:11 AM

## 2022-05-10 ENCOUNTER — Ambulatory Visit: Payer: Self-pay

## 2022-05-10 ENCOUNTER — Ambulatory Visit (INDEPENDENT_AMBULATORY_CARE_PROVIDER_SITE_OTHER): Payer: Managed Care, Other (non HMO) | Admitting: Physical Medicine and Rehabilitation

## 2022-05-10 VITALS — BP 132/85 | HR 80

## 2022-05-10 DIAGNOSIS — M47819 Spondylosis without myelopathy or radiculopathy, site unspecified: Secondary | ICD-10-CM

## 2022-05-10 MED ORDER — BUPIVACAINE HCL 0.5 % IJ SOLN
3.0000 mL | Freq: Once | INTRAMUSCULAR | Status: AC
  Administered 2022-05-10: 3 mL

## 2022-05-10 NOTE — Patient Instructions (Signed)

## 2022-05-10 NOTE — Progress Notes (Unsigned)
Functional Pain Scale - descriptive words and definitions  Distracting (5)    Aware of pain/able to complete some ADL's but limited by pain/sleep is affected and active distractions are only slightly useful. Moderate range order  Average Pain  varies   +Driver, -BT, -Dye Allergies.  Lower back pain on both sides with no radiation

## 2022-05-12 ENCOUNTER — Other Ambulatory Visit: Payer: Self-pay | Admitting: Physical Medicine and Rehabilitation

## 2022-05-12 ENCOUNTER — Encounter: Payer: Self-pay | Admitting: Physical Medicine and Rehabilitation

## 2022-05-12 DIAGNOSIS — M47816 Spondylosis without myelopathy or radiculopathy, lumbar region: Secondary | ICD-10-CM

## 2022-05-12 DIAGNOSIS — G8929 Other chronic pain: Secondary | ICD-10-CM

## 2022-05-12 NOTE — Progress Notes (Signed)
Emily Hester - 51 y.o. female MRN [id number]  Date of birth: [birth year]  Office Visit Note: Visit Date: 05/10/2022 PCP: Carol Ada, MD Referred by: Carol Ada, MD  Subjective: Chief Complaint  Patient presents with   Lower Back - Pain   HPI:  Emily Hester is a 52 y.o. female who comes in today for planned repeat Bilateral L4-5 Lumbar facet/medial branch block with fluoroscopic guidance.  The patient has failed conservative care including home exercise, medications, time and activity modification.  This injection will be diagnostic and hopefully therapeutic.  Please see requesting physician notes for further details and justification.  Exam shows concordant low back pain with facet joint loading and extension. Patient received more than 80% pain relief from prior injection. This would be the second block in a diagnostic double block paradigm.     Referring:Megan Jimmye Norman, FNP   ROS Otherwise per HPI.  Assessment & Plan: Visit Diagnoses:    ICD-10-CM   1. Spondylosis without myelopathy or radiculopathy  M47.819 XR C-ARM NO REPORT    Facet Injection    bupivacaine (MARCAINE) 0.5 % (with pres) injection 3 mL      Plan: No additional findings.   Meds & Orders:  Meds ordered this encounter  Medications   bupivacaine (MARCAINE) 0.5 % (with pres) injection 3 mL    Orders Placed This Encounter  Procedures   Facet Injection   XR C-ARM NO REPORT    Follow-up: Return for Review Pain Diary.   Procedures: No procedures performed  Lumbar Diagnostic Facet Joint Nerve Block with Fluoroscopic Guidance   [name]      Date of Birth: 1970/12/28 MRN: [id number] PCP: Carol Ada, MD      Visit Date: 05/10/2022   Universal Protocol:    Date/Time: 02/28/247:38 AM  Consent Given By: the patient  Position: PRONE  Additional Comments: Vital signs were monitored before and after the procedure. Patient was prepped and draped in the usual  sterile fashion. The correct patient, procedure, and site was verified.   Injection Procedure Details:   Procedure diagnoses:  1. Spondylosis without myelopathy or radiculopathy      Meds Administered:  Meds ordered this encounter  Medications   bupivacaine (MARCAINE) 0.5 % (with pres) injection 3 mL     Laterality: Bilateral  Location/Site: L4-L5, L3 and L4 medial branches  Needle: 5.0 in., 25 ga.  Short bevel or Quincke spinal needle  Needle Placement: Oblique pedical  Findings:   -Comments: There was excellent flow of contrast along the articular pillars without intravascular flow.  Procedure Details: The fluoroscope beam is vertically oriented in AP and then obliqued 15 to 20 degrees to the ipsilateral side of the desired nerve to achieve the "Scotty dog" appearance.  The skin over the target area of the junction of the superior articulating process and the transverse process (sacral ala if blocking the L5 dorsal rami) was locally anesthetized with a 1 ml volume of 1% Lidocaine without Epinephrine.  The spinal needle was inserted and advanced in a trajectory view down to the target.   After contact with periosteum and negative aspirate for blood and CSF, correct placement without intravascular or epidural spread was confirmed by injecting 0.5 ml. of Isovue-250.  A spot radiograph was obtained of this image.    Next, a 0.5 ml. volume of the injectate described above was injected. The needle was then redirected to the other facet joint nerves mentioned above if needed.  Prior  to the procedure, the patient was given a Pain Diary which was completed for baseline measurements.  After the procedure, the patient rated their pain every 30 minutes and will continue rating at this frequency for a total of 5 hours.  The patient has been asked to complete the Diary and return to [name] by mail, fax or hand delivered as soon as possible.   Additional Comments:  No complications  occurred Dressing: 2 x 2 sterile gauze and Band-Aid    Post-procedure details: Patient was observed during the procedure. Post-procedure instructions were reviewed.  Patient left the clinic in stable condition.   Clinical History: MRI LUMBAR SPINE WITHOUT CONTRAST    TECHNIQUE:  Multiplanar, multisequence MR imaging of the lumbar spine was  performed. No intravenous contrast was administered.    COMPARISON:  MRI lumbar spine February 12, 2020.    FINDINGS:  Segmentation: Standard.    Alignment:  Normal.    Vertebrae: No focal marrow edema to suggest acute fracture or  discitis/osteomyelitis. No suspicious bone lesions.    Conus medullaris and cauda equina: Conus extends to the L1 level.  Conus and cauda equina appear normal.    Paraspinal and other soft tissues: Unremarkable.    Disc levels:    T12-L1: No significant disc protrusion, foraminal stenosis, or canal  stenosis.    L1-L2: Similar mild disc desiccation height loss. Similar mild disc  bulge without significant stenosis.    L2-L3: No significant disc protrusion, foraminal stenosis, or canal  stenosis.    L3-L4: No significant disc protrusion, foraminal stenosis, or canal  stenosis.    L4-L5: Similar slight disc bulging and bilateral facet arthropathy  with ligamentum flavum thickening. No significant canal or foraminal  stenosis.    L5-S1: Similar mild disc bulge with annular fissure. No significant  stenosis.    IMPRESSION:  Similar mild degenerative change at L1-L2, L4-L5, and L5-S1  (detailed above) without significant stenosis.      Electronically Signed    By: Margaretha Sheffield M.D.    On: 12/28/2020 14:42     Objective:  VS:  HT:    WT:   BMI:     BP:132/85  HR:80bpm  TEMP: ( )  RESP:  Physical Exam Vitals and nursing note reviewed.  Constitutional:      General: She is not in acute distress.    Appearance: Normal appearance. She is obese. She is not ill-appearing.  HENT:      Head: Normocephalic and atraumatic.     Right Ear: External ear normal.     Left Ear: External ear normal.  Eyes:     Extraocular Movements: Extraocular movements intact.  Cardiovascular:     Rate and Rhythm: Normal rate.     Pulses: Normal pulses.  Pulmonary:     Effort: Pulmonary effort is normal. No respiratory distress.  Abdominal:     General: There is no distension.     Palpations: Abdomen is soft.  Musculoskeletal:        General: Tenderness present.     Cervical back: Neck supple.     Right lower leg: No edema.     Left lower leg: No edema.     Comments: Patient has good distal strength with no pain over the greater trochanters.  No clonus or focal weakness.  Skin:    Findings: No erythema, lesion or rash.  Neurological:     General: No focal deficit present.     Mental Status: She is alert  and oriented to person, place, and time.     Sensory: No sensory deficit.     Motor: No weakness or abnormal muscle tone.     Coordination: Coordination normal.  Psychiatric:        Mood and Affect: Mood normal.        Behavior: Behavior normal.      Imaging: No results found.

## 2022-05-12 NOTE — Procedures (Signed)
Lumbar Diagnostic Facet Joint Nerve Block with Fluoroscopic Guidance   [name]      Date of Birth: 02/05/1971 MRN: [id number] PCP: Carol Ada, MD      Visit Date: 05/10/2022   Universal Protocol:    Date/Time: 02/28/247:38 AM  Consent Given By: the patient  Position: PRONE  Additional Comments: Vital signs were monitored before and after the procedure. Patient was prepped and draped in the usual sterile fashion. The correct patient, procedure, and site was verified.   Injection Procedure Details:   Procedure diagnoses:  1. Spondylosis without myelopathy or radiculopathy      Meds Administered:  Meds ordered this encounter  Medications   bupivacaine (MARCAINE) 0.5 % (with pres) injection 3 mL     Laterality: Bilateral  Location/Site: L4-L5, L3 and L4 medial branches  Needle: 5.0 in., 25 ga.  Short bevel or Quincke spinal needle  Needle Placement: Oblique pedical  Findings:   -Comments: There was excellent flow of contrast along the articular pillars without intravascular flow.  Procedure Details: The fluoroscope beam is vertically oriented in AP and then obliqued 15 to 20 degrees to the ipsilateral side of the desired nerve to achieve the "Scotty dog" appearance.  The skin over the target area of the junction of the superior articulating process and the transverse process (sacral ala if blocking the L5 dorsal rami) was locally anesthetized with a 1 ml volume of 1% Lidocaine without Epinephrine.  The spinal needle was inserted and advanced in a trajectory view down to the target.   After contact with periosteum and negative aspirate for blood and CSF, correct placement without intravascular or epidural spread was confirmed by injecting 0.5 ml. of Isovue-250.  A spot radiograph was obtained of this image.    Next, a 0.5 ml. volume of the injectate described above was injected. The needle was then redirected to the other facet joint nerves  mentioned above if needed.  Prior to the procedure, the patient was given a Pain Diary which was completed for baseline measurements.  After the procedure, the patient rated their pain every 30 minutes and will continue rating at this frequency for a total of 5 hours.  The patient has been asked to complete the Diary and return to [name] by mail, fax or hand delivered as soon as possible.   Additional Comments:  No complications occurred Dressing: 2 x 2 sterile gauze and Band-Aid    Post-procedure details: Patient was observed during the procedure. Post-procedure instructions were reviewed.  Patient left the clinic in stable condition.

## 2022-05-12 NOTE — Progress Notes (Signed)
Per pain diary, greater than 80% relief of pain with second set of diagnostic facet blocks. We will proceed with radiofrequency ablation.

## 2022-05-20 ENCOUNTER — Encounter: Payer: Self-pay | Admitting: Physical Therapy

## 2022-05-20 ENCOUNTER — Encounter: Payer: Self-pay | Admitting: Radiology

## 2022-05-20 ENCOUNTER — Ambulatory Visit: Payer: Managed Care, Other (non HMO) | Admitting: Physical Therapy

## 2022-05-20 DIAGNOSIS — M546 Pain in thoracic spine: Secondary | ICD-10-CM

## 2022-05-20 DIAGNOSIS — M5459 Other low back pain: Secondary | ICD-10-CM | POA: Diagnosis not present

## 2022-05-20 DIAGNOSIS — M25512 Pain in left shoulder: Secondary | ICD-10-CM

## 2022-05-20 DIAGNOSIS — M25552 Pain in left hip: Secondary | ICD-10-CM

## 2022-05-20 DIAGNOSIS — M25551 Pain in right hip: Secondary | ICD-10-CM

## 2022-05-20 DIAGNOSIS — R262 Difficulty in walking, not elsewhere classified: Secondary | ICD-10-CM

## 2022-05-20 DIAGNOSIS — M6281 Muscle weakness (generalized): Secondary | ICD-10-CM | POA: Diagnosis not present

## 2022-05-20 NOTE — Addendum Note (Signed)
Addended by: Laureen Abrahams on: 05/20/2022 03:47 PM   Modules accepted: Orders

## 2022-05-20 NOTE — Therapy (Addendum)
OUTPATIENT PHYSICAL THERAPY TREATMENT NOTE RECERTIFICATION   Patient Name: BREALYNN MCALEXANDER MRN: [id number] DOB:10/03/1970, 52 y.o., female Today's Date: 05/20/2022  END OF SESSION:   PT End of Session - 05/20/22 1507     Visit Number 4    Number of Visits 8    Date for PT Re-Evaluation 06/17/22    Authorization Type [employer] and Medicare    Progress Note Due on Visit 10    PT Start Time 1505    PT Stop Time 1543    PT Time Calculation (min) 38 min    Activity Tolerance Patient tolerated treatment well    Behavior During Therapy WFL for tasks assessed/performed               Past Medical History:  Diagnosis Date   Arthritis    patient denies   Asthma    Diabetes mellitus without complication ([place])    type    GERD (gastroesophageal reflux disease)    Hypertension    Interstitial cystitis    IUD (intrauterine device) in place placed 6 weeks ago   Migraines    MIgraines- not current     Pneumonia    Past Surgical History:  Procedure Laterality Date   ANTERIOR HIP REVISION Right 05/20/2020   Procedure: RIGHT TOTAL HIP REVISION- ploy exhange and hip ball exchange with bone graft;  Surgeon: Mcarthur Rossetti, MD;  Location: [facility];  Service: Orthopedics;  Laterality: Right;   DILATION AND EVACUATION  09/30/2011   Procedure: DILATATION AND EVACUATION;  Surgeon: Allena Katz, MD;  Location: [facility];  Service: Gynecology;  Laterality: N/A;   left knee surgery      arthroscopy    TOTAL HIP ARTHROPLASTY Right 03/11/2017   Procedure: RIGHT TOTAL HIP ARTHROPLASTY ANTERIOR APPROACH;  Surgeon: Mcarthur Rossetti, MD;  Location: [facility];  Service: Orthopedics;  Laterality: Right;   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[place]) 05/20/2020   Status post revision of total hip 05/20/2020   Trochanteric bursitis, right hip 09/05/2019   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   Unilateral primary  osteoarthritis, right hip 01/27/2017     THERAPY DIAG:  Other low back pain  Pain in thoracic spine  Muscle weakness (generalized)  Difficulty in walking, not elsewhere classified  Pain in right hip  Pain in left hip  Acute pain of left shoulder   PCP: Carol Ada, MD  REFERRING PROVIDER: Mcarthur Rossetti, MD/[place], [place], [place]  REFERRING DIAG: M70.61 (ICD-10-CM) - Trochanteric bursitis, right hip M70.62 (ICD-10-CM) - Trochanteric bursitis, left hip  Rationale for Evaluation and Treatment: Rehabilitation  EVAL THERAPY DIAG:  Other low back pain - Plan: PT plan of care cert/re-cert  Pain in thoracic spine - Plan: PT plan of care cert/re-cert  Muscle weakness (generalized) - Plan: PT plan of care cert/re-cert  Difficulty in walking, not elsewhere classified - Plan: PT plan of care cert/re-cert  Pain in right hip - Plan: PT plan of care cert/re-cert  Pain in left hip - Plan: PT plan of care cert/re-cert  Acute pain of left shoulder - Plan: PT plan of care cert/re-cert  ONSET DATE: Chronic  SUBJECTIVE:  SUBJECTIVE STATEMENT: Second injection only lasted about a week; now schedule for RFA next week  PERTINENT HISTORY:  Arthritis, asthma, DM, HTN, interstitial cystitis, migraines, hx PNA, Rt THA with revision  PAIN:  Are you having pain? Yes: NPRS scale: 6 currently in back, 2/10 in Lt shoulder Pain location: low back and bil hips, radiates to Rt foot Pain description: reports tingling in ant Rt shin and foot, burning and stinging in the back, aching and throbbing in hips Aggravating factors: laying on side, walking longer distances (grocery store), standing to wash dishes Relieving factors: sit and get comfortable, medication for pain  PRECAUTIONS: None  WEIGHT BEARING  RESTRICTIONS: No  FALLS:  Has patient fallen in last 6 months? No  LIVING ENVIRONMENT: Lives with: lives with their family (husband and 26 y/o daughter) - helping with daughter Lives in: House/apartment Stairs: Yes: Internal: 12 (to get to basement with laundry) steps; on right going up and External: 5 steps; on right going up; must use step to pattern Has following equipment at home: Single point cane, Walker - 2 wheeled, and Crutches  OCCUPATION: on disability  PLOF: Independent and Leisure: make jewelry  PATIENT GOALS: improve pain   NEXT MD VISIT: 04/06/22  OBJECTIVE:   DIAGNOSTIC FINDINGS:  Lumbar MRI imaging from 2022 exhibits mild degenerative change at L1-L2, L4-L5, and L5-S1, facet arthropathy more prominent bilaterally at level of L4-L5. No stenosis/nerve impingement noted.  PATIENT SURVEYS:  04/05/22: FOTO 33 (predicted 44)  SCREENING FOR RED FLAGS: Bowel or bladder incontinence: No Spinal tumors: No Cauda equina syndrome: No  COGNITION: Overall cognitive status: Within functional limits for tasks assessed     SENSATION: 04/05/22: WFL  MUSCLE LENGTH: 04/05/22: tightness in Rt hamstring and gastroc  POSTURE:  04/05/22: rounded shoulders, forward head, and increased lumbar lordosis  PALPATION: 04/05/22: very tender to bil greater trochanters; glutes  LUMBAR ROM:   AROM eval  Flexion Limited 75%  Extension Limited 25% (Rt lean)  Right Quadrant WNL with pain  Left Quadrant WNL with pain   (Blank rows = not tested)  UPPER EXTREMITY ROM:  Active ROM Left 04/12/22  Shoulder flexion 155  Shoulder extension   Shoulder abduction 120  Shoulder adduction   Shoulder extension   Shoulder internal rotation FIR to Lt glute  Shoulder external rotation WFL   (Blank rows = not tested)   UPPER EXTREMITY MMT:  MMT Left 04/12/22  Shoulder flexion 4/5  Shoulder extension   Shoulder abduction 4/5  Shoulder adduction   Shoulder extension   Shoulder internal  rotation 5/5  Shoulder external rotation 3+/5  (Blank rows = not tested)   SHOULDER SPECIAL TESTS: 04/12/22 Impingement tests: Hawkins/Kennedy impingement test: positive  (Lt) Rotator cuff assessment: Empty can test: positive  and Full can test: negative (positive on Lt) Biceps assessment:  no popeye deformity   LOWER EXTREMITY MMT:    MMT Right eval Left eval  Hip flexion 3/5 (give way weakness) 4/5  Hip extension    Hip abduction 4/5 with pain 4/5  Hip adduction    Hip internal rotation    Hip external rotation    Knee flexion 5/5 5/5  Knee extension 5/5 5/5  Ankle dorsiflexion    Ankle plantarflexion    Ankle inversion    Ankle eversion     (Blank rows = not tested)  LUMBAR SPECIAL TESTS:  04/05/22: Slump test: Negative  GAIT: 04/05/22: Distance walked: 150' within clinic Assistive device utilized: None Level of assistance: Modified independence  Comments: decreased speed, antalgic gait  TODAY'S TREATMENT:                                                                                                                              DATE:  05/20/22 TherEx UBE UE/LEs L3 x 6 min Lateral step ups with contralateral hip abduction 2x10 bil; light UE support needed Squats 2x10 Dead lifts with 10# KB 2x10 Seated SLR x10 bil Sit to/from stand without UE support x 10 reps   05/05/22 TherEx UBE UE/LEs L3 x 6 min Cable Rows 20# 3x10 Lat pull downs 20# 2x10 (mild increase in pain following) Lt shoulder flexion and abduction 2x10 each; 1# Standing ER/IR with L3 band 2x10 Quadruped hip extension x 10 reps bil Single knee to chest 3x30 sec bil Piriformis stretch 3x30 sec bil (knee to opposite shoulder) Bridges x 10 reps  04/12/22 TherEx NuStep L5 x 8 min Standing rows L3 band 2x10; 3 sec hold Standing shoulder IR/ER L3 band 2x10 (Trial of reactive isometrics for ER - difficulty with maintaining position) Standing Lt shoulder abduction to 90 deg; 1# 2x10 Shoulder  assessment performed (see above for details)  04/05/22  See HEP - performed trial reps PRN for comprehension   PATIENT EDUCATION:  Education details: HEP Person educated: Patient Education method: Consulting civil engineer, Demonstration, and Handouts Education comprehension: verbalized understanding, returned demonstration, and needs further education  HOME EXERCISE PROGRAM: Access Code: [email or web address] URL: [email or web address] Date: 05/20/2022 Prepared by: Faustino Congress  Exercises - Hooklying Single Knee to Chest  - 2 x daily - 7 x weekly - 1 sets - 3 reps - 30 sec hold - Supine Piriformis Stretch with Foot on Ground  - 2 x daily - 7 x weekly - 1 sets - 3 reps - 30 sec hold - Sidelying Hip Circles  - 2 x daily - 7 x weekly - 1 sets - 10 reps - Supine Bridge  - 2 x daily - 7 x weekly - 1 sets - 10 reps - 5 sec hold - Shoulder Internal Rotation with Resistance  - 1 x daily - 7 x weekly - 2 sets - 10 reps - Shoulder External Rotation with Anchored Resistance  - 1 x daily - 7 x weekly - 2 sets - 10 reps - Shoulder External Rotation Reactive Isometrics  - 1 x daily - 7 x weekly - 2 sets - 10 reps - Standing Shoulder Row with Anchored Resistance  - 1 x daily - 7 x weekly - 2 sets - 10 reps - 5 sec hold - Standing Single Arm Shoulder Abduction with Dumbbell - Thumb Up  - 1 x daily - 7 x weekly - 2 sets - 10 reps - Lateral Step Up  - 1 x daily - 7 x weekly - 2-3 sets - 10 reps - Squat  - 1 x daily - 7 x weekly - 2-3 sets - 10 reps - Half Deadlift with Kettlebell  -  1 x daily - 7 x weekly - 2-3 sets - 10 reps  ASSESSMENT:  CLINICAL IMPRESSION: Pt requesting strengthening program so added to current HEP.  Feel she would benefit from aquatic therapy so set up for next visit aquatics.  Will continue to benefit from PT to maximize function.   OBJECTIVE IMPAIRMENTS: Abnormal gait, decreased activity tolerance, decreased endurance, decreased mobility, difficulty walking, decreased strength,  increased fascial restrictions, increased muscle spasms, impaired flexibility, postural dysfunction, and pain.   ACTIVITY LIMITATIONS: carrying, lifting, bending, sitting, standing, squatting, sleeping, stairs, transfers, locomotion level, and caring for others  PARTICIPATION LIMITATIONS: meal prep, cleaning, laundry, driving, shopping, community activity, and yard work  PERSONAL FACTORS: 3+ comorbidities: Arthritis, asthma, DM, HTN, interstitial cystitis, migraines, hx PNA, Rt THA with revision are also affecting patient's functional outcome.   REHAB POTENTIAL: Good  CLINICAL DECISION MAKING: Evolving/moderate complexity  EVALUATION COMPLEXITY: Moderate   GOALS: Goals reviewed with patient? Yes  SHORT TERM GOALS: Target date: 04/26/2022  Independent with initial HEP Goal status: MET 05/05/22   LONG TERM GOALS: Target date: 06/17/2022   Independent with final HEP Goal status: ONGOING 05/20/22  2.  FOTO score improved to 44 Goal status: ONGOING 05/20/22  3.  Rt hip strength improved to 4+/5 for improved function and mobility Goal status: ONGOING 05/20/22  4.  Report pain < 5/10 with standing and walking > 20 min for improved function Goal status: ONGOING 05/20/22  5.  Demonstrate improved functional strength by negotiating stairs reciprocally with single handrail Goal status: INITIAL  6.  Demonstrate at least 4/5 Lt shoulder strength for improved function  Goal status: ONGOING 05/20/22  7.  Improved Lt shoulder FIR to at least T8 for improved UB dressing  Goal status: ONGOING 05/20/22    PLAN:  PT FREQUENCY: 1x/week  PT DURATION: 4 weeks  PLANNED INTERVENTIONS: Therapeutic exercises, Therapeutic activity, Neuromuscular re-education, Balance training, Gait training, Patient/Family education, Self Care, Joint mobilization, Stair training, DME instructions, Aquatic Therapy, Dry Needling, Electrical stimulation, Spinal manipulation, Spinal mobilization, Cryotherapy, Moist heat,  Taping, Traction, Ultrasound, Ionotophoresis '4mg'$ /ml Dexamethasone, Manual therapy, and Re-evaluation.  PLAN FOR NEXT SESSION: aquatic therapy for strengthening, review HEP (low back, hips and shoulder HEP) and progress as able, manual/modalities PRN     Laureen Abrahams, PT, DPT 05/20/22 3:45 PM

## 2022-05-24 ENCOUNTER — Encounter: Payer: Self-pay | Admitting: Physical Therapy

## 2022-05-24 MED ORDER — DIAZEPAM 5 MG PO TABS: ORAL_TABLET | ORAL | 0 refills | Status: DC

## 2022-05-25 ENCOUNTER — Encounter: Payer: Managed Care, Other (non HMO) | Admitting: Physical Therapy

## 2022-05-27 ENCOUNTER — Ambulatory Visit (INDEPENDENT_AMBULATORY_CARE_PROVIDER_SITE_OTHER): Payer: Managed Care, Other (non HMO) | Admitting: Physical Medicine and Rehabilitation

## 2022-05-27 ENCOUNTER — Other Ambulatory Visit: Payer: Self-pay

## 2022-05-27 VITALS — BP 133/79 | HR 93

## 2022-05-27 DIAGNOSIS — M47816 Spondylosis without myelopathy or radiculopathy, lumbar region: Secondary | ICD-10-CM

## 2022-05-27 MED ORDER — METHYLPREDNISOLONE ACETATE 80 MG/ML IJ SUSP
80.0000 mg | Freq: Once | INTRAMUSCULAR | Status: AC
  Administered 2022-05-27: 80 mg

## 2022-05-27 NOTE — Progress Notes (Signed)
Functional Pain Scale - descriptive words and definitions  Unmanageable (7)  Pain interferes with normal ADL's/nothing seems to help/sleep is very difficult/active distractions are very difficult to concentrate on. Severe range order  Average Pain 9   +Driver, -BT, -Dye Allergies.  Lower back pain

## 2022-05-27 NOTE — Patient Instructions (Signed)

## 2022-05-31 ENCOUNTER — Encounter: Payer: Self-pay | Admitting: Physical Medicine and Rehabilitation

## 2022-06-01 ENCOUNTER — Encounter: Payer: Managed Care, Other (non HMO) | Admitting: Physical Therapy

## 2022-06-02 NOTE — Progress Notes (Signed)
Veida Ringuette Stidd - 52 y.o. female MRN [id number]  Date of birth: [birth year]  Office Visit Note: Visit Date: 05/27/2022 PCP: Carol Ada, MD Referred by: Carol Ada, MD  Subjective: Chief Complaint  Patient presents with   Lower Back - Pain   HPI:  JANELIZ RUNKLE is a 52 y.o. female who comes in todayfor planned radiofrequency ablation of the Bilateral L4-5 Lumbar facet joints. This would be ablation of the corresponding medial branches and/or dorsal rami.  Patient has had double diagnostic blocks with more than 50% relief.  These are documented on pain diary.  They have had chronic back pain for quite some time, more than 3 months, which has been an ongoing situation with recalcitrant axial back pain.  They have no radicular pain.  Their axial pain is worse with standing and ambulating and on exam today with facet loading.  They have had physical therapy as well as home exercise program.  The imaging noted in the chart below indicated facet pathology. Accordingly they meet all the criteria and qualification for for radiofrequency ablation and we are going to complete this today hopefully for more longer term relief as part of comprehensive management program.   ROS Otherwise per HPI.  Assessment & Plan: Visit Diagnoses:    ICD-10-CM   1. Spondylosis without myelopathy or radiculopathy, lumbar region  M47.816 XR C-ARM NO REPORT    Radiofrequency,Lumbar    methylPREDNISolone acetate (DEPO-MEDROL) injection 80 mg      Plan: No additional findings.   Meds & Orders:  Meds ordered this encounter  Medications   methylPREDNISolone acetate (DEPO-MEDROL) injection 80 mg    Orders Placed This Encounter  Procedures   Radiofrequency,Lumbar   XR C-ARM NO REPORT    Follow-up: Return for visit to requesting provider as needed.   Procedures: No procedures performed  Lumbar Facet Joint Nerve Denervation  Patient: TRISTANY ZACHERY      Date of Birth: [birth year] MRN:  [id number] PCP: Carol Ada, MD      Visit Date: 05/27/2022   Universal Protocol:    Date/Time: 03/20/241:41 PM  Consent Given By: the patient  Position: PRONE  Additional Comments: Vital signs were monitored before and after the procedure. Patient was prepped and draped in the usual sterile fashion. The correct patient, procedure, and site was verified.   Injection Procedure Details:   Procedure diagnoses:  1. Spondylosis without myelopathy or radiculopathy, lumbar region      Meds Administered:  Meds ordered this encounter  Medications   methylPREDNISolone acetate (DEPO-MEDROL) injection 80 mg     Laterality: Bilateral  Location/Site:  L4-L5, L3 and L4 medial branches  Needle: 20 ga.,  41mm active tip, 123mm RF Cannula  Needle Placement: Along juncture of superior articular process and transverse pocess  Findings:  -Comments:  Procedure Details: For each desired target nerve, the corresponding transverse process (sacral ala for the L5 dorsal rami) was identified and the fluoroscope was positioned to square off the endplates of the corresponding vertebral body to achieve a true AP midline view.  The beam was then obliqued 15 to 20 degrees and caudally tilted 15 to 20 degrees to line up a trajectory along the target nerves. The skin over the target of the junction of superior articulating process and transverse process (sacral ala for the L5 dorsal rami) was infiltrated with 33ml of 1% Lidocaine without Epinephrine.  The 18 gauge 8mm active tip outer cannula was advanced in trajectory view to the target.  This procedure was repeated for each target nerve.  Then, for all levels, the outer cannula placement was fine-tuned and the position was then confirmed with bi-planar imaging.    Test stimulation was done both at sensory and motor levels to ensure there was no radicular stimulation. The target tissues were then infiltrated with 1 ml of 1% Lidocaine without Epinephrine.  Subsequently, a percutaneous neurotomy was carried out for 90 seconds at 80 degrees Celsius.  After the completion of the lesion, 1 ml of injectate was delivered. It was then repeated for each facet joint nerve mentioned above. Appropriate radiographs were obtained to verify the probe placement during the neurotomy.   Additional Comments:  No complications occurred Dressing: 2 x 2 sterile gauze and Band-Aid    Post-procedure details: Patient was observed during the procedure. Post-procedure instructions were reviewed.  Patient left the clinic in stable condition.      Clinical History: MRI LUMBAR SPINE WITHOUT CONTRAST    TECHNIQUE:  Multiplanar, multisequence MR imaging of the lumbar spine was  performed. No intravenous contrast was administered.    COMPARISON:  MRI lumbar spine February 12, 2020.    FINDINGS:  Segmentation: Standard.    Alignment:  Normal.    Vertebrae: No focal marrow edema to suggest acute fracture or  discitis/osteomyelitis. No suspicious bone lesions.    Conus medullaris and cauda equina: Conus extends to the L1 level.  Conus and cauda equina appear normal.    Paraspinal and other soft tissues: Unremarkable.    Disc levels:    T12-L1: No significant disc protrusion, foraminal stenosis, or canal  stenosis.    L1-L2: Similar mild disc desiccation height loss. Similar mild disc  bulge without significant stenosis.    L2-L3: No significant disc protrusion, foraminal stenosis, or canal  stenosis.    L3-L4: No significant disc protrusion, foraminal stenosis, or canal  stenosis.    L4-L5: Similar slight disc bulging and bilateral facet arthropathy  with ligamentum flavum thickening. No significant canal or foraminal  stenosis.    L5-S1: Similar mild disc bulge with annular fissure. No significant  stenosis.    IMPRESSION:  Similar mild degenerative change at L1-L2, L4-L5, and L5-S1  (detailed above) without significant stenosis.       Electronically Signed    By: Margaretha Sheffield M.D.    On: 12/28/2020 14:42     Objective:  VS:  HT:    WT:   BMI:     BP:133/79  HR:93bpm  TEMP: ( )  RESP:  Physical Exam Vitals and nursing note reviewed.  Constitutional:      General: She is not in acute distress.    Appearance: Normal appearance. She is obese. She is not ill-appearing.  HENT:     Head: Normocephalic and atraumatic.     Right Ear: External ear normal.     Left Ear: External ear normal.  Eyes:     Extraocular Movements: Extraocular movements intact.  Cardiovascular:     Rate and Rhythm: Normal rate.     Pulses: Normal pulses.  Pulmonary:     Effort: Pulmonary effort is normal. No respiratory distress.  Abdominal:     General: There is no distension.     Palpations: Abdomen is soft.  Musculoskeletal:        General: Tenderness present.     Cervical back: Neck supple.     Right lower leg: No edema.     Left lower leg: No edema.  Comments: Patient has good distal strength with no pain over the greater trochanters.  No clonus or focal weakness.  Skin:    Findings: No erythema, lesion or rash.  Neurological:     General: No focal deficit present.     Mental Status: She is alert and oriented to person, place, and time.     Sensory: No sensory deficit.     Motor: No weakness or abnormal muscle tone.     Coordination: Coordination normal.  Psychiatric:        Mood and Affect: Mood normal.        Behavior: Behavior normal.      Imaging: No results found.

## 2022-06-02 NOTE — Procedures (Signed)
Lumbar Facet Joint Nerve Denervation  [name]      Date of Birth: June 25, 1970 MRN: [id number] PCP: Carol Ada, MD      Visit Date: 05/27/2022   Universal Protocol:    Date/Time: 03/20/241:41 PM  Consent Given By: the patient  Position: PRONE  Additional Comments: Vital signs were monitored before and after the procedure. Patient was prepped and draped in the usual sterile fashion. The correct patient, procedure, and site was verified.   Injection Procedure Details:   Procedure diagnoses:  1. Spondylosis without myelopathy or radiculopathy, lumbar region      Meds Administered:  Meds ordered this encounter  Medications   methylPREDNISolone acetate (DEPO-MEDROL) injection 80 mg     Laterality: Bilateral  Location/Site:  L4-L5, L3 and L4 medial branches  Needle: 20 ga.,  58mm active tip, 163mm RF Cannula  Needle Placement: Along juncture of superior articular process and transverse pocess  Findings:  -Comments:  Procedure Details: For each desired target nerve, the corresponding transverse process (sacral ala for the L5 dorsal rami) was identified and the fluoroscope was positioned to square off the endplates of the corresponding vertebral body to achieve a true AP midline view.  The beam was then obliqued 15 to 20 degrees and caudally tilted 15 to 20 degrees to line up a trajectory along the target nerves. The skin over the target of the junction of superior articulating process and transverse process (sacral ala for the L5 dorsal rami) was infiltrated with 56ml of 1% Lidocaine without Epinephrine.  The 18 gauge 69mm active tip outer cannula was advanced in trajectory view to the target.  This procedure was repeated for each target nerve.  Then, for all levels, the outer cannula placement was fine-tuned and the position was then confirmed with bi-planar imaging.    Test stimulation was done both at sensory and motor levels to ensure there was no  radicular stimulation. The target tissues were then infiltrated with 1 ml of 1% Lidocaine without Epinephrine. Subsequently, a percutaneous neurotomy was carried out for 90 seconds at 80 degrees Celsius.  After the completion of the lesion, 1 ml of injectate was delivered. It was then repeated for each facet joint nerve mentioned above. Appropriate radiographs were obtained to verify the probe placement during the neurotomy.   Additional Comments:  No complications occurred Dressing: 2 x 2 sterile gauze and Band-Aid    Post-procedure details: Patient was observed during the procedure. Post-procedure instructions were reviewed.  Patient left the clinic in stable condition.

## 2022-06-04 ENCOUNTER — Encounter: Payer: Self-pay | Admitting: Physical Therapy

## 2022-06-04 ENCOUNTER — Ambulatory Visit (INDEPENDENT_AMBULATORY_CARE_PROVIDER_SITE_OTHER): Payer: Managed Care, Other (non HMO) | Admitting: Physical Therapy

## 2022-06-04 DIAGNOSIS — M25551 Pain in right hip: Secondary | ICD-10-CM

## 2022-06-04 DIAGNOSIS — M25512 Pain in left shoulder: Secondary | ICD-10-CM

## 2022-06-04 DIAGNOSIS — M546 Pain in thoracic spine: Secondary | ICD-10-CM

## 2022-06-04 DIAGNOSIS — R262 Difficulty in walking, not elsewhere classified: Secondary | ICD-10-CM

## 2022-06-04 DIAGNOSIS — M25552 Pain in left hip: Secondary | ICD-10-CM

## 2022-06-04 DIAGNOSIS — M6281 Muscle weakness (generalized): Secondary | ICD-10-CM | POA: Diagnosis not present

## 2022-06-04 DIAGNOSIS — M5459 Other low back pain: Secondary | ICD-10-CM | POA: Diagnosis not present

## 2022-06-04 NOTE — Therapy (Signed)
OUTPATIENT PHYSICAL THERAPY TREATMENT NOTE    Patient Name: Emily Hester MRN: [id number] DOB:1971/01/27, 52 y.o., female Today's Date: 06/04/2022  END OF SESSION:   PT End of Session - 06/04/22 1104     Visit Number 5    Number of Visits 8    Date for PT Re-Evaluation 06/17/22    Authorization Type [employer] and Medicare    Progress Note Due on Visit 10    PT Start Time 1015    PT Stop Time 1100    PT Time Calculation (min) 45 min    Activity Tolerance Patient tolerated treatment well    Behavior During Therapy WFL for tasks assessed/performed                Past Medical History:  Diagnosis Date   Arthritis    patient denies   Asthma    Diabetes mellitus without complication ([place])    type    GERD (gastroesophageal reflux disease)    Hypertension    Interstitial cystitis    IUD (intrauterine device) in place placed 6 weeks ago   Migraines    MIgraines- not current     Pneumonia    Past Surgical History:  Procedure Laterality Date   ANTERIOR HIP REVISION Right 05/20/2020   Procedure: RIGHT TOTAL HIP REVISION- ploy exhange and hip ball exchange with bone graft;  Surgeon: Mcarthur Rossetti, MD;  Location: [facility];  Service: Orthopedics;  Laterality: Right;   DILATION AND EVACUATION  09/30/2011   Procedure: DILATATION AND EVACUATION;  Surgeon: Allena Katz, MD;  Location: [facility];  Service: Gynecology;  Laterality: N/A;   left knee surgery      arthroscopy    TOTAL HIP ARTHROPLASTY Right 03/11/2017   Procedure: RIGHT TOTAL HIP ARTHROPLASTY ANTERIOR APPROACH;  Surgeon: Mcarthur Rossetti, MD;  Location: [facility];  Service: Orthopedics;  Laterality: Right;   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[place]) 05/20/2020   Status post revision of total hip 05/20/2020   Trochanteric bursitis, right hip 09/05/2019   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   Unilateral primary osteoarthritis, right  hip 01/27/2017     THERAPY DIAG:  Other low back pain  Pain in thoracic spine  Muscle weakness (generalized)  Difficulty in walking, not elsewhere classified  Pain in right hip  Pain in left hip  Acute pain of left shoulder   PCP: Carol Ada, MD  REFERRING PROVIDER: Mcarthur Rossetti, MD/[place], [place], [place]  REFERRING DIAG: M70.61 (ICD-10-CM) - Trochanteric bursitis, right hip M70.62 (ICD-10-CM) - Trochanteric bursitis, left hip  Rationale for Evaluation and Treatment: Rehabilitation  EVAL THERAPY DIAG:  Other low back pain - Plan: PT plan of care cert/re-cert  Pain in thoracic spine - Plan: PT plan of care cert/re-cert  Muscle weakness (generalized) - Plan: PT plan of care cert/re-cert  Difficulty in walking, not elsewhere classified - Plan: PT plan of care cert/re-cert  Pain in right hip - Plan: PT plan of care cert/re-cert  Pain in left hip - Plan: PT plan of care cert/re-cert  Acute pain of left shoulder - Plan: PT plan of care cert/re-cert  ONSET DATE: Chronic  SUBJECTIVE:  SUBJECTIVE STATEMENT: She says no real improvement yet after RFA  PERTINENT HISTORY:  Arthritis, asthma, DM, HTN, interstitial cystitis, migraines, hx PNA, Rt THA with revision  PAIN:  Are you having pain? Yes: NPRS scale: 7 currently in back low back Pain location: low back and bil hips, radiates to Rt foot Pain description: reports tingling in ant Rt shin and foot, burning and stinging in the back, aching and throbbing in hips Aggravating factors: laying on side, walking longer distances (grocery store), standing to wash dishes Relieving factors: sit and get comfortable, medication for pain  PRECAUTIONS: None  WEIGHT BEARING RESTRICTIONS: No  FALLS:  Has patient fallen in last 6 months?  No  LIVING ENVIRONMENT: Lives with: lives with their family (husband and 71 y/o daughter) - helping with daughter Lives in: House/apartment Stairs: Yes: Internal: 85 (to get to basement with laundry) steps; on right going up and External: 5 steps; on right going up; must use step to pattern Has following equipment at home: Single point cane, Walker - 2 wheeled, and Crutches  OCCUPATION: on disability  PLOF: Independent and Leisure: make jewelry  PATIENT GOALS: improve pain   NEXT MD VISIT: 04/06/22  OBJECTIVE:   DIAGNOSTIC FINDINGS:  Lumbar MRI imaging from 2022 exhibits mild degenerative change at L1-L2, L4-L5, and L5-S1, facet arthropathy more prominent bilaterally at level of L4-L5. No stenosis/nerve impingement noted.  PATIENT SURVEYS:  04/05/22: FOTO 33 (predicted 44)  SCREENING FOR RED FLAGS: Bowel or bladder incontinence: No Spinal tumors: No Cauda equina syndrome: No  COGNITION: Overall cognitive status: Within functional limits for tasks assessed     SENSATION: 04/05/22: WFL  MUSCLE LENGTH: 04/05/22: tightness in Rt hamstring and gastroc  POSTURE:  04/05/22: rounded shoulders, forward head, and increased lumbar lordosis  PALPATION: 04/05/22: very tender to bil greater trochanters; glutes  LUMBAR ROM:   AROM eval  Flexion Limited 75%  Extension Limited 25% (Rt lean)  Right Quadrant WNL with pain  Left Quadrant WNL with pain   (Blank rows = not tested)  UPPER EXTREMITY ROM:  Active ROM Left 04/12/22  Shoulder flexion 155  Shoulder extension   Shoulder abduction 120  Shoulder adduction   Shoulder extension   Shoulder internal rotation FIR to Lt glute  Shoulder external rotation WFL   (Blank rows = not tested)   UPPER EXTREMITY MMT:  MMT Left 04/12/22  Shoulder flexion 4/5  Shoulder extension   Shoulder abduction 4/5  Shoulder adduction   Shoulder extension   Shoulder internal rotation 5/5  Shoulder external rotation 3+/5  (Blank rows = not  tested)   SHOULDER SPECIAL TESTS: 04/12/22 Impingement tests: Hawkins/Kennedy impingement test: positive  (Lt) Rotator cuff assessment: Empty can test: positive  and Full can test: negative (positive on Lt) Biceps assessment:  no popeye deformity   LOWER EXTREMITY MMT:    MMT Right eval Left eval  Hip flexion 3/5 (give way weakness) 4/5  Hip extension    Hip abduction 4/5 with pain 4/5  Hip adduction    Hip internal rotation    Hip external rotation    Knee flexion 5/5 5/5  Knee extension 5/5 5/5  Ankle dorsiflexion    Ankle plantarflexion    Ankle inversion    Ankle eversion     (Blank rows = not tested)  LUMBAR SPECIAL TESTS:  04/05/22: Slump test: Negative  GAIT: 04/05/22: Distance walked: 150' within clinic Assistive device utilized: None Level of assistance: Modified independence Comments: decreased speed, antalgic gait  TODAY'S  TREATMENT:                                                                                                                              DATE:  06/04/22 Pt seen for aquatic therapy today.  Treatment took place in water 3.5-4.75 ft in depth at the [facility] pool. Temp of water was 91.  Pt entered/exited the pool via stairs with rail.  Pt requires the buoyancy and hydrostatic pressure of water for support, and to offload joints by unweighting joint load by at least 50 % in navel deep water and by at least 75-80% in chest to neck deep water.  Viscosity of the water is needed for resistance of strengthening. Water current perturbations provides challenge to standing balance requiring increased core activation.  -Forward Walking horizontal width of pool 15 feet at 3.6 ft dept X 3 round trips -Side Walking horizontal width of pool 15 feet at 3.6 ft dept X 3 round trips  -Retro walking horizontal width of pool 15 feet at 3.6 ft dept X 3 round trips   -March walking horizontal width of pool 15 feet at 3.6 ft dept X 3 round trips holding  pool noodle into shoulder flexion 90 deg -Leg swings flexion/extension X 15 bilat one UE support -Leg swings abd/add X 15 bilat one UE support -Forward walking pushing with kickboard down and walking back backwards pulling against kickboard 5 round trips horizontal width of pool 15 feet at 3.6 ft dept X 3 round trips -Chest press/rows using kickboard for additional resistance 2 X 15 in staggerd stance. -Seated on bench in water, bycycle kicks 1 min  -Seated on bench in water hip add/add scissor X 1 min -Sit to stand on bench in water X 10, feet on 5 inch step -Standing shoulder adduction/abduction with bilat water dumbells 2X10 -Standing 3 way lunge holding water dumbells X 5 each vector bilat -Hops while holding on to edge of pool in 4 feet depth, abd/add X 15 -Staggerd stance alternating shoulder flexion/extension 2X10 with DB -Float with noodle anterior under arms 3 minutes for spinal decompression  05/20/22 TherEx UBE UE/LEs L3 x 6 min Lateral step ups with contralateral hip abduction 2x10 bil; light UE support needed Squats 2x10 Dead lifts with 10# KB 2x10 Seated SLR x10 bil Sit to/from stand without UE support x 10 reps   05/05/22 TherEx UBE UE/LEs L3 x 6 min Cable Rows 20# 3x10 Lat pull downs 20# 2x10 (mild increase in pain following) Lt shoulder flexion and abduction 2x10 each; 1# Standing ER/IR with L3 band 2x10 Quadruped hip extension x 10 reps bil Single knee to chest 3x30 sec bil Piriformis stretch 3x30 sec bil (knee to opposite shoulder) Bridges x 10 reps  04/12/22 TherEx NuStep L5 x 8 min Standing rows L3 band 2x10; 3 sec hold Standing shoulder IR/ER L3 band 2x10 (Trial of reactive isometrics for ER - difficulty with maintaining position) Standing Lt shoulder abduction to  90 deg; 1# 2x10 Shoulder assessment performed (see above for details)  04/05/22  See HEP - performed trial reps PRN for comprehension   PATIENT EDUCATION:  Education details: HEP Person  educated: Patient Education method: Explanation, Demonstration, and Handouts Education comprehension: verbalized understanding, returned demonstration, and needs further education  HOME EXERCISE PROGRAM: Access Code: [email or web address] URL: [email or web address] Date: 05/20/2022 Prepared by: Faustino Congress  Exercises - Hooklying Single Knee to Chest  - 2 x daily - 7 x weekly - 1 sets - 3 reps - 30 sec hold - Supine Piriformis Stretch with Foot on Ground  - 2 x daily - 7 x weekly - 1 sets - 3 reps - 30 sec hold - Sidelying Hip Circles  - 2 x daily - 7 x weekly - 1 sets - 10 reps - Supine Bridge  - 2 x daily - 7 x weekly - 1 sets - 10 reps - 5 sec hold - Shoulder Internal Rotation with Resistance  - 1 x daily - 7 x weekly - 2 sets - 10 reps - Shoulder External Rotation with Anchored Resistance  - 1 x daily - 7 x weekly - 2 sets - 10 reps - Shoulder External Rotation Reactive Isometrics  - 1 x daily - 7 x weekly - 2 sets - 10 reps - Standing Shoulder Row with Anchored Resistance  - 1 x daily - 7 x weekly - 2 sets - 10 reps - 5 sec hold - Standing Single Arm Shoulder Abduction with Dumbbell - Thumb Up  - 1 x daily - 7 x weekly - 2 sets - 10 reps - Lateral Step Up  - 1 x daily - 7 x weekly - 2-3 sets - 10 reps - Squat  - 1 x daily - 7 x weekly - 2-3 sets - 10 reps - Half Deadlift with Kettlebell  - 1 x daily - 7 x weekly - 2-3 sets - 10 reps  ASSESSMENT:  CLINICAL IMPRESSION: She had her first aquatic PT session and had good overall tolerance to program today. We will assess her response to this next session on land and she can set up more aquatic visits then if she thought the water therapy was helpful.  Will continue to benefit from PT to maximize function.   OBJECTIVE IMPAIRMENTS: Abnormal gait, decreased activity tolerance, decreased endurance, decreased mobility, difficulty walking, decreased strength, increased fascial restrictions, increased muscle spasms, impaired flexibility,  postural dysfunction, and pain.   ACTIVITY LIMITATIONS: carrying, lifting, bending, sitting, standing, squatting, sleeping, stairs, transfers, locomotion level, and caring for others  PARTICIPATION LIMITATIONS: meal prep, cleaning, laundry, driving, shopping, community activity, and yard work  PERSONAL FACTORS: 3+ comorbidities: Arthritis, asthma, DM, HTN, interstitial cystitis, migraines, hx PNA, Rt THA with revision are also affecting patient's functional outcome.   REHAB POTENTIAL: Good  CLINICAL DECISION MAKING: Evolving/moderate complexity  EVALUATION COMPLEXITY: Moderate   GOALS: Goals reviewed with patient? Yes  SHORT TERM GOALS: Target date: 04/26/2022  Independent with initial HEP Goal status: MET 05/05/22   LONG TERM GOALS: Target date: 06/17/2022   Independent with final HEP Goal status: ONGOING 05/20/22  2.  FOTO score improved to 44 Goal status: ONGOING 05/20/22  3.  Rt hip strength improved to 4+/5 for improved function and mobility Goal status: ONGOING 05/20/22  4.  Report pain < 5/10 with standing and walking > 20 min for improved function Goal status: ONGOING 05/20/22  5.  Demonstrate improved functional strength by negotiating stairs reciprocally with single handrail  Goal status: INITIAL  6.  Demonstrate at least 4/5 Lt shoulder strength for improved function  Goal status: ONGOING 05/20/22  7.  Improved Lt shoulder FIR to at least T8 for improved UB dressing  Goal status: ONGOING 05/20/22    PLAN:  PT FREQUENCY: 1x/week  PT DURATION: 4 weeks  PLANNED INTERVENTIONS: Therapeutic exercises, Therapeutic activity, Neuromuscular re-education, Balance training, Gait training, Patient/Family education, Self Care, Joint mobilization, Stair training, DME instructions, Aquatic Therapy, Dry Needling, Electrical stimulation, Spinal manipulation, Spinal mobilization, Cryotherapy, Moist heat, Taping, Traction, Ultrasound, Ionotophoresis 4mg /ml Dexamethasone, Manual therapy,  and Re-evaluation.  PLAN FOR NEXT SESSION: aquatic therapy for strengthening, review HEP (low back, hips and shoulder HEP) and progress as able, manual/modalities PRN   Elsie Ra, PT, DPT 06/04/22 11:08 AM

## 2022-06-08 ENCOUNTER — Encounter: Payer: Managed Care, Other (non HMO) | Admitting: Physical Therapy

## 2022-06-14 ENCOUNTER — Encounter: Payer: Self-pay | Admitting: Physical Therapy

## 2022-06-14 ENCOUNTER — Telehealth: Payer: Self-pay | Admitting: Physical Therapy

## 2022-06-14 NOTE — Telephone Encounter (Signed)
I called Emily Hester back and we scheduled her 2 more aquatic PT sessions for now.  Elsie Ra, PT, DPT 06/14/22 9:05 AM

## 2022-06-18 ENCOUNTER — Ambulatory Visit (INDEPENDENT_AMBULATORY_CARE_PROVIDER_SITE_OTHER): Payer: Managed Care, Other (non HMO) | Admitting: Physical Therapy

## 2022-06-18 ENCOUNTER — Encounter: Payer: Self-pay | Admitting: Physical Therapy

## 2022-06-18 DIAGNOSIS — M6281 Muscle weakness (generalized): Secondary | ICD-10-CM

## 2022-06-18 DIAGNOSIS — R262 Difficulty in walking, not elsewhere classified: Secondary | ICD-10-CM | POA: Diagnosis not present

## 2022-06-18 DIAGNOSIS — M25512 Pain in left shoulder: Secondary | ICD-10-CM

## 2022-06-18 DIAGNOSIS — M25552 Pain in left hip: Secondary | ICD-10-CM

## 2022-06-18 DIAGNOSIS — M546 Pain in thoracic spine: Secondary | ICD-10-CM | POA: Diagnosis not present

## 2022-06-18 DIAGNOSIS — M5459 Other low back pain: Secondary | ICD-10-CM | POA: Diagnosis not present

## 2022-06-18 DIAGNOSIS — M25551 Pain in right hip: Secondary | ICD-10-CM

## 2022-06-18 NOTE — Therapy (Signed)
OUTPATIENT PHYSICAL THERAPY TREATMENT NOTE    Patient Name: Emily Hester MRN: [id number] DOB:11-Nov-1970, 52 y.o., female Today's Date: 06/18/2022  END OF SESSION:   PT End of Session - 06/18/22 0929     Visit Number 6    Number of Visits 8    Date for PT Re-Evaluation 06/17/22    Authorization Type [employer] and Medicare    Progress Note Due on Visit 10    PT Start Time 0925    PT Stop Time 1010    PT Time Calculation (min) 45 min    Activity Tolerance Patient tolerated treatment well    Behavior During Therapy WFL for tasks assessed/performed                Past Medical History:  Diagnosis Date   Arthritis    patient denies   Asthma    Diabetes mellitus without complication    type    GERD (gastroesophageal reflux disease)    Hypertension    Interstitial cystitis    IUD (intrauterine device) in place placed 6 weeks ago   Migraines    MIgraines- not current     Pneumonia    Past Surgical History:  Procedure Laterality Date   ANTERIOR HIP REVISION Right 05/20/2020   Procedure: RIGHT TOTAL HIP REVISION- ploy exhange and hip ball exchange with bone graft;  Surgeon: Kathryne Hitch, MD;  Location: [facility];  Service: Orthopedics;  Laterality: Right;   DILATION AND EVACUATION  09/30/2011   Procedure: DILATATION AND EVACUATION;  Surgeon: Anastacia Andrea, MD;  Location: [facility];  Service: Gynecology;  Laterality: N/A;   left knee surgery      arthroscopy    TOTAL HIP ARTHROPLASTY Right 03/11/2017   Procedure: RIGHT TOTAL HIP ARTHROPLASTY ANTERIOR APPROACH;  Surgeon: Kathryne Hitch, MD;  Location: [facility];  Service: Orthopedics;  Laterality: Right;   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HCC) 05/20/2020   Status post revision of total hip 05/20/2020   Trochanteric bursitis, right hip 09/05/2019   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   Unilateral primary osteoarthritis, right hip  01/27/2017     THERAPY DIAG:  Other low back pain  Pain in thoracic spine  Muscle weakness (generalized)  Difficulty in walking, not elsewhere classified  Pain in right hip  Pain in left hip  Acute pain of left shoulder   PCP: Merri Brunette, MD  REFERRING PROVIDER: Kathryne Hitch, MD/[place], [place], [place]  REFERRING DIAG: M70.61 (ICD-10-CM) - Trochanteric bursitis, right hip M70.62 (ICD-10-CM) - Trochanteric bursitis, left hip  Rationale for Evaluation and Treatment: Rehabilitation  EVAL THERAPY DIAG:  Other low back pain - Plan: PT plan of care cert/re-cert  Pain in thoracic spine - Plan: PT plan of care cert/re-cert  Muscle weakness (generalized) - Plan: PT plan of care cert/re-cert  Difficulty in walking, not elsewhere classified - Plan: PT plan of care cert/re-cert  Pain in right hip - Plan: PT plan of care cert/re-cert  Pain in left hip - Plan: PT plan of care cert/re-cert  Acute pain of left shoulder - Plan: PT plan of care cert/re-cert  ONSET DATE: Chronic  SUBJECTIVE:  SUBJECTIVE STATEMENT: She says she did not have any increased pain after last aquatic PT session, feels PT can progress just a little bit  PERTINENT HISTORY:  Arthritis, asthma, DM, HTN, interstitial cystitis, migraines, hx PNA, Rt THA with revision  PAIN:  Are you having pain? Yes: NPRS scale: 7 currently in back low back Pain location: low back and bil hips, radiates to Rt foot Pain description: reports tingling in ant Rt shin and foot, burning and stinging in the back, aching and throbbing in hips Aggravating factors: laying on side, walking longer distances (grocery store), standing to wash dishes Relieving factors: sit and get comfortable, medication for pain  PRECAUTIONS: None  WEIGHT BEARING  RESTRICTIONS: No  FALLS:  Has patient fallen in last 6 months? No  LIVING ENVIRONMENT: Lives with: lives with their family (husband and 60 y/o daughter) - helping with daughter Lives in: House/apartment Stairs: Yes: Internal: 14 (to get to basement with laundry) steps; on right going up and External: 5 steps; on right going up; must use step to pattern Has following equipment at home: Single point cane, Walker - 2 wheeled, and Crutches  OCCUPATION: on disability  PLOF: Independent and Leisure: make jewelry  PATIENT GOALS: improve pain   NEXT MD VISIT: 04/06/22  OBJECTIVE:   DIAGNOSTIC FINDINGS:  Lumbar MRI imaging from 2022 exhibits mild degenerative change at L1-L2, L4-L5, and L5-S1, facet arthropathy more prominent bilaterally at level of L4-L5. No stenosis/nerve impingement noted.  PATIENT SURVEYS:  04/05/22: FOTO 33 (predicted 44)  SCREENING FOR RED FLAGS: Bowel or bladder incontinence: No Spinal tumors: No Cauda equina syndrome: No  COGNITION: Overall cognitive status: Within functional limits for tasks assessed     SENSATION: 04/05/22: WFL  MUSCLE LENGTH: 04/05/22: tightness in Rt hamstring and gastroc  POSTURE:  04/05/22: rounded shoulders, forward head, and increased lumbar lordosis  PALPATION: 04/05/22: very tender to bil greater trochanters; glutes  LUMBAR ROM:   AROM eval  Flexion Limited 75%  Extension Limited 25% (Rt lean)  Right Quadrant WNL with pain  Left Quadrant WNL with pain   (Blank rows = not tested)  UPPER EXTREMITY ROM:  Active ROM Left 04/12/22  Shoulder flexion 155  Shoulder extension   Shoulder abduction 120  Shoulder adduction   Shoulder extension   Shoulder internal rotation FIR to Lt glute  Shoulder external rotation WFL   (Blank rows = not tested)   UPPER EXTREMITY MMT:  MMT Left 04/12/22  Shoulder flexion 4/5  Shoulder extension   Shoulder abduction 4/5  Shoulder adduction   Shoulder extension   Shoulder internal  rotation 5/5  Shoulder external rotation 3+/5  (Blank rows = not tested)   SHOULDER SPECIAL TESTS: 04/12/22 Impingement tests: Hawkins/Kennedy impingement test: positive  (Lt) Rotator cuff assessment: Empty can test: positive  and Full can test: negative (positive on Lt) Biceps assessment:  no popeye deformity   LOWER EXTREMITY MMT:    MMT Right eval Left eval  Hip flexion 3/5 (give way weakness) 4/5  Hip extension    Hip abduction 4/5 with pain 4/5  Hip adduction    Hip internal rotation    Hip external rotation    Knee flexion 5/5 5/5  Knee extension 5/5 5/5  Ankle dorsiflexion    Ankle plantarflexion    Ankle inversion    Ankle eversion     (Blank rows = not tested)  LUMBAR SPECIAL TESTS:  04/05/22: Slump test: Negative  GAIT: 04/05/22: Distance walked: 150' within clinic Assistive device  utilized: None Level of assistance: Modified independence Comments: decreased speed, antalgic gait  TODAY'S TREATMENT:                                                                                                                              DATE:  06/18/22 Pt seen for aquatic therapy today.  Treatment took place in water 3.5-4.75 ft in depth at the [facility] pool. Temp of water was 91.  Pt entered/exited the pool via stairs with rail.  Pt requires the buoyancy and hydrostatic pressure of water for support, and to offload joints by unweighting joint load by at least 50 % in navel deep water and by at least 75-80% in chest to neck deep water.  Viscosity of the water is needed for resistance of strengthening. Water current perturbations provides challenge to standing balance requiring increased core activation.  -Forward Walking horizontal width of pool 15 feet  X 3 round trips -Side Walking horizontal width of pool X 3 round trips  -Retro walking horizontal width of pool  X 3 round trips   -March walking horizontal width of pool 15 feet X 3 round trips holding pool noodle  into shoulder flexion 90 deg -Leg swings flexion/extension X 12 bilat one UE support -Leg swings abd/add X 20 bilat one UE support -Forward walking pushing with kickboard down and walking back backwards pulling against kickboard 5 round trips horizontal width of pool 15 feet X 3 round trips -Chest press/rows using kickboard for additional resistance 2 X 15 in staggerd stance. -Shoulder extension using kickboard resistance X 20 -Sidestepping with mini squat and shoulder Abd/add using water dumbells 3 round trips horizontal across pool -Seated on bench in water, bycycle kicks 1 min  -Seated on bench in water hip add/add scissor X 1 min -Sit to stand on bench in water X 10, feet on 5 inch step -Standing 3 way lunge holding water dumbells X 8 each vector bilat -Hops while holding on to edge of pool in 4 feet depth, abd/add X 15 -Back against pool wall in 1/4 squat performing alternating hip flexion kicks X 10 bilat -Back against the wall hip extensions with green noodle under thigh X 10 bilat -Float with noodle anterior under arms 3 minutes for spinal decompression  06/04/22 Pt seen for aquatic therapy today.  Treatment took place in water 3.5-4.75 ft in depth at the [facility] pool. Temp of water was 91.  Pt entered/exited the pool via stairs with rail.  Pt requires the buoyancy and hydrostatic pressure of water for support, and to offload joints by unweighting joint load by at least 50 % in navel deep water and by at least 75-80% in chest to neck deep water.  Viscosity of the water is needed for resistance of strengthening. Water current perturbations provides challenge to standing balance requiring increased core activation.  -Forward Walking horizontal width of pool 15 feet at 3.6 ft dept X 3 round trips -Side  Walking horizontal width of pool 15 feet at 3.6 ft dept X 3 round trips  -Retro walking horizontal width of pool 15 feet at 3.6 ft dept X 3 round trips   -March walking  horizontal width of pool 15 feet at 3.6 ft dept X 3 round trips holding pool noodle into shoulder flexion 90 deg -Leg swings flexion/extension X 15 bilat one UE support -Leg swings abd/add X 15 bilat one UE support -Forward walking pushing with kickboard down and walking back backwards pulling against kickboard 5 round trips horizontal width of pool 15 feet at 3.6 ft dept X 3 round trips -Chest press/rows using kickboard for additional resistance 2 X 15 in staggerd stance. -Seated on bench in water, bycycle kicks 1 min  -Seated on bench in water hip add/add scissor X 1 min -Seated revers crunch on bench in water X 30 sec -Sit to stand on bench in water X 15, feet on 5 inch step -Standing shoulder adduction/abduction with bilat water dumbells 2X10 -Standing 3 way lunge holding water dumbells X 5 each vector bilat -Hops while holding on to edge of pool in 4 feet depth, abd/add X 20 and flexion/extension X 20 -Staggerd stance alternating shoulder flexion/extension 2X10 with DB -Float with noodle anterior under arms 3 minutes for spinal decompression in deep end corner of pool  05/20/22 TherEx UBE UE/LEs L3 x 6 min Lateral step ups with contralateral hip abduction 2x10 bil; light UE support needed Squats 2x10 Dead lifts with 10# KB 2x10 Seated SLR x10 bil Sit to/from stand without UE support x 10 reps   05/05/22 TherEx UBE UE/LEs L3 x 6 min Cable Rows 20# 3x10 Lat pull downs 20# 2x10 (mild increase in pain following) Lt shoulder flexion and abduction 2x10 each; 1# Standing ER/IR with L3 band 2x10 Quadruped hip extension x 10 reps bil Single knee to chest 3x30 sec bil Piriformis stretch 3x30 sec bil (knee to opposite shoulder) Bridges x 10 reps    PATIENT EDUCATION:  Education details: HEP Person educated: Patient Education method: Programmer, multimediaxplanation, Facilities managerDemonstration, and Handouts Education comprehension: verbalized understanding, returned demonstration, and needs further education  HOME  EXERCISE PROGRAM: Access Code: [email or web address] URL: [email or web address] Date: 05/20/2022 Prepared by: Moshe CiproStephanie Matthews  Exercises - Hooklying Single Knee to Chest  - 2 x daily - 7 x weekly - 1 sets - 3 reps - 30 sec hold - Supine Piriformis Stretch with Foot on Ground  - 2 x daily - 7 x weekly - 1 sets - 3 reps - 30 sec hold - Sidelying Hip Circles  - 2 x daily - 7 x weekly - 1 sets - 10 reps - Supine Bridge  - 2 x daily - 7 x weekly - 1 sets - 10 reps - 5 sec hold - Shoulder Internal Rotation with Resistance  - 1 x daily - 7 x weekly - 2 sets - 10 reps - Shoulder External Rotation with Anchored Resistance  - 1 x daily - 7 x weekly - 2 sets - 10 reps - Shoulder External Rotation Reactive Isometrics  - 1 x daily - 7 x weekly - 2 sets - 10 reps - Standing Shoulder Row with Anchored Resistance  - 1 x daily - 7 x weekly - 2 sets - 10 reps - 5 sec hold - Standing Single Arm Shoulder Abduction with Dumbbell - Thumb Up  - 1 x daily - 7 x weekly - 2 sets - 10 reps - Lateral Step Up  - 1 x  daily - 7 x weekly - 2-3 sets - 10 reps - Squat  - 1 x daily - 7 x weekly - 2-3 sets - 10 reps - Half Deadlift with Kettlebell  - 1 x daily - 7 x weekly - 2-3 sets - 10 reps  ASSESSMENT:  CLINICAL IMPRESSION: She had good tolerance to exercise intensity in water last visit so we progressed this today at her request. We will assess her response to this level of intensity and adjust as appropriate. She Will continue to benefit from PT to maximize function.   OBJECTIVE IMPAIRMENTS: Abnormal gait, decreased activity tolerance, decreased endurance, decreased mobility, difficulty walking, decreased strength, increased fascial restrictions, increased muscle spasms, impaired flexibility, postural dysfunction, and pain.   ACTIVITY LIMITATIONS: carrying, lifting, bending, sitting, standing, squatting, sleeping, stairs, transfers, locomotion level, and caring for others  PARTICIPATION LIMITATIONS: meal prep,  cleaning, laundry, driving, shopping, community activity, and yard work  PERSONAL FACTORS: 3+ comorbidities: Arthritis, asthma, DM, HTN, interstitial cystitis, migraines, hx PNA, Rt THA with revision are also affecting patient's functional outcome.   REHAB POTENTIAL: Good  CLINICAL DECISION MAKING: Evolving/moderate complexity  EVALUATION COMPLEXITY: Moderate   GOALS: Goals reviewed with patient? Yes  SHORT TERM GOALS: Target date: 04/26/2022  Independent with initial HEP Goal status: MET 05/05/22   LONG TERM GOALS: Target date: 06/17/2022   Independent with final HEP Goal status: ONGOING 05/20/22  2.  FOTO score improved to 44 Goal status: ONGOING 05/20/22  3.  Rt hip strength improved to 4+/5 for improved function and mobility Goal status: ONGOING 05/20/22  4.  Report pain < 5/10 with standing and walking > 20 min for improved function Goal status: ONGOING 05/20/22  5.  Demonstrate improved functional strength by negotiating stairs reciprocally with single handrail Goal status: INITIAL  6.  Demonstrate at least 4/5 Lt shoulder strength for improved function  Goal status: ONGOING 05/20/22  7.  Improved Lt shoulder FIR to at least T8 for improved UB dressing  Goal status: ONGOING 05/20/22    PLAN:  PT FREQUENCY: 1x/week  PT DURATION: 4 weeks  PLANNED INTERVENTIONS: Therapeutic exercises, Therapeutic activity, Neuromuscular re-education, Balance training, Gait training, Patient/Family education, Self Care, Joint mobilization, Stair training, DME instructions, Aquatic Therapy, Dry Needling, Electrical stimulation, Spinal manipulation, Spinal mobilization, Cryotherapy, Moist heat, Taping, Traction, Ultrasound, Ionotophoresis 4mg /ml Dexamethasone, Manual therapy, and Re-evaluation.  PLAN FOR NEXT SESSION: aquatic therapy for strengthening, review HEP (low back, hips and shoulder HEP) and progress as able, manual/modalities PRN   Ivery QualeBrian Sabastian Raimondi, PT, DPT 06/18/22 9:29 AM

## 2022-06-25 ENCOUNTER — Encounter: Payer: Self-pay | Admitting: Allergy

## 2022-06-25 ENCOUNTER — Other Ambulatory Visit: Payer: Self-pay

## 2022-06-25 ENCOUNTER — Ambulatory Visit: Payer: Managed Care, Other (non HMO) | Admitting: Physical Therapy

## 2022-06-25 ENCOUNTER — Ambulatory Visit (INDEPENDENT_AMBULATORY_CARE_PROVIDER_SITE_OTHER): Payer: Managed Care, Other (non HMO) | Admitting: Allergy

## 2022-06-25 VITALS — BP 148/80 | HR 84 | Temp 98.7°F | Resp 16 | Wt 259.8 lb

## 2022-06-25 DIAGNOSIS — H1013 Acute atopic conjunctivitis, bilateral: Secondary | ICD-10-CM

## 2022-06-25 DIAGNOSIS — T7800XD Anaphylactic reaction due to unspecified food, subsequent encounter: Secondary | ICD-10-CM

## 2022-06-25 DIAGNOSIS — J3089 Other allergic rhinitis: Secondary | ICD-10-CM

## 2022-06-25 DIAGNOSIS — J4520 Mild intermittent asthma, uncomplicated: Secondary | ICD-10-CM | POA: Diagnosis not present

## 2022-06-25 MED ORDER — MONTELUKAST SODIUM 10 MG PO TABS: 10.0000 mg | ORAL_TABLET | Freq: Every day | ORAL | 1 refills | Status: DC

## 2022-06-25 MED ORDER — BUDESONIDE-FORMOTEROL FUMARATE 160-4.5 MCG/ACT IN AERO: 2.0000 | INHALATION_SPRAY | Freq: Two times a day (BID) | RESPIRATORY_TRACT | 5 refills | Status: DC

## 2022-06-25 MED ORDER — IPRATROPIUM BROMIDE 0.06 % NA SOLN: NASAL | 1 refills | Status: DC

## 2022-06-25 MED ORDER — ALBUTEROL SULFATE HFA 108 (90 BASE) MCG/ACT IN AERS: 2.0000 | INHALATION_SPRAY | RESPIRATORY_TRACT | 1 refills | Status: DC | PRN

## 2022-06-25 NOTE — Patient Instructions (Addendum)
Asthma - continue Symbicort 2 puffs twice a day with spacer to help prevent cough and wheeze.   - continue Singulair (montelukast) 10mg  nightly - have access to albuterol inhaler 2 puffs every 4-6 hours as needed for cough/wheeze/shortness of breath/chest tightness.  May use 15-20 minutes prior to activity.   Monitor frequency of use.  - recommend use of your albuterol prior to outdoor activity, grilling etc - continue to do your best to avoid fragrance products Asthma control goals:  Full participation in all desired activities (may need albuterol before activity) Albuterol use two time or less a week on average (not counting use with activity) Cough interfering with sleep two time or less a month Oral steroids no more than once a year No hospitalizations  Allergic Rhinitis with conjunctivitis - continue allergen avoidance measures - continue Zyrtec (Cetirizine) 10 mg daily  - continue Singulair (montelukast) nightly - for nasal drainage/post-nasal drip use nasal Atrovent 0.06% 2 sprays each nostril up to 3-4 times a day as needed.   Recommend using in evening/prior to bedtime.  - use over-the-counter Pataday Xtra Strength, Pataday 1 drop each eye daily or Zaditor or Alaway 1 drop each eye twice a day as needed for itchy/watery/red eyes.   These are all over-the-counter allergy eyedrop you can use.   Food allergy - continue to avoid peanuts and tree nuts - in case of an allergic reaction, give Benadryl 4 teaspoonfuls every 4 hours, and if life-threatening symptoms occur, inject with EpiPen 0.3 mg  Follow-up in 6 months or sooner if needed

## 2022-06-25 NOTE — Progress Notes (Signed)
Follow-up Note  RE: Emily Hester MRN: [id number] DOB: 05-May-1970 Date of Office Visit: 06/25/2022   History of present illness: Emily Hester is a 52 y.o. female presenting today for follow-up of asthma, allergic rhinitis with conjunctivitis and food allergy.  She was last seen in the office on 12/25/2021 by myself.    She is throat clearing more but that is about it that she is noticing with her alleriges.  She feels like at this point her allergies have been doing okay.  She states she likely should start back using the nasal spray when she needs a refill of.  I did prescribe her nasal Atrovent last visit to help with nasal drainage control. She states she has been coughing at night that is sporadic and maybe heard some wheezing last night but did not use albuterol.  She is currently taking Zyrtec and Singulair daily.  She has not needed to use any eyedrops.  She also continues to use her Symbicort taking 2 puffs twice a day.  She has not required any ED or urgent care visits or any systemic steroid needs. She has not needed to use her epinephrine device that she does continue to avoid peanuts and tree nuts in her diet.   Review of systems: Review of Systems  Constitutional: Negative.   HENT:         See HPI  Eyes: Negative.   Respiratory:         See HPI  Cardiovascular: Negative.   Gastrointestinal: Negative.   Musculoskeletal: Negative.   Skin: Negative.   Allergic/Immunologic: Negative.   Neurological: Negative.      All other systems negative unless noted above in HPI  Past medical/social/surgical/family history have been reviewed and are unchanged unless specifically indicated below.  No changes  Medication List: Current Outpatient Medications  Medication Sig Dispense Refill   acetaminophen (TYLENOL) 500 MG tablet Take 1,000 mg by mouth every 6 (six) hours as needed for moderate pain.     albuterol (PROVENTIL) (2.5 MG/3ML) 0.083% nebulizer solution  Take 3 mLs (2.5 mg total) by nebulization every 4 (four) hours as needed for wheezing or shortness of breath. 75 mL 1   amLODipine (NORVASC) 10 MG tablet Take 10 mg by mouth every morning.     aspirin-acetaminophen-caffeine (EXCEDRIN MIGRAINE) 250-250-65 MG tablet Take 2 tablets by mouth every 6 (six) hours as needed for headache.     cetirizine (ZYRTEC) 10 MG tablet Take 1 tablet (10 mg total) by mouth daily. 90 tablet 1   Cholecalciferol (VITAMIN D) 50 MCG (2000 UT) tablet Take 2,000 Units by mouth daily.     clindamycin (CLEOCIN) 150 MG capsule Take 600 mg by mouth See admin instructions. Take 600 mg 1 hour prior to dental work     diazepam (VALIUM) 5 MG tablet Take 1 by mouth 1 hour  pre-procedure with very light food. May bring 2nd tablet to appointment. 2 tablet 0   diphenhydrAMINE (BENADRYL) 25 MG tablet Take 25 mg by mouth daily as needed for allergies.     EPINEPHrine 0.3 mg/0.3 mL IJ SOAJ injection Inject 0.3 mg into the muscle as directed.     gabapentin (NEURONTIN) 100 MG capsule Take 1 capsule (100 mg total) by mouth at bedtime. 30 capsule 0   HYDROcodone-acetaminophen (NORCO) 7.5-325 MG tablet Take 1 tablet by mouth 3 (three) times daily as needed for moderate pain. 30 tablet 0   ibuprofen (ADVIL) 200 MG tablet Take 600 mg by  mouth every 6 (six) hours as needed for headache or moderate pain.     Lancets (ONETOUCH DELICA PLUS LANCET33G) MISC Apply 1 each topically daily.     levonorgestrel (MIRENA) 20 MCG/24HR IUD 1 each by Intrauterine route once.     lidocaine (LIDODERM) 5 % Place 1 patch onto the skin daily as needed (pain). Remove & Discard patch within 12 hours or as directed by MD 30 patch 3   losartan (COZAAR) 25 MG tablet Take 25 mg by mouth daily.  0   metFORMIN (GLUCOPHAGE-XR) 750 MG 24 hr tablet Take 750 mg by mouth 2 (two) times daily.     nystatin cream (MYCOSTATIN) APPLY EXTERNALLY TWICE A DAY AS NEEDED 30     olopatadine (PATANOL) 0.1 % ophthalmic solution       omeprazole (PRILOSEC) 20 MG capsule Take 1 capsule (20 mg total) by mouth every morning. (Patient taking differently: Take 20 mg by mouth daily.) 30 capsule 5   ondansetron (ZOFRAN) 8 MG tablet Take 8 mg by mouth every 8 (eight) hours as needed for nausea or vomiting.     ONETOUCH VERIO test strip as directed.   3   rosuvastatin (CRESTOR) 10 MG tablet Take 10 mg by mouth daily.  3   Semaglutide, 2 MG/DOSE, (OZEMPIC, 2 MG/DOSE,) 8 MG/3ML SOPN Inject 2 mg into the skin once a week.     Spacer/Aero-Holding Chambers DEVI Take 1 each by mouth as directed. 1 each 0   tiZANidine (ZANAFLEX) 4 MG tablet TAKE 1 TABLET BY MOUTH EVERY 6 HOURS AS NEEDED FOR MUSCLE SPASMS. 360 tablet 1   traMADol (ULTRAM) 50 MG tablet Take 1 tablet by mouth every 6 (six) hours as needed.     Vibegron (GEMTESA PO) Take by mouth daily.     albuterol (VENTOLIN HFA) 108 (90 Base) MCG/ACT inhaler Inhale 2 puffs into the lungs every 4 (four) hours as needed. 18 g 1   budesonide-formoterol (SYMBICORT) 160-4.5 MCG/ACT inhaler Inhale 2 puffs into the lungs 2 (two) times daily. 1 each 5   ipratropium (ATROVENT) 0.06 % nasal spray Apply 2 sprays each nostril up to 3-4 times a day as needed 45 mL 1   montelukast (SINGULAIR) 10 MG tablet Take 1 tablet (10 mg total) by mouth at bedtime. 90 tablet 1   No current facility-administered medications for this visit.     Known medication allergies: Allergies  Allergen Reactions   Amoxicillin Hives, Swelling and Other (See Comments)    Has patient had a PCN reaction causing immediate rash, facial/tongue/throat swelling, SOB or lightheadedness with hypotension: Yes Has patient had a PCN reaction causing severe rash involving mucus membranes or skin necrosis: No Has patient had a PCN reaction that required hospitalization: Yes Has patient had a PCN reaction occurring within the last 10 years: No  If all of the above answers are "NO", then may proceed with Cephalosporin use.    Lisinopril  Itching and Cough    Heart racing    Olmesartan     jittery   Bactrim Rash     Physical examination: Blood pressure (!) 148/80, pulse 84, temperature 98.7 F (37.1 C), temperature source Temporal, resp. rate 16, weight 259 lb 12.8 oz (117.8 kg), SpO2 96 %.  General: Alert, interactive, in no acute distress. HEENT: PERRLA, TMs pearly gray, turbinates minimally edematous without discharge, post-pharynx non erythematous positive cobblestoning, 2+ tonsils symmetrical. Neck: Supple without lymphadenopathy. Lungs: Clear to auscultation without wheezing, rhonchi or rales. {no increased work  of breathing. CV: Normal S1, S2 without murmurs. Abdomen: Nondistended, nontender. Skin: Warm and dry, without lesions or rashes. Extremities:  No clubbing, cyanosis or edema. Neuro:   Grossly intact.  Diagnositics/Labs:  Spirometry: FEV1: 2.08L 86%, FVC: 2.52L 83%, ratio consistent with nonobstructive pattern  Assessment and plan: Asthma -Believe current nighttime cough is most likely related to postnasal drainage and she will start using her nasal spray as below. - continue Symbicort 2 puffs twice a day with spacer to help prevent cough and wheeze.   - continue Singulair (montelukast)  nightly - have access to albuterol inhaler 2 puffs every 4-6 hours as needed for cough/wheeze/shortness of breath/chest tightness.  May use 15-20 minutes prior to activity.   Monitor frequency of use.  - recommend use of your albuterol prior to outdoor activity, grilling etc - continue to do your best to avoid fragrance products Asthma control goals:  Full participation in all desired activities (may need albuterol before activity) Albuterol use two time or less a week on average (not counting use with activity) Cough interfering with sleep two time or less a month Oral steroids no more than once a year No hospitalizations  Allergic Rhinitis with conjunctivitis - continue allergen avoidance  measures - continue Zyrtec (Cetirizine) 10 mg daily  - continue Singulair (montelukast) nightly - for nasal drainage/post-nasal drip use nasal Atrovent 0.06% 2 sprays each nostril up to 3-4 times a day as needed.   Recommend using in evening/prior to bedtime.  - use over-the-counter Pataday Xtra Strength, Pataday 1 drop each eye daily or Zaditor or Alaway 1 drop each eye twice a day as needed for itchy/watery/red eyes.   These are all over-the-counter allergy eyedrop you can use.   Food allergy - continue to avoid peanuts and tree nuts - in case of an allergic reaction, give Benadryl 4 teaspoonfuls every 4 hours, and if life-threatening symptoms occur, inject with EpiPen 0.3 mg  Follow-up in 6 months or sooner if needed  I appreciate the opportunity to take part in Oanh's care. Please do not hesitate to contact me with questions.  Sincerely,   Margo Aye, MD Allergy/Immunology [facility]

## 2022-07-02 ENCOUNTER — Ambulatory Visit (INDEPENDENT_AMBULATORY_CARE_PROVIDER_SITE_OTHER): Payer: Managed Care, Other (non HMO) | Admitting: Physical Therapy

## 2022-07-02 ENCOUNTER — Encounter: Payer: Self-pay | Admitting: Physical Therapy

## 2022-07-02 DIAGNOSIS — M25552 Pain in left hip: Secondary | ICD-10-CM

## 2022-07-02 DIAGNOSIS — M25512 Pain in left shoulder: Secondary | ICD-10-CM | POA: Diagnosis not present

## 2022-07-02 DIAGNOSIS — M62830 Muscle spasm of back: Secondary | ICD-10-CM

## 2022-07-02 DIAGNOSIS — M5441 Lumbago with sciatica, right side: Secondary | ICD-10-CM | POA: Diagnosis not present

## 2022-07-02 DIAGNOSIS — M25551 Pain in right hip: Secondary | ICD-10-CM | POA: Diagnosis not present

## 2022-07-02 DIAGNOSIS — G8929 Other chronic pain: Secondary | ICD-10-CM

## 2022-07-02 NOTE — Therapy (Addendum)
OUTPATIENT PHYSICAL THERAPY TREATMENT NOTE/Recert    Patient Name: Emily Hester MRN: [id number] DOB:12/22/1970, 52 y.o., female Today's Date: 07/02/2022  END OF SESSION:   PT End of Session - 07/02/22 0813     Visit Number 7    Number of Visits 12    Date for PT Re-Evaluation 08/13/22    Authorization Type [employer] and Medicare    Progress Note Due on Visit 10    PT Start Time 0800    PT Stop Time 0841    PT Time Calculation (min) 41 min    Activity Tolerance Patient tolerated treatment well    Behavior During Therapy WFL for tasks assessed/performed                Past Medical History:  Diagnosis Date   Arthritis    patient denies   Asthma    Diabetes mellitus without complication    type    GERD (gastroesophageal reflux disease)    Hypertension    Interstitial cystitis    IUD (intrauterine device) in place placed 6 weeks ago   Migraines    MIgraines- not current     Pneumonia    Past Surgical History:  Procedure Laterality Date   ANTERIOR HIP REVISION Right 05/20/2020   Procedure: RIGHT TOTAL HIP REVISION- ploy exhange and hip ball exchange with bone graft;  Surgeon: Kathryne Hitch, MD;  Location: [facility];  Service: Orthopedics;  Laterality: Right;   DILATION AND EVACUATION  09/30/2011   Procedure: DILATATION AND EVACUATION;  Surgeon: Chelcy Andrea, MD;  Location: [facility];  Service: Gynecology;  Laterality: N/A;   left knee surgery      arthroscopy    TOTAL HIP ARTHROPLASTY Right 03/11/2017   Procedure: RIGHT TOTAL HIP ARTHROPLASTY ANTERIOR APPROACH;  Surgeon: Kathryne Hitch, MD;  Location: [facility];  Service: Orthopedics;  Laterality: Right;   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HCC) 05/20/2020   Status post revision of total hip 05/20/2020   Trochanteric bursitis, right hip 09/05/2019   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   Unilateral primary osteoarthritis,  right hip 01/27/2017     THERAPY DIAG:  Pain in right hip  Pain in left hip  Acute pain of left shoulder  Chronic bilateral low back pain with right-sided sciatica  Muscle spasm of back   PCP: Merri Brunette, MD  REFERRING PROVIDER: Kathryne Hitch, MD/[place], [place], [place]  REFERRING DIAG: M70.61 (ICD-10-CM) - Trochanteric bursitis, right hip M70.62 (ICD-10-CM) - Trochanteric bursitis, left hip  Rationale for Evaluation and Treatment: Rehabilitation  EVAL THERAPY DIAG:  Other low back pain - Plan: PT plan of care cert/re-cert  Pain in thoracic spine - Plan: PT plan of care cert/re-cert  Muscle weakness (generalized) - Plan: PT plan of care cert/re-cert  Difficulty in walking, not elsewhere classified - Plan: PT plan of care cert/re-cert  Pain in right hip - Plan: PT plan of care cert/re-cert  Pain in left hip - Plan: PT plan of care cert/re-cert  Acute pain of left shoulder - Plan: PT plan of care cert/re-cert  ONSET DATE: Chronic  SUBJECTIVE:  SUBJECTIVE STATEMENT: She says feels a little off balance today side to side, pain is overall okay today  PERTINENT HISTORY:  Arthritis, asthma, DM, HTN, interstitial cystitis, migraines, hx PNA, Rt THA with revision  PAIN:  Are you having pain? Yes: NPRS scale: 6 currently in back low back Pain location: low back and bil hips, radiates to Rt foot Pain description: reports tingling in ant Rt shin and foot, burning and stinging in the back, aching and throbbing in hips Aggravating factors: laying on side, walking longer distances (grocery store), standing to wash dishes Relieving factors: sit and get comfortable, medication for pain  PRECAUTIONS: None  WEIGHT BEARING RESTRICTIONS: No  FALLS:  Has patient fallen in last 6 months?  No  LIVING ENVIRONMENT: Lives with: lives with their family (husband and 30 y/o daughter) - helping with daughter Lives in: House/apartment Stairs: Yes: Internal: 14 (to get to basement with laundry) steps; on right going up and External: 5 steps; on right going up; must use step to pattern Has following equipment at home: Single point cane, Walker - 2 wheeled, and Crutches  OCCUPATION: on disability  PLOF: Independent and Leisure: make jewelry  PATIENT GOALS: improve pain   NEXT MD VISIT: 04/06/22  OBJECTIVE:   DIAGNOSTIC FINDINGS:  Lumbar MRI imaging from 2022 exhibits mild degenerative change at L1-L2, L4-L5, and L5-S1, facet arthropathy more prominent bilaterally at level of L4-L5. No stenosis/nerve impingement noted.  PATIENT SURVEYS:  04/05/22: FOTO 33 (predicted 44) 07/02/22: FOTO 49% and met goal  SCREENING FOR RED FLAGS: Bowel or bladder incontinence: No Spinal tumors: No Cauda equina syndrome: No  COGNITION: Overall cognitive status: Within functional limits for tasks assessed     SENSATION: 04/05/22: WFL  MUSCLE LENGTH: 04/05/22: tightness in Rt hamstring and gastroc  POSTURE:  04/05/22: rounded shoulders, forward head, and increased lumbar lordosis  PALPATION: 04/05/22: very tender to bil greater trochanters; glutes  LUMBAR ROM:   AROM eval  Flexion Limited 75%  Extension Limited 25% (Rt lean)  Right Quadrant WNL with pain  Left Quadrant WNL with pain   (Blank rows = not tested)  UPPER EXTREMITY ROM:  Active ROM Left 04/12/22  Shoulder flexion 155  Shoulder extension   Shoulder abduction 120  Shoulder adduction   Shoulder extension   Shoulder internal rotation FIR to Lt glute  Shoulder external rotation WFL   (Blank rows = not tested)   UPPER EXTREMITY MMT:  MMT Left 04/12/22  Shoulder flexion 4/5  Shoulder extension   Shoulder abduction 4/5  Shoulder adduction   Shoulder extension   Shoulder internal rotation 5/5  Shoulder external  rotation 3+/5  (Blank rows = not tested)   SHOULDER SPECIAL TESTS: 04/12/22 Impingement tests: Hawkins/Kennedy impingement test: positive  (Lt) Rotator cuff assessment: Empty can test: positive  and Full can test: negative (positive on Lt) Biceps assessment:  no popeye deformity   LOWER EXTREMITY MMT:    MMT Right eval Left eval  Hip flexion 3/5 (give way weakness) 4/5  Hip extension    Hip abduction 4/5 with pain 4/5  Hip adduction    Hip internal rotation    Hip external rotation    Knee flexion 5/5 5/5  Knee extension 5/5 5/5  Ankle dorsiflexion    Ankle plantarflexion    Ankle inversion    Ankle eversion     (Blank rows = not tested)  LUMBAR SPECIAL TESTS:  04/05/22: Slump test: Negative  GAIT: 04/05/22: Distance walked: 150' within clinic Assistive device  utilized: None Level of assistance: Modified independence Comments: decreased speed, antalgic gait  TODAY'S TREATMENT:                                                                                                                              DATE:  07/02/22 Pt seen for aquatic therapy today.  Treatment took place in water 3.5-4.75 ft in depth at the [facility] pool. Temp of water was 91.  Pt entered/exited the pool via stairs with rail.  Pt requires the buoyancy and hydrostatic pressure of water for support, and to offload joints by unweighting joint load by at least 50 % in navel deep water and by at least 75-80% in chest to neck deep water.  Viscosity of the water is needed for resistance of strengthening. Water current perturbations provides challenge to standing balance requiring increased core activation.  -Forward Walking horizontal width of pool 15 feet  X 3 round trips -Side Walking horizontal width of pool X 3 round trips  -Retro walking horizontal width of pool  X 3 round trips   -March walking horizontal width of pool 15 feet X 3 round trips  -Leg swings flexion/extension X 20 bilat one UE  support -Leg swings abd/add X 20 bilat one UE support -Forward walking pushing with kickboard down and walking back backwards pulling against kickboard 5 round trips horizontal width of pool 15 feet X 3 round trips -Shoulder extension/flexion yoga X 20 -Sidestepping with mini squat and shoulder Abd/add  3 round trips horizontal across pool -Step ups onto first step X 15 reps bilat no UE support -Squats using handrails on first step X 15 reps -Standing 3 way lunge holding water dumbells X 8 each vector bilat -Hops while holding on to edge of pool in 4 feet depth, abd/add X 20 and march hops X 20 - -Float with noodle anterior under arms 3 minutes for spinal decompression while taking FOTO survey  06/18/22 Pt seen for aquatic therapy today.  Treatment took place in water 3.5-4.75 ft in depth at the [facility] pool. Temp of water was 91.  Pt entered/exited the pool via stairs with rail.  Pt requires the buoyancy and hydrostatic pressure of water for support, and to offload joints by unweighting joint load by at least 50 % in navel deep water and by at least 75-80% in chest to neck deep water.  Viscosity of the water is needed for resistance of strengthening. Water current perturbations provides challenge to standing balance requiring increased core activation.  -Forward Walking horizontal width of pool 15 feet  X 3 round trips -Side Walking horizontal width of pool X 3 round trips  -Retro walking horizontal width of pool  X 3 round trips   -March walking horizontal width of pool 15 feet X 3 round trips holding pool noodle into shoulder flexion 90 deg -Leg swings flexion/extension X 12 bilat one UE support -Leg swings abd/add X 20 bilat one UE  support -Forward walking pushing with kickboard down and walking back backwards pulling against kickboard 5 round trips horizontal width of pool 15 feet X 3 round trips -Chest press/rows using kickboard for additional resistance 2 X 15 in staggerd  stance. -Shoulder extension using kickboard resistance X 20 -Sidestepping with mini squat and shoulder Abd/add using water dumbells 3 round trips horizontal across pool -Seated on bench in water, bycycle kicks 1 min  -Seated on bench in water hip add/add scissor X 1 min -Sit to stand on bench in water X 10, feet on 5 inch step -Standing 3 way lunge holding water dumbells X 8 each vector bilat -Hops while holding on to edge of pool in 4 feet depth, abd/add X 15 -Back against pool wall in 1/4 squat performing alternating hip flexion kicks X 10 bilat -Back against the wall hip extensions with green noodle under thigh X 10 bilat -Float with noodle anterior under arms 3 minutes for spinal decompression  06/04/22 Pt seen for aquatic therapy today.  Treatment took place in water 3.5-4.75 ft in depth at the [facility] pool. Temp of water was 91.  Pt entered/exited the pool via stairs with rail.  Pt requires the buoyancy and hydrostatic pressure of water for support, and to offload joints by unweighting joint load by at least 50 % in navel deep water and by at least 75-80% in chest to neck deep water.  Viscosity of the water is needed for resistance of strengthening. Water current perturbations provides challenge to standing balance requiring increased core activation.  -Forward Walking horizontal width of pool 15 feet at 3.6 ft dept X 3 round trips -Side Walking horizontal width of pool 15 feet at 3.6 ft dept X 3 round trips  -Retro walking horizontal width of pool 15 feet at 3.6 ft dept X 3 round trips   -March walking horizontal width of pool 15 feet at 3.6 ft dept X 3 round trips holding pool noodle into shoulder flexion 90 deg -Leg swings flexion/extension X 15 bilat one UE support -Leg swings abd/add X 15 bilat one UE support -Forward walking pushing with kickboard down and walking back backwards pulling against kickboard 5 round trips horizontal width of pool 15 feet at 3.6 ft dept X 3  round trips -Chest press/rows using kickboard for additional resistance 2 X 15 in staggerd stance. -Seated on bench in water, bycycle kicks 1 min  -Seated on bench in water hip add/add scissor X 1 min -Seated revers crunch on bench in water X 30 sec -Sit to stand on bench in water X 15, feet on 5 inch step -Standing shoulder adduction/abduction with bilat water dumbells 2X10 -Standing 3 way lunge holding water dumbells X 5 each vector bilat -Hops while holding on to edge of pool in 4 feet depth, abd/add X 20 and flexion/extension X 20 -Staggerd stance alternating shoulder flexion/extension 2X10 with DB -Float with noodle anterior under arms 3 minutes for spinal decompression in deep end corner of pool  05/20/22 [employer]Ex UBE UE/LEs L3 x 6 min Lateral step ups with contralateral hip abduction 2x10 bil; light UE support needed Squats 2x10 Dead lifts with 10# KB 2x10 Seated SLR x10 bil Sit to/from stand without UE support x 10 reps   05/05/22 TherEx UBE UE/LEs L3 x 6 min Cable Rows 20# 3x10 Lat pull downs 20# 2x10 (mild increase in pain following) Lt shoulder flexion and abduction 2x10 each; 1# Standing ER/IR with L3 band 2x10 Quadruped hip extension x 10 reps  bil Single knee to chest 3x30 sec bil Piriformis stretch 3x30 sec bil (knee to opposite shoulder) Bridges x 10 reps    PATIENT EDUCATION:  Education details: HEP Person educated: Patient Education method: Programmer, multimedia, Facilities manager, and Handouts Education comprehension: verbalized understanding, returned demonstration, and needs further education  HOME EXERCISE PROGRAM: Access Code: [email or web address] URL: [email or web address] Date: 05/20/2022 Prepared by: Moshe Cipro  Exercises - Hooklying Single Knee to Chest  - 2 x daily - 7 x weekly - 1 sets - 3 reps - 30 sec hold - Supine Piriformis Stretch with Foot on Ground  - 2 x daily - 7 x weekly - 1 sets - 3 reps - 30 sec hold - Sidelying Hip Circles  - 2 x  daily - 7 x weekly - 1 sets - 10 reps - Supine Bridge  - 2 x daily - 7 x weekly - 1 sets - 10 reps - 5 sec hold - Shoulder Internal Rotation with Resistance  - 1 x daily - 7 x weekly - 2 sets - 10 reps - Shoulder External Rotation with Anchored Resistance  - 1 x daily - 7 x weekly - 2 sets - 10 reps - Shoulder External Rotation Reactive Isometrics  - 1 x daily - 7 x weekly - 2 sets - 10 reps - Standing Shoulder Row with Anchored Resistance  - 1 x daily - 7 x weekly - 2 sets - 10 reps - 5 sec hold - Standing Single Arm Shoulder Abduction with Dumbbell - Thumb Up  - 1 x daily - 7 x weekly - 2 sets - 10 reps - Lateral Step Up  - 1 x daily - 7 x weekly - 2-3 sets - 10 reps - Squat  - 1 x daily - 7 x weekly - 2-3 sets - 10 reps - Half Deadlift with Kettlebell  - 1 x daily - 7 x weekly - 2-3 sets - 10 reps  ASSESSMENT:  CLINICAL IMPRESSION: Recert today has her PT plan has expired. I would recommend a few more weeks of PT as she has not yet met her PT goals and we have began water PT and would recommend she come a few more times to get independent with aquatic HEP. She did meet her FOTO functional goal at least.   OBJECTIVE IMPAIRMENTS: Abnormal gait, decreased activity tolerance, decreased endurance, decreased mobility, difficulty walking, decreased strength, increased fascial restrictions, increased muscle spasms, impaired flexibility, postural dysfunction, and pain.   ACTIVITY LIMITATIONS: carrying, lifting, bending, sitting, standing, squatting, sleeping, stairs, transfers, locomotion level, and caring for others  PARTICIPATION LIMITATIONS: meal prep, cleaning, laundry, driving, shopping, community activity, and yard work  PERSONAL FACTORS: 3+ comorbidities: Arthritis, asthma, DM, HTN, interstitial cystitis, migraines, hx PNA, Rt THA with revision are also affecting patient's functional outcome.   REHAB POTENTIAL: Good  CLINICAL DECISION MAKING: Evolving/moderate complexity  EVALUATION  COMPLEXITY: Moderate   GOALS: Goals reviewed with patient? Yes  SHORT TERM GOALS: Target date: 04/26/2022  Independent with initial HEP Goal status: MET 05/05/22   LONG TERM GOALS: Target date: 08/13/2022   Independent with final HEP Goal status: ONGOING, will add aquatic HEP,  07/02/22  2.  FOTO score improved to 44 Goal status: MET 07/02/22  3.  Rt hip strength improved to 4+/5 for improved function and mobility Goal status: ONGOING 07/02/22  4.  Report pain < 5/10 with standing and walking > 20 min for improved function Goal status: ONGOING 07/02/22  5.  Demonstrate improved functional strength  by negotiating stairs reciprocally with single handrail Goal status: ongoing 07/02/22  6.  Demonstrate at least 4/5 Lt shoulder strength for improved function  Goal status: ONGOING 34/19/24 7.  Improved Lt shoulder FIR to at least T8 for improved UB dressing  Goal status: ONGOING 07/02/22    PLAN:  PT FREQUENCY: 1x/week  PT DURATION: 4 weeks  PLANNED INTERVENTIONS: Therapeutic exercises, Therapeutic activity, Neuromuscular re-education, Balance training, Gait training, Patient/Family education, Self Care, Joint mobilization, Stair training, DME instructions, Aquatic Therapy, Dry Needling, Electrical stimulation, Spinal manipulation, Spinal mobilization, Cryotherapy, Moist heat, Taping, Traction, Ultrasound, Ionotophoresis 4mg /ml Dexamethasone, Manual therapy, and Re-evaluation.  PLAN FOR NEXT SESSION: aquatic therapy for strengthening, review HEP (low back, hips and shoulder HEP) and progress as able, manual/modalities PRN   Ivery Quale, PT, DPT 07/02/22 8:14 AM  PHYSICAL THERAPY DISCHARGE SUMMARY  Visits from Start of Care: 7  Current functional level related to goals / functional outcomes: See above   Remaining deficits: See above   Education / Equipment: HEP  Plan:  Patient goals were not all met. Patient is being discharged due to not returning since last visit  >30 days Ivery Quale, PT, DPT 08/05/22 1:23 PM

## 2022-07-04 ENCOUNTER — Other Ambulatory Visit: Payer: Self-pay | Admitting: Orthopaedic Surgery

## 2022-07-05 MED ORDER — HYDROCODONE-ACETAMINOPHEN 7.5-325 MG PO TABS: 1.0000 | ORAL_TABLET | Freq: Three times a day (TID) | ORAL | 0 refills | Status: DC | PRN

## 2022-07-15 ENCOUNTER — Encounter: Payer: Self-pay | Admitting: Physical Therapy

## 2022-07-19 ENCOUNTER — Emergency Department (HOSPITAL_COMMUNITY): Payer: Managed Care, Other (non HMO)

## 2022-07-19 ENCOUNTER — Other Ambulatory Visit: Payer: Self-pay

## 2022-07-19 ENCOUNTER — Emergency Department (HOSPITAL_COMMUNITY)
Admission: EM | Admit: 2022-07-19 | Discharge: 2022-07-19 | Disposition: A | Discharge status: Home / Self Care | Payer: Managed Care, Other (non HMO) | Attending: Emergency Medicine | Admitting: Emergency Medicine

## 2022-07-19 DIAGNOSIS — Z7982 Long term (current) use of aspirin: Secondary | ICD-10-CM | POA: Insufficient documentation

## 2022-07-19 DIAGNOSIS — Z7951 Long term (current) use of inhaled steroids: Secondary | ICD-10-CM | POA: Diagnosis not present

## 2022-07-19 DIAGNOSIS — R2990 Unspecified symptoms and signs involving the nervous system: Secondary | ICD-10-CM | POA: Diagnosis not present

## 2022-07-19 DIAGNOSIS — G43E01 Chronic migraine with aura, not intractable, with status migrainosus: Secondary | ICD-10-CM | POA: Diagnosis not present

## 2022-07-19 DIAGNOSIS — Z7984 Long term (current) use of oral hypoglycemic drugs: Secondary | ICD-10-CM | POA: Insufficient documentation

## 2022-07-19 DIAGNOSIS — Z79899 Other long term (current) drug therapy: Secondary | ICD-10-CM | POA: Diagnosis not present

## 2022-07-19 DIAGNOSIS — G43809 Other migraine, not intractable, without status migrainosus: Secondary | ICD-10-CM

## 2022-07-19 DIAGNOSIS — R29810 Facial weakness: Secondary | ICD-10-CM | POA: Diagnosis not present

## 2022-07-19 DIAGNOSIS — R4781 Slurred speech: Secondary | ICD-10-CM | POA: Diagnosis not present

## 2022-07-19 DIAGNOSIS — R519 Headache, unspecified: Secondary | ICD-10-CM | POA: Diagnosis present

## 2022-07-19 LAB — DIFFERENTIAL
Abs Immature Granulocytes: 0.03 10*3/uL (ref 0.00–0.07)
Basophils Absolute: 0 10*3/uL (ref 0.0–0.1)
Basophils Relative: 0 %
Eosinophils Absolute: 0.1 10*3/uL (ref 0.0–0.5)
Eosinophils Relative: 1 %
Immature Granulocytes: 0 %
Lymphocytes Relative: 36 %
Lymphs Abs: 3.4 10*3/uL (ref 0.7–4.0)
Monocytes Absolute: 0.7 10*3/uL (ref 0.1–1.0)
Monocytes Relative: 7 %
Neutro Abs: 5.3 10*3/uL (ref 1.7–7.7)
Neutrophils Relative %: 56 %

## 2022-07-19 LAB — PROTIME-INR
INR: 1 (ref 0.8–1.2)
Prothrombin Time: 12.7 seconds (ref 11.4–15.2)

## 2022-07-19 LAB — URINALYSIS, ROUTINE W REFLEX MICROSCOPIC
Bilirubin Urine: NEGATIVE
Glucose, UA: NEGATIVE mg/dL
Hgb urine dipstick: NEGATIVE
Ketones, ur: NEGATIVE mg/dL
Leukocytes,Ua: NEGATIVE
Nitrite: NEGATIVE
Protein, ur: NEGATIVE mg/dL
Specific Gravity, Urine: 1.008 (ref 1.005–1.030)
pH: 7 (ref 5.0–8.0)

## 2022-07-19 LAB — POCT I-STAT EG7
Acid-Base Excess: 2 mmol/L (ref 0.0–2.0)
Bicarbonate: 26.6 mmol/L (ref 20.0–28.0)
Calcium, Ion: 1.18 mmol/L (ref 1.15–1.40)
HCT: 43 % (ref 36.0–46.0)
Hemoglobin: 14.6 g/dL (ref 12.0–15.0)
O2 Saturation: 50 %
Potassium: 3.7 mmol/L (ref 3.5–5.1)
Sodium: 139 mmol/L (ref 135–145)
TCO2: 28 mmol/L (ref 22–32)
pCO2, Ven: 42.5 mmHg — ABNORMAL LOW (ref 44–60)
pH, Ven: 7.404 (ref 7.25–7.43)
pO2, Ven: 27 mmHg — CL (ref 32–45)

## 2022-07-19 LAB — CBC
HCT: 41.6 % (ref 36.0–46.0)
Hemoglobin: 13.9 g/dL (ref 12.0–15.0)
MCH: 27.7 pg (ref 26.0–34.0)
MCHC: 33.4 g/dL (ref 30.0–36.0)
MCV: 83 fL (ref 80.0–100.0)
Platelets: 287 10*3/uL (ref 150–400)
RBC: 5.01 MIL/uL (ref 3.87–5.11)
RDW: 16.9 % — ABNORMAL HIGH (ref 11.5–15.5)
WBC: 9.5 10*3/uL (ref 4.0–10.5)
nRBC: 0 % (ref 0.0–0.2)

## 2022-07-19 LAB — RAPID URINE DRUG SCREEN, HOSP PERFORMED
Amphetamines: NOT DETECTED
Barbiturates: NOT DETECTED
Benzodiazepines: NOT DETECTED
Cocaine: NOT DETECTED
Opiates: POSITIVE — AB
Tetrahydrocannabinol: NOT DETECTED

## 2022-07-19 LAB — POCT I-STAT, CHEM 8
BUN: 7 mg/dL (ref 6–20)
Calcium, Ion: 1.19 mmol/L (ref 1.15–1.40)
Chloride: 104 mmol/L (ref 98–111)
Creatinine, Ser: 0.6 mg/dL (ref 0.44–1.00)
Glucose, Bld: 141 mg/dL — ABNORMAL HIGH (ref 70–99)
HCT: 44 % (ref 36.0–46.0)
Hemoglobin: 15 g/dL (ref 12.0–15.0)
Potassium: 3.7 mmol/L (ref 3.5–5.1)
Sodium: 139 mmol/L (ref 135–145)
TCO2: 25 mmol/L (ref 22–32)

## 2022-07-19 LAB — COMPREHENSIVE METABOLIC PANEL
ALT: 20 U/L (ref 0–44)
AST: 21 U/L (ref 15–41)
Albumin: 3.8 g/dL (ref 3.5–5.0)
Alkaline Phosphatase: 112 U/L (ref 38–126)
Anion gap: 9 (ref 5–15)
BUN: 7 mg/dL (ref 6–20)
CO2: 25 mmol/L (ref 22–32)
Calcium: 9.2 mg/dL (ref 8.9–10.3)
Chloride: 103 mmol/L (ref 98–111)
Creatinine, Ser: 0.76 mg/dL (ref 0.44–1.00)
GFR, Estimated: >60 mL/min (ref >60)
Glucose, Bld: 142 mg/dL — ABNORMAL HIGH (ref 70–99)
Potassium: 3.6 mmol/L (ref 3.5–5.1)
Sodium: 137 mmol/L (ref 135–145)
Total Bilirubin: 0.7 mg/dL (ref 0.3–1.2)
Total Protein: 7.5 g/dL (ref 6.5–8.1)

## 2022-07-19 LAB — CBG MONITORING, ED: Glucose-Capillary: 149 mg/dL — ABNORMAL HIGH (ref 70–99)

## 2022-07-19 LAB — I-STAT BETA HCG BLOOD, ED (NOT ORDERABLE): I-stat hCG, quantitative: <5 m[IU]/mL (ref <5)

## 2022-07-19 LAB — ETHANOL: Alcohol, Ethyl (B): <10 mg/dL (ref <10)

## 2022-07-19 LAB — APTT: aPTT: 28 seconds (ref 24–36)

## 2022-07-19 MED ORDER — KETOROLAC TROMETHAMINE 15 MG/ML IJ SOLN
15.0000 mg | Freq: Four times a day (QID) | INTRAMUSCULAR | Status: DC
  Administered 2022-07-19: 15 mg via INTRAVENOUS
  Filled 2022-07-19: qty 1

## 2022-07-19 MED ORDER — DIPHENHYDRAMINE HCL 50 MG/ML IJ SOLN
25.0000 mg | Freq: Four times a day (QID) | INTRAMUSCULAR | Status: DC
  Administered 2022-07-19: 25 mg via INTRAVENOUS
  Filled 2022-07-19: qty 1

## 2022-07-19 MED ORDER — PROCHLORPERAZINE EDISYLATE 10 MG/2ML IJ SOLN
10.0000 mg | Freq: Four times a day (QID) | INTRAMUSCULAR | Status: DC
  Administered 2022-07-19: 10 mg via INTRAVENOUS
  Filled 2022-07-19: qty 2

## 2022-07-19 MED ORDER — LORAZEPAM 2 MG/ML IJ SOLN
INTRAMUSCULAR | Status: AC
  Filled 2022-07-19: qty 1

## 2022-07-19 NOTE — Consult Note (Signed)
Neurology Consultation Reason for Consult: Code stroke Requesting Physician: Marily Memos  CC: Slurred speech and right facial droop   History is obtained from: Patient, husband, daughter and chart review   HPI: Emily Hester is a 52 y.o. 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  Her husband did come up to bed at 2 AM, she was already in bed but he asked her if she wanted to use the bathroom per their usual routine.  While she was in the bathroom she suddenly yelled out and said that her headache was much worse and she needed to go to her doctor for evaluation.  Subsequently she has had slurred speech and right-sided facial droop.  She has never had the symptoms with headache before.  She has no known history of stroke.  She was hypertensive on EMS arrival but blood pressure is improving in the ED  On recent physical therapy examination she was noted to have 3/5 for right hip flexion (give way weakness) and 4/5 left hip flexion, currently undergoing aquatic physical therapy to make functional gains  LKW: 2:45 AM Thrombolytic given?: No, MRI negative for stroke IA performed?: No, exam not c/w LVO Premorbid modified rankin scale:     2 - Slight disability. Able to look after own affairs without assistance, but unable to carry out all previous activities.     3 - Moderate disability. Requires some help, but able to walk unassisted.  ROS: Limited review of systems focused on TNK exclusion criteria, negative.  She has also been having some left shoulder issues.  Past Medical History:  Diagnosis Date   Arthritis    patient denies   Asthma    Diabetes mellitus without complication (HCC)    type     GERD (gastroesophageal reflux disease)    Hypertension    Interstitial cystitis    IUD (intrauterine device) in place placed 6 weeks ago   Migraines    MIgraines- not current     Pneumonia    Past Surgical History:  Procedure Laterality Date   ANTERIOR HIP REVISION Right 05/20/2020   Procedure: RIGHT TOTAL HIP REVISION- ploy exhange and hip ball exchange with bone graft;  Surgeon: Kathryne Hitch, MD;  Location: [facility] OR;  Service: Orthopedics;  Laterality: Right;   DILATION AND EVACUATION  09/30/2011   Procedure: DILATATION AND EVACUATION;  Surgeon: Cristella Andrea, MD;  Location: [facility];  Service: Gynecology;  Laterality: N/A;   left knee surgery      arthroscopy    TOTAL HIP ARTHROPLASTY Right 03/11/2017   Procedure: RIGHT TOTAL HIP ARTHROPLASTY ANTERIOR APPROACH;  Surgeon: Kathryne Hitch, MD;  Location: [facility];  Service: Orthopedics;  Laterality: Right;    Current Facility-Administered Medications:    diphenhydrAMINE (BENADRYL) injection 25 mg, 25 mg, Intravenous, Q6H, Phylisha Dix L, MD   ketorolac (TORADOL) 15 MG/ML injection 15 mg, 15 mg, Intravenous, Q6H, Chamia Schmutz L, MD   prochlorperazine (COMPAZINE) injection 10 mg, 10 mg, Intravenous, Q6H, Analise Glotfelty L, MD  Current Outpatient Medications:    acetaminophen (TYLENOL) 500 MG tablet, Take 1,000 mg by mouth every 6 (six) hours as needed for moderate pain., Disp: , Rfl:    albuterol (PROVENTIL) (2.5 MG/3ML) 0.083% nebulizer solution, Take  3 mLs (2.5 mg total) by nebulization every 4 (four) hours as needed for wheezing or shortness of breath., Disp: 75 mL, Rfl: 1   albuterol (VENTOLIN HFA) 108 (90 Base) MCG/ACT inhaler, Inhale 2 puffs into the lungs every 4 (four) hours as needed., Disp: 18 g, Rfl: 1   amLODipine (NORVASC) 10 MG tablet, Take 10 mg by mouth every morning., Disp: , Rfl:    aspirin-acetaminophen-caffeine (EXCEDRIN MIGRAINE) 250-250-65 MG tablet, Take 2 tablets by mouth every 6 (six)  hours as needed for headache., Disp: , Rfl:    budesonide-formoterol (SYMBICORT) 160-4.5 MCG/ACT inhaler, Inhale 2 puffs into the lungs 2 (two) times daily., Disp: 1 each, Rfl: 5   cetirizine (ZYRTEC) 10 MG tablet, Take 1 tablet (10 mg total) by mouth daily., Disp: 90 tablet, Rfl: 1   Cholecalciferol (VITAMIN D) 50 MCG (2000 UT) tablet, Take 2,000 Units by mouth daily., Disp: , Rfl:    clindamycin (CLEOCIN) 150 MG capsule, Take 600 mg by mouth See admin instructions. Take 600 mg 1 hour prior to dental work, Disp: , Rfl:    diazepam (VALIUM) 5 MG tablet, Take 1 by mouth 1 hour  pre-procedure with very light food. May bring 2nd tablet to appointment., Disp: 2 tablet, Rfl: 0   diphenhydrAMINE (BENADRYL) 25 MG tablet, Take 25 mg by mouth daily as needed for allergies., Disp: , Rfl:    EPINEPHrine 0.3 mg/0.3 mL IJ SOAJ injection, Inject 0.3 mg into the muscle as directed., Disp: , Rfl:    gabapentin (NEURONTIN) 100 MG capsule, Take 1 capsule (100 mg total) by mouth at bedtime., Disp: 30 capsule, Rfl: 0   HYDROcodone-acetaminophen (NORCO) 7.5-325 MG tablet, Take 1 tablet by mouth 3 (three) times daily as needed for moderate pain., Disp: 30 tablet, Rfl: 0   ibuprofen (ADVIL) 200 MG tablet, Take 600 mg by mouth every 6 (six) hours as needed for headache or moderate pain., Disp: , Rfl:    ipratropium (ATROVENT) 0.06 % nasal spray, Apply 2 sprays each nostril up to 3-4 times a day as needed, Disp: 45 mL, Rfl: 1   Lancets (ONETOUCH DELICA PLUS LANCET33G) MISC, Apply 1 each topically daily., Disp: , Rfl:    levonorgestrel (MIRENA) 20 MCG/24HR IUD, 1 each by Intrauterine route once., Disp: , Rfl:    lidocaine (LIDODERM) 5 %, Place 1 patch onto the skin daily as needed (pain). Remove & Discard patch within 12 hours or as directed by MD, Disp: 30 patch, Rfl: 3   losartan (COZAAR) 25 MG tablet, Take 25 mg by mouth daily., Disp: , Rfl: 0   metFORMIN (GLUCOPHAGE-XR) 750 MG 24 hr tablet, Take 750 mg by mouth 2 (two)  times daily., Disp: , Rfl:    montelukast (SINGULAIR) 10 MG tablet, Take 1 tablet (10 mg total) by mouth at bedtime., Disp: 90 tablet, Rfl: 1   nystatin cream (MYCOSTATIN), APPLY EXTERNALLY TWICE A DAY AS NEEDED 30, Disp: , Rfl:    olopatadine (PATANOL) 0.1 % ophthalmic solution, , Disp: , Rfl:    omeprazole (PRILOSEC) 20 MG capsule, Take 1 capsule (20 mg total) by mouth every morning. (Patient taking differently: Take 20 mg by mouth daily.), Disp: 30 capsule, Rfl: 5   ondansetron (ZOFRAN) 8 MG tablet, Take 8 mg by mouth every 8 (eight) hours as needed for nausea or vomiting., Disp: , Rfl:    ONETOUCH VERIO test strip, as directed. , Disp: , Rfl: 3   rosuvastatin (CRESTOR) 10 MG tablet, Take 10 mg by mouth  daily., Disp: , Rfl: 3   Semaglutide, 2 MG/DOSE, (OZEMPIC, 2 MG/DOSE,) 8 MG/3ML SOPN, Inject 2 mg into the skin once a week., Disp: , Rfl:    Spacer/Aero-Holding Chambers DEVI, Take 1 each by mouth as directed., Disp: 1 each, Rfl: 0   tiZANidine (ZANAFLEX) 4 MG tablet, TAKE 1 TABLET BY MOUTH EVERY 6 HOURS AS NEEDED FOR MUSCLE SPASMS., Disp: 360 tablet, Rfl: 1   traMADol (ULTRAM) 50 MG tablet, Take 1 tablet by mouth every 6 (six) hours as needed., Disp: , Rfl:    Vibegron (GEMTESA PO), Take by mouth daily., Disp: , Rfl:    Family History  Problem Relation Age of Onset   Diabetes Mother    Heart disease Mother    Diabetes Father    Eczema Sister    Asthma Brother    Asthma Son    Eczema Daughter    Allergic rhinitis Neg Hx    Angioedema Neg Hx    Immunodeficiency Neg Hx    Urticaria Neg Hx     Social History:  reports that she has never smoked. She has never used smokeless tobacco. She reports that she does not drink alcohol and does not use drugs.   Exam: Current vital signs: BP (!) 102/59 (BP Location: Right Arm)   Pulse 92   Temp 98.3 F (36.8 C) (Oral)   Resp 17   Ht 5\' 5"  (1.651 m)   Wt 125.6 kg   SpO2 100%   BMI 46.08 kg/m  Vital signs in last 24 hours:      Physical Exam  Constitutional: Appears well-developed and well-nourished.  Psych: Affect mildly anxious but cooperative Eyes: No scleral injection HENT: No oropharyngeal obstruction.  MSK: no joint deformities.  Cardiovascular: Normal rate and regular rhythm. Perfusing extremities well Respiratory: Effort normal, non-labored breathing GI: Soft.  No distension. There is no tenderness.  Skin: Warm dry and intact visible skin  Neuro: Mental Status: Patient is awake, alert, oriented to person, place, month, year, and situation. Patient is able to give a clear and coherent history. No signs of aphasia or neglect Cranial Nerves: II: Visual Fields are full. Pupils are equal, round, and reactive to light.   III,IV, VI: EOMI without ptosis or diploplia.  V: Facial sensation is symmetric to temperature VII: Facial movement is notable for distractible right facial droop VIII: hearing is intact to voice X: Uvula elevates symmetrically XI: Shoulder shrug is symmetric. XII: tongue is midline without atrophy or fasciculations.  Motor: Tone is normal. Bulk is normal.  No drift of the bilateral upper extremities, but there is bilateral pronation.  She lifts the lower extremities only from the knee down antigravity, with poor apparent effort Sensory: Sensation is symmetric to light touch and temperature in the arms and legs. Cerebellar: FNF intact bilaterally Gait:  Deferred in acute setting  NIHSS total 8 Score breakdown: 1 point for right facial droop although this is distractible, 3 points for right leg weakness, 3 points for left leg weakness, 1 point for dysarthria   I have reviewed labs in epic and the results pertinent to this consultation are:  Basic Metabolic Panel: Recent Labs  Lab 07/19/22 0332 07/19/22 0333 07/19/22 0334  NA 137 139 139  K 3.6 3.7 3.7  CL 103 104  --   CO2 25  --   --   GLUCOSE 142* 141*  --   BUN 7 7  --   CREATININE 0.76 0.60  --   CALCIUM 9.2   --   --  CBC: Recent Labs  Lab 07/19/22 0332 07/19/22 0333 07/19/22 0334  WBC 9.5  --   --   NEUTROABS 5.3  --   --   HGB 13.9 15.0 14.6  HCT 41.6 44.0 43.0  MCV 83.0  --   --   PLT 287  --   --     Coagulation Studies: Recent Labs    07/19/22 0332  LABPROT 12.7  INR 1.0      I have reviewed the images obtained:  Head CT personally reviewed, agree with radiology: no acute intracranial process   MRI brain personally reviewed, agree with radiology: Normal for age noncontrast MRI appearance of the Brain.   Impression: Functional weakness in the setting of migraine/complicated migraine symptoms.  Emergent code stroke recommendations: - Given patient's significant comorbidities and no prior similar presentations, MRI brain was obtained to exclude stroke  Additional recommendations: -IV ketorolac, Benadryl, Compazine ordered for migraine management -Ambulatory referral to [facility] Doctor Chima or Dr. Lucia Gaskins for headache management placed -Inpatient neurology will be available as needed please reach out if any additional questions or concerns arise   Brooke Dare MD-PhD [facility] [phone] Available 7 PM to 7 AM, outside of these hours please call Neurologist on call as listed on Amion.   Total critical care time: 40 minutes   Critical care time was exclusive of separately billable procedures and treating other patients.   Critical care was necessary to treat or prevent imminent or life-threatening deterioration.   Critical care was time spent personally by me on the following activities: development of treatment plan with patient and/or surrogate as well as nursing, discussions with consultants/primary team, evaluation of patient's response to treatment, examination of patient, obtaining history from patient or surrogate, ordering and performing treatments and interventions, ordering and review of laboratory studies, ordering  and review of radiographic studies, and re-evaluation of patient's condition as needed, as documented above.

## 2022-07-19 NOTE — ED Triage Notes (Signed)
Pt BIBEMS w/ c/o slurred speech & facial droop.LKW 0245

## 2022-07-19 NOTE — ED Provider Notes (Signed)
[place] EMERGENCY DEPARTMENT AT [facility] Provider Note   CSN: [id number] Arrival date & time: 07/19/22  4098  An emergency department physician performed an initial assessment on this suspected stroke patient at 87.  History  Chief Complaint  Patient presents with   Code Stroke    Emily Hester is a 52 y.o. female.  52 year old female presents ER today secondary to acute left-sided headache and left facial droop.  History of migraines.  This started approximate 0 200.  Code stroke was initiated in the field and brought here emergently to CT scanner.  Seen by neurology managed by neurology initially.          Home Medications Prior to Admission medications   Medication Sig Start Date End Date Taking? Authorizing Provider  acetaminophen (TYLENOL) 500 MG tablet Take 1,000 mg by mouth every 6 (six) hours as needed for moderate pain.    [provider]  albuterol (PROVENTIL) (2.5 MG/3ML) 0.083% nebulizer solution Take 3 mLs (2.5 mg total) by nebulization every 4 (four) hours as needed for wheezing or shortness of breath. 08/29/20   Marcelyn Bruins, MD  albuterol (VENTOLIN HFA) 108 (90 Base) MCG/ACT inhaler Inhale 2 puffs into the lungs every 4 (four) hours as needed. 06/25/22   Marcelyn Bruins, MD  amLODipine (NORVASC) 10 MG tablet Take 10 mg by mouth every morning.    [provider]  aspirin-acetaminophen-caffeine (EXCEDRIN MIGRAINE) [phone] MG tablet Take 2 tablets by mouth every 6 (six) hours as needed for headache.    [provider]  budesonide-formoterol (SYMBICORT) 160-4.5 MCG/ACT inhaler Inhale 2 puffs into the lungs 2 (two) times daily. 06/25/22   Marcelyn Bruins, MD  cetirizine (ZYRTEC) 10 MG tablet Take 1 tablet (10 mg total) by mouth daily. 12/25/21   Marcelyn Bruins, MD  Cholecalciferol (VITAMIN D) 50 MCG (2000 UT) tablet Take 2,000 Units by mouth daily.    [provider]   clindamycin (CLEOCIN) 150 MG capsule Take 600 mg by mouth See admin instructions. Take 600 mg 1 hour prior to dental work 10/10/19   [provider]  diazepam (VALIUM) 5 MG tablet Take 1 by mouth 1 hour  pre-procedure with very light food. May bring 2nd tablet to appointment. 05/24/22   Tyrell Antonio, MD  diphenhydrAMINE (BENADRYL) 25 MG tablet Take 25 mg by mouth daily as needed for allergies.    [provider]  EPINEPHrine 0.3 mg/0.3 mL IJ SOAJ injection Inject 0.3 mg into the muscle as directed.    [provider]  gabapentin (NEURONTIN) 100 MG capsule Take 1 capsule (100 mg total) by mouth at bedtime. 10/16/20   Kirtland Bouchard, PA-C  HYDROcodone-acetaminophen (NORCO) 7.5-325 MG tablet Take 1 tablet by mouth 3 (three) times daily as needed for moderate pain. 07/05/22   Kathryne Hitch, MD  ibuprofen (ADVIL) 200 MG tablet Take 600 mg by mouth every 6 (six) hours as needed for headache or moderate pain.    [provider]  ipratropium (ATROVENT) 0.06 % nasal spray Apply 2 sprays each nostril up to 3-4 times a day as needed 06/25/22   Marcelyn Bruins, MD  Lancets [facility] DELICA PLUS [place]) MISC Apply 1 each topically daily. 04/19/22   [provider]  levonorgestrel (MIRENA) 20 MCG/24HR IUD 1 each by Intrauterine route once.    [provider]  lidocaine (LIDODERM) 5 % Place 1 patch onto the skin daily as needed (pain). Remove & Discard patch within  12 hours or as directed by MD 01/18/22   Kirtland Bouchard, PA-C  losartan (COZAAR) 25 MG tablet Take 25 mg by mouth daily. 10/20/16   [provider]  metFORMIN (GLUCOPHAGE-XR) 750 MG 24 hr tablet Take 750 mg by mouth 2 (two) times daily. 03/21/18   [provider]  montelukast (SINGULAIR) 10 MG tablet Take 1 tablet (10 mg total) by mouth at bedtime. 06/25/22   Marcelyn Bruins, MD  nystatin cream (MYCOSTATIN) APPLY EXTERNALLY TWICE A DAY AS NEEDED 30     [provider]  olopatadine (PATANOL) 0.1 % ophthalmic solution     [provider]  omeprazole (PRILOSEC) 20 MG capsule Take 1 capsule (20 mg total) by mouth every morning. Patient taking differently: Take 20 mg by mouth daily. 12/02/16   Marcelyn Bruins, MD  ondansetron (ZOFRAN) 8 MG tablet Take 8 mg by mouth every 8 (eight) hours as needed for nausea or vomiting.    [provider]  [facility] VERIO test strip as directed.  09/01/17   [provider]  rosuvastatin (CRESTOR) 10 MG tablet Take 10 mg by mouth daily. 10/20/16   [provider]  Semaglutide, 2 MG/DOSE, (OZEMPIC, 2 MG/DOSE,) 8 MG/3ML SOPN Inject 2 mg into the skin once a week. 05/05/21   [provider]  Spacer/Aero-Holding Deretha Emory DEVI Take 1 each by mouth as directed. 10/06/20   Nehemiah Settle, FNP  tiZANidine (ZANAFLEX) 4 MG tablet TAKE 1 TABLET BY MOUTH EVERY 6 HOURS AS NEEDED FOR MUSCLE SPASMS. 08/25/21   Kathryne Hitch, MD  traMADol (ULTRAM) 50 MG tablet Take 1 tablet by mouth every 6 (six) hours as needed.    [provider]  Vibegron (GEMTESA PO) Take by mouth daily.    [provider]      Allergies    Amoxicillin, Lisinopril, Olmesartan, and Bactrim    Review of Systems   Review of Systems  Physical Exam Updated Vital Signs BP (!) 102/59 (BP Location: Right Arm)   Pulse 92   Temp 98.3 F (36.8 C) (Oral)   Resp 17   Ht 5\' 5"  (1.651 m)   Wt 125.6 kg   SpO2 100%   BMI 46.08 kg/m  Physical Exam Vitals and nursing note reviewed.  Constitutional:      Appearance: She is well-developed.  HENT:     Head: Normocephalic and atraumatic.  Eyes:     Pupils: Pupils are equal, round, and reactive to light.  Cardiovascular:     Rate and Rhythm: Normal rate and regular rhythm.  Pulmonary:     Effort: No respiratory distress.     Breath sounds: No stridor.  Abdominal:     General: There is no distension.  Musculoskeletal:         General: No swelling. Normal range of motion.     Cervical back: Normal range of motion.  Skin:    General: Skin is warm and dry.  Neurological:     General: No focal deficit present.     Mental Status: She is alert. Mental status is at baseline.     ED Results / Procedures / Treatments   Labs (all labs ordered are listed, but only abnormal results are displayed) Labs Reviewed  CBC - Abnormal; Notable for the following components:      Result Value   RDW 16.9 (*)    All other components within normal limits  COMPREHENSIVE METABOLIC PANEL - Abnormal; Notable for the following components:  Glucose, Bld 142 (*)    All other components within normal limits  RAPID URINE DRUG SCREEN, HOSP PERFORMED - Abnormal; Notable for the following components:   Opiates POSITIVE (*)    All other components within normal limits  URINALYSIS, ROUTINE W REFLEX MICROSCOPIC - Abnormal; Notable for the following components:   Color, Urine STRAW (*)    All other components within normal limits  CBG MONITORING, ED - Abnormal; Notable for the following components:   Glucose-Capillary 149 (*)    All other components within normal limits  POCT I-STAT, CHEM 8 - Abnormal; Notable for the following components:   Glucose, Bld 141 (*)    All other components within normal limits  POCT I-STAT EG7 - Abnormal; Notable for the following components:   pCO2, Ven 42.5 (*)    pO2, Ven 27 (*)    All other components within normal limits  ETHANOL  PROTIME-INR  APTT  DIFFERENTIAL  I-STAT CHEM 8, ED  I-STAT BETA HCG BLOOD, ED ([facility], [facility], [facility] ONLY)  I-STAT BETA HCG BLOOD, ED (NOT ORDERABLE)    EKG None  Radiology MR BRAIN WO CONTRAST  Result Date: 07/19/2022 CLINICAL DATA:  52 year old female code stroke presentation. Slurred speech and facial droop. EXAM: MRI HEAD WITHOUT CONTRAST TECHNIQUE: Multiplanar, multiecho pulse sequences of the brain and surrounding structures were obtained without intravenous contrast.  COMPARISON:  Plain head CT 0337 hours today. FINDINGS: Brain: No restricted diffusion to suggest acute infarction. No midline shift, mass effect, evidence of mass lesion, ventriculomegaly, extra-axial collection or acute intracranial hemorrhage. Cervicomedullary junction and pituitary are within normal limits. Normal cerebral volume. Wallace Cullens and white matter signal is within normal limits for age throughout the brain. No chronic cerebral blood products or convincing encephalomalacia. Vascular: Major intracranial vascular flow voids are preserved. Skull and upper cervical spine: Normal visible cervical spine. Visualized bone marrow signal is within normal limits. Sinuses/Orbits: Negative. Other: Mastoids are clear. Visible internal auditory structures appear within normal limits. Normal stylomastoid foramina. Negative visible scalp and face IMPRESSION: Normal for age noncontrast MRI appearance of the Brain. Electronically Signed   By: Odessa Fleming M.D.   On: 07/19/2022 04:37   CT HEAD CODE STROKE WO CONTRAST  Result Date: 07/19/2022 CLINICAL DATA:  Code stroke. EXAM: CT HEAD WITHOUT CONTRAST TECHNIQUE: Contiguous axial images were obtained from the base of the skull through the vertex without intravenous contrast. RADIATION DOSE REDUCTION: This exam was performed according to the departmental dose-optimization program which includes automated exposure control, adjustment of the mA and/or kV according to patient size and/or use of iterative reconstruction technique. COMPARISON:  None Available. FINDINGS: Brain: Cerebral volume within normal limits for patient age. No evidence for acute intracranial hemorrhage. No findings to suggest acute large vessel territory infarct. No mass lesion, midline shift, or mass effect. Ventricles are normal in size without evidence for hydrocephalus. No extra-axial fluid collection identified. Vascular: No hyperdense vessel identified. Skull: Scalp soft tissues demonstrate no acute  abnormality. Calvarium intact. Sinuses/Orbits: Globes and orbital soft tissues within normal limits. Visualized paranasal sinuses are clear. No mastoid effusion. ASPECTS [facility] Stroke Program Early CT Score) - Ganglionic level infarction (caudate, lentiform nuclei, internal capsule, insula, M1-M3 cortex): 7 - Supraganglionic infarction (M4-M6 cortex): 3 Total score (0-10 with 10 being normal): 10 IMPRESSION: 1. Normal head CT.  No acute intracranial abnormality. 2. ASPECTS is 10. These results were communicated to Dr. Iver Nestle at 3:44 am on 07/19/2022 by text page via the [facility] messaging system. Electronically Signed  By: Rise Mu M.D.   On: 07/19/2022 03:45    Procedures Procedures    Medications Ordered in ED Medications  ketorolac (TORADOL) 15 MG/ML injection 15 mg (15 mg Intravenous Given 07/19/22 0501)  diphenhydrAMINE (BENADRYL) injection 25 mg (25 mg Intravenous Given 07/19/22 0502)  prochlorperazine (COMPAZINE) injection 10 mg (10 mg Intravenous Given 07/19/22 0501)    ED Course/ Medical Decision Making/ A&P                             Medical Decision Making Amount and/or Complexity of Data Reviewed Labs: ordered. Radiology: ordered.   On reevaluation patient headache is improving and neurologic changes seem to have dissipated compared to initial evaluation.  Still quite sleepy and will need to be a little bit more weight prior to discharge this is likely related to the headache medications.  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

## 2022-07-19 NOTE — ED Provider Notes (Signed)
Patient arrived overnight with stroke symptoms.  Stroke workup is unremarkable.  Suspicion for likely complex migraine.  She is feeling better on my evaluation.  Was able to ambulate to the bathroom without any assistance.  Was able to eat and drink without any issues.  She is made aware of findings and she will follow-up with neurology outpatient.  This chart was dictated using voice recognition software.  Despite best efforts to proofread,  errors can occur which can change the documentation meaning.    Emily Norfolk, DO 07/19/22 [phone]

## 2022-07-19 NOTE — ED Notes (Signed)
Patient tolerating oral intake without nausea or emesis. Ambulated to bathroom with steady gait

## 2022-07-19 NOTE — Code Documentation (Signed)
Stroke Response Nurse Documentation Code Documentation  Emily Hester is a 52 y.o. female arriving to [facility]  via [facility] on 5/6 with past medical hx of HTN, DM, obesity. On No antithrombotic. Code stroke was activated by EMS.   Patient from home where she was LKW at 0245 and now complaining of left facial droop and slurred speech.   Stroke team at the bedside on patient arrival. Labs drawn and patient cleared for CT by Dr. Erin Hearing. Patient to CT with team. NIHSS 8, see documentation for details and code stroke times. Patient with left facial droop, bilateral leg weakness, and dysarthria  on exam. The following imaging was completed:  CT Head and MRI. Patient is not a candidate for IV Thrombolytic due to MRI neg. Patient is not a candidate for IR due to low suspicion for LVO.     Bedside handoff with ED RN Gillis Ends.    Rose Fillers  Rapid Response RN

## 2022-07-26 ENCOUNTER — Telehealth: Payer: Self-pay

## 2022-07-26 NOTE — Telephone Encounter (Signed)
Transition Care Management Follow-up Telephone Call Date of discharge and from where: [facility] 5/6 How have you been since you were released from the hospital?Still hurting Any questions or concerns? No  Items Reviewed: Did the pt receive and understand the discharge instructions provided? Yes  Medications obtained and verified? No  Other? No  Any new allergies since your discharge? No  Dietary orders reviewed? No Do you have support at home? Yes     Follow up appointments reviewed:  PCP Hospital f/u appt confirmed? No  Scheduled to see  on  @ . Specialist Hospital f/u appt confirmed? Yes  Scheduled to see Neurology  on 5/14 @ . Are transportation arrangements needed? No  If their condition worsens, is the pt aware to call PCP or go to the Emergency Dept.? Yes Was the patient provided with contact information for the PCP's office or ED? Yes Was to pt encouraged to call back with questions or concerns? Yes

## 2022-07-27 ENCOUNTER — Encounter: Payer: Self-pay | Admitting: Neurology

## 2022-07-27 ENCOUNTER — Telehealth: Payer: Self-pay | Admitting: Neurology

## 2022-07-27 ENCOUNTER — Ambulatory Visit (INDEPENDENT_AMBULATORY_CARE_PROVIDER_SITE_OTHER): Payer: Managed Care, Other (non HMO) | Admitting: Neurology

## 2022-07-27 VITALS — BP 126/80 | HR 88 | Ht 65.0 in | Wt 269.0 lb

## 2022-07-27 DIAGNOSIS — Z9189 Other specified personal risk factors, not elsewhere classified: Secondary | ICD-10-CM

## 2022-07-27 DIAGNOSIS — R351 Nocturia: Secondary | ICD-10-CM

## 2022-07-27 DIAGNOSIS — G43001 Migraine without aura, not intractable, with status migrainosus: Secondary | ICD-10-CM | POA: Diagnosis not present

## 2022-07-27 DIAGNOSIS — R479 Unspecified speech disturbances: Secondary | ICD-10-CM

## 2022-07-27 MED ORDER — UBRELVY 50 MG PO TABS: 50.0000 mg | ORAL_TABLET | ORAL | 3 refills | Status: DC | PRN

## 2022-07-27 NOTE — Patient Instructions (Addendum)
I would like to suggest that you see your headache specialist again, he has records on what medications you have tried and failed for migraines before, you currently do not fit the criteria for one of the injectable migraine preventative medications or Botox injections.  For as needed use, I will prescribe Ubrelvy, 50 mg strength: Take 1 pill at onset of migraine headache, may repeat in 2 hours, no more than 2 pills in 24 hours. May cause sedation and nausea.   We will proceed with a sleep study to see if you have underlying obstructive sleep apnea, you may be at risk for it.  If you have obstructive sleep apnea I would likely recommend a machine called AutoPap for home use.  Please try to hydrate better with water, 6 to 8 cups of water per day I recommended, generally speaking, 8 ounce size each.  Please limit caffeine and avoid alcohol.  Please do not use Fioricet daily as it can be addicting and perpetuate headaches as well. Please do not take Excedrin Migraine daily either.  If you have sudden onset of worsening headache or new symptoms or worsening slurring of speech, please proceed to the emergency room immediately or call 911.  You may need to be admitted for a more thorough workup at the time as initial workup in the emergency room last week did not reveal any obvious cause for your symptoms.   Your presentation is not classic for complex migraine.

## 2022-07-27 NOTE — Progress Notes (Signed)
Subjective:    Patient ID: Emily Hester is a 52 y.o. female.  HPI    Huston Foley, MD, PhD [facility] [address] [address] [place], [place] [address]  I saw patient, Emily Hester, as a referral from the emergency room for complex migraine.  The patient is accompanied by her mother today.  Emily Hester is a 52 year old female with an underlying medical history of migraine headaches, hyperlipidemia, arthritis, asthma, diabetes, reflux disease, hypertension, interstitial cystitis, history of pneumonia, and severe obesity with a BMI of over 40, who reports a longstanding history of migraine headaches for several years.  She reports that this past week she had a migraine that has been constant.  She has never had this before, she feels not significantly improved since her ER visit recently.  She reports that she had slurring of speech but her speech is better, she still has slowness in her speech and stuttering.  She has not fallen.  She has some light sensitivity when she has a migraine, typical migraine frequency can be once every several weeks.  She used to see Dr. Santiago Glad and has not seen him in over 4 to 5 years as she recalls.  She had trigger point injections and had tried several medications with him but does not recall all the medications tried, she recalls trying Maxalt in the past and Imitrex which did not help.  She also tried Topamax and is not sure what other medications she has tried before, never had Botox injections and does not recall trying any monthly injectables.    She had a sleep study several years ago, it was negative for sleep apnea at the time but reports weight gain since then, unclear how much weight gain, at least 10 pounds.  She does not always hydrate well, estimates that she drinks about 2 bottles of water per day, 16.9 ounce size.  She does not drink caffeine daily, decaf coffee only.  She is a non-smoker and  does not currently drink any alcohol.  She has a prescription for hydrocodone takes it as needed, last use was last week.  She no longer takes tramadol, she no longer takes gabapentin.  She went to her primary care last week and got a prescription for Fioricet and has been taking it for the past 3 days, about 3 times a day.  She has taken Excedrin Migraine but none since she started the Fioricet.  She has been taking Zanaflex daily. She snores, she is sleepy during the day, she has nocturia about twice per average night.  Bedtime is between 10 and 11 and rise time between 6:30 AM and 7.  She has no family history of sleep apnea.  Her Epworth sleepiness score is 17 out of 24, fatigue severity score is 63 out of 63.  She currently does not drive.  She lives with her husband and her 19 year old daughter.  She does not work, she is on disability since her hip replacement in 2018.  She has a eye exam twice a year, last exam was in March but was not a dilated exam, she is due for a full dilated exam in August of this year, she has prescription eyeglasses and the prescription did not change at the last appointment.  She denies any visual disturbance.  She presented to the emergency room on 07/19/2022 with one-sided headache and facial droop.  I reviewed the emergency room records.  Code stroke was initiated.  She  had a head CT without contrast on 07/19/2022 and I reviewed the results: Impression:1. Normal head CT.  No acute intracranial abnormality. 2. ASPECTS is 10. She had a brain MRI without contrast on 07/19/2022 and I reviewed the results: IMPRESSION: Normal for age noncontrast MRI appearance of the Brain. Laboratory testing included CBC, ethanol level, PT, INR, UDS, urinalysis, beta-hCG, CMP.  I reviewed laboratory test results, UDS was positive for opiates.  Urinalysis clear, chemistry panel showed blood sugar of 141, hemoglobin 15, hematocrit 44, potassium 3.7, sodium 139, BUN 7, creatinine 0.6, alcohol less  than 10, CBC benign, beta-hCG negative.  She was treated symptomatically with ketorolac, prochlorperazine, diphenhydramine.  She improved and was discharged from the emergency room.  Of note, she is on multiple medications including allergy medications and inhalers, she has a prescription for Valium, she takes Benadryl, gabapentin, hydrocodone as needed per orthopedics.  She is on a Lidoderm patch, she has a prescription for Zofran, she is on Ozempic.  Full list of medication as below.  She has been taking Excedrin migraine for her headaches. She also has a prescription for Fioricet.  Her Past Medical History Is Significant For: Past Medical History:  Diagnosis Date   Arthritis    patient denies   Asthma    Diabetes mellitus without complication (HCC)    type    GERD (gastroesophageal reflux disease)    Hypertension    Interstitial cystitis    IUD (intrauterine device) in place placed 6 weeks ago   Migraines    MIgraines- not current     Pneumonia     Her Past Surgical History Is Significant For: Past Surgical History:  Procedure Laterality Date   ANTERIOR HIP REVISION Right 05/20/2020   Procedure: RIGHT TOTAL HIP REVISION- ploy exhange and hip ball exchange with bone graft;  Surgeon: Kathryne Hitch, MD;  Location: [facility] OR;  Service: Orthopedics;  Laterality: Right;   DILATION AND EVACUATION  09/30/2011   Procedure: DILATATION AND EVACUATION;  Surgeon: Mira Andrea, MD;  Location: [facility] ORS;  Service: Gynecology;  Laterality: N/A;   left knee surgery      arthroscopy    TOTAL HIP ARTHROPLASTY Right 03/11/2017   Procedure: RIGHT TOTAL HIP ARTHROPLASTY ANTERIOR APPROACH;  Surgeon: Kathryne Hitch, MD;  Location: [facility] ORS;  Service: Orthopedics;  Laterality: Right;    Her Family History Is Significant For: Family History  Problem Relation Age of Onset   Diabetes Mother    Heart disease Mother    Diabetes Father    Eczema Sister    Asthma Brother    Eczema Daughter     Migraines Daughter    Migraines Daughter    Asthma Son    Allergic rhinitis Neg Hx    Angioedema Neg Hx    Immunodeficiency Neg Hx    Urticaria Neg Hx    Headache Neg Hx    Sleep apnea Neg Hx     Her Social History Is Significant For: Social History   Socioeconomic History   Marital status: Married    Spouse name: Not on file   Number of children: Not on file   Years of education: Not on file   Highest education level: Not on file  Occupational History   Not on file  Tobacco Use   Smoking status: Never   Smokeless tobacco: Never  Vaping Use   Vaping Use: Never used  Substance and Sexual Activity   Alcohol use: No   Drug use:  No   Sexual activity: Not on file  Other Topics Concern   Not on file  Social History Narrative   Not on file   Social Determinants of Health   Financial Resource Strain: Not on file  Food Insecurity: Not on file  Transportation Needs: Not on file  Physical Activity: Not on file  Stress: Not on file  Social Connections: Not on file    Her Allergies Are:  Allergies  Allergen Reactions   Amoxicillin Hives, Swelling and Other (See Comments)    Has patient had a PCN reaction causing immediate rash, facial/tongue/throat swelling, SOB or lightheadedness with hypotension: Yes Has patient had a PCN reaction causing severe rash involving mucus membranes or skin necrosis: No Has patient had a PCN reaction that required hospitalization: Yes Has patient had a PCN reaction occurring within the last 10 years: No  If all of the above answers are "NO", then may proceed with Cephalosporin use.    Lisinopril Itching and Cough    Heart racing    Olmesartan     jittery   Bactrim Rash  :   Her Current Medications Are:  Outpatient Encounter Medications as of 07/27/2022  Medication Sig   acetaminophen (TYLENOL) 500 MG tablet Take 1,000 mg by mouth every 6 (six) hours as needed for moderate pain.   albuterol (PROVENTIL) (2.5 MG/3ML) 0.083% nebulizer  solution Take 3 mLs (2.5 mg total) by nebulization every 4 (four) hours as needed for wheezing or shortness of breath.   albuterol (VENTOLIN HFA) 108 (90 Base) MCG/ACT inhaler Inhale 2 puffs into the lungs every 4 (four) hours as needed.   amLODipine (NORVASC) 10 MG tablet Take 10 mg by mouth every morning.   aspirin-acetaminophen-caffeine (EXCEDRIN MIGRAINE) 250-250-65 MG tablet Take 2 tablets by mouth every 6 (six) hours as needed for headache.   budesonide-formoterol (SYMBICORT) 160-4.5 MCG/ACT inhaler Inhale 2 puffs into the lungs 2 (two) times daily.   butalbital-acetaminophen-caffeine (FIORICET) 50-325-40 MG tablet 1 tablet as needed Orally every 4 hrs for 4 days   cetirizine (ZYRTEC) 10 MG tablet Take 1 tablet (10 mg total) by mouth daily.   Cholecalciferol (VITAMIN D) 50 MCG (2000 UT) tablet Take 2,000 Units by mouth daily.   clindamycin (CLEOCIN) 150 MG capsule Take 600 mg by mouth See admin instructions. Take 600 mg 1 hour prior to dental work   diphenhydrAMINE (BENADRYL) 25 MG tablet Take 25 mg by mouth daily as needed for allergies.   EPINEPHrine 0.3 mg/0.3 mL IJ SOAJ injection Inject 0.3 mg into the muscle as directed.   HYDROcodone-acetaminophen (NORCO) 7.5-325 MG tablet Take 1 tablet by mouth 3 (three) times daily as needed for moderate pain.   ibuprofen (ADVIL) 200 MG tablet Take 600 mg by mouth every 6 (six) hours as needed for headache or moderate pain.   ipratropium (ATROVENT) 0.06 % nasal spray Apply 2 sprays each nostril up to 3-4 times a day as needed   Lancets (ONETOUCH DELICA PLUS LANCET33G) MISC Apply 1 each topically daily.   levonorgestrel (MIRENA) 20 MCG/24HR IUD 1 each by Intrauterine route once.   lidocaine (LIDODERM) 5 % Place 1 patch onto the skin daily as needed (pain). Remove & Discard patch within 12 hours or as directed by MD   losartan (COZAAR) 25 MG tablet Take 25 mg by mouth daily.   metFORMIN (GLUCOPHAGE-XR) 750 MG 24 hr tablet Take 750 mg by mouth 2 (two)  times daily.   montelukast (SINGULAIR) 10 MG tablet Take 1 tablet (  10 mg total) by mouth at bedtime.   nystatin cream (MYCOSTATIN) APPLY EXTERNALLY TWICE A DAY AS NEEDED 30   olopatadine (PATANOL) 0.1 % ophthalmic solution    omeprazole (PRILOSEC) 20 MG capsule Take 1 capsule (20 mg total) by mouth every morning. (Patient taking differently: Take 20 mg by mouth daily.)   ondansetron (ZOFRAN) 8 MG tablet Take 8 mg by mouth every 8 (eight) hours as needed for nausea or vomiting.   ONETOUCH VERIO test strip as directed.    rosuvastatin (CRESTOR) 10 MG tablet Take 10 mg by mouth daily.   Semaglutide, 2 MG/DOSE, (OZEMPIC, 2 MG/DOSE,) 8 MG/3ML SOPN Inject 2 mg into the skin once a week.   Spacer/Aero-Holding Chambers DEVI Take 1 each by mouth as directed.   tiZANidine (ZANAFLEX) 4 MG tablet TAKE 1 TABLET BY MOUTH EVERY 6 HOURS AS NEEDED FOR MUSCLE SPASMS.   Vibegron (GEMTESA PO) Take by mouth daily.   diazepam (VALIUM) 5 MG tablet Take 1 by mouth 1 hour  pre-procedure with very light food. May bring 2nd tablet to appointment.   gabapentin (NEURONTIN) 100 MG capsule Take 1 capsule (100 mg total) by mouth at bedtime.   traMADol (ULTRAM) 50 MG tablet Take 1 tablet by mouth every 6 (six) hours as needed.   No facility-administered encounter medications on file as of 07/27/2022.  :   Review of Systems:  Out of a complete 14 point review of systems, all are reviewed and negative with the exception of these symptoms as listed below:  Review of Systems  Neurological:        Pt here for Migraines  Pt states in last week daily headaches Pt states headache pain starts in left temporal and travels to forehead and back of head. Pt states neck pain Pt states headache pain effects her speech   ESS FSS     Objective:  Neurological Exam  Physical Exam Physical Examination:   Vitals:   07/27/22 1037  BP: 126/80  Pulse: 88    General Examination: The patient is a very pleasant 52 y.o. female in no  acute distress. She appears well-developed and well-nourished and well groomed.   HEENT: Normocephalic, atraumatic, pupils are equal, round and reactive to light, extraocular tracking is good without limitation to gaze excursion or nystagmus noted.  No photophobia, funduscopic exam benign.  Hearing is grossly intact. Face is symmetric with normal facial animation. Speech is slow and deliberate sounding, no actual dysarthria, intermittent stuttering. There is no hypophonia. There is no lip, neck/head, jaw or voice tremor. Neck is supple with full range of passive and active motion. There are no carotid bruits on auscultation. Oropharynx exam reveals: moderate mouth dryness, adequate dental hygiene and moderate airway crowding, due to tonsillar size of about 2+, small airway, prominent uvula, Mallampati class II.  Neck circumference 16 3/8 inches.  Tongue protrudes centrally and palate elevates symmetrically.  Chest: Clear to auscultation without wheezing, rhonchi or crackles noted.  Heart: S1+S2+0, regular and normal without murmurs, rubs or gallops noted.   Abdomen: Soft, non-tender and non-distended.  Extremities: There is no pitting edema in the distal lower extremities bilaterally.   Skin: Warm and dry without trophic changes noted.   Musculoskeletal: exam reveals no obvious joint deformities, decreased range of motion right hip.Marland Kitchen   Neurologically:  Mental status: The patient is awake, alert and oriented in all 4 spheres. Her immediate and remote memory, attention, language skills and fund of knowledge are appropriate. There is no evidence  of aphasia, agnosia, apraxia or anomia. Speech is as described above.  Thought process is linear. Mood is normal and affect is normal.  Cranial nerves II - XII are as described above under HEENT exam.  Motor exam: Normal bulk, strength and tone is noted. There is no obvious action or resting tremor.  No drift or rebound, Romberg negative.  Fine motor skills  and coordination: Intact finger taps, hand movements and rapid alternating patting.  Intact foot taps bilaterally.     Cerebellar testing: No dysmetria or intention tremor. There is no truncal or gait ataxia.  Intact finger-to-nose, difficulty with heel-to-shin on the right side due to decreased range of motion in the hip but otherwise no dysmetria.  Normal heel-to-shin on the left.  Reflexes 1+ throughout, toes are downgoing bilaterally. Sensory exam: intact to light touch in the upper and lower extremities.  Gait, station and balance: She stands without difficulty, she is able to perform tandem walk, no walking aids.  Very slight limp on the right.  Assessment and Plan:  In summary, Emily Hester is a very pleasant 52 y.o.-year old female with an underlying medical history of migraine headaches, hyperlipidemia, arthritis, asthma, diabetes, reflux disease, hypertension, interstitial cystitis, history of pneumonia, and severe obesity with a BMI of over 40, who presents for evaluation of her migraine headache.  She has had an extended migraine for several days, presented to the emergency room last week and was treated symptomatically, had workup in the form of laboratory testing as well as imaging studies.  No stroke was confirmed, she still has lingering speech issues and still has a headache, her typical migraine frequency is infrequent, less than once a month typically.  She is advised that prolonged speech impairment is unusual even for complex migraines, patients with complex migraines have often some transient neurological accompaniments such as slurring of speech or weakness or numbness but these are typically not lingering symptoms, often only several minutes in length or a few hours, certainly not persistent.  If she has any worsening of her symptoms or new symptoms, she is strongly advised to go back to the emergency room.  She is advised not to drive, particularly because she is endorsing  significant sleepiness.  Sleepiness may be secondary to possible underlying obstructive sleep disordered breathing and/or medication side effects.  She had [facility] and is advised that since we have no additional information about her previous medications tried and failed, and she had a good report with her previous headache specialist, it would be best if she sees him again.  She is agreeable to this and I would like to make a referral to Dr. Neale Burly at this time.  In the meantime, she is cautioned against using Fioricet daily.  She is advised to not take it every day as it can perpetuate headaches and also sedate.  Some of her speech could be slowness and sluggish speech is secondary to medication effect as she also takes tizanidine and has taken as needed hydrocodone.  Neurological exam is otherwise nonfocal and she is largely reassured.  For now, for as needed use I suggested she try Ubrelvy 50 mg strength once daily as needed, not for daily use, not a preventative.  She can repeated after 2 hours if need be.  She is advised to stay better hydrated as suboptimal hydration can perpetuate headaches.  She may be at risk for sleep apnea and I would like  to proceed with a sleep study to look into this.  If she has obstructive sleep apnea, she is advised to try AutoPap therapy, she would be willing to do so.  She is advised that currently she does not qualify for one of the injectable migraine preventatives as her migraine frequency is typically not more than 15 headache days per month.  She in fact has infrequent migraines, she had previous trigger point injections but never had Botox injections, she does not quite qualify for Botox injections either.  I would recommend that she follow-up with Dr. Neale Burly, I placed a referral today.  I would like to proceed with a sleep study.  She is advised to proceed to the emergency room should she have sudden  worsening of her headache or new neurological symptoms or sudden worsening of her speech.  I answered all the questions today and the patient and her mom were in agreement with our plan.  We will get in touch with her once we have authorization for sleep testing.  We will call her with her sleep study results as well.  This was an extended visit over over 60 minutes with extended chart review, counseling and coordination of care and addressing multiple problems. Huston Foley, MD, PhD

## 2022-07-27 NOTE — Telephone Encounter (Signed)
[facility]ax[facility]: Phone: [phone]  Fax: [phone]

## 2022-07-28 ENCOUNTER — Other Ambulatory Visit (HOSPITAL_COMMUNITY): Payer: Self-pay

## 2022-07-28 ENCOUNTER — Telehealth: Payer: Self-pay

## 2022-07-28 NOTE — Telephone Encounter (Signed)
Pharmacy Patient Advocate Encounter   Received notification from [facility] that prior authorization for Ubrelvy 50MG  tablets is required/requested.   PA submitted on 07/28/2022 to (ins) [employer] via CoverMyMeds Key or (Medicaid) confirmation # [id number] Status is pending

## 2022-08-02 ENCOUNTER — Other Ambulatory Visit (HOSPITAL_COMMUNITY): Payer: Self-pay

## 2022-08-02 NOTE — Telephone Encounter (Signed)
Pharmacy Patient Advocate Encounter  Prior Authorization for Ubrelvy 50MG  tablets has been approved by [employer] (ins).    PA # PA Case ID #: [id number] Effective dates: 07/28/2022 through 08/01/2022  Copay is $0 per test claim.

## 2022-08-16 ENCOUNTER — Telehealth: Payer: Self-pay | Admitting: Neurology

## 2022-08-16 NOTE — Telephone Encounter (Signed)
[employer] pending faxed notes/ medicare no auth req

## 2022-08-26 NOTE — Telephone Encounter (Signed)
NPSG [employer] auth: [id number] (exp. 08/17/22 to 11/17/22)

## 2022-09-14 ENCOUNTER — Telehealth: Payer: Self-pay | Admitting: Allergy

## 2022-09-14 NOTE — Telephone Encounter (Signed)
Patient called stating due to the heat she is having to use her nebulizer every day asking for nurse to call to advise on anything else she should be doing please advise

## 2022-09-14 NOTE — Telephone Encounter (Signed)
Per provider's 06/25/22 office visit:  Asthma - continue Symbicort 2 puffs twice a day with spacer to help prevent cough and wheeze.   - continue Singulair (montelukast) 10mg  nightly - have access to albuterol inhaler 2 puffs every 4-6 hours as needed for cough/wheeze/shortness of breath/chest tightness.  May use 15-20 minutes prior to activity.   Monitor frequency of use.  - recommend use of your albuterol prior to outdoor activity, grilling etc - continue to do your best to avoid fragrance products  Patient stated the following:  She ran out of Symbicort about 3 weeks ago(June) - just started back using it about a week ago( end of June).  She has been using her nebulizer and Albuterol inhaler daily for the cough. She has been taking Cetirizine and Montelukast in the morning.  Reviewed provider's directions/instructions with patient.  Patient advised if she is still having the cough after she has been on all/using all prescribed medications consisently as directed - send a message to the provider via myChart or call the office to schedule a follow up office visit.  Patient advised message would also be forwarded to provider in case she wants to change anything.  Patient verbalized understanding to all, no further questions.

## 2022-09-15 NOTE — Telephone Encounter (Signed)
Called and informed Milagros of the [employer] system. Patient understood and said she will try that technique.  Drita [phone]

## 2022-10-01 ENCOUNTER — Ambulatory Visit: Payer: Medicare Other | Admitting: Diagnostic Neuroimaging

## 2022-10-13 ENCOUNTER — Ambulatory Visit (INDEPENDENT_AMBULATORY_CARE_PROVIDER_SITE_OTHER): Payer: Managed Care, Other (non HMO) | Admitting: Internal Medicine

## 2022-10-13 VITALS — BP 134/80 | HR 83 | Temp 98.3°F | Resp 20 | Ht 65.0 in | Wt 263.4 lb

## 2022-10-13 DIAGNOSIS — J3089 Other allergic rhinitis: Secondary | ICD-10-CM | POA: Diagnosis not present

## 2022-10-13 DIAGNOSIS — J4540 Moderate persistent asthma, uncomplicated: Secondary | ICD-10-CM

## 2022-10-13 DIAGNOSIS — E669 Obesity, unspecified: Secondary | ICD-10-CM | POA: Insufficient documentation

## 2022-10-13 DIAGNOSIS — N3010 Interstitial cystitis (chronic) without hematuria: Secondary | ICD-10-CM | POA: Insufficient documentation

## 2022-10-13 DIAGNOSIS — B009 Herpesviral infection, unspecified: Secondary | ICD-10-CM | POA: Insufficient documentation

## 2022-10-13 MED ORDER — ALBUTEROL SULFATE (2.5 MG/3ML) 0.083% IN NEBU: 2.5000 mg | INHALATION_SOLUTION | RESPIRATORY_TRACT | 1 refills | Status: DC | PRN

## 2022-10-13 MED ORDER — ALBUTEROL SULFATE HFA 108 (90 BASE) MCG/ACT IN AERS: 2.0000 | INHALATION_SPRAY | RESPIRATORY_TRACT | 1 refills | Status: DC | PRN

## 2022-10-13 MED ORDER — IPRATROPIUM BROMIDE 0.06 % NA SOLN: NASAL | 1 refills | Status: DC

## 2022-10-13 MED ORDER — BUDESONIDE 0.5 MG/2ML IN SUSP: RESPIRATORY_TRACT | 1 refills | Status: DC

## 2022-10-13 NOTE — Patient Instructions (Addendum)
Asthma - start Pulmicort nebulizer 0.5mg  twice daily for 1-2 weeks.   - continue Symbicort 2 puffs twice a day with spacer to help prevent cough and wheeze.   - continue Singulair (montelukast) 10mg  nightly - have access to albuterol inhaler 2 puffs every 4-6 hours as needed for cough/wheeze/shortness of breath/chest tightness.  May use 15-20 minutes prior to activity.   Monitor frequency of use.  - recommend use of your albuterol prior to outdoor activity, grilling etc - continue to do your best to avoid fragrance products Asthma control goals:  Full participation in all desired activities (may need albuterol before activity) Albuterol use two time or less a week on average (not counting use with activity) Cough interfering with sleep two time or less a month Oral steroids no more than once a year No hospitalizations  Allergic Rhinitis with conjunctivitis - continue allergen avoidance measures - continue Zyrtec (Cetirizine) 10 mg daily  - continue Singulair (montelukast) nightly - for nasal drainage/post-nasal drip use nasal Atrovent 0.06% 2 sprays each nostril up to 3-4 times a day as needed.   Recommend using in evening/prior to bedtime.  - use over-the-counter Pataday Xtra Strength, Pataday 1 drop each eye daily or Zaditor or Alaway 1 drop each eye twice a day as needed for itchy/watery/red eyes.   These are all over-the-counter allergy eyedrop you can use.   Food allergy - continue to avoid peanuts and tree nuts - in case of an allergic reaction, give Benadryl 4 teaspoonfuls every 4 hours, and if life-threatening symptoms occur, inject with EpiPen 0.3 mg  Keep follow up with Dr. Delorse Lek in October.

## 2022-10-13 NOTE — Progress Notes (Signed)
FOLLOW UP Date of Service/Encounter:  10/13/22   Subjective:  Emily Hester (DOB: [birth year]) is a 52 y.o. female who [facility] for follow up for cough/wheezing with history of asthma, food allergies and allergic rhinoconjunctvitis.    History obtained from: chart review and patient. Last seen 06/25/2022 by Dr. Delorse Lek for asthma and allergic rhinoconjunctivitis.  On Symbicort, Singulair, Zyrtec and Atrovent.  Also has food allergies and avoiding peanuts/treenuts.   Started about a month ago with coughing, rumbling in chest and now with some wheezing.  Cough is mostly dry.  Denies any recent illness.  Not much trouble with congestion, runny nose, drainage, sneezing.  Taking Symbicort 2 puffs BID and Singulair.  Also used albuterol nebulizer which helped somewhat. Also taking her Atrovent nose spray.  No sick contacts.   Past Medical History: Past Medical History:  Diagnosis Date   Arthritis    patient denies   Asthma    Diabetes mellitus without complication (HCC)    type    GERD (gastroesophageal reflux disease)    Hypertension    Interstitial cystitis    IUD (intrauterine device) in place placed 6 weeks ago   Migraines    MIgraines- not current     Pneumonia     Objective:  BP 134/80 (BP Location: Right Arm, Patient Position: Sitting, Cuff Size: Normal)   Pulse 83   Temp 98.3 F (36.8 C) (Temporal)   Resp 20   Ht 5\' 5"  (1.651 m)   Wt 263 lb 6.4 oz (119.5 kg)   SpO2 98%   BMI 43.83 kg/m  Body mass index is 43.83 kg/m. Physical Exam: GEN: alert, well developed HEENT: clear conjunctiva, nose with mild inferior turbinate hypertrophy, pink nasal mucosa, no rhinorrhea, no cobblestoning HEART: regular rate and rhythm, no murmur LUNGS: clear to auscultation bilaterally, no coughing, unlabored respiration SKIN: no rashes or lesions  Spirometry:  Tracings reviewed. Her effort: Good reproducible efforts. FVC: 2.52L FEV1:  1.96L, 81% predicted FEV1/FVC ratio: 78% Interpretation: Spirometry consistent with normal pattern.  Please see scanned spirometry results for details.   Assessment:   1. Moderate persistent asthma without complication   2. Non-seasonal allergic rhinitis due to other allergic trigger     Plan/Recommendations:  Moderate Persistent Asthma - Spirometry today was normal but with persistent cough and wheezing, no upper airway symptoms.  Will try nebulized steroids.   - start Pulmicort nebulizer 0.5mg  twice daily for 1-2 weeks.   - continue Symbicort 2 puffs twice a day with spacer to help prevent cough and wheeze.   - continue Singulair (montelukast) 10mg  nightly - have access to albuterol inhaler 2 puffs every 4-6 hours as needed for cough/wheeze/shortness of breath/chest tightness.  May use 15-20 minutes prior to activity.   Monitor frequency of use.  - recommend use of your albuterol prior to outdoor activity, grilling etc - continue to do your best to avoid fragrance products Asthma control goals:  Full participation in all desired activities (may need albuterol before activity) Albuterol use two time or less a week on average (not counting use with activity) Cough interfering with sleep two time or less a month Oral steroids no more than once a year No hospitalizations  Allergic Rhinitis with conjunctivitis - continue allergen avoidance measures - continue Zyrtec (Cetirizine) 10 mg daily  - continue Singulair (montelukast) nightly - for nasal drainage/post-nasal drip use nasal Atrovent 0.06% 2 sprays each nostril up to 3-4 times a  day as needed.   Recommend using in evening/prior to bedtime.  - use over-the-counter Pataday Xtra Strength, Pataday 1 drop each eye daily or Zaditor or Alaway 1 drop each eye twice a day as needed for itchy/watery/red eyes.   These are all over-the-counter allergy eyedrop you can use.   Food allergy - continue to avoid peanuts and tree nuts - in  case of an allergic reaction, give Benadryl 4 teaspoonfuls every 4 hours, and if life-threatening symptoms occur, inject with EpiPen 0.3 mg  Keep follow up with Dr. Delorse Lek in October.   Alesia Morin, MD [facility]

## 2022-10-18 ENCOUNTER — Ambulatory Visit: Payer: Medicare Other | Admitting: Psychiatry

## 2022-11-01 ENCOUNTER — Encounter: Payer: Self-pay | Admitting: Physician Assistant

## 2022-11-01 ENCOUNTER — Ambulatory Visit (INDEPENDENT_AMBULATORY_CARE_PROVIDER_SITE_OTHER): Payer: Managed Care, Other (non HMO) | Admitting: Physician Assistant

## 2022-11-01 DIAGNOSIS — M7062 Trochanteric bursitis, left hip: Secondary | ICD-10-CM | POA: Diagnosis not present

## 2022-11-01 DIAGNOSIS — M7542 Impingement syndrome of left shoulder: Secondary | ICD-10-CM

## 2022-11-01 MED ORDER — HYDROCODONE-ACETAMINOPHEN 7.5-325 MG PO TABS: 1.0000 | ORAL_TABLET | Freq: Four times a day (QID) | ORAL | 0 refills | Status: DC | PRN

## 2022-11-01 NOTE — Progress Notes (Signed)
Office Visit Note   [name]           Date of Birth: Jun 18, 1970           MRN: [id number] Visit Date: 11/01/2022              Requested by: Kirtland Bouchard, PA-C [address] [place],  [place] [address] PCP: Merri Brunette, MD   Assessment & Plan: Visit Diagnoses:  1. Impingement syndrome of left shoulder   2. Trochanteric bursitis, left hip     Plan: Will have her work on IT band stretching.  She is also reminded to the can doing the shoulder exercises that she was shown in therapy.  She will follow-up with [name] if pain persist or becomes worse.  She did ask for refill on her hydrocodone today to do a one-time fill she needs to use this sparingly.  Follow-Up Instructions: No follow-ups on file.   Orders:  No orders of the defined types were placed in this encounter.  No orders of the defined types were placed in this encounter.     Procedures: No procedures performed   Clinical Data: No additional findings.   Subjective: Chief Complaint  Patient presents with   Left Shoulder - Pain   Left Hip - Pain    HPI Mrs. Regan comes in today with left shoulder pain and left hip pain.  She is requesting a left shoulder injection and a left hip trochanteric injection.  She was last seen on 04/06/2022.  She at that time was having bilateral trochanteric pain and shoulder pain.  She underwent bilateral hip injections.  At length therapy for her shoulder.  She states that the injections helped.  She states that she is only having pain in the left hip for the last 2 weeks no known injury.  She feels that therapy helped with her shoulder pain but her shoulder pain just recently came back.  She reports her diabetes to be under good control with a hemoglobin A1c of 6.4. Review of Systems Denies any fevers or chills.  Objective: Vital Signs: There were no vitals taken for this visit.  Physical Exam Constitutional:      Appearance: She is not ill-appearing  or diaphoretic.  Pulmonary:     Effort: Pulmonary effort is normal.  Neurological:     Mental Status: She is alert and oriented to person, place, and time.  Psychiatric:        Mood and Affect: Mood normal.     Ortho Exam Bilateral hips: Good range of motion both hips.  Tenderness over the left hip trochanteric region only. Bilateral shoulders: 5 out of 5 strength external and internal rotation against resistance.  Empty can test is negative bilaterally.  Liftoff test negative bilaterally.  Left shoulder positive impingement signs.  Imaging: No results found.   PMFS History: Patient Active Problem List   Diagnosis Date Noted   Herpes simplex type 1 infection 10/13/2022   Chronic interstitial cystitis 10/13/2022   Obesity 10/13/2022   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HCC) 05/20/2020   Status post revision of total hip 05/20/2020   Trochanteric bursitis, right hip 09/05/2019   Hyperlipidemia 08/31/2019   Diabetes mellitus (HCC) 08/31/2019   Arthritis 08/31/2019   Overactive bladder 08/31/2019   Hypertensive disorder 08/30/2018   Asthma 08/30/2018   Migraine 08/30/2018   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   Unilateral primary osteoarthritis, right hip 01/27/2017   IUD (intrauterine device)  in place 01/13/2017   Past Medical History:  Diagnosis Date   Arthritis    patient denies   Asthma    Diabetes mellitus without complication (HCC)    type    GERD (gastroesophageal reflux disease)    Hypertension    Interstitial cystitis    IUD (intrauterine device) in place placed 6 weeks ago   Migraines    MIgraines- not current     Pneumonia     Family History  Problem Relation Age of Onset   Diabetes Mother    Heart disease Mother    Diabetes Father    Eczema Sister    Asthma Brother    Eczema Daughter    Migraines Daughter    Migraines Daughter    Asthma Son    Allergic rhinitis Neg Hx    Angioedema Neg Hx    Immunodeficiency Neg Hx     Urticaria Neg Hx    Headache Neg Hx    Sleep apnea Neg Hx     Past Surgical History:  Procedure Laterality Date   ANTERIOR HIP REVISION Right 05/20/2020   Procedure: RIGHT TOTAL HIP REVISION- ploy exhange and hip ball exchange with bone graft;  Surgeon: Kathryne Hitch, MD;  Location: [facility];  Service: Orthopedics;  Laterality: Right;   DILATION AND EVACUATION  09/30/2011   Procedure: DILATATION AND EVACUATION;  Surgeon: Shakerria Andrea, MD;  Location: [facility];  Service: Gynecology;  Laterality: N/A;   left knee surgery      arthroscopy    TOTAL HIP ARTHROPLASTY Right 03/11/2017   Procedure: RIGHT TOTAL HIP ARTHROPLASTY ANTERIOR APPROACH;  Surgeon: Kathryne Hitch, MD;  Location: [facility];  Service: Orthopedics;  Laterality: Right;   Social History   Occupational History   Not on file  Tobacco Use   Smoking status: Never   Smokeless tobacco: Never  Vaping Use   Vaping status: Never Used  Substance and Sexual Activity   Alcohol use: No   Drug use: No   Sexual activity: Not on file

## 2022-11-08 ENCOUNTER — Ambulatory Visit: Payer: Medicare Other | Admitting: Neurology

## 2022-11-08 ENCOUNTER — Other Ambulatory Visit: Payer: Self-pay | Admitting: Internal Medicine

## 2022-11-22 ENCOUNTER — Telehealth: Payer: Self-pay | Admitting: Orthopaedic Surgery

## 2022-12-31 ENCOUNTER — Other Ambulatory Visit: Payer: Self-pay | Admitting: Allergy

## 2022-12-31 ENCOUNTER — Ambulatory Visit (INDEPENDENT_AMBULATORY_CARE_PROVIDER_SITE_OTHER): Payer: Medicare Other | Admitting: Allergy

## 2022-12-31 ENCOUNTER — Encounter: Payer: Self-pay | Admitting: Allergy

## 2022-12-31 ENCOUNTER — Other Ambulatory Visit: Payer: Self-pay

## 2022-12-31 VITALS — BP 118/70 | HR 93 | Temp 98.2°F | Resp 17 | Wt 263.6 lb

## 2022-12-31 DIAGNOSIS — J3089 Other allergic rhinitis: Secondary | ICD-10-CM | POA: Diagnosis not present

## 2022-12-31 DIAGNOSIS — T7800XD Anaphylactic reaction due to unspecified food, subsequent encounter: Secondary | ICD-10-CM | POA: Diagnosis not present

## 2022-12-31 DIAGNOSIS — H1013 Acute atopic conjunctivitis, bilateral: Secondary | ICD-10-CM | POA: Diagnosis not present

## 2022-12-31 DIAGNOSIS — J4540 Moderate persistent asthma, uncomplicated: Secondary | ICD-10-CM | POA: Diagnosis not present

## 2022-12-31 MED ORDER — BUDESONIDE 0.5 MG/2ML IN SUSP: RESPIRATORY_TRACT | 1 refills | Status: DC

## 2022-12-31 MED ORDER — ALBUTEROL SULFATE HFA 108 (90 BASE) MCG/ACT IN AERS: 2.0000 | INHALATION_SPRAY | RESPIRATORY_TRACT | 1 refills | Status: DC | PRN

## 2022-12-31 MED ORDER — OLOPATADINE HCL 0.2 % OP SOLN: 1.0000 [drp] | Freq: Every day | OPHTHALMIC | 1 refills | Status: DC | PRN

## 2022-12-31 MED ORDER — BUDESONIDE-FORMOTEROL FUMARATE 160-4.5 MCG/ACT IN AERO: 2.0000 | INHALATION_SPRAY | Freq: Two times a day (BID) | RESPIRATORY_TRACT | 1 refills | Status: DC

## 2022-12-31 MED ORDER — CETIRIZINE HCL 10 MG PO TABS: 10.0000 mg | ORAL_TABLET | Freq: Every day | ORAL | 1 refills | Status: DC

## 2022-12-31 MED ORDER — EPINEPHRINE 0.3 MG/0.3ML IJ SOAJ: 0.3000 mg | INTRAMUSCULAR | 1 refills | Status: DC | PRN

## 2022-12-31 MED ORDER — IPRATROPIUM BROMIDE 0.06 % NA SOLN: NASAL | 1 refills | Status: AC

## 2022-12-31 MED ORDER — MONTELUKAST SODIUM 10 MG PO TABS: 10.0000 mg | ORAL_TABLET | Freq: Every day | ORAL | 1 refills | Status: DC

## 2022-12-31 NOTE — Progress Notes (Signed)
Follow-up Note  RE: [name] MRN: [id number] DOB: [birth year] Date of Office Visit: 12/31/2022   History of present illness: Emily Hester is a 52 y.o. female presenting today for follow-up of asthma, allergic rhinoconjunctivitis and food allergy.  She was last seen in the office on 10/13/2022 by Dr. Allena Katz.  Discussed the use of AI scribe software for clinical note transcription with the patient, who gave verbal consent to proceed.  Prior to her last visit she reports having wheezing and a sensation of 'rumbling' in the chest. The symptoms began with what seemed like a common cold, but did not improve over time. She reported using her rescue inhaler frequently, but found it unhelpful. As a result, she had a visit with Dr Allena Katz where she was prescribed Pulmicort for nebulization, which she used for approximately a week and a half, in addition to their regular Symbicort. She reported a noticeable improvement in breathing, wheezing, and shortness of breath after starting the Pulmicort.  Now just using her maintenance Symbicort.  Has not had to use albuterol since this illness. She has started to notice a bit more nasal drainage lately. She has been managing these symptoms with Zyrtec, Singulair, and a nasal spray, Atrovent, which she plans to start using again. She has been diligent in avoiding known allergens, such as peanuts and tree nuts, and has not had any accidental ingestions or need to use their EpiPen device.  She has also been proactive in receiving their flu vaccine and plan to receive the COVID-19 booster shot soon.     Review of systems: 10pt ROS negative unless noted above in HPI  All other systems negative unless noted above in HPI  Past medical/social/surgical/family history have been reviewed and are unchanged unless specifically indicated below.  No changes  Medication List: Current Outpatient Medications  Medication Sig Dispense Refill   acetaminophen  (TYLENOL) 500 MG tablet Take 1,000 mg by mouth every 6 (six) hours as needed for moderate pain.     albuterol (PROVENTIL) (2.5 MG/3ML) 0.083% nebulizer solution Take 3 mLs (2.5 mg total) by nebulization every 4 (four) hours as needed for wheezing or shortness of breath. 75 mL 1   amLODipine (NORVASC) 10 MG tablet Take 10 mg by mouth every morning.     aspirin-acetaminophen-caffeine (EXCEDRIN MIGRAINE) 250-250-65 MG tablet Take 2 tablets by mouth every 6 (six) hours as needed for headache.     Cholecalciferol (VITAMIN D) 50 MCG (2000 UT) tablet Take 2,000 Units by mouth daily.     diphenhydrAMINE (BENADRYL) 25 MG tablet Take 25 mg by mouth daily as needed for allergies.     EPINEPHrine (EPIPEN 2-PAK) 0.3 mg/0.3 mL IJ SOAJ injection Inject 0.3 mg into the muscle as needed for anaphylaxis. 0.3 mL 1   EPINEPHrine 0.3 mg/0.3 mL IJ SOAJ injection Inject 0.3 mg into the muscle as directed.     HYDROcodone-acetaminophen (NORCO) 7.5-325 MG tablet Take 1 tablet by mouth every 6 (six) hours as needed for moderate pain. 30 tablet 0   ibuprofen (ADVIL) 200 MG tablet Take 600 mg by mouth every 6 (six) hours as needed for headache or moderate pain.     Lancets (ONETOUCH DELICA PLUS LANCET33G) MISC Apply 1 each topically daily.     levonorgestrel (MIRENA) 20 MCG/24HR IUD 1 each by Intrauterine route once.     lidocaine (LIDODERM) 5 % Place 1 patch onto the skin daily as needed (pain). Remove & Discard patch within 12 hours or as  directed by MD 30 patch 3   losartan (COZAAR) 25 MG tablet Take 25 mg by mouth daily.  0   metFORMIN (GLUCOPHAGE-XR) 750 MG 24 hr tablet Take 750 mg by mouth 2 (two) times daily.     nystatin cream (MYCOSTATIN) APPLY EXTERNALLY TWICE A DAY AS NEEDED 30     Olopatadine HCl (PATADAY) 0.2 % SOLN Place 1 drop into both eyes daily as needed. 7.5 mL 1   omeprazole (PRILOSEC) 40 MG capsule Take 1 capsule by mouth daily.     ondansetron (ZOFRAN) 8 MG tablet Take 8 mg by mouth every 8 (eight) hours  as needed for nausea or vomiting.     ONETOUCH VERIO test strip as directed.   3   oxybutynin (DITROPAN-XL) 10 MG 24 hr tablet Take 10 mg by mouth daily.     rosuvastatin (CRESTOR) 10 MG tablet Take 10 mg by mouth daily.  3   Spacer/Aero-Holding Chambers DEVI Take 1 each by mouth as directed. 1 each 0   tiZANidine (ZANAFLEX) 4 MG tablet TAKE 1 TABLET BY MOUTH EVERY 6 HOURS AS NEEDED FOR MUSCLE SPASMS. 360 tablet 1   traMADol (ULTRAM) 50 MG tablet Take 1 tablet by mouth every 6 (six) hours as needed.     zonisamide (ZONEGRAN) 25 MG capsule Take 50 mg by mouth 2 (two) times daily.     albuterol (VENTOLIN HFA) 108 (90 Base) MCG/ACT inhaler Inhale 2 puffs into the lungs every 4 (four) hours as needed. 18 g 1   budesonide (PULMICORT) 0.5 MG/2ML nebulizer solution With respiratory illness or flare ups, start 0.5mg  nebulized twice daily for 1-2 weeks. 180 mL 1   budesonide-formoterol (SYMBICORT) 160-4.5 MCG/ACT inhaler Inhale 2 puffs into the lungs 2 (two) times daily. 30.6 g 1   butalbital-acetaminophen-caffeine (FIORICET) 50-325-40 MG tablet 1 tablet as needed Orally every 4 hrs for 4 days (Patient not taking: Reported on 12/31/2022)     cetirizine (ZYRTEC) 10 MG tablet Take 1 tablet (10 mg total) by mouth daily. 90 tablet 1   clindamycin (CLEOCIN) 150 MG capsule Take 600 mg by mouth See admin instructions. Take 600 mg 1 hour prior to dental work     diazepam (VALIUM) 5 MG tablet Take 1 by mouth 1 hour  pre-procedure with very light food. May bring 2nd tablet to appointment. 2 tablet 0   gabapentin (NEURONTIN) 100 MG capsule Take 1 capsule (100 mg total) by mouth at bedtime. (Patient not taking: Reported on 12/31/2022) 30 capsule 0   ipratropium (ATROVENT) 0.06 % nasal spray Apply 2 sprays each nostril up to 3-4 times a day as needed 45 mL 1   montelukast (SINGULAIR) 10 MG tablet Take 1 tablet (10 mg total) by mouth at bedtime. 90 tablet 1   MOUNJARO 7.5 MG/0.5ML Pen Inject 7.5 mg into the skin once a  week.     Semaglutide, 2 MG/DOSE, (OZEMPIC, 2 MG/DOSE,) 8 MG/3ML SOPN Inject 2 mg into the skin once a week.     Ubrogepant (UBRELVY) 50 MG TABS Take 1 tablet (50 mg total) by mouth as needed (may repeat once in 2 hours. no more than 2 pills in 24 h.). 10 tablet 3   Vibegron (GEMTESA PO) Take by mouth daily.     No current facility-administered medications for this visit.     Known medication allergies: Allergies  Allergen Reactions   Amoxicillin Hives, Swelling and Other (See Comments)    Has patient had a PCN reaction causing immediate rash,  facial/tongue/throat swelling, SOB or lightheadedness with hypotension: Yes Has patient had a PCN reaction causing severe rash involving mucus membranes or skin necrosis: No Has patient had a PCN reaction that required hospitalization: Yes Has patient had a PCN reaction occurring within the last 10 years: No  If all of the above answers are "NO", then may proceed with Cephalosporin use.    Lisinopril Itching and Cough    Heart racing    Olmesartan     jittery   Bactrim Rash     Physical examination: Blood pressure 118/70, pulse 93, temperature 98.2 F (36.8 C), temperature source Temporal, resp. rate 17, weight 263 lb 10.2 oz (119.6 kg), SpO2 99%.  General: Alert, interactive, in no acute distress. HEENT: PERRLA, TMs pearly gray, turbinates mildly edematous without discharge, post-pharynx non erythematous. Neck: Supple without lymphadenopathy. Lungs: Clear to auscultation without wheezing, rhonchi or rales. {no increased work of breathing. CV: Normal S1, S2 without murmurs. Abdomen: Nondistended, nontender. Skin: Warm and dry, without lesions or rashes. Extremities:  No clubbing, cyanosis or edema. Neuro:   Grossly intact.  Diagnositics/Labs: None today   Assessment and plan: Asthma - Asthma action plan (if having respiratory illness or flare): Pulmicort nebulizer 0.5mg  twice daily for 1-2 weeks.   - continue Symbicort 2  puffs twice a day with spacer to help prevent cough and wheeze.   - continue Singulair (montelukast) 10mg  nightly - have access to albuterol inhaler 2 puffs every 4-6 hours as needed for cough/wheeze/shortness of breath/chest tightness.  May use 15-20 minutes prior to activity.   Monitor frequency of use.  - recommend use of your albuterol prior to outdoor activity, grilling etc - continue to do your best to avoid fragrance products Asthma control goals:  Full participation in all desired activities (may need albuterol before activity) Albuterol use two time or less a week on average (not counting use with activity) Cough interfering with sleep two time or less a month Oral steroids no more than once a year No hospitalizations  Allergic Rhinitis with conjunctivitis - continue allergen avoidance measures - continue Zyrtec (Cetirizine) 10 mg daily  - continue Singulair (montelukast) nightly - for nasal drainage/post-nasal drip use nasal Atrovent 0.06% 2 sprays each nostril up to 3-4 times a day as needed.   Recommend using in evening/prior to bedtime.  - use over-the-counter Pataday Xtra Strength, Pataday 1 drop each eye daily or Zaditor or Alaway 1 drop each eye twice a day as needed for itchy/watery/red eyes.   These are all over-the-counter allergy eyedrop you can use.   Food allergy - continue to avoid peanuts and tree nuts - in case of an allergic reaction, give Benadryl 4 teaspoonfuls every 4 hours, and if life-threatening symptoms occur, inject with EpiPen 0.3 mg  Follow-up in 6 months or sooner if needed   I appreciate the opportunity to take part in Braidyn's care. Please do not hesitate to contact me with questions.  Sincerely,   Margo Aye, MD Allergy/Immunology [facility]

## 2022-12-31 NOTE — Patient Instructions (Addendum)
Asthma - Asthma action plan (if having respiratory illness or flare): Pulmicort nebulizer 0.5mg  twice daily for 1-2 weeks.   - continue Symbicort 2 puffs twice a day with spacer to help prevent cough and wheeze.   - continue Singulair (montelukast) 10mg  nightly - have access to albuterol inhaler 2 puffs every 4-6 hours as needed for cough/wheeze/shortness of breath/chest tightness.  May use 15-20 minutes prior to activity.   Monitor frequency of use.  - recommend use of your albuterol prior to outdoor activity, grilling etc - continue to do your best to avoid fragrance products Asthma control goals:  Full participation in all desired activities (may need albuterol before activity) Albuterol use two time or less a week on average (not counting use with activity) Cough interfering with sleep two time or less a month Oral steroids no more than once a year No hospitalizations  Allergic Rhinitis with conjunctivitis - continue allergen avoidance measures - continue Zyrtec (Cetirizine) 10 mg daily  - continue Singulair (montelukast) nightly - for nasal drainage/post-nasal drip use nasal Atrovent 0.06% 2 sprays each nostril up to 3-4 times a day as needed.   Recommend using in evening/prior to bedtime.  - use over-the-counter Pataday Xtra Strength, Pataday 1 drop each eye daily or Zaditor or Alaway 1 drop each eye twice a day as needed for itchy/watery/red eyes.   These are all over-the-counter allergy eyedrop you can use.   Food allergy - continue to avoid peanuts and tree nuts - in case of an allergic reaction, give Benadryl 4 teaspoonfuls every 4 hours, and if life-threatening symptoms occur, inject with EpiPen 0.3 mg  Follow-up in 6 months or sooner if needed

## 2023-01-10 ENCOUNTER — Encounter: Payer: Self-pay | Admitting: Physical Medicine and Rehabilitation

## 2023-01-11 ENCOUNTER — Telehealth: Payer: Self-pay

## 2023-01-11 NOTE — Telephone Encounter (Signed)
Spoke with patient and scheduled OV for 01/12/23.

## 2023-01-11 NOTE — Telephone Encounter (Signed)
-----   Message from [place], [place] E sent at 01/10/2023  2:56 PM EDT ----- Can we call her and schedule OV? Short term relief with ablation.

## 2023-01-12 ENCOUNTER — Ambulatory Visit (INDEPENDENT_AMBULATORY_CARE_PROVIDER_SITE_OTHER): Payer: Managed Care, Other (non HMO) | Admitting: Physical Medicine and Rehabilitation

## 2023-01-12 ENCOUNTER — Encounter: Payer: Self-pay | Admitting: Physical Medicine and Rehabilitation

## 2023-01-12 VITALS — BP 112/70 | HR 86

## 2023-01-12 DIAGNOSIS — M545 Low back pain, unspecified: Secondary | ICD-10-CM | POA: Diagnosis not present

## 2023-01-12 DIAGNOSIS — M47816 Spondylosis without myelopathy or radiculopathy, lumbar region: Secondary | ICD-10-CM

## 2023-01-12 DIAGNOSIS — G8929 Other chronic pain: Secondary | ICD-10-CM | POA: Diagnosis not present

## 2023-01-12 MED ORDER — DIAZEPAM 5 MG PO TABS: ORAL_TABLET | ORAL | 0 refills | Status: DC

## 2023-01-12 MED ORDER — TRAMADOL HCL 50 MG PO TABS: 50.0000 mg | ORAL_TABLET | Freq: Three times a day (TID) | ORAL | 0 refills | Status: DC | PRN

## 2023-01-12 NOTE — Progress Notes (Unsigned)
Functional Pain Scale - descriptive words and definitions  Intense (8)    Cannot complete any ADLs without much assistance/cannot concentrate/conversation is difficult/unable to sleep and unable to use distraction. Severe range order  Average Pain 8   LBP, follow up after RF ablation. Only had a couple months of relief post ablation.

## 2023-01-12 NOTE — Progress Notes (Addendum)
Emily Hester - 52 y.o. female MRN [id number]  Date of birth: 06-03-1970  Office Visit Note: Visit Date: 01/12/2023 PCP: Merri Brunette, MD Referred by: Merri Brunette, MD  Subjective: Chief Complaint  Patient presents with   Lower Back - Pain   HPI: Emily Hester is a 52 y.o. female who comes in today for evaluation of chronic, worsening and severe bilateral lower back pain, left greater than right. Pain ongoing for several years, worsens with standing and activity. Pain is severe when moving from sitting to standing position. She describes her pain as sore and aching, currently rates as 8 out of 10. She has tried home exercise regimen, rest and medications with minimal relief of pain. Has attended formal physical therapy in the past with no relief of pain. Lumbar MRI imaging from 2022 exhibits mild degenerative change at L1-L2, L4-L5, and L5-S1, facet arthropathy more prominent bilaterally at level of L4-L5. No stenosis/nerve impingement noted. Patient has undergone multiple injections in our office over the years including lumbar epidural steroid injections, right intra-articular hip injection and right greater trochanter injection. She reports intermittent relief of pain with lumbar injections. Patient underwent bilateral L4-L5 radiofrequency ablation in our office on 05/27/2022, she reports greater than 80% relief for over 6 months with this procedure. She also reports increased functional ability post ablation. States her pain has started to gradually increase over the last several weeks and is beginning to negatively impact her daily life. Patient denies focal weakness, numbness and tingling. No recent trauma or falls.    Review of Systems  Musculoskeletal:  Positive for back pain.  Neurological:  Negative for tingling, sensory change, focal weakness and weakness.  All other systems reviewed and are negative.  Otherwise per HPI.  Assessment & Plan: Visit Diagnoses:     ICD-10-CM   1. Chronic bilateral low back pain without sciatica  M54.50 Ambulatory referral to Physical Medicine Rehab   G89.29     2. Spondylosis without myelopathy or radiculopathy, lumbar region  M47.816 Ambulatory referral to Physical Medicine Rehab    3. Facet hypertrophy of lumbar region  M47.816 Ambulatory referral to Physical Medicine Rehab       Plan: Findings:  Chronic, worsening and severe bilateral axial back pain, left greater than right.  Patient continues to have severe pain despite good conservative therapies such as formal physical therapy, home exercise regimen, rest and use of medications.  Patient's clinical presentation and exam are consistent with facet mediated pain.  She does have pain with lumbar extension upon exam today. There is facet arthropathy at the level of L4-L5. We discussed treatment plan in detail today, next step is to repeat bilateral L4-L5 radiofrequency ablation under fluoroscopic guidance. She did voice anxiety related to procedure, I did write or pre-procedure Valium for her to take on day of ablation. We also discussed medication management and I prescribed short course of Tramadol until she is able to come back for procedure. Could also look at re-grouping with physical therapy in the future. She has no questions at this time. No red flag symptoms noted upon exam today.      Meds & Orders:  Meds ordered this encounter  Medications   diazepam (VALIUM) 5 MG tablet    Sig: Take one tablet by mouth with food one hour prior to procedure. May repeat 30 minutes prior if needed.    Dispense:  2 tablet    Refill:  0   traMADol (ULTRAM) 50 MG tablet  Sig: Take 1 tablet (50 mg total) by mouth every 8 (eight) hours as needed for moderate pain (pain score 4-6) or severe pain (pain score 7-10).    Dispense:  20 tablet    Refill:  0    Orders Placed This Encounter  Procedures   Ambulatory referral to Physical Medicine Rehab    Follow-up: Return for  Bilateral L4-L5 radiofrequency ablation.   Procedures: No procedures performed      Clinical History: MRI LUMBAR SPINE WITHOUT CONTRAST    TECHNIQUE:  Multiplanar, multisequence MR imaging of the lumbar spine was  performed. No intravenous contrast was administered.    COMPARISON:  MRI lumbar spine February 12, 2020.    FINDINGS:  Segmentation: Standard.    Alignment:  Normal.    Vertebrae: No focal marrow edema to suggest acute fracture or  discitis/osteomyelitis. No suspicious bone lesions.    Conus medullaris and cauda equina: Conus extends to the L1 level.  Conus and cauda equina appear normal.    Paraspinal and other soft tissues: Unremarkable.    Disc levels:    T12-L1: No significant disc protrusion, foraminal stenosis, or canal  stenosis.    L1-L2: Similar mild disc desiccation height loss. Similar mild disc  bulge without significant stenosis.    L2-L3: No significant disc protrusion, foraminal stenosis, or canal  stenosis.    L3-L4: No significant disc protrusion, foraminal stenosis, or canal  stenosis.    L4-L5: Similar slight disc bulging and bilateral facet arthropathy  with ligamentum flavum thickening. No significant canal or foraminal  stenosis.    L5-S1: Similar mild disc bulge with annular fissure. No significant  stenosis.    IMPRESSION:  Similar mild degenerative change at L1-L2, L4-L5, and L5-S1  (detailed above) without significant stenosis.      Electronically Signed    By: Feliberto Harts M.D.    On: 12/28/2020 14:42   She reports that she has never smoked. She has been exposed to tobacco smoke. She has never used smokeless tobacco. No results for input(s): "HGBA1C", "LABURIC" in the last 8760 hours.  Objective:  VS:  HT:    WT:   BMI:     BP:112/70  HR:86bpm  TEMP: ( )  RESP:  Physical Exam Vitals and nursing note reviewed.  HENT:     Head: Normocephalic and atraumatic.     Right Ear: External ear normal.     Left Ear:  External ear normal.     Nose: Nose normal.     Mouth/Throat:     Mouth: Mucous membranes are moist.  Eyes:     Extraocular Movements: Extraocular movements intact.  Cardiovascular:     Rate and Rhythm: Normal rate.     Pulses: Normal pulses.  Pulmonary:     Effort: Pulmonary effort is normal.  Abdominal:     General: Abdomen is flat. There is no distension.  Musculoskeletal:        General: Tenderness present.     Cervical back: Normal range of motion.     Comments: Patient rises from seated position to standing without difficulty. Pain noted with facet loading and lumbar extension. 5/5 strength noted with bilateral hip flexion, knee flexion/extension, ankle dorsiflexion/plantarflexion and EHL. No clonus noted bilaterally. No pain upon palpation of greater trochanters. No pain with internal/external rotation of bilateral hips. Sensation intact bilaterally. Negative slump test bilaterally. Ambulates without aid, gait steady.     Skin:    General: Skin is warm and dry.  Capillary Refill: Capillary refill takes less than 2 seconds.  Neurological:     General: No focal deficit present.     Mental Status: She is alert and oriented to person, place, and time.  Psychiatric:        Mood and Affect: Mood normal.        Behavior: Behavior normal.     Ortho Exam  Imaging: No results found. ons updated.

## 2023-01-25 ENCOUNTER — Ambulatory Visit (INDEPENDENT_AMBULATORY_CARE_PROVIDER_SITE_OTHER): Payer: Managed Care, Other (non HMO) | Admitting: Physical Medicine and Rehabilitation

## 2023-01-25 ENCOUNTER — Other Ambulatory Visit: Payer: Self-pay

## 2023-01-25 DIAGNOSIS — M47816 Spondylosis without myelopathy or radiculopathy, lumbar region: Secondary | ICD-10-CM

## 2023-01-25 MED ORDER — METHYLPREDNISOLONE ACETATE 40 MG/ML IJ SUSP
40.0000 mg | Freq: Once | INTRAMUSCULAR | Status: AC
Start: 2023-01-25 — End: 2023-01-25
  Administered 2023-01-25: 40 mg

## 2023-01-25 NOTE — Patient Instructions (Signed)

## 2023-01-28 ENCOUNTER — Other Ambulatory Visit: Payer: Self-pay | Admitting: Allergy

## 2023-02-02 NOTE — Procedures (Signed)
Lumbar Facet Joint Nerve Denervation  [name]      Date of Birth: 1971-03-03 MRN: [id number] PCP: Merri Brunette, MD      Visit Date: 01/25/2023   Universal Protocol:    Date/Time: 11/20/247:20 AM  Consent Given By: the patient  Position: PRONE  Additional Comments: Vital signs were monitored before and after the procedure. Patient was prepped and draped in the usual sterile fashion. The correct patient, procedure, and site was verified.   Injection Procedure Details:   Procedure diagnoses:  1. Spondylosis without myelopathy or radiculopathy, lumbar region      Meds Administered:  Meds ordered this encounter  Medications   methylPREDNISolone acetate (DEPO-MEDROL) injection 40 mg     Laterality: Bilateral  Location/Site:  L4-L5, L3 and L4 medial branches  Needle: 18 ga.,  10mm active tip, RF Cannula  Needle Placement: Along juncture of superior articular process and transverse pocess  Findings:  -Comments:  Procedure Details: For each desired target nerve, the corresponding transverse process (sacral ala for the L5 dorsal rami) was identified and the fluoroscope was positioned to square off the endplates of the corresponding vertebral body to achieve a true AP midline view.  The beam was then obliqued 15 to 20 degrees and caudally tilted 15 to 20 degrees to line up a trajectory along the target nerves. The skin over the target of the junction of superior articulating process and transverse process (sacral ala for the L5 dorsal rami) was infiltrated with 1ml of 1% Lidocaine without Epinephrine.  The 18 gauge 10mm active tip outer cannula was advanced in trajectory view to the target.  This procedure was repeated for each target nerve.  Then, for all levels, the outer cannula placement was fine-tuned and the position was then confirmed with bi-planar imaging.    Test stimulation was done both at sensory and motor levels to ensure there was no  radicular stimulation. The target tissues were then infiltrated with 1 ml of 1% Lidocaine without Epinephrine. Subsequently, a percutaneous neurotomy was carried out for 90 seconds at 80 degrees Celsius.  After the completion of the lesion, 1 ml of injectate was delivered. It was then repeated for each facet joint nerve mentioned above. Appropriate radiographs were obtained to verify the probe placement during the neurotomy.   Additional Comments:  The patient tolerated the procedure well Dressing: 2 x 2 sterile gauze and Band-Aid    Post-procedure details: Patient was observed during the procedure. Post-procedure instructions were reviewed.  Patient left the clinic in stable condition.

## 2023-02-02 NOTE — Progress Notes (Signed)
Emily Hester - 52 y.o. female MRN [id number]  Date of birth: May 05, 1970  Office Visit Note: Visit Date: 01/25/2023 PCP: Merri Brunette, MD Referred by: Merri Brunette, MD  Subjective: Chief Complaint  Patient presents with   Lower Back - Pain   HPI:  Emily Hester is a 52 y.o. female who comes in todayfor planned repeat radiofrequency ablation of the Bilateral L4-5  Lumbar facet joints. This would be ablation of the corresponding medial branches and/or dorsal rami.  Patient has had double diagnostic blocks with more than 70% relief.  Subsequent ablation gave them more than 6 months of over 60% relief.  They have had chronic back pain for quite some time, more than 3 months, which has been an ongoing situation with recalcitrant axial back pain.  They have no radicular pain.  Their axial pain is worse with standing and ambulating and on exam today with facet loading.  They have had physical therapy as well as home exercise program.  The imaging noted in the chart below indicated facet pathology. Accordingly they meet all the criteria and qualification for for radiofrequency ablation and we are going to complete this today hopefully for more longer term relief as part of comprehensive management program.   ROS Otherwise per HPI.  Assessment & Plan: Visit Diagnoses:    ICD-10-CM   1. Spondylosis without myelopathy or radiculopathy, lumbar region  M47.816 XR C-ARM NO REPORT    Radiofrequency,Lumbar    methylPREDNISolone acetate (DEPO-MEDROL) injection 40 mg      Plan: No additional findings.   Meds & Orders:  Meds ordered this encounter  Medications   methylPREDNISolone acetate (DEPO-MEDROL) injection 40 mg    Orders Placed This Encounter  Procedures   Radiofrequency,Lumbar   XR C-ARM NO REPORT    Follow-up: Return if symptoms worsen or fail to improve.   Procedures: No procedures performed  Lumbar Facet Joint Nerve Denervation  [name]      Date of Birth: 1970/05/02 MRN: [id number] PCP: Merri Brunette, MD      Visit Date: 01/25/2023   Universal Protocol:    Date/Time: 11/20/247:20 AM  Consent Given By: the patient  Position: PRONE  Additional Comments: Vital signs were monitored before and after the procedure. Patient was prepped and draped in the usual sterile fashion. The correct patient, procedure, and site was verified.   Injection Procedure Details:   Procedure diagnoses:  1. Spondylosis without myelopathy or radiculopathy, lumbar region      Meds Administered:  Meds ordered this encounter  Medications   methylPREDNISolone acetate (DEPO-MEDROL) injection 40 mg     Laterality: Bilateral  Location/Site:  L4-L5, L3 and L4 medial branches  Needle: 18 ga.,  10mm active tip, RF Cannula  Needle Placement: Along juncture of superior articular process and transverse pocess  Findings:  -Comments:  Procedure Details: For each desired target nerve, the corresponding transverse process (sacral ala for the L5 dorsal rami) was identified and the fluoroscope was positioned to square off the endplates of the corresponding vertebral body to achieve a true AP midline view.  The beam was then obliqued 15 to 20 degrees and caudally tilted 15 to 20 degrees to line up a trajectory along the target nerves. The skin over the target of the junction of superior articulating process and transverse process (sacral ala for the L5 dorsal rami) was infiltrated with 1ml of 1% Lidocaine without Epinephrine.  The 18 gauge 10mm active tip outer cannula was  advanced in trajectory view to the target.  This procedure was repeated for each target nerve.  Then, for all levels, the outer cannula placement was fine-tuned and the position was then confirmed with bi-planar imaging.    Test stimulation was done both at sensory and motor levels to ensure there was no radicular stimulation. The target tissues were then  infiltrated with 1 ml of 1% Lidocaine without Epinephrine. Subsequently, a percutaneous neurotomy was carried out for 90 seconds at 80 degrees Celsius.  After the completion of the lesion, 1 ml of injectate was delivered. It was then repeated for each facet joint nerve mentioned above. Appropriate radiographs were obtained to verify the probe placement during the neurotomy.   Additional Comments:  The patient tolerated the procedure well Dressing: 2 x 2 sterile gauze and Band-Aid    Post-procedure details: Patient was observed during the procedure. Post-procedure instructions were reviewed.  Patient left the clinic in stable condition.      Clinical History: MRI LUMBAR SPINE WITHOUT CONTRAST    TECHNIQUE:  Multiplanar, multisequence MR imaging of the lumbar spine was  performed. No intravenous contrast was administered.    COMPARISON:  MRI lumbar spine February 12, 2020.    FINDINGS:  Segmentation: Standard.    Alignment:  Normal.    Vertebrae: No focal marrow edema to suggest acute fracture or  discitis/osteomyelitis. No suspicious bone lesions.    Conus medullaris and cauda equina: Conus extends to the L1 level.  Conus and cauda equina appear normal.    Paraspinal and other soft tissues: Unremarkable.    Disc levels:    T12-L1: No significant disc protrusion, foraminal stenosis, or canal  stenosis.    L1-L2: Similar mild disc desiccation height loss. Similar mild disc  bulge without significant stenosis.    L2-L3: No significant disc protrusion, foraminal stenosis, or canal  stenosis.    L3-L4: No significant disc protrusion, foraminal stenosis, or canal  stenosis.    L4-L5: Similar slight disc bulging and bilateral facet arthropathy  with ligamentum flavum thickening. No significant canal or foraminal  stenosis.    L5-S1: Similar mild disc bulge with annular fissure. No significant  stenosis.    IMPRESSION:  Similar mild degenerative change at L1-L2,  L4-L5, and L5-S1  (detailed above) without significant stenosis.      Electronically Signed    By: Feliberto Harts M.D.    On: 12/28/2020 14:42     Objective:  VS:  HT:    WT:   BMI:     BP:   HR: bpm  TEMP: ( )  RESP:  Physical Exam Vitals and nursing note reviewed.  Constitutional:      General: She is not in acute distress.    Appearance: Normal appearance. She is obese. She is not ill-appearing.  HENT:     Head: Normocephalic and atraumatic.     Right Ear: External ear normal.     Left Ear: External ear normal.  Eyes:     Extraocular Movements: Extraocular movements intact.  Cardiovascular:     Rate and Rhythm: Normal rate.     Pulses: Normal pulses.  Pulmonary:     Effort: Pulmonary effort is normal. No respiratory distress.  Abdominal:     General: There is no distension.     Palpations: Abdomen is soft.  Musculoskeletal:        General: Tenderness present.     Cervical back: Neck supple.     Right lower leg: No edema.  Left lower leg: No edema.     Comments: Patient has good distal strength with no pain over the greater trochanters.  No clonus or focal weakness.  Skin:    Findings: No erythema, lesion or rash.  Neurological:     General: No focal deficit present.     Mental Status: She is alert and oriented to person, place, and time.     Sensory: No sensory deficit.     Motor: No weakness or abnormal muscle tone.     Coordination: Coordination normal.  Psychiatric:        Mood and Affect: Mood normal.        Behavior: Behavior normal.      Imaging: No results found.

## 2023-03-13 ENCOUNTER — Emergency Department (HOSPITAL_COMMUNITY): Payer: Managed Care, Other (non HMO)

## 2023-03-13 ENCOUNTER — Encounter (HOSPITAL_COMMUNITY): Payer: Self-pay

## 2023-03-13 ENCOUNTER — Other Ambulatory Visit: Payer: Self-pay

## 2023-03-13 ENCOUNTER — Emergency Department (HOSPITAL_COMMUNITY)
Admission: EM | Admit: 2023-03-13 | Discharge: 2023-03-13 | Disposition: A | Discharge status: Home / Self Care | Payer: Managed Care, Other (non HMO) | Attending: Emergency Medicine | Admitting: Emergency Medicine

## 2023-03-13 DIAGNOSIS — Z79899 Other long term (current) drug therapy: Secondary | ICD-10-CM | POA: Insufficient documentation

## 2023-03-13 DIAGNOSIS — R791 Abnormal coagulation profile: Secondary | ICD-10-CM | POA: Diagnosis not present

## 2023-03-13 DIAGNOSIS — J45909 Unspecified asthma, uncomplicated: Secondary | ICD-10-CM | POA: Diagnosis not present

## 2023-03-13 DIAGNOSIS — E119 Type 2 diabetes mellitus without complications: Secondary | ICD-10-CM | POA: Insufficient documentation

## 2023-03-13 DIAGNOSIS — I10 Essential (primary) hypertension: Secondary | ICD-10-CM | POA: Insufficient documentation

## 2023-03-13 DIAGNOSIS — R4781 Slurred speech: Secondary | ICD-10-CM | POA: Diagnosis present

## 2023-03-13 DIAGNOSIS — G43109 Migraine with aura, not intractable, without status migrainosus: Secondary | ICD-10-CM

## 2023-03-13 DIAGNOSIS — R519 Headache, unspecified: Secondary | ICD-10-CM | POA: Insufficient documentation

## 2023-03-13 DIAGNOSIS — Z96641 Presence of right artificial hip joint: Secondary | ICD-10-CM | POA: Insufficient documentation

## 2023-03-13 LAB — COMPREHENSIVE METABOLIC PANEL
ALT: 20 U/L (ref 0–44)
AST: 22 U/L (ref 15–41)
Albumin: 3.7 g/dL (ref 3.5–5.0)
Alkaline Phosphatase: 103 U/L (ref 38–126)
Anion gap: 9 (ref 5–15)
BUN: 7 mg/dL (ref 6–20)
CO2: 22 mmol/L (ref 22–32)
Calcium: 9 mg/dL (ref 8.9–10.3)
Chloride: 106 mmol/L (ref 98–111)
Creatinine, Ser: 0.82 mg/dL (ref 0.44–1.00)
GFR, Estimated: >60 mL/min (ref >60)
Glucose, Bld: 126 mg/dL — ABNORMAL HIGH (ref 70–99)
Potassium: 3.8 mmol/L (ref 3.5–5.1)
Sodium: 137 mmol/L (ref 135–145)
Total Bilirubin: 0.5 mg/dL (ref <1.2)
Total Protein: 7.2 g/dL (ref 6.5–8.1)

## 2023-03-13 LAB — I-STAT CHEM 8, ED
BUN: 8 mg/dL (ref 6–20)
Calcium, Ion: 1.14 mmol/L — ABNORMAL LOW (ref 1.15–1.40)
Chloride: 103 mmol/L (ref 98–111)
Creatinine, Ser: 0.8 mg/dL (ref 0.44–1.00)
Glucose, Bld: 127 mg/dL — ABNORMAL HIGH (ref 70–99)
HCT: 43 % (ref 36.0–46.0)
Hemoglobin: 14.6 g/dL (ref 12.0–15.0)
Potassium: 3.8 mmol/L (ref 3.5–5.1)
Sodium: 139 mmol/L (ref 135–145)
TCO2: 25 mmol/L (ref 22–32)

## 2023-03-13 LAB — DIFFERENTIAL
Abs Immature Granulocytes: 0.01 10*3/uL (ref 0.00–0.07)
Basophils Absolute: 0 10*3/uL (ref 0.0–0.1)
Basophils Relative: 1 %
Eosinophils Absolute: 0.2 10*3/uL (ref 0.0–0.5)
Eosinophils Relative: 2 %
Immature Granulocytes: 0 %
Lymphocytes Relative: 39 %
Lymphs Abs: 3.4 10*3/uL (ref 0.7–4.0)
Monocytes Absolute: 0.6 10*3/uL (ref 0.1–1.0)
Monocytes Relative: 7 %
Neutro Abs: 4.5 10*3/uL (ref 1.7–7.7)
Neutrophils Relative %: 51 %

## 2023-03-13 LAB — CBC
HCT: 42.2 % (ref 36.0–46.0)
Hemoglobin: 13.4 g/dL (ref 12.0–15.0)
MCH: 27 pg (ref 26.0–34.0)
MCHC: 31.8 g/dL (ref 30.0–36.0)
MCV: 85.1 fL (ref 80.0–100.0)
Platelets: 278 10*3/uL (ref 150–400)
RBC: 4.96 MIL/uL (ref 3.87–5.11)
RDW: 16.3 % — ABNORMAL HIGH (ref 11.5–15.5)
WBC: 8.8 10*3/uL (ref 4.0–10.5)
nRBC: 0 % (ref 0.0–0.2)

## 2023-03-13 LAB — RAPID URINE DRUG SCREEN, HOSP PERFORMED
Amphetamines: NOT DETECTED
Barbiturates: NOT DETECTED
Benzodiazepines: NOT DETECTED
Cocaine: NOT DETECTED
Opiates: NOT DETECTED
Tetrahydrocannabinol: NOT DETECTED

## 2023-03-13 LAB — ETHANOL: Alcohol, Ethyl (B): <10 mg/dL (ref <10)

## 2023-03-13 LAB — HCG, SERUM, QUALITATIVE: Preg, Serum: NEGATIVE

## 2023-03-13 LAB — CBG MONITORING, ED: Glucose-Capillary: 129 mg/dL — ABNORMAL HIGH (ref 70–99)

## 2023-03-13 LAB — APTT: aPTT: 25 s (ref 24–36)

## 2023-03-13 LAB — PROTIME-INR
INR: 0.9 (ref 0.8–1.2)
Prothrombin Time: 12.6 s (ref 11.4–15.2)

## 2023-03-13 MED ORDER — PROCHLORPERAZINE EDISYLATE 10 MG/2ML IJ SOLN
10.0000 mg | Freq: Once | INTRAMUSCULAR | Status: AC
  Administered 2023-03-13: 10 mg via INTRAVENOUS
  Filled 2023-03-13: qty 2

## 2023-03-13 MED ORDER — KETOROLAC TROMETHAMINE 15 MG/ML IJ SOLN
15.0000 mg | Freq: Once | INTRAMUSCULAR | Status: AC
Start: 2023-03-13 — End: 2023-03-13
  Administered 2023-03-13: 15 mg via INTRAVENOUS
  Filled 2023-03-13: qty 1

## 2023-03-13 MED ORDER — SODIUM CHLORIDE 0.9% FLUSH
3.0000 mL | Freq: Once | INTRAVENOUS | Status: AC
  Administered 2023-03-13: 3 mL via INTRAVENOUS

## 2023-03-13 MED ORDER — DIPHENHYDRAMINE HCL 50 MG/ML IJ SOLN
25.0000 mg | Freq: Once | INTRAMUSCULAR | Status: AC
  Administered 2023-03-13: 25 mg via INTRAVENOUS
  Filled 2023-03-13: qty 1

## 2023-03-13 MED ORDER — SODIUM CHLORIDE 0.9 % IV BOLUS
1000.0000 mL | Freq: Once | INTRAVENOUS | Status: AC
  Administered 2023-03-13: 1000 mL via INTRAVENOUS

## 2023-03-13 NOTE — ED Triage Notes (Signed)
Pt arrived from home via [facility] status code stroke [id number] at that time speech became slurred and pt had a headache. Answers questions appropriately, just slightly delayed. Pt states that she took a tramadol tonight and had a cocktail

## 2023-03-13 NOTE — Discharge Instructions (Signed)
You were evaluated in the Emergency Department and after careful evaluation, we did not find any emergent condition requiring admission or further testing in the hospital.  Your exam/testing today is overall reassuring.  Recommend follow-up with your neurologist to discuss her symptoms.  Please return to the Emergency Department if you experience any worsening of your condition.   Thank you for allowing [name] to be a part of your care.

## 2023-03-13 NOTE — Consult Note (Addendum)
NEUROLOGY CONSULT NOTE   Date of service: March 13, 2023 Patient Name: Emily Hester MRN:  [id number] DOB:  04-27-1970 Chief Complaint: "Headache, slurred speech" Requesting Provider: Sabas Sous, MD  History of Present Illness  Emily Hester is a 52 y.o. female  has a past medical history of Arthritis, Asthma, Diabetes mellitus without complication (HCC), GERD (gastroesophageal reflux disease), Hypertension, Interstitial cystitis, IUD (intrauterine device) in place (placed 6 weeks ago), Migraines, and Pneumonia.   Also with a prior history of complex migraine, currently on zonisamide and tramadol as needed as well as trigger point injections for headache provided by her headache specialist at the headache center brought in for evaluation of sudden onset of slurred speech noted by family around 12:40 AM.  Complains of headache that has now started to become better.  No other focal deficits.  Given slurred speech and within the window for intervention per stroke protocol-code stroke was activated Of note, patient usually does not consume alcohol but had imbibed a cocktail today.   Reports compliance to medications.  Usually does not take tramadol but today had taken a tramadol this evening as well. No recent fevers or chills.  No recent sick contacts.   LKW: 12:40 AM Modified rankin score: 0-Completely asymptomatic and back to baseline post- stroke IV Thrombolysis: More likely stroke mimic-MRI negative EVT: As above  NIHSS components Score: Comment  1a Level of Conscious 0[x]  1[]  2[]  3[]      1b LOC Questions 0[x]  1[]  2[]       1c LOC Commands 0[x]  1[]  2[]       2 Best Gaze 0[x]  1[]  2[]       3 Visual 0[x]  1[]  2[]  3[]      4 Facial Palsy 0[x]  1[]  2[]  3[]      5a Motor Arm - left 0[x]  1[]  2[]  3[]  4[]  UN[]    5b Motor Arm - Right 0[x]  1[]  2[]  3[]  4[]  UN[]    6a Motor Leg - Left 0[x]  1[]  2[]  3[]  4[]  UN[]    6b Motor Leg - Right 0[x]  1[]  2[]  3[]  4[]  UN[]    7 Limb Ataxia 0[x]  1[]   2[]  3[]  UN[]     8 Sensory 0[x]  1[]  2[]  UN[]      9 Best Language 0[x]  1[]  2[]  3[]      10 Dysarthria 0[]  1[x]  2[]  UN[]      11 Extinct. and Inattention 0[x]  1[]  2[]       TOTAL: 1      ROS  Comprehensive ROS performed and pertinent positives documented in HPI   Past History   Past Medical History:  Diagnosis Date   Arthritis    patient denies   Asthma    Diabetes mellitus without complication (HCC)    type    GERD (gastroesophageal reflux disease)    Hypertension    Interstitial cystitis    IUD (intrauterine device) in place placed 6 weeks ago   Migraines    MIgraines- not current     Pneumonia     Past Surgical History:  Procedure Laterality Date   ANTERIOR HIP REVISION Right 05/20/2020   Procedure: RIGHT TOTAL HIP REVISION- ploy exhange and hip ball exchange with bone graft;  Surgeon: Kathryne Hitch, MD;  [facility];  Service: Orthopedics;  Laterality: Right;   DILATION AND EVACUATION  09/30/2011   Procedure: DILATATION AND EVACUATION;  Surgeon: Deshondra Andrea, MD;  Location: [facility];  Service: Gynecology;  Laterality: N/A;   left knee surgery      arthroscopy  TOTAL HIP ARTHROPLASTY Right 03/11/2017   Procedure: RIGHT TOTAL HIP ARTHROPLASTY ANTERIOR APPROACH;  Surgeon: Kathryne Hitch, MD;  Location: [facility];  Service: Orthopedics;  Laterality: Right;    Family History: Family History  Problem Relation Age of Onset   Diabetes Mother    Heart disease Mother    Diabetes Father    Eczema Sister    Asthma Brother    Eczema Daughter    Migraines Daughter    Migraines Daughter    Asthma Son    Allergic rhinitis Neg Hx    Angioedema Neg Hx    Immunodeficiency Neg Hx    Urticaria Neg Hx    Headache Neg Hx    Sleep apnea Neg Hx     Social History  reports that she has never smoked. She has been exposed to tobacco smoke. She has never used smokeless tobacco. She reports that she does not drink alcohol and does not use drugs.  Allergies   Allergen Reactions   Amoxicillin Hives, Swelling and Other (See Comments)    Has patient had a PCN reaction causing immediate rash, facial/tongue/throat swelling, SOB or lightheadedness with hypotension: Yes Has patient had a PCN reaction causing severe rash involving mucus membranes or skin necrosis: No Has patient had a PCN reaction that required hospitalization: Yes Has patient had a PCN reaction occurring within the last 10 years: No  If all of the above answers are "NO", then may proceed with Cephalosporin use.    Lisinopril Itching and Cough    Heart racing    Olmesartan     jittery   Bactrim Rash    Medications   Current Facility-Administered Medications:    sodium chloride flush (NS) 0.9 % injection 3 mL, 3 mL, Intravenous, Once, Bero, Elmer Sow, MD  Current Outpatient Medications:    acetaminophen (TYLENOL) 500 MG tablet, Take 1,000 mg by mouth every 6 (six) hours as needed for moderate pain., Disp: , Rfl:    albuterol (PROVENTIL) (2.5 MG/3ML) 0.083% nebulizer solution, Take 3 mLs (2.5 mg total) by nebulization every 4 (four) hours as needed for wheezing or shortness of breath., Disp: 75 mL, Rfl: 1   albuterol (VENTOLIN HFA) 108 (90 Base) MCG/ACT inhaler, Inhale 2 puffs into the lungs every 4 (four) hours as needed., Disp: 18 g, Rfl: 1   amLODipine (NORVASC) 10 MG tablet, Take 10 mg by mouth every morning., Disp: , Rfl:    aspirin-acetaminophen-caffeine (EXCEDRIN MIGRAINE) 250-250-65 MG tablet, Take 2 tablets by mouth every 6 (six) hours as needed for headache., Disp: , Rfl:    budesonide (PULMICORT) 0.5 MG/2ML nebulizer solution, WITH RESPIRATORY ILLNESS OR FLARE UPS, START 0.5MG  NEBULIZED TWICE DAILY FOR 1-2 WEEKS., Disp: 540 mL, Rfl: 1   budesonide-formoterol (SYMBICORT) 160-4.5 MCG/ACT inhaler, Inhale 2 puffs into the lungs 2 (two) times daily., Disp: 30.6 g, Rfl: 1   butalbital-acetaminophen-caffeine (FIORICET) 50-325-40 MG tablet, 1 tablet as needed Orally every 4 hrs for  4 days (Patient not taking: Reported on 12/31/2022), Disp: , Rfl:    cetirizine (ZYRTEC) 10 MG tablet, Take 1 tablet (10 mg total) by mouth daily., Disp: 90 tablet, Rfl: 1   Cholecalciferol (VITAMIN D) 50 MCG (2000 UT) tablet, Take 2,000 Units by mouth daily., Disp: , Rfl:    diazepam (VALIUM) 5 MG tablet, Take one tablet by mouth with food one hour prior to procedure. May repeat 30 minutes prior if needed., Disp: 2 tablet, Rfl: 0   diphenhydrAMINE (BENADRYL) 25 MG tablet, Take  25 mg by mouth daily as needed for allergies., Disp: , Rfl:    EPINEPHrine 0.3 mg/0.3 mL IJ SOAJ injection, Inject 0.3 mg into the muscle as directed., Disp: , Rfl:    gabapentin (NEURONTIN) 100 MG capsule, Take 1 capsule (100 mg total) by mouth at bedtime. (Patient not taking: Reported on 12/31/2022), Disp: 30 capsule, Rfl: 0   HYDROcodone-acetaminophen (NORCO) 7.5-325 MG tablet, Take 1 tablet by mouth every 6 (six) hours as needed for moderate pain., Disp: 30 tablet, Rfl: 0   ibuprofen (ADVIL) 200 MG tablet, Take 600 mg by mouth every 6 (six) hours as needed for headache or moderate pain., Disp: , Rfl:    ipratropium (ATROVENT) 0.06 % nasal spray, Apply 2 sprays each nostril up to 3-4 times a day as needed, Disp: 45 mL, Rfl: 1   Lancets (ONETOUCH DELICA PLUS LANCET33G) MISC, Apply 1 each topically daily., Disp: , Rfl:    levonorgestrel (MIRENA) 20 MCG/24HR IUD, 1 each by Intrauterine route once., Disp: , Rfl:    lidocaine (LIDODERM) 5 %, Place 1 patch onto the skin daily as needed (pain). Remove & Discard patch within 12 hours or as directed by MD, Disp: 30 patch, Rfl: 3   losartan (COZAAR) 25 MG tablet, Take 25 mg by mouth daily., Disp: , Rfl: 0   metFORMIN (GLUCOPHAGE-XR) 750 MG 24 hr tablet, Take 750 mg by mouth 2 (two) times daily., Disp: , Rfl:    montelukast (SINGULAIR) 10 MG tablet, Take 1 tablet (10 mg total) by mouth at bedtime., Disp: 90 tablet, Rfl: 1   MOUNJARO 7.5 MG/0.5ML Pen, Inject 7.5 mg into the skin once a  week., Disp: , Rfl:    nystatin cream (MYCOSTATIN), APPLY EXTERNALLY TWICE A DAY AS NEEDED 30, Disp: , Rfl:    Olopatadine HCl (PATADAY) 0.2 % SOLN, Place 1 drop into both eyes daily as needed., Disp: 7.5 mL, Rfl: 1   omeprazole (PRILOSEC) 40 MG capsule, Take 1 capsule by mouth daily., Disp: , Rfl:    ondansetron (ZOFRAN) 8 MG tablet, Take 8 mg by mouth every 8 (eight) hours as needed for nausea or vomiting., Disp: , Rfl:    ONETOUCH VERIO test strip, as directed. , Disp: , Rfl: 3   oxybutynin (DITROPAN-XL) 10 MG 24 hr tablet, Take 10 mg by mouth daily., Disp: , Rfl:    rosuvastatin (CRESTOR) 10 MG tablet, Take 10 mg by mouth daily., Disp: , Rfl: 3   Spacer/Aero-Holding Chambers DEVI, Take 1 each by mouth as directed., Disp: 1 each, Rfl: 0   tiZANidine (ZANAFLEX) 4 MG tablet, TAKE 1 TABLET BY MOUTH EVERY 6 HOURS AS NEEDED FOR MUSCLE SPASMS., Disp: 360 tablet, Rfl: 1   traMADol (ULTRAM) 50 MG tablet, Take 1 tablet (50 mg total) by mouth every 8 (eight) hours as needed for moderate pain (pain score 4-6) or severe pain (pain score 7-10)., Disp: 20 tablet, Rfl: 0   zonisamide (ZONEGRAN) 25 MG capsule, Take 50 mg by mouth 2 (two) times daily., Disp: , Rfl:   Vitals   Vitals:   03/13/23 0154 03/13/23 0240  BP: (!) 169/96   Pulse: 94   Resp: 16   Temp: 97.9 F (36.6 C)   TempSrc: Oral   SpO2: 100%   Weight:  117 kg  Height:  5\' 5"  (1.651 m)    Body mass index is 42.92 kg/m.  Physical Exam   Constitutional: Appears well-developed and well-nourished.  Psych: Affect appropriate to situation.  Eyes: No scleral  injection.  HENT: No OP obstruction.  Head: Normocephalic.  Cardiovascular: Normal rate and regular rhythm.  Respiratory: Effort normal, non-labored breathing.  GI: Soft.  No distension. There is no tenderness.  Skin: WDI.   Neurologic Examination   Alert oriented x 3.  Speech is mildly dysarthric.  No aphasia. Cranial nerves II to XII intact Motor examination with no drift  in any of the 4 extremities Sensation intact to light touch Coordination intact  Labs/Imaging/Neurodiagnostic studies   CBC:  Recent Labs  Lab 2023-04-09 0201 04/09/23 0209  WBC  --  8.8  NEUTROABS  --  4.5  HGB 14.6 13.4  HCT 43.0 42.2  MCV  --  85.1  PLT  --  278   Basic Metabolic Panel:  Lab Results  Component Value Date   NA 137 04-09-23   K 3.8 2023/04/09   CO2 22 April 09, 2023   GLUCOSE 126 (H) 09-Apr-2023   BUN 7 04-09-23   CREATININE 0.82 04/09/23   CALCIUM 9.0 2023-04-09   GFRNONAA >60 April 09, 2023   GFRAA >60 01/04/2018   HgbA1c:  Lab Results  Component Value Date   HGBA1C 6.5 (H) 03/03/2017   Urine Drug Screen:     Component Value Date/Time   LABOPIA POSITIVE (A) 07/19/2022 0510   COCAINSCRNUR NONE DETECTED 07/19/2022 0510   LABBENZ NONE DETECTED 07/19/2022 0510   AMPHETMU NONE DETECTED 07/19/2022 0510   THCU NONE DETECTED 07/19/2022 0510   LABBARB NONE DETECTED 07/19/2022 0510    Alcohol Level     Component Value Date/Time   ETH <10 04/09/2023 0209   CT Head without contrast(Personally reviewed): Aspects 10, no bleed   MRI Brain(Personally reviewed): No acute changes.  No acute or subacute infarct  ASSESSMENT   Emily Hester is a 52 y.o. female  has a past medical history of Arthritis, Asthma, Diabetes mellitus without complication (HCC), GERD (gastroesophageal reflux disease), Hypertension, Interstitial cystitis, IUD (intrauterine device) in place (placed 6 weeks ago), Migraines, and Pneumonia.  Presents as a code stroke for evaluation of slurred speech and a headache.  Initial stroke workup including CT head within normal limits. Going by her history and her reassuring exam, this is likely secondary to complex migraine but given her risk factors, did not want to miss an acute stroke.  Taken for stat MRI of the brain which was unremarkable.  Impression: Likely complex migraine  RECOMMENDATIONS  Migraine  cocktail-Toradol/Reglan/Benadryl IV, as well as IV fluids Reassess after medications.  If better, can be discharged home with outpatient neurology follow-up with her outpatient neurologist. If continues to have headache after the initial migraine cocktail, can repeat once and hopefully that should resolve her symptoms. Plan discussed with Dr. Haywood Filler Inpatient neurology will be available as needed  ______________________________________________________________________    Signed, Milon Dikes, MD [facility]

## 2023-03-13 NOTE — ED Provider Notes (Signed)
[facility] Emergency Department Provider Note MRN:  [id number]  Arrival date & time: 03/13/23     Chief Complaint   Code stroke History of Present Illness   Emily Hester is a 52 y.o. year-old female with a history of diabetes, migraines presenting to the ED with chief complaint of code stroke.  Frequent headaches, some alcohol this evening, developed slurred speech, code stroke initiated prior to arrival.  History of complex migraines.  Review of Systems  A thorough review of systems was obtained and all systems are negative except as noted in the HPI and PMH.   Patient's Health History    Past Medical History:  Diagnosis Date   Arthritis    patient denies   Asthma    Diabetes mellitus without complication (HCC)    type    GERD (gastroesophageal reflux disease)    Hypertension    Interstitial cystitis    IUD (intrauterine device) in place placed 6 weeks ago   Migraines    MIgraines- not current     Pneumonia     Past Surgical History:  Procedure Laterality Date   ANTERIOR HIP REVISION Right 05/20/2020   Procedure: RIGHT TOTAL HIP REVISION- ploy exhange and hip ball exchange with bone graft;  Surgeon: Kathryne Hitch, MD;  Location: [facility] OR;  Service: Orthopedics;  Laterality: Right;   DILATION AND EVACUATION  09/30/2011   Procedure: DILATATION AND EVACUATION;  Surgeon: Shanece Andrea, MD;  Location: [facility];  Service: Gynecology;  Laterality: N/A;   left knee surgery      arthroscopy    TOTAL HIP ARTHROPLASTY Right 03/11/2017   Procedure: RIGHT TOTAL HIP ARTHROPLASTY ANTERIOR APPROACH;  Surgeon: Kathryne Hitch, MD;  Location: [facility];  Service: Orthopedics;  Laterality: Right;    Family History  Problem Relation Age of Onset   Diabetes Mother    Heart disease Mother    Diabetes Father    Eczema Sister    Asthma Brother    Eczema Daughter    Migraines Daughter    Migraines Daughter    Asthma Son    Allergic  rhinitis Neg Hx    Angioedema Neg Hx    Immunodeficiency Neg Hx    Urticaria Neg Hx    Headache Neg Hx    Sleep apnea Neg Hx     Social History   Socioeconomic History   Marital status: Married    Spouse name: Not on file   Number of children: Not on file   Years of education: Not on file   Highest education level: Not on file  Occupational History   Not on file  Tobacco Use   Smoking status: Never    Passive exposure: Current   Smokeless tobacco: Never  Vaping Use   Vaping status: Never Used  Substance and Sexual Activity   Alcohol use: No   Drug use: No   Sexual activity: Not on file  Other Topics Concern   Not on file  Social History Narrative   Not on file   Social Drivers of Health   Financial Resource Strain: Not on file  Food Insecurity: Not on file  Transportation Needs: Not on file  Physical Activity: Not on file  Stress: Not on file  Social Connections: Not on file  Intimate Partner Violence: Not on file     Physical Exam   Vitals:   03/13/23 0154 03/13/23 0245  BP: (!) 169/96 125/80  Pulse: 94 92  Resp: 16 (!) 21  Temp: 97.9 F (36.6 C)   SpO2: 100% 99%    CONSTITUTIONAL: Well-appearing, NAD NEURO/PSYCH:  Alert and oriented x 3, slurred speech, normal and symmetric strength and sensation, normal coordination EYES:  eyes equal and reactive ENT/NECK:  no LAD, no JVD CARDIO: Regular rate, well-perfused, normal S1 and S2 PULM:  CTAB no wheezing or rhonchi GI/GU:  non-distended, non-tender MSK/SPINE:  No gross deformities, no edema SKIN:  no rash, atraumatic   *Additional and/or pertinent findings included in MDM below  Diagnostic and Interventional Summary    EKG Interpretation Date/Time:  Sunday March 13 2023 02:43:12 EST Ventricular Rate:  95 PR Interval:  159 QRS Duration:  84 QT Interval:  364 QTC Calculation: 458 R Axis:   100  Text Interpretation: Sinus rhythm Low voltage with right axis deviation Confirmed by Kennis Carina  [id number]) on 03/13/2023 3:16:19 AM       Labs Reviewed  CBC - Abnormal; Notable for the following components:      Result Value   RDW 16.3 (*)    All other components within normal limits  COMPREHENSIVE METABOLIC PANEL - Abnormal; Notable for the following components:   Glucose, Bld 126 (*)    All other components within normal limits  I-STAT CHEM 8, ED - Abnormal; Notable for the following components:   Glucose, Bld 127 (*)    Calcium, Ion 1.14 (*)    All other components within normal limits  CBG MONITORING, ED - Abnormal; Notable for the following components:   Glucose-Capillary 129 (*)    All other components within normal limits  PROTIME-INR  APTT  DIFFERENTIAL  ETHANOL  HCG, SERUM, QUALITATIVE  RAPID URINE DRUG SCREEN, HOSP PERFORMED    MR BRAIN WO CONTRAST  Final Result    CT HEAD CODE STROKE WO CONTRAST  Final Result      Medications  sodium chloride flush (NS) 0.9 % injection 3 mL (3 mLs Intravenous Given 03/13/23 0348)  ketorolac (TORADOL) 15 MG/ML injection 15 mg (15 mg Intravenous Given 03/13/23 0358)  diphenhydrAMINE (BENADRYL) injection 25 mg (25 mg Intravenous Given 03/13/23 0354)  prochlorperazine (COMPAZINE) injection 10 mg (10 mg Intravenous Given 03/13/23 0349)  sodium chloride 0.9 % bolus 1,000 mL (0 mLs Intravenous Stopped 03/13/23 0550)     Procedures  /  Critical Care Procedures  ED Course and Medical Decision Making  Initial Impression and Ddx Code stroke with slurred speech onset a little over an hour prior to arrival.  Evaluated by neurology, favored to be related to complex migraine or alcohol rather than acute ischemic stroke.  Plan is for MRI.  Past medical/surgical history that increases complexity of ED encounter: History of complex migraines  Interpretation of Diagnostics I personally reviewed the EKG and my interpretation is as follows: Sinus rhythm  No blood count or electrolyte disturbance  Patient Reassessment and Ultimate  Disposition/Management     CT and MRI imaging are reassuring.  After migraine cocktail patient's symptoms are resolved, appropriate for discharge with follow-up per neurology.  Patient management required discussion with the following services or consulting groups:  Neurology  Complexity of Problems Addressed Acute illness or injury that poses threat of life of bodily function  Additional Data Reviewed and Analyzed Further history obtained from: Further history from spouse/family member  Additional Factors Impacting ED Encounter Risk Consideration of hospitalization  Elmer Sow. Pilar Plate, MD [facility] Emergency Medicine [facility] [email or web address]  Final Clinical Impressions(s) / ED Diagnoses  ICD-10-CM   1. Slurred speech  R47.81       ED Discharge Orders     None        Discharge Instructions Discussed with and Provided to Patient:     Discharge Instructions      You were evaluated in the Emergency Department and after careful evaluation, we did not find any emergent condition requiring admission or further testing in the hospital.  Your exam/testing today is overall reassuring.  Recommend follow-up with your neurologist to discuss her symptoms.  Please return to the Emergency Department if you experience any worsening of your condition.   Thank you for allowing [name] to be a part of your care.       Sabas Sous, MD 03/13/23 [phone]

## 2023-03-13 NOTE — Code Documentation (Signed)
Stroke Response Nurse Documentation Code Documentation  Emily Hester is a 52 y.o. female arriving to [facility]  via [facility] on 12/29 with past medical hx of migraines, obesity and HTN. On No antithrombotic. Code stroke was activated by EMS.   Patient from home where she was LKW at [phone] and now complaining of headache and slurred speech.   Stroke team at the bedside on patient arrival. Labs drawn and patient cleared for CT by Dr. Pilar Plate. Patient to CT with team. NIHSS 1, see documentation for details and code stroke times. Patient with dysarthria  on exam. The following imaging was completed:  CT Head and MRI. Patient is not a candidate for IV Thrombolytic due to too mild to treat. Patient is not a candidate for IR due to low suspicion for stroke.  .   Bedside handoff with ED RN .    Rose Fillers  Rapid Response RN

## 2023-04-11 ENCOUNTER — Other Ambulatory Visit: Payer: Self-pay | Admitting: Radiology

## 2023-04-11 ENCOUNTER — Ambulatory Visit (INDEPENDENT_AMBULATORY_CARE_PROVIDER_SITE_OTHER): Payer: Managed Care, Other (non HMO) | Admitting: Physician Assistant

## 2023-04-11 DIAGNOSIS — M7062 Trochanteric bursitis, left hip: Secondary | ICD-10-CM

## 2023-04-11 DIAGNOSIS — M7061 Trochanteric bursitis, right hip: Secondary | ICD-10-CM

## 2023-04-11 DIAGNOSIS — M7542 Impingement syndrome of left shoulder: Secondary | ICD-10-CM | POA: Diagnosis not present

## 2023-04-11 MED ORDER — METHYLPREDNISOLONE ACETATE 40 MG/ML IJ SUSP
40.0000 mg | INTRAMUSCULAR | Status: AC | PRN
  Administered 2023-04-11: 40 mg via INTRA_ARTICULAR

## 2023-04-11 MED ORDER — LIDOCAINE HCL 1 % IJ SOLN
3.0000 mL | INTRAMUSCULAR | Status: AC | PRN
  Administered 2023-04-11: 3 mL

## 2023-04-11 NOTE — Progress Notes (Signed)
HPI: [name] comes in today requesting cortisone injection for her left shoulder and also for her left hip.  She last had a left hip injection 04/06/2022.  States that the pain is lateral aspect the hip comes and goes.  Unable to lay on the left hip.  She is recently underwent lumbar ablation with Dr. Alvester Morin needs been beneficial.  Having no radicular symptoms down the left leg.  Left shoulder impingement.  She has undergone therapy for her left shoulder but has not really been doing exercises.  No numbness tingling down the arm.  States prior injections have been helpful.  She is requesting another injection.  Reports her diabetes is under good control with a hemoglobin A1c of approximately 5.2  He is systems: Denies any fevers or chills.   Physical exam: General: Well-developed well-nourished female no acute distress walks with a slight antalgic gait. Psych: Alert and oriented x 3 Bilateral hips: Good range of motion both hips.  Leg lengths are equal. Bilateral shoulders 5 out of 5 strength external and internal rotation against resistance empty can test is negative bilaterally.  Liftoff test negative bilaterally.  Forward flexion actively 160 prior to injection after active injection 180 degrees left shoulder.  Full overhead activity of the right shoulder without pain.  Impression: Left shoulder impingement Right trochanteric bursitis   Plan: Per her request she is given a left shoulder subacromial injection and a left hip trochanteric injection.  She is also asking about her gait and balance which she feels is off I reassured her that the leg lengths are equal.  Recommended gait balance training with therapy will have her undergo therapy for gait and balance training also IT band stretching.  In regards to her shoulder I did review shoulder exercises and encouraged her to get back to doing her shoulder exercises as she was taught by therapy in the past.  She will follow-up with [name] as needed  she knows to watch her glucose levels closely over the next 24 to 48 hours.      Procedure Note  [name]             Date of Birth: 25-Aug-1970           MRN: [id number]             Visit Date: 04/11/2023  Procedures: Visit Diagnoses:  1. Impingement syndrome of left shoulder   2. Trochanteric bursitis, left hip     Large Joint Inj: L subacromial bursa on 04/11/2023 12:01 PM Indications: pain Details: 22 G 1.5 in needle, superior approach  Arthrogram: No  Medications: 3 mL lidocaine 1 %; 40 mg methylPREDNISolone acetate 40 MG/ML Outcome: tolerated well, no immediate complications Procedure, treatment alternatives, risks and benefits explained, specific risks discussed. Consent was given by the patient. Immediately prior to procedure a time out was called to verify the correct patient, procedure, equipment, support staff and site/side marked as required. Patient was prepped and draped in the usual sterile fashion.    Large Joint Inj: L greater trochanter on 04/11/2023 12:01 PM Indications: pain Details: 22 G 1.5 in needle, lateral approach  Arthrogram: No  Medications: 3 mL lidocaine 1 %; 40 mg methylPREDNISolone acetate 40 MG/ML Outcome: tolerated well, no immediate complications Procedure, treatment alternatives, risks and benefits explained, specific risks discussed. Consent was given by the patient. Immediately prior to procedure a time out was called to verify the correct patient, procedure, equipment, support staff and site/side marked as required.  Patient was prepped and draped in the usual sterile fashion.

## 2023-04-19 ENCOUNTER — Ambulatory Visit: Payer: Managed Care, Other (non HMO) | Admitting: Rehabilitative and Restorative Service Providers"

## 2023-04-26 ENCOUNTER — Other Ambulatory Visit: Payer: Self-pay

## 2023-04-26 DIAGNOSIS — M7542 Impingement syndrome of left shoulder: Secondary | ICD-10-CM

## 2023-05-06 ENCOUNTER — Ambulatory Visit
Admission: EM | Admit: 2023-05-06 | Discharge: 2023-05-06 | Disposition: A | Discharge status: Home / Self Care | Payer: Managed Care, Other (non HMO) | Attending: Physician Assistant | Admitting: Physician Assistant

## 2023-05-06 ENCOUNTER — Encounter: Payer: Self-pay | Admitting: *Deleted

## 2023-05-06 ENCOUNTER — Other Ambulatory Visit: Payer: Self-pay

## 2023-05-06 DIAGNOSIS — J029 Acute pharyngitis, unspecified: Secondary | ICD-10-CM | POA: Diagnosis present

## 2023-05-06 DIAGNOSIS — J069 Acute upper respiratory infection, unspecified: Secondary | ICD-10-CM | POA: Diagnosis present

## 2023-05-06 DIAGNOSIS — J4541 Moderate persistent asthma with (acute) exacerbation: Secondary | ICD-10-CM | POA: Insufficient documentation

## 2023-05-06 LAB — POCT RAPID STREP A (OFFICE): Rapid Strep A Screen: NEGATIVE

## 2023-05-06 MED ORDER — PROMETHAZINE-DM 6.25-15 MG/5ML PO SYRP: 5.0000 mL | ORAL_SOLUTION | Freq: Three times a day (TID) | ORAL | 0 refills | Status: DC | PRN

## 2023-05-06 MED ORDER — IPRATROPIUM-ALBUTEROL 0.5-2.5 (3) MG/3ML IN SOLN
3.0000 mL | Freq: Once | RESPIRATORY_TRACT | Status: AC
  Administered 2023-05-06: 3 mL via RESPIRATORY_TRACT

## 2023-05-06 MED ORDER — BUDESONIDE-FORMOTEROL FUMARATE 160-4.5 MCG/ACT IN AERO: 2.0000 | INHALATION_SPRAY | Freq: Two times a day (BID) | RESPIRATORY_TRACT | 1 refills | Status: DC

## 2023-05-06 MED ORDER — ALBUTEROL SULFATE (2.5 MG/3ML) 0.083% IN NEBU: 2.5000 mg | INHALATION_SOLUTION | Freq: Four times a day (QID) | RESPIRATORY_TRACT | 0 refills | Status: DC | PRN

## 2023-05-06 NOTE — ED Triage Notes (Signed)
 Headache, cough, chills, body aches, congestion, sore throat, nausea since Tuesday night. Family member has the flu. No meds taken this am

## 2023-05-06 NOTE — ED Provider Notes (Signed)
 [facility]    CSN: [id number] Arrival date & time: 05/06/23  0802      History   Chief Complaint Chief Complaint  Patient presents with   Cough    HPI KYSHA MURALLES is a 53 y.o. female.   Patient presents today with a 3-day history of URI symptoms including cough, shortness of breath, chills, body aches, congestion, nausea.  Denies any vomiting, diarrhea, chest pain.  She has been taking DayQuil and NyQuil with temporary improvement of symptoms.  She reports household sick contacts that have tested positive for influenza A.  She does have a history of asthma and has been using her albuterol inhaler regularly with temporary improvement of symptoms.  Denies hospitalization related to asthma.  Denies any recent antibiotics or steroids.  She did have influenza vaccine as well as COVID vaccinations.  She has had COVID with last episode several years ago.  She does have a history of diabetes but reports her blood sugars have been well-controlled.  She has no concern for pregnancy.    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   Status post revision of total hip 05/20/2020   Trochanteric bursitis, right hip 09/05/2019   Hyperlipidemia 08/31/2019   Diabetes mellitus (HCC) 08/31/2019   Arthritis 08/31/2019   Overactive bladder 08/31/2019   Hypertensive disorder 08/30/2018   Asthma 08/30/2018   Migraine 08/30/2018   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/11/2017   Unilateral primary osteoarthritis, right hip 01/27/2017   IUD (intrauterine device) in place 01/13/2017    Past Surgical History:  Procedure Laterality Date   ANTERIOR HIP REVISION Right 05/20/2020   Procedure: RIGHT TOTAL HIP REVISION- ploy exhange and hip ball exchange with bone graft;  Surgeon: Kathryne Hitch, MD;  Location: [facility] OR;  Service: Orthopedics;  Laterality: Right;   DILATION AND EVACUATION  09/30/2011   Procedure: DILATATION AND EVACUATION;  Surgeon: Ura Andrea, MD;  Location: [facility];  Service: Gynecology;  Laterality: N/A;   left knee surgery      arthroscopy    TOTAL HIP ARTHROPLASTY Right 03/11/2017   Procedure: RIGHT TOTAL HIP ARTHROPLASTY ANTERIOR APPROACH;  Surgeon: Kathryne Hitch, MD;  Location: [facility];  Service: Orthopedics;  Laterality: Right;    OB History     Gravida  1   Para      Term      Preterm      AB      Living         SAB      IAB      Ectopic      Multiple      Live Births               Home Medications    Prior to Admission medications   Medication Sig Start Date End Date Taking? Authorizing Provider  acetaminophen (TYLENOL) 500 MG tablet Take 1,000 mg by mouth every 6 (six) hours as needed for moderate pain.   Yes [provider]  albuterol (PROVENTIL) (2.5 MG/3ML) 0.083% nebulizer solution Take 3 mLs (2.5 mg total) by nebulization every 6 (six) hours as needed for wheezing or shortness of breath. 05/06/23  Yes Trever Streater K, PA-C  albuterol (VENTOLIN HFA) 108 (90 Base) MCG/ACT inhaler Inhale 2 puffs into the lungs every 4 (four) hours as needed. 12/31/22  Yes Padgett, Pilar Grammes, MD  amLODipine (NORVASC) 10 MG tablet Take 10 mg by mouth every morning.   Yes [provider]  aspirin-acetaminophen-caffeine (EXCEDRIN MIGRAINE) [phone] MG tablet Take 2 tablets by mouth every 6 (six) hours as needed for headache.   Yes [provider]  budesonide-formoterol  (SYMBICORT) 160-4.5 MCG/ACT inhaler Inhale 2 puffs into the lungs in the morning and at bedtime. 05/06/23  Yes Letonya Mangels K, PA-C  cetirizine (ZYRTEC) 10 MG tablet Take 1 tablet (10 mg total) by mouth daily. 12/31/22  Yes Padgett, Pilar Grammes, MD  Cholecalciferol (VITAMIN D) 50 MCG (2000 UT) tablet Take 2,000 Units by mouth daily.   Yes [provider]  diphenhydrAMINE (BENADRYL) 25 MG tablet Take 25 mg by mouth daily as needed for allergies.   Yes [provider]  EPINEPHrine 0.3 mg/0.3 mL IJ SOAJ injection Inject 0.3 mg into the muscle as directed.   Yes [provider]  HYDROcodone-acetaminophen (NORCO) 7.5-325 MG tablet Take 1 tablet by mouth every 6 (six) hours as needed for moderate pain. 11/01/22  Yes Kirtland Bouchard, PA-C  ibuprofen (ADVIL) 200 MG tablet Take 600 mg by mouth every 6 (six) hours as needed for headache or moderate pain.   Yes [provider]  ipratropium (ATROVENT) 0.06 % nasal spray Apply 2 sprays each nostril up to 3-4 times a day as needed 12/31/22  Yes Padgett, Pilar Grammes, MD  levonorgestrel (MIRENA) 20 MCG/24HR IUD 1 each by Intrauterine route once.   Yes [provider]  lidocaine (LIDODERM) 5 % Place 1 patch onto the skin daily as needed (pain). Remove & Discard patch within 12 hours or as directed by MD 01/18/22  Yes Kirtland Bouchard, PA-C  losartan (COZAAR) 25 MG tablet Take 25 mg by mouth daily. 10/20/16  Yes [provider]  metFORMIN (GLUCOPHAGE-XR) 750 MG 24 hr tablet Take 750 mg by mouth 2 (two) times daily. 03/21/18  Yes [provider]  montelukast (SINGULAIR) 10 MG tablet Take 1 tablet (10 mg total) by mouth at bedtime. 12/31/22  Yes Padgett, Pilar Grammes, MD  MOUNJARO 7.5 MG/0.5ML Pen Inject 7.5 mg into the skin once a week.   Yes [provider]  nystatin cream (MYCOSTATIN) APPLY EXTERNALLY TWICE A DAY AS NEEDED 30   Yes [provider]  omeprazole (PRILOSEC) 40 MG  capsule Take 1 capsule by mouth daily.   Yes [provider]  ondansetron (ZOFRAN) 8 MG tablet Take 8 mg by mouth every 8 (eight) hours as needed for nausea or vomiting.   Yes [provider]  oxybutynin (DITROPAN-XL) 10 MG 24 hr tablet Take 10 mg by mouth daily. 12/10/22  Yes [provider]  promethazine-dextromethorphan (PROMETHAZINE-DM) 6.25-15 MG/5ML syrup Take 5 mLs by mouth 3 (three) times daily as needed for cough. 05/06/23  Yes Seith Aikey K, PA-C  rosuvastatin (CRESTOR) 10 MG tablet Take 10 mg by mouth daily. 10/20/16  Yes [provider]  tiZANidine (ZANAFLEX) 4 MG tablet TAKE 1  TABLET BY MOUTH EVERY 6 HOURS AS NEEDED FOR MUSCLE SPASMS. 08/25/21  Yes Kathryne Hitch, MD  traMADol (ULTRAM) 50 MG tablet Take 1 tablet (50 mg total) by mouth every 8 (eight) hours as needed for moderate pain (pain score 4-6) or severe pain (pain score 7-10). 01/12/23  Yes Juanda Chance, NP  zonisamide (ZONEGRAN) 25 MG capsule Take 50 mg by mouth 2 (two) times daily. 10/06/22  Yes [provider]  butalbital-acetaminophen-caffeine (FIORICET) 50-325-40 MG tablet 1 tablet as needed Orally every 4 hrs for 4 days Patient not taking: Reported on 12/31/2022 07/23/22   [provider]  diazepam (VALIUM) 5 MG tablet Take one tablet by mouth with food one hour prior to procedure. May repeat 30 minutes prior if needed. Patient not taking: Reported on 05/06/2023 01/12/23   Juanda Chance, NP  gabapentin (NEURONTIN) 100 MG capsule Take 1 capsule (100 mg total) by mouth at bedtime. Patient not taking: Reported on 12/31/2022 10/16/20   Kirtland Bouchard, PA-C  Lancets [facility] DELICA PLUS [place]) MISC Apply 1 each topically daily. 04/19/22   [provider]  Olopatadine HCl (PATADAY) 0.2 % SOLN Place 1 drop into both eyes daily as needed. Patient not taking: Reported on 05/06/2023 12/31/22   Marcelyn Bruins, MD  [facility] VERIO test strip as  directed.  09/01/17   [provider]  Spacer/Aero-Holding Deretha [facility] DEVI Take 1 each by mouth as directed. 10/06/20   Nehemiah Settle, FNP    Family History Family History  Problem Relation Age of Onset   Diabetes Mother    Heart disease Mother    Diabetes Father    Eczema Sister    Asthma Brother    Eczema Daughter    Migraines Daughter    Migraines Daughter    Asthma Son    Allergic rhinitis Neg Hx    Angioedema Neg Hx    Immunodeficiency Neg Hx    Urticaria Neg Hx    Headache Neg Hx    Sleep apnea Neg Hx     Social History Social History   Tobacco Use   Smoking status: Never    Passive exposure: Current   Smokeless tobacco: Never  Vaping Use   Vaping status: Never Used  Substance Use Topics   Alcohol use: No   Drug use: No     Allergies   Amoxicillin, Lisinopril, Olmesartan, and Bactrim   Review of Systems Review of Systems  Constitutional:  Positive for activity change, chills and fatigue. Negative for appetite change and fever.  HENT:  Positive for congestion and sore throat. Negative for sinus pressure and sneezing.   Respiratory:  Positive for cough and shortness of breath.   Cardiovascular:  Negative for chest pain.  Gastrointestinal:  Positive for nausea. Negative for abdominal pain, diarrhea and vomiting.  Musculoskeletal:  Positive for arthralgias and myalgias.  Neurological:  Negative for headaches.     Physical Exam Triage Vital Signs ED Triage Vitals  Encounter Vitals Group     BP 05/06/23 0819 109/79     Systolic BP Percentile --      Diastolic BP Percentile --      Pulse Rate 05/06/23 0819 (!) 109     Resp 05/06/23 0819 18     Temp 05/06/23 0819 98.5 F (36.9 C)     Temp Source 05/06/23 0819 Oral     SpO2 05/06/23 0819 96 %     Weight --      Height --  Head Circumference --      Peak Flow --      Pain Score 05/06/23 0815 8     Pain Loc --      Pain Education --      Exclude from Growth Chart --    No data  found.  Updated Vital Signs BP 109/79 (BP Location: Left Arm)   Pulse (!) 109   Temp 98.5 F (36.9 C) (Oral)   Resp 18   SpO2 96%   Visual Acuity Right Eye Distance:   Left Eye Distance:   Bilateral Distance:    Right Eye Near:   Left Eye Near:    Bilateral Near:     Physical Exam Vitals reviewed.  Constitutional:      General: She is awake. She is not in acute distress.    Appearance: Normal appearance. She is well-developed. She is not ill-appearing.     Comments: Very pleasant female appears stated age in no acute distress sitting comfortably in exam room  HENT:     Head: Normocephalic and atraumatic.     Right Ear: Tympanic membrane, ear canal and external ear normal. Tympanic membrane is not erythematous or bulging.     Left Ear: Tympanic membrane, ear canal and external ear normal. Tympanic membrane is not erythematous or bulging.     Nose:     Right Sinus: No maxillary sinus tenderness or frontal sinus tenderness.     Left Sinus: No maxillary sinus tenderness or frontal sinus tenderness.     Mouth/Throat:     Pharynx: Uvula midline. Postnasal drip present. No oropharyngeal exudate or posterior oropharyngeal erythema.  Cardiovascular:     Rate and Rhythm: Normal rate and regular rhythm.     Heart sounds: Normal heart sounds, S1 normal and S2 normal. No murmur heard. Pulmonary:     Effort: Pulmonary effort is normal.     Breath sounds: Wheezing present. No rhonchi or rales.     Comments: Widespread wheezing Psychiatric:        Behavior: Behavior is cooperative.      UC Treatments / Results  Labs (all labs ordered are listed, but only abnormal results are displayed) Labs Reviewed  POCT RAPID STREP A (OFFICE) - Normal  CULTURE, GROUP A STREP [facility])    EKG   Radiology No results found.  Procedures Procedures (including critical care time)  Medications Ordered in UC Medications  ipratropium-albuterol (DUONEB) 0.5-2.5 (3) MG/3ML nebulizer solution 3  mL (3 mLs Nebulization Given 05/06/23 0843)    Initial Impression / Assessment and Plan / UC Course  I have reviewed the triage vital signs and the nursing notes.  Pertinent labs & imaging results that were available during my care of the patient were reviewed by me and considered in my medical decision making (see chart for details).     Patient is mildly tachycardic but otherwise well-appearing, afebrile, nontoxic.  Unfortunately, we do not have any COVID/flu test in clinic today but we discussed that given her exposure to influenza this is the likely cause of her symptoms and she is outside the window where we would start Tamiflu.  I believe that her symptoms triggered her asthma and so we will treat for an asthma exacerbation.  She was given a DuoNeb in clinic with some improvement of symptoms and sent home with albuterol to put into her nebulizer and use on a scheduled basis for the next several days and then decrease use to as needed thereafter.  Chest  x-ray was deferred as her oxygen saturation was appropriate in the wheezing resolved following DuoNeb in clinic.  She was also restarted on Symbicort which she had taken in the past.  We discussed that she is to rinse her mouth following use of this medication to prevent thrush.  Will defer systemic steroids given her history of diabetes but we discussed that this is something we would need to reconsider if her symptoms are not improving with current treatment plan.  Her daughter tested positive for strep and so she did require strep testing which was negative in clinic.  Will send this for culture but defer antibiotics until culture results are available.  She was given Promethazine DM for cough and we discussed that this can be sedating.  She is not to drive or drink alcohol with taking it.  Discussed that if symptoms are not improving within a few days or if anything worsens she needs to be seen immediately.  Strict return precautions given.  Final  Clinical Impressions(s) / UC Diagnoses   Final diagnoses:  Moderate persistent asthma with acute exacerbation  Viral URI  Sore throat     Discharge Instructions      As we discussed, I think you have a virus which could be the flu causing your asthma to be flared.  Your strep testing was negative.  We will contact you if we need to arrange antibiotic treatment based on your culture results.  Use your albuterol every 4-6 hours as needed for shortness of breath and coughing fits.  Start Symbicort twice daily.  Make sure to rinse your mouth following use of this medication to prevent thrush.  Use over-the-counter medication such as Tylenol, Flonase, nasal saline/sinus rinses.  Take Promethazine DM for cough.  This will make you sleepy so do not drive drink alcohol taking it.     ED Prescriptions     Medication Sig Dispense Auth. Provider   budesonide-formoterol (SYMBICORT) 160-4.5 MCG/ACT inhaler Inhale 2 puffs into the lungs in the morning and at bedtime. 1 each Aadin Gaut K, PA-C   albuterol (PROVENTIL) (2.5 MG/3ML) 0.083% nebulizer solution Take 3 mLs (2.5 mg total) by nebulization every 6 (six) hours as needed for wheezing or shortness of breath. 75 mL Shawna Kiener K, PA-C   promethazine-dextromethorphan (PROMETHAZINE-DM) 6.25-15 MG/5ML syrup Take 5 mLs by mouth 3 (three) times daily as needed for cough. 118 mL Dawnielle Christiana K, PA-C      PDMP not reviewed this encounter.   Jeani Hawking, PA-C 05/06/23 1610

## 2023-05-06 NOTE — Discharge Instructions (Addendum)
 As we discussed, I think you have a virus which could be the flu causing your asthma to be flared.  Your strep testing was negative.  We will contact you if we need to arrange antibiotic treatment based on your culture results.  Use your albuterol every 4-6 hours as needed for shortness of breath and coughing fits.  Start Symbicort twice daily.  Make sure to rinse your mouth following use of this medication to prevent thrush.  Use over-the-counter medication such as Tylenol, Flonase, nasal saline/sinus rinses.  Take Promethazine DM for cough.  This will make you sleepy so do not drive drink alcohol taking it.

## 2023-05-08 ENCOUNTER — Ambulatory Visit
Admission: EM | Admit: 2023-05-08 | Discharge: 2023-05-08 | Disposition: A | Discharge status: Home / Self Care | Payer: Managed Care, Other (non HMO) | Attending: Emergency Medicine | Admitting: Emergency Medicine

## 2023-05-08 ENCOUNTER — Encounter: Payer: Self-pay | Admitting: *Deleted

## 2023-05-08 ENCOUNTER — Other Ambulatory Visit: Payer: Self-pay

## 2023-05-08 DIAGNOSIS — J4540 Moderate persistent asthma, uncomplicated: Secondary | ICD-10-CM

## 2023-05-08 LAB — CULTURE, GROUP A STREP (THRC)

## 2023-05-08 MED ORDER — PREDNISONE 50 MG PO TABS: ORAL_TABLET | ORAL | 0 refills | Status: DC

## 2023-05-08 NOTE — ED Triage Notes (Signed)
 Pt reports she was here Friday- family has the flu- States she told she probably had the flu but was outside the window to be treated - test wasn't available. States she has been using her nebulizer at home but still is feeling SOB, congestion, sore throat and cough. Symptoms started 6 days ago

## 2023-05-08 NOTE — ED Provider Notes (Signed)
 [facility]    CSN: [id number] Arrival date & time: 05/08/23  0802      History   Chief Complaint Chief Complaint  Patient presents with   Shortness of Breath    HPI Emily Hester is a 53 y.o. female.   Pt complains of nasal congestion and cough.  Pt seen reports she was diagnosed with influenza.  Pt reports she is having problems with medication not relieving wheezing.  Pt is on albuterol.  Pt reports her nose is congested and she if having to mouth breath   The history is provided by the patient. No language interpreter was used.  Shortness of Breath Severity:  Moderate Onset quality:  Gradual Timing:  Constant Progression:  Worsening Chronicity:  New Relieved by:  Nothing Worsened by:  Nothing Associated symptoms: wheezing   Risk factors: no tobacco use     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   Status post revision of total hip 05/20/2020   Trochanteric bursitis, right hip 09/05/2019   Hyperlipidemia 08/31/2019   Diabetes mellitus (HCC) 08/31/2019   Arthritis 08/31/2019   Overactive bladder 08/31/2019   Hypertensive disorder 08/30/2018   Asthma 08/30/2018   Migraine 08/30/2018   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   Unilateral primary osteoarthritis, right hip 01/27/2017   IUD (intrauterine device) in place 01/13/2017    Past Surgical History:  Procedure Laterality Date   ANTERIOR HIP REVISION Right 05/20/2020    Procedure: RIGHT TOTAL HIP REVISION- ploy exhange and hip ball exchange with bone graft;  Surgeon: Kathryne Hitch, MD;  Location: [facility] OR;  Service: Orthopedics;  Laterality: Right;   DILATION AND EVACUATION  09/30/2011   Procedure: DILATATION AND EVACUATION;  Surgeon: Kelwin Gibler Andrea, MD;  Location: [facility];  Service: Gynecology;  Laterality: N/A;   left knee surgery      arthroscopy    TOTAL HIP ARTHROPLASTY Right 03/11/2017   Procedure: RIGHT TOTAL HIP ARTHROPLASTY ANTERIOR APPROACH;  Surgeon: Kathryne Hitch, MD;  Location: [facility];  Service: Orthopedics;  Laterality: Right;    OB History     Gravida  1   Para      Term      Preterm      AB      Living         SAB      IAB      Ectopic      Multiple      Live Births               Home Medications    Prior to Admission medications   Medication Sig Start Date End Date Taking? Authorizing Provider  predniSONE (DELTASONE) 50 MG tablet One tablet a day 05/08/23  Yes Cornella, Emmer, PA-C  acetaminophen (TYLENOL) 500 MG tablet Take 1,000 mg by mouth every 6 (six)  hours as needed for moderate pain.    [provider]  albuterol (PROVENTIL) (2.5 MG/3ML) 0.083% nebulizer solution Take 3 mLs (2.5 mg total) by nebulization every 6 (six) hours as needed for wheezing or shortness of breath. 05/06/23   Raspet, Erin K, PA-C  albuterol (VENTOLIN HFA) 108 (90 Base) MCG/ACT inhaler Inhale 2 puffs into the lungs every 4 (four) hours as needed. 12/31/22   Marcelyn Bruins, MD  amLODipine (NORVASC) 10 MG tablet Take 10 mg by mouth every morning.    [provider]  aspirin-acetaminophen-caffeine (EXCEDRIN MIGRAINE) [phone] MG tablet Take 2 tablets by mouth every 6 (six) hours as needed for headache.    [provider]  budesonide-formoterol (SYMBICORT) 160-4.5 MCG/ACT inhaler Inhale 2 puffs into the lungs in the morning and at bedtime. 05/06/23   Raspet, Noberto Retort, PA-C   butalbital-acetaminophen-caffeine (FIORICET) 50-325-40 MG tablet 1 tablet as needed Orally every 4 hrs for 4 days Patient not taking: Reported on 12/31/2022 07/23/22   [provider]  cetirizine (ZYRTEC) 10 MG tablet Take 1 tablet (10 mg total) by mouth daily. 12/31/22   Marcelyn Bruins, MD  Cholecalciferol (VITAMIN D) 50 MCG (2000 UT) tablet Take 2,000 Units by mouth daily.    [provider]  diazepam (VALIUM) 5 MG tablet Take one tablet by mouth with food one hour prior to procedure. May repeat 30 minutes prior if needed. Patient not taking: Reported on 05/06/2023 01/12/23   Juanda Chance, NP  diphenhydrAMINE (BENADRYL) 25 MG tablet Take 25 mg by mouth daily as needed for allergies.    [provider]  EPINEPHrine 0.3 mg/0.3 mL IJ SOAJ injection Inject 0.3 mg into the muscle as directed.    [provider]  gabapentin (NEURONTIN) 100 MG capsule Take 1 capsule (100 mg total) by mouth at bedtime. Patient not taking: Reported on 12/31/2022 10/16/20   Kirtland Bouchard, PA-C  HYDROcodone-acetaminophen [facility]) 7.5-325 MG tablet Take 1 tablet by mouth every 6 (six) hours as needed for moderate pain. 11/01/22   Kirtland Bouchard, PA-C  ibuprofen (ADVIL) 200 MG tablet Take 600 mg by mouth every 6 (six) hours as needed for headache or moderate pain.    [provider]  ipratropium (ATROVENT) 0.06 % nasal spray Apply 2 sprays each nostril up to 3-4 times a day as needed 12/31/22   Marcelyn Bruins, MD  Lancets [facility] DELICA PLUS [place]) MISC Apply 1 each topically daily. 04/19/22   [provider]  levonorgestrel (MIRENA) 20 MCG/24HR IUD 1 each by Intrauterine route once.    [provider]  lidocaine (LIDODERM) 5 % Place 1 patch onto the skin daily as needed (pain). Remove & Discard patch within 12 hours or as directed by MD 01/18/22   Kirtland Bouchard, PA-C  losartan (COZAAR) 25 MG tablet Take 25 mg by mouth daily. 10/20/16    [provider]  metFORMIN (GLUCOPHAGE-XR) 750 MG 24 hr tablet Take 750 mg by mouth 2 (two) times daily. 03/21/18   [provider]  montelukast (SINGULAIR) 10 MG tablet Take 1 tablet (10 mg total) by mouth at bedtime. 12/31/22   Marcelyn Bruins, MD  MOUNJARO 7.5 MG/0.5ML Pen Inject 7.5 mg into the skin once a week.    [provider]  nystatin cream (MYCOSTATIN) APPLY EXTERNALLY TWICE A DAY AS NEEDED 30    [provider]  Olopatadine HCl (PATADAY) 0.2 % SOLN Place 1 drop into both eyes daily as needed.  Patient not taking: Reported on 05/06/2023 12/31/22   Marcelyn Bruins, MD  omeprazole (PRILOSEC) 40 MG capsule Take 1 capsule by mouth daily.    [provider]  ondansetron (ZOFRAN) 8 MG tablet Take 8 mg by mouth every 8 (eight) hours as needed for nausea or vomiting.    [provider]  [facility] VERIO test strip as directed.  09/01/17   [provider]  oxybutynin (DITROPAN-XL) 10 MG 24 hr tablet Take 10 mg by mouth daily. 12/10/22   [provider]  promethazine-dextromethorphan (PROMETHAZINE-DM) 6.25-15 MG/5ML syrup Take 5 mLs by mouth 3 (three) times daily as needed for cough. 05/06/23   Raspet, Denny Peon K, PA-C  rosuvastatin (CRESTOR) 10 MG tablet Take 10 mg by mouth daily. 10/20/16   [provider]  Spacer/Aero-Holding Deretha Emory DEVI Take 1 each by mouth as directed. 10/06/20   Nehemiah Settle, FNP  tiZANidine (ZANAFLEX) 4 MG tablet TAKE 1 TABLET BY MOUTH EVERY 6 HOURS AS NEEDED FOR MUSCLE SPASMS. 08/25/21   Kathryne Hitch, MD  traMADol (ULTRAM) 50 MG tablet Take 1 tablet (50 mg total) by mouth every 8 (eight) hours as needed for moderate pain (pain score 4-6) or severe pain (pain score 7-10). 01/12/23   Juanda Chance, NP  zonisamide (ZONEGRAN) 25 MG capsule Take 50 mg by mouth 2 (two) times daily. 10/06/22   [provider]    Family History Family History  Problem Relation Age  of Onset   Diabetes Mother    Heart disease Mother    Diabetes Father    Eczema Sister    Asthma Brother    Eczema Daughter    Migraines Daughter    Migraines Daughter    Asthma Son    Allergic rhinitis Neg Hx    Angioedema Neg Hx    Immunodeficiency Neg Hx    Urticaria Neg Hx    Headache Neg Hx    Sleep apnea Neg Hx     Social History Social History   Tobacco Use   Smoking status: Never    Passive exposure: Current   Smokeless tobacco: Never  Vaping Use   Vaping status: Never Used  Substance Use Topics   Alcohol use: No   Drug use: No     Allergies   Amoxicillin, Lisinopril, Olmesartan, and Bactrim   Review of Systems Review of Systems  Respiratory:  Positive for shortness of breath and wheezing.   All other systems reviewed and are negative.    Physical Exam Triage Vital Signs ED Triage Vitals  Encounter Vitals Group     BP 05/08/23 0818 123/83     Systolic BP Percentile --      Diastolic BP Percentile --      Pulse Rate 05/08/23 0818 (!) 109     Resp 05/08/23 0818 20     Temp 05/08/23 0818 98.1 F (36.7 C)     Temp Source 05/08/23 0818 Oral     SpO2 05/08/23 0818 97 %     Weight --      Height --      Head Circumference --      Peak Flow --      Pain Score 05/08/23 0824 7     Pain Loc --      Pain Education --      Exclude from Growth Chart --    No data found.  Updated Vital Signs BP 123/83 (BP Location: Left Arm)   Pulse (!) 109  Temp 98.1 F (36.7 C) (Oral)   Resp 20   SpO2 97%   Visual Acuity Right Eye Distance:   Left Eye Distance:   Bilateral Distance:    Right Eye Near:   Left Eye Near:    Bilateral Near:     Physical Exam Vitals and nursing note reviewed.  Constitutional:      Appearance: She is well-developed.  HENT:     Head: Normocephalic.  Cardiovascular:     Rate and Rhythm: Normal rate and regular rhythm.  Pulmonary:     Effort: Pulmonary effort is normal.     Breath sounds: Normal breath sounds. No  decreased breath sounds.  Abdominal:     General: There is no distension.     Palpations: Abdomen is soft.  Musculoskeletal:        General: Normal range of motion.     Cervical back: Normal range of motion.  Skin:    General: Skin is warm.  Neurological:     Mental Status: She is alert and oriented to person, place, and time.  Psychiatric:        Mood and Affect: Mood normal.     UC Treatments / Results  Labs (all labs ordered are listed, but only abnormal results are displayed) Labs Reviewed - No data to display  EKG   Radiology No results found.  Procedures Procedures (including critical care time)  Medications Ordered in UC Medications - No data to display  Initial Impression / Assessment and Plan / UC Course  I have reviewed the triage vital signs and the nursing notes.  Pertinent labs & imaging results that were available during my care of the patient were reviewed by me and considered in my medical decision making (see chart for details).     An After Visit Summary was printed and given to the patient.  Final Clinical Impressions(s) / UC Diagnoses   Final diagnoses:  Moderate persistent asthma, unspecified whether complicated     Discharge Instructions      Return if any problems.     ED Prescriptions     Medication Sig Dispense Auth. Provider   predniSONE (DELTASONE) 50 MG tablet One tablet a day 5 tablet [place], [place]      PDMP not reviewed this encounter.   Fannye, [place], [place] 05/08/23 [phone]

## 2023-05-08 NOTE — Discharge Instructions (Signed)
 Return if any problems.

## 2023-05-09 ENCOUNTER — Encounter: Payer: Self-pay | Admitting: Allergy

## 2023-05-09 ENCOUNTER — Telehealth (HOSPITAL_COMMUNITY): Payer: Self-pay

## 2023-05-09 MED ORDER — AZITHROMYCIN 250 MG PO TABS: ORAL_TABLET | ORAL | 0 refills | Status: AC

## 2023-05-09 NOTE — Telephone Encounter (Signed)
 Pt reports no improvement in symptoms.  Azithromycin per protocol. Reviewed with patient, verified pharmacy, prescription sent

## 2023-05-13 ENCOUNTER — Other Ambulatory Visit: Payer: Managed Care, Other (non HMO)

## 2023-05-19 ENCOUNTER — Ambulatory Visit
Admission: RE | Admit: 2023-05-19 | Discharge: 2023-05-19 | Disposition: A | Discharge status: Home / Self Care | Payer: Managed Care, Other (non HMO) | Source: Ambulatory Visit | Attending: Orthopaedic Surgery | Admitting: Orthopaedic Surgery

## 2023-05-19 DIAGNOSIS — M7542 Impingement syndrome of left shoulder: Secondary | ICD-10-CM

## 2023-06-02 ENCOUNTER — Ambulatory Visit: Admitting: Physician Assistant

## 2023-06-02 ENCOUNTER — Telehealth: Payer: Self-pay | Admitting: Orthopaedic Surgery

## 2023-06-02 ENCOUNTER — Encounter: Payer: Self-pay | Admitting: Physician Assistant

## 2023-06-02 DIAGNOSIS — M19019 Primary osteoarthritis, unspecified shoulder: Secondary | ICD-10-CM

## 2023-06-02 DIAGNOSIS — M7542 Impingement syndrome of left shoulder: Secondary | ICD-10-CM

## 2023-06-02 MED ORDER — DICLOFENAC SODIUM 75 MG PO TBEC
75.0000 mg | DELAYED_RELEASE_TABLET | Freq: Two times a day (BID) | ORAL | 1 refills | Status: DC
Start: 2023-06-02 — End: 2023-09-28

## 2023-06-02 NOTE — Telephone Encounter (Signed)
 Pt submitted short term disability forms, cash payment of $20.00 and medical release form. Accepted 06/02/23

## 2023-06-02 NOTE — Progress Notes (Signed)
 HPI: Mrs. Sawchuk returns today follow-up of her left shoulder pain.  States that her shoulder pain is slightly improved.  She denies any radicular symptoms down the arm.  She was recently placed on an oral steroid due to an upper respiratory infection unsure if this helped with her shoulder.  She has otherwise been taking Motrin without any real relief.  MRI left shoulder without contrast dated 05/19/2023 showed mild supraspinatus tendinosis.  No rotator cuff tear.  AC joint with mild to moderate arthritic changes.  Glenohumeral joint will preserved.  No other acute findings.  Review of systems: See HPI.  Physical exam: Left shoulder forward flexion to 170 degrees.  Positive impingement on the left.  External and internal rotation against resistance bilaterally 5 out of 5.  Indican test is negative bilaterally.  Abduction test on the left is negative.  Slight tenderness over the left AC joint.  Impression: Left shoulder pain  Plan: She has had prior physical therapy and understands the exercises including those with Thera-Band which she has at home.  She will begin doing these on her own.  Will change her to diclofenac 75 mg twice daily.  She will stop her Motrin.  No Excedrin on the diclofenac.  Will also send her for [facility] joint injection on the left.  Have her follow-up in 6 weeks to see how she is doing overall.  Questions encouraged and answered at length.

## 2023-06-02 NOTE — Telephone Encounter (Signed)
 Patient is requesting intermittent leave for her husband to take her to appointmenta and care for her when she has flare-up. Please advise if okay and provide how many episodes and how long lasting and duration. Thank you!

## 2023-06-03 ENCOUNTER — Other Ambulatory Visit: Payer: Self-pay | Admitting: Radiology

## 2023-06-03 DIAGNOSIS — M7542 Impingement syndrome of left shoulder: Secondary | ICD-10-CM

## 2023-06-03 DIAGNOSIS — M19019 Primary osteoarthritis, unspecified shoulder: Secondary | ICD-10-CM

## 2023-06-10 NOTE — Telephone Encounter (Signed)
 IC,spoke with patient. Advised form completed and faxed. Copy at front desk to pick up along with the $ that was paid for form fee. We are still unable to collect payments for forms, money given back to patient and this was explained to patient.

## 2023-06-15 ENCOUNTER — Other Ambulatory Visit: Payer: Self-pay

## 2023-06-15 ENCOUNTER — Ambulatory Visit (INDEPENDENT_AMBULATORY_CARE_PROVIDER_SITE_OTHER): Admitting: Sports Medicine

## 2023-06-15 ENCOUNTER — Encounter: Payer: Self-pay | Admitting: Sports Medicine

## 2023-06-15 DIAGNOSIS — M25512 Pain in left shoulder: Secondary | ICD-10-CM

## 2023-06-15 DIAGNOSIS — M19012 Primary osteoarthritis, left shoulder: Secondary | ICD-10-CM

## 2023-06-15 DIAGNOSIS — M19019 Primary osteoarthritis, unspecified shoulder: Secondary | ICD-10-CM

## 2023-06-15 DIAGNOSIS — G8929 Other chronic pain: Secondary | ICD-10-CM

## 2023-06-15 MED ORDER — METHYLPREDNISOLONE ACETATE 40 MG/ML IJ SUSP
40.0000 mg | INTRAMUSCULAR | Status: AC | PRN
Start: 2023-06-15 — End: 2023-06-15
  Administered 2023-06-15: 40 mg via INTRA_ARTICULAR

## 2023-06-15 MED ORDER — LIDOCAINE HCL 1 % IJ SOLN
0.5000 mL | INTRAMUSCULAR | Status: AC | PRN
  Administered 2023-06-15: .5 mL

## 2023-06-15 NOTE — Progress Notes (Signed)
   Procedure Note  [name]             Date of Birth: 06/14/1970           MRN: [id number]             Visit Date: 06/15/2023  Procedures: Visit Diagnoses:  1. AC joint arthropathy   2. Chronic left shoulder pain    Medium Joint Inj: L acromioclavicular on 06/15/2023 11:37 AM Indications: pain Details: 25 G 1.5 in needle, ultrasound-guided anterior approach Medications: 0.5 mL lidocaine 1 %; 40 mg methylPREDNISolone acetate 40 MG/ML  US-guided AC Joint injection, left shoulder After discussion on risks/benefits/indications, informed verbal consent was obtained. A timeout was then performed. The patient was seated in examination room. The area overlying the [facility] joint of the shoulder was prepped with Betadine and alcohol swab then utilizing ultrasound guidance, patient's AC joint was injected using a 25G, 1.5" needle with 0.5:1.65mL lidocaine:depomedrol of injectate via an out-of-plane, walk-down approach. Visualization of injectate flow was noted under ultrasound guidance. Patient tolerated the procedure well without immediate complications.   Procedure, treatment alternatives, risks and benefits explained, specific risks discussed. Consent was given by the patient. Immediately prior to procedure a time out was called to verify the correct patient, procedure, equipment, support staff and site/side marked as required. Patient was prepped and draped in the usual sterile fashion.     -She did have questions regarding her tendinosis and her MRI, I did discuss this was a different location in the shoulder and likely this specific RC-tendon would not find benefit of this specifically from her [facility] joint injection (but this was for diagnostic and therapeutic purposes) -We did perform AC joint injection as above, postinjection protocol discussed -She has follow-up with Dr. Magnus Ivan for the shoulder specifically; I am happy to see her as needed  Madelyn Brunner, DO Primary Care Sports  Medicine Physician  [facility] - Orthopedics  This note was dictated using Dragon naturally speaking software and may contain errors in syntax, spelling, or content which have not been identified prior to signing this note.

## 2023-07-06 ENCOUNTER — Encounter: Payer: Self-pay | Admitting: Allergy

## 2023-07-06 ENCOUNTER — Ambulatory Visit (INDEPENDENT_AMBULATORY_CARE_PROVIDER_SITE_OTHER): Payer: Medicare Other | Admitting: Allergy

## 2023-07-06 ENCOUNTER — Other Ambulatory Visit: Payer: Self-pay

## 2023-07-06 VITALS — BP 116/74 | HR 79 | Temp 97.8°F | Resp 19

## 2023-07-06 DIAGNOSIS — H1013 Acute atopic conjunctivitis, bilateral: Secondary | ICD-10-CM | POA: Diagnosis not present

## 2023-07-06 DIAGNOSIS — J4540 Moderate persistent asthma, uncomplicated: Secondary | ICD-10-CM | POA: Diagnosis not present

## 2023-07-06 DIAGNOSIS — J3089 Other allergic rhinitis: Secondary | ICD-10-CM | POA: Diagnosis not present

## 2023-07-06 DIAGNOSIS — T7800XD Anaphylactic reaction due to unspecified food, subsequent encounter: Secondary | ICD-10-CM

## 2023-07-06 MED ORDER — BUDESONIDE-FORMOTEROL FUMARATE 160-4.5 MCG/ACT IN AERO: 2.0000 | INHALATION_SPRAY | Freq: Two times a day (BID) | RESPIRATORY_TRACT | 1 refills | Status: DC

## 2023-07-06 MED ORDER — MONTELUKAST SODIUM 10 MG PO TABS: 10.0000 mg | ORAL_TABLET | Freq: Every day | ORAL | 1 refills | Status: DC

## 2023-07-06 MED ORDER — OLOPATADINE HCL 0.2 % OP SOLN: 1.0000 [drp] | Freq: Every day | OPHTHALMIC | 1 refills | Status: AC | PRN

## 2023-07-06 MED ORDER — NEFFY 2 MG/0.1ML NA SOLN
1.0000 | NASAL | 1 refills | Status: DC | PRN
Start: 2023-07-06 — End: 2024-01-05

## 2023-07-06 MED ORDER — CETIRIZINE HCL 10 MG PO TABS: 10.0000 mg | ORAL_TABLET | Freq: Every day | ORAL | 1 refills | Status: AC

## 2023-07-06 MED ORDER — ALBUTEROL SULFATE (2.5 MG/3ML) 0.083% IN NEBU: 2.5000 mg | INHALATION_SOLUTION | Freq: Four times a day (QID) | RESPIRATORY_TRACT | 0 refills | Status: AC | PRN

## 2023-07-06 MED ORDER — ALBUTEROL SULFATE HFA 108 (90 BASE) MCG/ACT IN AERS: 2.0000 | INHALATION_SPRAY | RESPIRATORY_TRACT | 1 refills | Status: AC | PRN

## 2023-07-06 NOTE — Progress Notes (Signed)
 nefspi

## 2023-07-06 NOTE — Patient Instructions (Addendum)
 Asthma - Asthma action plan (if having respiratory illness or flare): Pulmicort  nebulizer 0.5mg  twice daily for 1-2 weeks.   - continue Symbicort  160mcg 2 puffs twice a day with spacer to help prevent cough and wheeze.   - continue Singulair  (montelukast ) 10mg  nightly - have access to albuterol  inhaler 2 puffs every 4-6 hours as needed for cough/wheeze/shortness of breath/chest tightness.  May use 15-20 minutes prior to activity.   Monitor frequency of use.  - recommend use of your albuterol  prior to outdoor activity, grilling etc - continue to do your best to avoid fragrance products Asthma control goals:  Full participation in all desired activities (may need albuterol  before activity) Albuterol  use two time or less a week on average (not counting use with activity) Cough interfering with sleep two time or less a month Oral steroids no more than once a year No hospitalizations  Allergic Rhinitis with conjunctivitis - continue allergen avoidance measures - continue Zyrtec  (Cetirizine ) 10 mg daily.  May take additional dose if needed for more control . - continue Singulair  (montelukast ) nightly - for nasal drainage/post-nasal drip use nasal Atrovent  0.06% 2 sprays each nostril up to 3-4 times a day as needed.   Recommend using in evening/prior to bedtime.  - use over-the-counter Pataday  Xtra Strength, Pataday  1 drop each eye daily or Zaditor or Alaway 1 drop each eye twice a day as needed for itchy/watery/red eyes.   These are all over-the-counter allergy eyedrop you can use.   Food allergy - continue to avoid peanuts and tree nuts - in case of an allergic reaction, give Benadryl  4 teaspoonfuls every 4 hours, and if life-threatening symptoms occur, inject with EpiPen  0.3 mg.  Discussed option of Neffy, new nasal epinephrine  device, to have as well in case of allergic reaction.  This is a needle-free device that is more temperature stable and shelf stable  Follow-up in 6 months or sooner if  needed

## 2023-07-06 NOTE — Progress Notes (Signed)
 Follow-up Note  RE: Emily Hester MRN: [id number] DOB: 22-Nov-1970 Date of Office Visit: 07/06/2023   History of present illness: Emily Hester is a 53 y.o. female presenting today for follow-up of asthma, allergic rhinitis with conjunctivitis and food allergy.  She was last seen in the office on 12/31/2022 by myself. Discussed the use of AI scribe software for clinical note transcription with the patient, who gave verbal consent to proceed.  She has experienced persistent raspiness of her voice for the past two months following a bout of flu and strep throat, which exacerbated her asthma. Despite an evaluation by an ear, nose, and throat specialist who found no significant issues, the raspiness persists. No signs of reflux are present, and the raspiness 'comes and goes.'  During the illness, she was using Pulmicort  via nebulizer on top of her routine Symbicort  but has since stopped and is currently only using Symbicort  as her day-to-day asthma medication. She has not needed her rescue inhaler since recovering from the flu. She also had thrush during her illness, but this was ruled out as a current issue by the ENT specialist as she states she did have a scope done.  She does not feel much nasal drip but acknowledges some throat clearing, suggesting possible drainage. She has not been using Atrovent  nasal spray recently but states she likely does need to restart using this. She is currently on Zyrtec  and Singulair , which she feels are managing her symptoms well during the pollen season.  She has not needed to use her EpiPen  device recently.   She has been following a Mediterranean diet and exercising, which has resulted in weight loss!   Review of systems: 10pt ROS negative unless noted above in HPI  Past medical/social/surgical/family history have been reviewed and are unchanged unless specifically indicated below.  No changes  Medication List: Current Outpatient  Medications  Medication Sig Dispense Refill   acetaminophen  (TYLENOL ) 500 MG tablet Take 1,000 mg by mouth every 6 (six) hours as needed for moderate pain.     amLODipine  (NORVASC ) 10 MG tablet Take 10 mg by mouth every morning.     aspirin -acetaminophen -caffeine (EXCEDRIN MIGRAINE) 250-250-65 MG tablet Take 2 tablets by mouth every 6 (six) hours as needed for headache.     butalbital-acetaminophen -caffeine (FIORICET) 50-325-40 MG tablet      Cholecalciferol  (VITAMIN D ) 50 MCG (2000 UT) tablet Take 2,000 Units by mouth daily.     diclofenac  (VOLTAREN ) 75 MG EC tablet Take 1 tablet (75 mg total) by mouth 2 (two) times daily. 30 tablet 1   diphenhydrAMINE  (BENADRYL ) 25 MG tablet Take 25 mg by mouth daily as needed for allergies.     EPINEPHrine  0.3 mg/0.3 mL IJ SOAJ injection Inject 0.3 mg into the muscle as directed.     ipratropium (ATROVENT ) 0.06 % nasal spray Apply 2 sprays each nostril up to 3-4 times a day as needed 45 mL 1   Lancets (ONETOUCH DELICA PLUS LANCET33G) MISC Apply 1 each topically daily.     levonorgestrel  (MIRENA ) 20 MCG/24HR IUD 1 each by Intrauterine route once.     lidocaine  (LIDODERM ) 5 % Place 1 patch onto the skin daily as needed (pain). Remove & Discard patch within 12 hours or as directed by MD 30 patch 3   losartan  (COZAAR ) 25 MG tablet Take 25 mg by mouth daily.  0   metFORMIN  (GLUCOPHAGE -XR) 750 MG 24 hr tablet Take 750 mg by mouth 2 (two) times daily.  nystatin cream (MYCOSTATIN) APPLY EXTERNALLY TWICE A DAY AS NEEDED 30     omeprazole  (PRILOSEC) 40 MG capsule Take 1 capsule by mouth daily.     ondansetron  (ZOFRAN ) 8 MG tablet Take 8 mg by mouth every 8 (eight) hours as needed for nausea or vomiting.     ONETOUCH VERIO test strip as directed.   3   promethazine -dextromethorphan (PROMETHAZINE -DM) 6.25-15 MG/5ML syrup Take 5 mLs by mouth 3 (three) times daily as needed for cough. 118 mL 0   rosuvastatin  (CRESTOR ) 10 MG tablet Take 10 mg by mouth daily.  3    solifenacin (VESICARE) 5 MG tablet Take 5 mg by mouth daily.     Spacer/Aero-Holding Chambers DEVI Take 1 each by mouth as directed. 1 each 0   tirzepatide (MOUNJARO) 12.5 MG/0.5ML Pen Inject 12.5 mg into the skin once a week.     zonisamide (ZONEGRAN) 25 MG capsule Take 50 mg by mouth 2 (two) times daily.     albuterol  (PROVENTIL ) (2.5 MG/3ML) 0.083% nebulizer solution Take 3 mLs (2.5 mg total) by nebulization every 6 (six) hours as needed for wheezing or shortness of breath. 75 mL 0   albuterol  (VENTOLIN  HFA) 108 (90 Base) MCG/ACT inhaler Inhale 2 puffs into the lungs every 4 (four) hours as needed. 18 g 1   budesonide -formoterol  (SYMBICORT ) 160-4.5 MCG/ACT inhaler Inhale 2 puffs into the lungs in the morning and at bedtime. 1 each 1   cetirizine  (ZYRTEC ) 10 MG tablet Take 1 tablet (10 mg total) by mouth daily. 90 tablet 1   EPINEPHrine  (NEFFY) 2 MG/0.1ML SOLN Place 1 spray into the nose as needed. 6 each 1   montelukast  (SINGULAIR ) 10 MG tablet Take 1 tablet (10 mg total) by mouth at bedtime. 90 tablet 1   MOUNJARO 7.5 MG/0.5ML Pen Inject 7.5 mg into the skin once a week. (Patient not taking: Reported on 07/06/2023)     Olopatadine  HCl (PATADAY ) 0.2 % SOLN Place 1 drop into both eyes daily as needed. 7.5 mL 1   oxybutynin (DITROPAN-XL) 10 MG 24 hr tablet Take 10 mg by mouth daily. (Patient not taking: Reported on 07/06/2023)     No current facility-administered medications for this visit.     Known medication allergies: Allergies  Allergen Reactions   Amoxicillin Hives, Swelling and Other (See Comments)    Has patient had a PCN reaction causing immediate rash, facial/tongue/throat swelling, SOB or lightheadedness with hypotension: Yes Has patient had a PCN reaction causing severe rash involving mucus membranes or skin necrosis: No Has patient had a PCN reaction that required hospitalization: Yes Has patient had a PCN reaction occurring within the last 10 years: No  If all of the above  answers are "NO", then may proceed with Cephalosporin use.    Lisinopril Itching and Cough    Heart racing    Olmesartan     jittery   Bactrim Rash     Physical examination: Blood pressure 116/74, pulse 79, temperature 97.8 F (36.6 C), temperature source Temporal, resp. rate 19, SpO2 98%.  General: Alert, interactive, in no acute distress. HEENT: PERRLA, TMs pearly gray, turbinates mildly edematous without discharge, post-pharynx non erythematous. Neck: Supple without lymphadenopathy. Lungs: Clear to auscultation without wheezing, rhonchi or rales. {no increased work of breathing. CV: Normal S1, S2 without murmurs. Abdomen: Nondistended, nontender. Skin: Warm and dry, without lesions or rashes. Extremities:  No clubbing, cyanosis or edema. Neuro:   Grossly intact.  Diagnositics/Labs:  Spirometry: FEV1: 1.88L 79%, FVC: 2.35L  78%, ratio consistent with nonobstructive pattern  Assessment and plan: Asthma - Asthma action plan (if having respiratory illness or flare): Pulmicort  nebulizer 0.5mg  twice daily for 1-2 weeks.   - continue Symbicort  160mcg 2 puffs twice a day with spacer to help prevent cough and wheeze.   - continue Singulair  (montelukast ) 10mg  nightly - have access to albuterol  inhaler 2 puffs every 4-6 hours as needed for cough/wheeze/shortness of breath/chest tightness.  May use 15-20 minutes prior to activity.   Monitor frequency of use.  - recommend use of your albuterol  prior to outdoor activity, grilling etc - continue to do your best to avoid fragrance products Asthma control goals:  Full participation in all desired activities (may need albuterol  before activity) Albuterol  use two time or less a week on average (not counting use with activity) Cough interfering with sleep two time or less a month Oral steroids no more than once a year No hospitalizations  Allergic Rhinitis with conjunctivitis - continue allergen avoidance measures - continue Zyrtec   (Cetirizine ) 10 mg daily.  May take additional dose if needed for more control . - continue Singulair  (montelukast ) nightly - for nasal drainage/post-nasal drip use nasal Atrovent  0.06% 2 sprays each nostril up to 3-4 times a day as needed.   Recommend using in evening/prior to bedtime.  - use over-the-counter Pataday  Xtra Strength, Pataday  1 drop each eye daily or Zaditor or Alaway 1 drop each eye twice a day as needed for itchy/watery/red eyes.   These are all over-the-counter allergy eyedrop you can use.   Food allergy - continue to avoid peanuts and tree nuts - in case of an allergic reaction, give Benadryl  4 teaspoonfuls every 4 hours, and if life-threatening symptoms occur, inject with EpiPen  0.3 mg.  Discussed option of Neffy, new nasal epinephrine  device, to have as well in case of allergic reaction.  This is a needle-free device that is more temperature stable and shelf stable  Follow-up in 6 months or sooner if needed   I appreciate the opportunity to take part in Guliana's care. Please do not hesitate to contact me with questions.  Sincerely,   Catha Clink, MD Allergy/Immunology [facility]

## 2023-07-18 ENCOUNTER — Ambulatory Visit (INDEPENDENT_AMBULATORY_CARE_PROVIDER_SITE_OTHER): Admitting: Orthopaedic Surgery

## 2023-07-18 ENCOUNTER — Encounter: Payer: Self-pay | Admitting: Orthopaedic Surgery

## 2023-07-18 DIAGNOSIS — G8929 Other chronic pain: Secondary | ICD-10-CM

## 2023-07-18 DIAGNOSIS — M7542 Impingement syndrome of left shoulder: Secondary | ICD-10-CM

## 2023-07-18 DIAGNOSIS — M25512 Pain in left shoulder: Secondary | ICD-10-CM | POA: Diagnosis not present

## 2023-07-18 DIAGNOSIS — M19019 Primary osteoarthritis, unspecified shoulder: Secondary | ICD-10-CM

## 2023-07-18 NOTE — Progress Notes (Signed)
 The patient comes in for follow-up after having an [facility] joint injection under ultrasound guidance by Dr. Vaughn Georges in her left shoulder AC joint.  She says that it really helped her quite a bit but she still having a little bit of deltoid and biceps pain.  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  Since have seen her last she is lost 40 pounds.  She is continuing on the weight loss journey and certainly I can tell just looking at her face and how she looks overall she is lost significant weight and I think that is can to continue to help her.  She is very motivated working on her diet and exercise regimen and she is participating in water  aerobics.  Her left shoulder moves smoothly and fluidly.  There is no block to rotation of the shoulder and no weakness.  Her right hip also moves smoothly and fluidly.  She will continue her regular exercise and aerobic activities.  Will see her back in 6 weeks to see how she is doing with her left shoulder and at that visit we need to obtain a standing low AP pelvis and lateral of her right total hip.

## 2023-07-28 ENCOUNTER — Encounter: Payer: Self-pay | Admitting: Orthopaedic Surgery

## 2023-07-29 ENCOUNTER — Other Ambulatory Visit: Payer: Self-pay | Admitting: Orthopaedic Surgery

## 2023-07-29 MED ORDER — LIDOCAINE 5 % EX PTCH: 1.0000 | MEDICATED_PATCH | Freq: Every day | CUTANEOUS | 3 refills | Status: DC | PRN

## 2023-08-16 ENCOUNTER — Encounter (INDEPENDENT_AMBULATORY_CARE_PROVIDER_SITE_OTHER): Payer: Self-pay

## 2023-08-16 DIAGNOSIS — Z0289 Encounter for other administrative examinations: Secondary | ICD-10-CM

## 2023-08-25 ENCOUNTER — Telehealth: Payer: Self-pay | Admitting: Orthopaedic Surgery

## 2023-08-25 NOTE — Telephone Encounter (Signed)
 Patient submitted FMLA for spouse. She is requesting it for 12 months duration. I advised patient that last form was approved by Dwyane Glad for 3 months which was done 06/10/23.  Please advise. Thank you!

## 2023-08-25 NOTE — Telephone Encounter (Signed)
 Noted

## 2023-08-29 ENCOUNTER — Ambulatory Visit: Admitting: Orthopaedic Surgery

## 2023-09-14 ENCOUNTER — Ambulatory Visit (INDEPENDENT_AMBULATORY_CARE_PROVIDER_SITE_OTHER): Admitting: Orthopaedic Surgery

## 2023-09-14 ENCOUNTER — Other Ambulatory Visit (INDEPENDENT_AMBULATORY_CARE_PROVIDER_SITE_OTHER): Payer: Self-pay

## 2023-09-14 ENCOUNTER — Encounter: Payer: Self-pay | Admitting: Orthopaedic Surgery

## 2023-09-14 DIAGNOSIS — M25512 Pain in left shoulder: Secondary | ICD-10-CM

## 2023-09-14 DIAGNOSIS — Z96641 Presence of right artificial hip joint: Secondary | ICD-10-CM

## 2023-09-14 DIAGNOSIS — G8929 Other chronic pain: Secondary | ICD-10-CM

## 2023-09-14 MED ORDER — CYCLOBENZAPRINE HCL 10 MG PO TABS: 10.0000 mg | ORAL_TABLET | Freq: Three times a day (TID) | ORAL | 1 refills | Status: AC | PRN

## 2023-09-14 NOTE — Progress Notes (Signed)
 The patient comes in for follow-up after having a steroid injection in her left shoulder AC joint under ultrasound by Dr. Burnetta.  She has had a subacromial injection as well.  She continues to have left shoulder pain that has been chronic for her.  She hurts with overhead activities and reaching behind her.  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  We actually replaced her right hip back in 2018 and then took her to surgery in 2022 and performed a hip ball and liner exchange.  We do not find a loosening of the femoral component fortunately.  I did will get new x-rays today since it has been a while.  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  She denies any groin pain.  She has lost 40 pounds since I saw her last.  She looks very healthy and is mobilizing better.  Examination of her left shoulder still shows a lot of pain with reaching behind her and overhead with no weakness in the rotator cuff but definitely still having signs of impingement and pain at the [facility] joint as well.  Her right hip moves smoothly and fluidly with no blocks or rotation.  Standing AP pelvis and lateral the right hip shows the implants of bone ingrown of her right hip joint and no complicating features of the components.  At this point I would like to send her to my partner Dr. Genelle to evaluate her shoulder for the possibility of an arthroscopic intervention for her left shoulder given her continued pain combined with the failure of conservative treatment.  She is agreeable to this referral.

## 2023-09-28 ENCOUNTER — Encounter: Payer: Self-pay | Admitting: Orthopaedic Surgery

## 2023-09-28 ENCOUNTER — Other Ambulatory Visit: Payer: Self-pay | Admitting: Orthopaedic Surgery

## 2023-09-28 MED ORDER — NABUMETONE 750 MG PO TABS: 750.0000 mg | ORAL_TABLET | Freq: Two times a day (BID) | ORAL | 1 refills | Status: DC | PRN

## 2023-10-14 ENCOUNTER — Encounter (HOSPITAL_BASED_OUTPATIENT_CLINIC_OR_DEPARTMENT_OTHER): Payer: Self-pay

## 2023-10-14 ENCOUNTER — Other Ambulatory Visit (HOSPITAL_BASED_OUTPATIENT_CLINIC_OR_DEPARTMENT_OTHER): Payer: Self-pay

## 2023-10-14 ENCOUNTER — Ambulatory Visit (HOSPITAL_BASED_OUTPATIENT_CLINIC_OR_DEPARTMENT_OTHER): Admitting: Orthopaedic Surgery

## 2023-10-14 DIAGNOSIS — M25512 Pain in left shoulder: Secondary | ICD-10-CM

## 2023-10-14 DIAGNOSIS — G8929 Other chronic pain: Secondary | ICD-10-CM | POA: Diagnosis not present

## 2023-10-14 MED ORDER — ACETAMINOPHEN 500 MG PO TABS
500.0000 mg | ORAL_TABLET | Freq: Three times a day (TID) | ORAL | 0 refills | Status: AC
  Filled 2023-10-14: qty 30, 10d supply, fill #0

## 2023-10-14 MED ORDER — ASPIRIN 325 MG PO TBEC
325.0000 mg | DELAYED_RELEASE_TABLET | Freq: Every day | ORAL | 0 refills | Status: AC
  Filled 2023-10-14: qty 14, 14d supply, fill #0

## 2023-10-14 MED ORDER — IBUPROFEN 800 MG PO TABS
800.0000 mg | ORAL_TABLET | Freq: Three times a day (TID) | ORAL | 0 refills | Status: AC
  Filled 2023-10-14: qty 30, 10d supply, fill #0

## 2023-10-14 MED ORDER — OXYCODONE HCL 5 MG PO TABS
5.0000 mg | ORAL_TABLET | ORAL | 0 refills | Status: DC | PRN
  Filled 2023-10-14: qty 10, 2d supply, fill #0

## 2023-10-14 NOTE — Progress Notes (Signed)
 Chief Complaint: Left shoulder pain     History of Present Illness:    Emily Hester is a 53 y.o. female presents today as a referral from Dr. Vernetta for continued left shoulder pain.  She has been seeing Dr. Vernetta who is provided 2 additional steroid injections.  She has not got any relief from this.  She is doing physical therapy for strengthening although again she is somewhat limited in terms of her overhead motion and pain.  She does have a history of contralateral rotator cuff repair which she ultimately did do well with although she has some concerns over the time that it took her to recover.  She is here today for further discussion as a referral from my partner    PMH/PSH/Family History/Social History/Meds/Allergies:    Past Medical History:  Diagnosis Date   Arthritis    patient denies   Asthma    Diabetes mellitus without complication (HCC)    type    GERD (gastroesophageal reflux disease)    Hypertension    Interstitial cystitis    IUD (intrauterine device) in place placed 6 weeks ago   Migraines    MIgraines- not current     Pneumonia    Past Surgical History:  Procedure Laterality Date   ANTERIOR HIP REVISION Right 05/20/2020   Procedure: RIGHT TOTAL HIP REVISION- ploy exhange and hip ball exchange with bone graft;  Surgeon: Vernetta Lonni GRADE, MD;  Location: [facility] OR;  Service: Orthopedics;  Laterality: Right;   DILATION AND EVACUATION  09/30/2011   Procedure: DILATATION AND EVACUATION;  Surgeon: Lynwood FORBES Curlene PONCE, MD;  Location: [facility] ORS;  Service: Gynecology;  Laterality: N/A;   left knee surgery      arthroscopy    TOTAL HIP ARTHROPLASTY Right 03/11/2017   Procedure: RIGHT TOTAL HIP ARTHROPLASTY ANTERIOR APPROACH;  Surgeon: Vernetta Lonni GRADE, MD;  Location: [facility] ORS;  Service: Orthopedics;  Laterality: Right;   Social History   Socioeconomic History   Marital status: Married    Spouse name: Not on file   Number of children: Not on file    Years of education: Not on file   Highest education level: Not on file  Occupational History   Not on file  Tobacco Use   Smoking status: Never    Passive exposure: Current   Smokeless tobacco: Never  Vaping Use   Vaping status: Never Used  Substance and Sexual Activity   Alcohol use: No   Drug use: No   Sexual activity: Not on file  Other Topics Concern   Not on file  Social History Narrative   Not on file   Social Drivers of Health   Financial Resource Strain: Not on file  Food Insecurity: Low Risk  (07/19/2023)   Received from [facility]   Hunger Vital Sign    Within the past 12 months, you worried that your food would run out before you got money to buy more: Never true    Within the past 12 months, the food you bought just didn't last and you didn't have money to get more. : Never true  Transportation Needs: No Transportation Needs (07/19/2023)   Received from [facility]    In the past 12 months, has lack of reliable transportation kept you from medical appointments, meetings, work or from getting things needed for daily living? : No  Physical Activity: Not on file  Stress: Not on file  Social Connections: Not on  file   Family History  Problem Relation Age of Onset   Diabetes Mother    Heart disease Mother    Diabetes Father    Eczema Sister    Asthma Brother    Eczema Daughter    Migraines Daughter    Migraines Daughter    Asthma Son    Allergic rhinitis Neg Hx    Angioedema Neg Hx    Immunodeficiency Neg Hx    Urticaria Neg Hx    Headache Neg Hx    Sleep apnea Neg Hx    Allergies  Allergen Reactions   Amoxicillin Hives, Swelling and Other (See Comments)    Has patient had a PCN reaction causing immediate rash, facial/tongue/throat swelling, SOB or lightheadedness with hypotension: Yes Has patient had a PCN reaction causing severe rash involving mucus membranes or skin necrosis: No Has patient had a PCN reaction that required  hospitalization: Yes Has patient had a PCN reaction occurring within the last 10 years: No  If all of the above answers are NO, then may proceed with Cephalosporin use.    Lisinopril Itching and Cough    Heart racing    Olmesartan     jittery   Bactrim Rash   Current Outpatient Medications  Medication Sig Dispense Refill   acetaminophen  (TYLENOL ) 500 MG tablet Take 1 tablet (500 mg total) by mouth every 8 (eight) hours for 10 days. 30 tablet 0   aspirin  EC 325 MG tablet Take 1 tablet (325 mg total) by mouth daily. 14 tablet 0   ibuprofen  (ADVIL ) 800 MG tablet Take 1 tablet (800 mg total) by mouth every 8 (eight) hours for 10 days. Please take with food, please alternate with acetaminophen  30 tablet 0   nabumetone  (RELAFEN ) 750 MG tablet Take 1 tablet (750 mg total) by mouth 2 (two) times daily as needed. 60 tablet 1   oxyCODONE  (ROXICODONE ) 5 MG immediate release tablet Take 1 tablet (5 mg total) by mouth every 4 (four) hours as needed for severe pain (pain score 7-10) or breakthrough pain. 10 tablet 0   acetaminophen  (TYLENOL ) 500 MG tablet Take 1,000 mg by mouth every 6 (six) hours as needed for moderate pain.     albuterol  (PROVENTIL ) (2.5 MG/3ML) 0.083% nebulizer solution Take 3 mLs (2.5 mg total) by nebulization every 6 (six) hours as needed for wheezing or shortness of breath. 75 mL 0   albuterol  (VENTOLIN  HFA) 108 (90 Base) MCG/ACT inhaler Inhale 2 puffs into the lungs every 4 (four) hours as needed. 18 g 1   amLODipine  (NORVASC ) 10 MG tablet Take 10 mg by mouth every morning.     aspirin -acetaminophen -caffeine (EXCEDRIN MIGRAINE) 250-250-65 MG tablet Take 2 tablets by mouth every 6 (six) hours as needed for headache.     budesonide -formoterol  (SYMBICORT ) 160-4.5 MCG/ACT inhaler Inhale 2 puffs into the lungs in the morning and at bedtime. 1 each 1   butalbital-acetaminophen -caffeine (FIORICET) 50-325-40 MG tablet      cetirizine  (ZYRTEC ) 10 MG tablet Take 1 tablet (10 mg total) by  mouth daily. 90 tablet 1   Cholecalciferol  (VITAMIN D ) 50 MCG (2000 UT) tablet Take 2,000 Units by mouth daily.     cyclobenzaprine  (FLEXERIL ) 10 MG tablet Take 1 tablet (10 mg total) by mouth 3 (three) times daily as needed for muscle spasms. 60 tablet 1   diphenhydrAMINE  (BENADRYL ) 25 MG tablet Take 25 mg by mouth daily as needed for allergies.     EPINEPHrine  (NEFFY ) 2 MG/0.1ML SOLN Place 1  spray into the nose as needed. 6 each 1   EPINEPHrine  0.3 mg/0.3 mL IJ SOAJ injection Inject 0.3 mg into the muscle as directed.     ipratropium (ATROVENT ) 0.06 % nasal spray Apply 2 sprays each nostril up to 3-4 times a day as needed 45 mL 1   Lancets (ONETOUCH DELICA PLUS LANCET33G) MISC Apply 1 each topically daily.     levonorgestrel  (MIRENA ) 20 MCG/24HR IUD 1 each by Intrauterine route once.     lidocaine  (LIDODERM ) 5 % Place 1 patch onto the skin daily as needed (pain). Remove & Discard patch within 12 hours or as directed by MD 30 patch 3   losartan  (COZAAR ) 25 MG tablet Take 25 mg by mouth daily.  0   metFORMIN  (GLUCOPHAGE -XR) 750 MG 24 hr tablet Take 750 mg by mouth 2 (two) times daily.     montelukast  (SINGULAIR ) 10 MG tablet Take 1 tablet (10 mg total) by mouth at bedtime. 90 tablet 1   MOUNJARO 7.5 MG/0.5ML Pen Inject 7.5 mg into the skin once a week. (Patient not taking: Reported on 07/06/2023)     nystatin cream (MYCOSTATIN) APPLY EXTERNALLY TWICE A DAY AS NEEDED 30     Olopatadine  HCl (PATADAY ) 0.2 % SOLN Place 1 drop into both eyes daily as needed. 7.5 mL 1   omeprazole  (PRILOSEC) 40 MG capsule Take 1 capsule by mouth daily.     ondansetron  (ZOFRAN ) 8 MG tablet Take 8 mg by mouth every 8 (eight) hours as needed for nausea or vomiting.     ONETOUCH VERIO test strip as directed.   3   oxybutynin (DITROPAN-XL) 10 MG 24 hr tablet Take 10 mg by mouth daily. (Patient not taking: Reported on 07/06/2023)     promethazine -dextromethorphan (PROMETHAZINE -DM) 6.25-15 MG/5ML syrup Take 5 mLs by mouth 3  (three) times daily as needed for cough. 118 mL 0   rosuvastatin  (CRESTOR ) 10 MG tablet Take 10 mg by mouth daily.  3   solifenacin (VESICARE) 5 MG tablet Take 5 mg by mouth daily.     Spacer/Aero-Holding Chambers DEVI Take 1 each by mouth as directed. 1 each 0   tirzepatide (MOUNJARO) 12.5 MG/0.5ML Pen Inject 12.5 mg into the skin once a week.     zonisamide (ZONEGRAN) 25 MG capsule Take 50 mg by mouth 2 (two) times daily.     No current facility-administered medications for this visit.   No results found.  Review of Systems:   A ROS was performed including pertinent positives and negatives as documented in the HPI.  Physical Exam :   Constitutional: NAD and appears stated age Neurological: Alert and oriented Psych: Appropriate affect and cooperative There were no vitals taken for this visit.   Comprehensive Musculoskeletal Exam:    Left shoulder with tenderness about the biceps with positive speeds maneuver.  Positive tenderness about the [facility] joint.  There is positive Neer impingement, forward elevation is 260 degrees with external rotation at side to 45 degrees internal rotation is L1.  2+ radial pulse   Imaging:   Xray (3 views left shoulder): Normal  MRI (left shoulder): There is evidence of AC spurring and AC joint arthritis with bursal sided fraying consistent with rotator cuff impingement as well as biceps tendinopathy   I personally reviewed and interpreted the radiographs.   Assessment and Plan:   53 y.o. female [place] female with left shoulder pain consistent with rotator cuff impingement in the setting of an AC spur.  I described that  her biceps does also show evidence of tearing.  Given this I would ultimately recommend additional treatment given the fact that she has failed injections as well as physical therapy.  I did discuss treatment options including left shoulder arthroscopy with distal clavicle excision, acromioplasty, biceps tenodesis.  I did discuss the  risks and limitations as well as associated recovery timeframe.  After discussion she would like to proceed  -Plan for left shoulder arthroscopy with distal clavicle excision, acromioplasty, biceps tenodesis   After a lengthy discussion of treatment options, including risks, benefits, alternatives, complications of surgical and nonsurgical conservative options, the patient elected surgical repair.   The patient  is aware of the material risks  and complications including, but not limited to injury to adjacent structures, neurovascular injury, infection, numbness, bleeding, implant failure, thermal burns, stiffness, persistent pain, failure to heal, disease transmission from allograft, need for further surgery, dislocation, anesthetic risks, blood clots, risks of death,and others. The probabilities of surgical success and failure discussed with patient given their particular co-morbidities.The time and nature of expected rehabilitation and recovery was discussed.The patient's questions were all answered preoperatively.  No barriers to understanding were noted. I explained the natural history of the disease process and Rx rationale.  I explained to the patient what I considered to be reasonable expectations given their personal situation.  The final treatment plan was arrived at through a shared patient decision making process model.   I personally saw and evaluated the patient, and participated in the management and treatment plan.  Elspeth Parker, MD Attending Physician, Orthopedic Surgery  This document was dictated using Dragon voice recognition software. A reasonable attempt at proof reading has been made to minimize errors.

## 2023-10-17 ENCOUNTER — Encounter (INDEPENDENT_AMBULATORY_CARE_PROVIDER_SITE_OTHER): Payer: Self-pay

## 2023-10-20 ENCOUNTER — Encounter (INDEPENDENT_AMBULATORY_CARE_PROVIDER_SITE_OTHER): Payer: Self-pay | Admitting: Family Medicine

## 2023-10-20 ENCOUNTER — Ambulatory Visit (INDEPENDENT_AMBULATORY_CARE_PROVIDER_SITE_OTHER): Admitting: Family Medicine

## 2023-10-20 VITALS — BP 116/76 | HR 85 | Temp 98.6°F | Ht 64.5 in | Wt 237.0 lb

## 2023-10-20 DIAGNOSIS — Z91018 Allergy to other foods: Secondary | ICD-10-CM

## 2023-10-20 DIAGNOSIS — E1169 Type 2 diabetes mellitus with other specified complication: Secondary | ICD-10-CM | POA: Diagnosis not present

## 2023-10-20 DIAGNOSIS — Z7985 Long-term (current) use of injectable non-insulin antidiabetic drugs: Secondary | ICD-10-CM

## 2023-10-20 DIAGNOSIS — R5383 Other fatigue: Secondary | ICD-10-CM | POA: Diagnosis not present

## 2023-10-20 DIAGNOSIS — I10 Essential (primary) hypertension: Secondary | ICD-10-CM | POA: Diagnosis not present

## 2023-10-20 DIAGNOSIS — R0602 Shortness of breath: Secondary | ICD-10-CM

## 2023-10-20 DIAGNOSIS — E669 Obesity, unspecified: Secondary | ICD-10-CM

## 2023-10-20 DIAGNOSIS — E739 Lactose intolerance, unspecified: Secondary | ICD-10-CM

## 2023-10-20 DIAGNOSIS — E785 Hyperlipidemia, unspecified: Secondary | ICD-10-CM

## 2023-10-20 DIAGNOSIS — R0609 Other forms of dyspnea: Secondary | ICD-10-CM | POA: Insufficient documentation

## 2023-10-20 DIAGNOSIS — E559 Vitamin D deficiency, unspecified: Secondary | ICD-10-CM

## 2023-10-20 DIAGNOSIS — Z6841 Body Mass Index (BMI) 40.0 and over, adult: Secondary | ICD-10-CM

## 2023-10-20 NOTE — Progress Notes (Signed)
 Office: [phone]  /  Fax: [phone]  WEIGHT SUMMARY AND BIOMETRICS  Anthropometric Measurements Height: 5' 4.5 (1.638 m) Weight: 237 lb (107.5 kg) BMI (Calculated): 40.07 Weight at Last Visit: 0lb Weight Lost Since Last Visit: 0lb Weight Gained Since Last Visit: 0lb Starting Weight: 237lb Total Weight Loss (lbs): 0 lb (0 kg) Peak Weight: 280lb Waist Measurement : 51 inches   Body Composition  Body Fat %: 48.7 % Fat Mass (lbs): 115.8 lbs Muscle Mass (lbs): 115.8 lbs Total Body Water  (lbs): 84 lbs Visceral Fat Rating : 15   Other Clinical Data RMR: 1354 Fasting: yes Labs: yes Today's Visit #: 1 Starting Date: 10/20/23 Comments: First Visit    Chief Complaint: OBESITY   History of Present Illness Emily Hester is a 53 year old female with obesity and type 2 diabetes who presents for evaluation and management of her obesity.  She has been struggling with weight loss despite being on medications like Ozempic and Mounjaro. Her metabolism feels stagnant, and she has not experienced significant weight loss. She previously considered weight loss surgery but decided against it due to concerns about excess skin.  She has a history of type 2 diabetes diagnosed approximately five years ago. She is currently on Mounjaro and monitors her blood sugar levels at home, reporting morning levels under 100 and postprandial levels around 115. She feels low when her blood sugar is at 80, although this is not clinically low.  She also has hypertension, hyperlipidemia, and vitamin D  deficiency. She has joint pains and arthritis, which she hopes will improve with weight loss.  She has a nut allergy and lactose intolerance, which causes stomach cramps and gas if she consumes too much lactose. She uses lactose-free milk to manage symptoms.  She has a history of emotional eating, particularly when bored, but reports improvement in this behavior. She has a sweet tooth and  dislikes certain foods like oatmeal, mayo, peas, pork, and tomatoes. She skips breakfast due to lack of hunger and is not much of a snacker, though she enjoys grapes.  She has a history of feeling judged about her weight and eating habits growing up, which has affected her self-esteem. She is working on improving her self-image and relationship with food.  Testing: REE: 1354 BMR 1733 ECG NSR 1733 [place] 0      PHYSICAL EXAM:  Blood pressure 116/76, pulse 85, temperature 98.6 F (37 C), height 5' 4.5 (1.638 m), weight 237 lb (107.5 kg), SpO2 98%. Body mass index is 40.05 kg/m.  DIAGNOSTIC DATA REVIEWED:  BMET    Component Value Date/Time   NA 137 03/13/2023 0209   K 3.8 03/13/2023 0209   CL 106 03/13/2023 0209   CO2 22 03/13/2023 0209   GLUCOSE 126 (H) 03/13/2023 0209   BUN 7 03/13/2023 0209   CREATININE 0.82 03/13/2023 0209   CREATININE 0.81 04/16/2012 1055   CALCIUM  9.0 03/13/2023 0209   GFRNONAA >60 03/13/2023 0209   GFRAA >60 01/04/2018 1351   Lab Results  Component Value Date   HGBA1C 6.5 (H) 03/03/2017   No results found for: INSULIN  No results found for: TSH CBC    Component Value Date/Time   WBC 8.8 03/13/2023 0209   RBC 4.96 03/13/2023 0209   HGB 13.4 03/13/2023 0209   HGB 12.9 10/06/2020 1416   HCT 42.2 03/13/2023 0209   HCT 40.2 10/06/2020 1416   PLT 278 03/13/2023 0209   MCV 85.1 03/13/2023 0209   MCV 81 10/06/2020 1416  MCH 27.0 03/13/2023 0209   MCHC 31.8 03/13/2023 0209   RDW 16.3 (H) 03/13/2023 0209   RDW 16.0 (H) 10/06/2020 1416   Iron Studies No results found for: IRON, TIBC, FERRITIN, IRONPCTSAT Lipid Panel  No results found for: CHOL, TRIG, HDL, CHOLHDL, VLDL, LDLCALC, LDLDIRECT Hepatic Function Panel     Component Value Date/Time   PROT 7.2 03/13/2023 0209   ALBUMIN 3.7 03/13/2023 0209   AST 22 03/13/2023 0209   ALT 20 03/13/2023 0209   ALKPHOS 103 03/13/2023 0209   BILITOT 0.5 03/13/2023 0209    No results found for: TSH Nutritional No results found for: VD25OH   Assessment and Plan Assessment & Plan Fatigue Fatigue may be related to obesity, type 2 diabetes mellitus, or other underlying health issues. Lower than expected metabolism may contribute to fatigue. - Order labs to assess metabolic function and nutritional status - Continue diet, exercise and weight loss as discussed today as an important part of the treatment plan  Obesity Obesity is a chronic condition. She has been on Mounjaro after switching from Ozempic. Weight loss has been less than expected, likely due to diabetes and lower than expected metabolism. She is not interested in weight loss surgery due to concerns about excess skin and potential malnourishment from low-calorie intake post-surgery. The treatment plan focuses on dietary changes and improving metabolism to facilitate weight loss. - Initiate category two eating plan with specific dietary guidelines - Provide food scale for accurate portion measurement - Advise against frequent home weighing; remove scale batteries - Schedule follow-up in two weeks to assess progress and adjust plan as needed  Type 2 diabetes mellitus Type 2 diabetes mellitus diagnosed approximately five years ago. Currently managed with Mounjaro. Blood glucose levels are generally well-controlled with morning levels under 100 and postprandial levels around 115. She experiences symptoms of hypoglycemia at blood sugar levels of 80, which her body perceives as low. - Continue monitoring blood glucose levels at home - Order labs to assess glucose control and metabolic function - Continue diet, exercise and weight loss as discussed today as an important part of the treatment plan  Hypertension Hypertension is present. Management will be integrated with obesity and dietary interventions. - Continue diet, exercise and weight loss as discussed today as an important part of the treatment  plan  Hyperlipidemia Hyperlipidemia is present. Management will be integrated with obesity and dietary interventions. - Order labs to assess lipid profile - Continue diet, exercise and weight loss as discussed today as an important part of the treatment plan  exertional shortness of breath Exertional shortness of breath is present. Likely due to exercise intolerance. Will work on diet first, then slowly increase exercise as tolerated  Vitamin D  deficiency Vitamin D  deficiency is noted. Management will be integrated with dietary interventions. - Order labs to assess vitamin D  levels  Lactose intolerance Lactose intolerance is present, but she can tolerate small amounts of lactose. Symptoms include stomach cramps and gas. - Advise on low-lactose dietary options  Nut allergy Nut allergy is present. - Advise on avoiding nuts in dietary plan     I have personally spent 45 minutes total time today in preparation, patient care, and documentation for this visit, including the following: review of clinical lab tests; review of medical history, review of   She was informed of the importance of frequent follow up visits to maximize her success with intensive lifestyle modifications for her multiple health conditions.    Louann Penton, MD

## 2023-10-21 LAB — CMP14+EGFR
ALT: 16 IU/L (ref 0–32)
AST: 17 IU/L (ref 0–40)
Albumin: 4.7 g/dL (ref 3.8–4.9)
Alkaline Phosphatase: 143 IU/L — ABNORMAL HIGH (ref 44–121)
BUN/Creatinine Ratio: 16 (ref 9–23)
BUN: 12 mg/dL (ref 6–24)
Bilirubin Total: 0.5 mg/dL (ref 0.0–1.2)
CO2: 19 mmol/L — ABNORMAL LOW (ref 20–29)
Calcium: 9.7 mg/dL (ref 8.7–10.2)
Chloride: 103 mmol/L (ref 96–106)
Creatinine, Ser: 0.77 mg/dL (ref 0.57–1.00)
Globulin, Total: 2.9 g/dL (ref 1.5–4.5)
Glucose: 73 mg/dL (ref 70–99)
Potassium: 4.2 mmol/L (ref 3.5–5.2)
Sodium: 139 mmol/L (ref 134–144)
Total Protein: 7.6 g/dL (ref 6.0–8.5)
eGFR: 92 mL/min/1.73 (ref >59)

## 2023-10-21 LAB — HEMOGLOBIN A1C
Est. average glucose Bld gHb Est-mCnc: 117 mg/dL
Hgb A1c MFr Bld: 5.7 % — ABNORMAL HIGH (ref 4.8–5.6)

## 2023-10-21 LAB — LIPID PANEL WITH LDL/HDL RATIO
Cholesterol, Total: 157 mg/dL (ref 100–199)
HDL: 50 mg/dL (ref >39)
LDL Chol Calc (NIH): 83 mg/dL (ref 0–99)
LDL/HDL Ratio: 1.7 ratio (ref 0.0–3.2)
Triglycerides: 138 mg/dL (ref 0–149)
VLDL Cholesterol Cal: 24 mg/dL (ref 5–40)

## 2023-10-21 LAB — CBC WITH DIFFERENTIAL/PLATELET
Basophils Absolute: 0.1 x10E3/uL (ref 0.0–0.2)
Basos: 1 %
EOS (ABSOLUTE): 0.2 x10E3/uL (ref 0.0–0.4)
Eos: 2 %
Hematocrit: 42.8 % (ref 34.0–46.6)
Hemoglobin: 13.2 g/dL (ref 11.1–15.9)
Immature Grans (Abs): 0 x10E3/uL (ref 0.0–0.1)
Immature Granulocytes: 0 %
Lymphocytes Absolute: 3 x10E3/uL (ref 0.7–3.1)
Lymphs: 29 %
MCH: 26.2 pg — ABNORMAL LOW (ref 26.6–33.0)
MCHC: 30.8 g/dL — ABNORMAL LOW (ref 31.5–35.7)
MCV: 85 fL (ref 79–97)
Monocytes Absolute: 0.7 x10E3/uL (ref 0.1–0.9)
Monocytes: 6 %
Neutrophils Absolute: 6.3 x10E3/uL (ref 1.4–7.0)
Neutrophils: 62 %
Platelets: 291 x10E3/uL (ref 150–450)
RBC: 5.04 x10E6/uL (ref 3.77–5.28)
RDW: 14.7 % (ref 11.7–15.4)
WBC: 10.1 x10E3/uL (ref 3.4–10.8)

## 2023-10-21 LAB — VITAMIN D 25 HYDROXY (VIT D DEFICIENCY, FRACTURES): Vit D, 25-Hydroxy: 33.4 ng/mL (ref 30.0–100.0)

## 2023-10-21 LAB — MICROALBUMIN / CREATININE URINE RATIO
Creatinine, Urine: 51.6 mg/dL
Microalb/Creat Ratio: <6 mg/g{creat} (ref 0–29)
Microalbumin, Urine: <3 ug/mL

## 2023-10-21 LAB — TSH: TSH: 1.67 u[IU]/mL (ref 0.450–4.500)

## 2023-10-21 LAB — INSULIN, RANDOM: INSULIN: 23.8 u[IU]/mL (ref 2.6–24.9)

## 2023-10-21 LAB — FOLATE: Folate: 9.3 ng/mL (ref >3.0)

## 2023-10-21 LAB — VITAMIN B12: Vitamin B-12: 297 pg/mL (ref 232–1245)

## 2023-11-02 ENCOUNTER — Other Ambulatory Visit (HOSPITAL_BASED_OUTPATIENT_CLINIC_OR_DEPARTMENT_OTHER): Payer: Self-pay | Admitting: Orthopaedic Surgery

## 2023-11-02 DIAGNOSIS — G8929 Other chronic pain: Secondary | ICD-10-CM

## 2023-11-03 ENCOUNTER — Ambulatory Visit (INDEPENDENT_AMBULATORY_CARE_PROVIDER_SITE_OTHER): Admitting: Family Medicine

## 2023-11-03 ENCOUNTER — Encounter (INDEPENDENT_AMBULATORY_CARE_PROVIDER_SITE_OTHER): Payer: Self-pay | Admitting: Family Medicine

## 2023-11-03 VITALS — BP 101/69 | HR 88 | Temp 98.1°F | Ht 64.5 in | Wt 232.0 lb

## 2023-11-03 DIAGNOSIS — Z7985 Long-term (current) use of injectable non-insulin antidiabetic drugs: Secondary | ICD-10-CM

## 2023-11-03 DIAGNOSIS — E538 Deficiency of other specified B group vitamins: Secondary | ICD-10-CM | POA: Diagnosis not present

## 2023-11-03 DIAGNOSIS — E669 Obesity, unspecified: Secondary | ICD-10-CM | POA: Diagnosis not present

## 2023-11-03 DIAGNOSIS — E559 Vitamin D deficiency, unspecified: Secondary | ICD-10-CM

## 2023-11-03 DIAGNOSIS — E119 Type 2 diabetes mellitus without complications: Secondary | ICD-10-CM | POA: Diagnosis not present

## 2023-11-03 DIAGNOSIS — Z6841 Body Mass Index (BMI) 40.0 and over, adult: Secondary | ICD-10-CM

## 2023-11-03 DIAGNOSIS — Z6839 Body mass index (BMI) 39.0-39.9, adult: Secondary | ICD-10-CM

## 2023-11-03 DIAGNOSIS — E1169 Type 2 diabetes mellitus with other specified complication: Secondary | ICD-10-CM

## 2023-11-03 NOTE — Progress Notes (Signed)
 Office: [phone]  /  Fax: [phone]  WEIGHT SUMMARY AND BIOMETRICS  Anthropometric Measurements Height: 5' 4.5 (1.638 m) Weight: 232 lb (105.2 kg) BMI (Calculated): 39.22 Weight at Last Visit: 237 lb Weight Lost Since Last Visit: 4 lb Weight Gained Since Last Visit: 0 Starting Weight: 237 lb Total Weight Loss (lbs): 0 lb (0 kg) Peak Weight: 280 lb Waist Measurement : 51 inches   Body Composition  Body Fat %: 47.7 % Fat Mass (lbs): 110.8 lbs Muscle Mass (lbs): 115.4 lbs Total Body Water  (lbs): 83.6 lbs Visceral Fat Rating : 14   Other Clinical Data RMR: 1354 Fasting: no Labs: no Today's Visit #: 2 Starting Date: 10/20/23 Comments: cat 2    Chief Complaint: OBESITY    History of Present Illness Emily Hester is a 53 year old female with obesity who presents for a review of her obesity treatment plan and workup test results.  She has been adhering to a category two eating plan 95% of the time, although she finds the breakfast options limited and unappealing, describing them as 'boring' and restricted to 'eggs and toast'. Despite this, she has lost four pounds in the last two weeks.  She has not been engaging in any exercise as no regimen has been assigned yet. She perceives the amount of food on the plan as substantial, which she believes helps her body 'stop fighting' her.  Recent laboratory workup revealed low CO2 levels, attributed to rapid breathing during the blood draw. Kidney functions were normal, but alkaline phosphatase was slightly elevated. Cholesterol levels were excellent, with HDL, triglycerides, and LDL all within normal ranges. Vitamin D  levels were slightly low at 33. B12 levels were also low, but not to the point of causing anemia. Her A1c was slightly elevated at 5.7. Fasting glucose was normal at 73, but insulin  levels were high at 23.  She is currently on Mounjaro 12.5 mg for blood sugar management. Despite being on medication, she  reports not losing weight, which she finds frustrating. She has a family history of insulin  resistance.      PHYSICAL EXAM:  Blood pressure 101/69, pulse 88, temperature 98.1 F (36.7 C), height 5' 4.5 (1.638 m), weight 232 lb (105.2 kg), SpO2 97%. Body mass index is 39.21 kg/m.  DIAGNOSTIC DATA REVIEWED:  BMET    Component Value Date/Time   NA 139 10/20/2023 1014   K 4.2 10/20/2023 1014   CL 103 10/20/2023 1014   CO2 19 (L) 10/20/2023 1014   GLUCOSE 73 10/20/2023 1014   GLUCOSE 126 (H) 03/13/2023 0209   BUN 12 10/20/2023 1014   CREATININE 0.77 10/20/2023 1014   CREATININE 0.81 04/16/2012 1055   CALCIUM  9.7 10/20/2023 1014   GFRNONAA >60 03/13/2023 0209   GFRAA >60 01/04/2018 1351   Lab Results  Component Value Date   HGBA1C 5.7 (H) 10/20/2023   HGBA1C 6.5 (H) 03/03/2017   Lab Results  Component Value Date   INSULIN  23.8 10/20/2023   Lab Results  Component Value Date   TSH 1.670 10/20/2023   CBC    Component Value Date/Time   WBC 10.1 10/20/2023 1014   WBC 8.8 03/13/2023 0209   RBC 5.04 10/20/2023 1014   RBC 4.96 03/13/2023 0209   HGB 13.2 10/20/2023 1014   HCT 42.8 10/20/2023 1014   PLT 291 10/20/2023 1014   MCV 85 10/20/2023 1014   MCH 26.2 (L) 10/20/2023 1014   MCH 27.0 03/13/2023 0209   MCHC 30.8 (L) 10/20/2023 1014  MCHC 31.8 03/13/2023 0209   RDW 14.7 10/20/2023 1014   Iron Studies No results found for: IRON, TIBC, FERRITIN, IRONPCTSAT Lipid Panel     Component Value Date/Time   CHOL 157 10/20/2023 1014   TRIG 138 10/20/2023 1014   HDL 50 10/20/2023 1014   LDLCALC 83 10/20/2023 1014   Hepatic Function Panel     Component Value Date/Time   PROT 7.6 10/20/2023 1014   ALBUMIN 4.7 10/20/2023 1014   AST 17 10/20/2023 1014   ALT 16 10/20/2023 1014   ALKPHOS 143 (H) 10/20/2023 1014   BILITOT 0.5 10/20/2023 1014      Component Value Date/Time   TSH 1.670 10/20/2023 1014   Nutritional Lab Results  Component Value Date    VD25OH 33.4 10/20/2023     Assessment and Plan Assessment & Plan Obesity Following a category two eating plan with 95% adherence, resulting in a 4-pound weight loss over two weeks. The plan aims to stabilize blood sugar and reduce insulin  resistance by providing adequate nutrition, particularly protein. - Provide more breakfast options to increase variety and adherence to the eating plan. - Encourage consumption of the full amount of protein as per the plan. - Advise on creative ways to incorporate protein throughout the day, such as splitting protein intake between meals. - Recommend seafood consumption at least twice a week. - Provide tips for cooking without oil to reduce calorie intake.  Vitamin D  insufficiency Vitamin D  level is slightly low at 33, just above the threshold of 32, which puts her at risk for osteoporosis. Low vitamin D  can be due to genetic predisposition and skin pigmentation, affecting synthesis from sunlight. - Prescribe weekly vitamin D  supplement to be sent [facility].  Vitamin B12 insufficiency B12 levels are slightly below the desired level of 500, currently under 300. B12 is primarily found in meats and absorbed through the GI tract. The current eating plan is rich in B12. - Recheck B12 levels in three months.  Diabetes II A1c is 5.7, indicating well controlled diabetes, with well-controlled fasting glucose at 73. Elevated insulin  level at 23 indicates insulin  resistance, contributing to weight gain. Treating insulin  resistance will help prevent diabetes complications as well as help with weight loss. Currently on Mounjaro 12.5 mg. - Continue Mounjaro 12.5 mg. - Emphasize adherence to the eating plan to improve insulin  sensitivity. - Educate on the impact of insulin  resistance on weight and diabetes management.  Follow-Up A follow-up appointment is to be scheduled in 2-3 weeks.      I have personally spent 45 minutes total time today in preparation,  patient care, and documentation for this visit, including the following: review of clinical lab tests; review of medical history, review of disease pathophysiology and intensive nutritional counseling   She was informed of the importance of frequent follow up visits to maximize her success with intensive lifestyle modifications for her multiple health conditions.    Louann Penton, MD

## 2023-11-07 ENCOUNTER — Telehealth (INDEPENDENT_AMBULATORY_CARE_PROVIDER_SITE_OTHER): Payer: Self-pay | Admitting: Family Medicine

## 2023-11-07 ENCOUNTER — Other Ambulatory Visit (INDEPENDENT_AMBULATORY_CARE_PROVIDER_SITE_OTHER): Payer: Self-pay

## 2023-11-07 ENCOUNTER — Other Ambulatory Visit (INDEPENDENT_AMBULATORY_CARE_PROVIDER_SITE_OTHER): Payer: Self-pay | Admitting: Family Medicine

## 2023-11-07 DIAGNOSIS — E559 Vitamin D deficiency, unspecified: Secondary | ICD-10-CM

## 2023-11-07 MED ORDER — VITAMIN D 50 MCG (2000 UT) PO TABS: 2000.0000 [IU] | ORAL_TABLET | Freq: Every day | ORAL | 0 refills | Status: DC

## 2023-11-07 NOTE — Telephone Encounter (Signed)
 Patient called stating that her RX for Vitamin D  was not sent to her pharmacy. Pt states that she saw Dr. Verdon last week, and the Vitamin D  was not sent to [facility] [address] in [place].

## 2023-11-07 NOTE — Telephone Encounter (Signed)
 Done, thanks

## 2023-11-07 NOTE — Telephone Encounter (Signed)
 Which Recipes?

## 2023-11-07 NOTE — Telephone Encounter (Signed)
Vitamin D pended

## 2023-11-07 NOTE — Telephone Encounter (Signed)
No idea? 

## 2023-11-07 NOTE — Telephone Encounter (Signed)
 Patient also wants recipes for Category 2. Patient wants to come by and pick up some recipes.

## 2023-11-08 NOTE — Addendum Note (Signed)
 Addended by: DELORES CASTOR A on: 11/08/2023 06:58 AM   Modules accepted: Orders

## 2023-11-08 NOTE — Telephone Encounter (Signed)
 Please review

## 2023-11-21 ENCOUNTER — Encounter (INDEPENDENT_AMBULATORY_CARE_PROVIDER_SITE_OTHER): Payer: Self-pay | Admitting: Nurse Practitioner

## 2023-11-21 ENCOUNTER — Ambulatory Visit (INDEPENDENT_AMBULATORY_CARE_PROVIDER_SITE_OTHER): Admitting: Nurse Practitioner

## 2023-11-21 VITALS — BP 132/82 | HR 61 | Temp 98.4°F | Ht 64.5 in | Wt 230.0 lb

## 2023-11-21 DIAGNOSIS — E1159 Type 2 diabetes mellitus with other circulatory complications: Secondary | ICD-10-CM

## 2023-11-21 DIAGNOSIS — E785 Hyperlipidemia, unspecified: Secondary | ICD-10-CM

## 2023-11-21 DIAGNOSIS — Z7985 Long-term (current) use of injectable non-insulin antidiabetic drugs: Secondary | ICD-10-CM

## 2023-11-21 DIAGNOSIS — Z6838 Body mass index (BMI) 38.0-38.9, adult: Secondary | ICD-10-CM

## 2023-11-21 DIAGNOSIS — I152 Hypertension secondary to endocrine disorders: Secondary | ICD-10-CM

## 2023-11-21 DIAGNOSIS — E559 Vitamin D deficiency, unspecified: Secondary | ICD-10-CM

## 2023-11-21 DIAGNOSIS — E1169 Type 2 diabetes mellitus with other specified complication: Secondary | ICD-10-CM | POA: Diagnosis not present

## 2023-11-21 DIAGNOSIS — Z7984 Long term (current) use of oral hypoglycemic drugs: Secondary | ICD-10-CM

## 2023-11-21 DIAGNOSIS — E66812 Obesity, class 2: Secondary | ICD-10-CM

## 2023-11-21 MED ORDER — TIRZEPATIDE 15 MG/0.5ML ~~LOC~~ SOAJ: 15.0000 mg | SUBCUTANEOUS | 0 refills | Status: DC

## 2023-11-21 NOTE — Progress Notes (Signed)
 Office: [phone]  /  Fax: [phone]  WEIGHT SUMMARY AND BIOMETRICS  Weight Lost Since Last Visit: 2 lb  Weight Gained Since Last Visit: 0   Vitals Temp: 98.4 F (36.9 C) BP: 132/82 Pulse Rate: 61 SpO2: 99 %   Anthropometric Measurements Height: 5' 4.5 (1.638 m) Weight: 230 lb (104.3 kg) BMI (Calculated): 38.88 Weight at Last Visit: 232 lb Weight Lost Since Last Visit: 2 lb Weight Gained Since Last Visit: 0 Starting Weight: 237 lb Total Weight Loss (lbs): 7 lb (3.175 kg) Peak Weight: 280 lb   Body Composition  Body Fat %: 48 % Fat Mass (lbs): 110.4 lbs Muscle Mass (lbs): 113.8 lbs Total Body Water  (lbs): 82 lbs Visceral Fat Rating : 14   Other Clinical Data Fasting: No Labs: No Today's Visit #: 3 Starting Date: 10/20/23    Total Weight Loss: 7 pounds Percent of body weight lost: 2.9%   HPI  Chief Complaint: OBESITY  [name] is here to discuss her progress with her obesity treatment plan. She is on the the Category 2 Plan and states she is following her eating plan approximately 97 % of the time. She states she is exercising 60 minutes 2 days per week doing water  aerobics. She has also been adding more walking.  She is more active, doesn't need to ride the cart in grocery stores now.     Interval History:  Since last office visit she has started doing water  aerobics with her mom 2 days a week for 60 minutes.   She has a history of hypertension currently well controlled on amlodipine  10 mg every day, losartan  25 mg every day  BP Readings from Last 3 Encounters:  11/21/23 132/82  11/03/23 101/69  10/20/23 116/76   She started Mounjaro  in 05/2023 but has lost 20 pounds so far.  She is currently on Mounjaro  12.5 mg SQ qw and Metformin  XR 750 mg BID. Last A1c was improved at 5.7  She is currently on Rosuvastatin  10 mg every day for hyperlipidemia and last cholesterol 157, LDL 83, HDL 50  Pharmacotherapy for weight loss: She is currently taking  Mounjaro  for diabetes and off label for medical weight loss.  Denies side effects.           PHYSICAL EXAM:  Blood pressure 132/82, pulse 61, temperature 98.4 F (36.9 C), height 5' 4.5 (1.638 m), weight 230 lb (104.3 kg), SpO2 99%. Body mass index is 38.87 kg/m.  General: She is overweight, cooperative, alert, well developed, and in no acute distress. PSYCH: Has normal mood, affect and thought process.   Extremities: No edema.  Neurologic: No gross sensory or motor deficits. No tremors or fasciculations noted.    DIAGNOSTIC DATA REVIEWED:  BMET    Component Value Date/Time   NA 139 10/20/2023 1014   K 4.2 10/20/2023 1014   CL 103 10/20/2023 1014   CO2 19 (L) 10/20/2023 1014   GLUCOSE 73 10/20/2023 1014   GLUCOSE 126 (H) 03/13/2023 0209   BUN 12 10/20/2023 1014   CREATININE 0.77 10/20/2023 1014   CREATININE 0.81 04/16/2012 1055   CALCIUM  9.7 10/20/2023 1014   GFRNONAA >60 03/13/2023 0209   GFRAA >60 01/04/2018 1351   Lab Results  Component Value Date   HGBA1C 5.7 (H) 10/20/2023   HGBA1C 6.5 (H) 03/03/2017   Lab Results  Component Value Date   INSULIN  23.8 10/20/2023   Lab Results  Component Value Date   TSH 1.670 10/20/2023   CBC  Component Value Date/Time   WBC 10.1 10/20/2023 1014   WBC 8.8 03/13/2023 0209   RBC 5.04 10/20/2023 1014   RBC 4.96 03/13/2023 0209   HGB 13.2 10/20/2023 1014   HCT 42.8 10/20/2023 1014   PLT 291 10/20/2023 1014   MCV 85 10/20/2023 1014   MCH 26.2 (L) 10/20/2023 1014   MCH 27.0 03/13/2023 0209   MCHC 30.8 (L) 10/20/2023 1014   MCHC 31.8 03/13/2023 0209   RDW 14.7 10/20/2023 1014   Iron Studies No results found for: IRON, TIBC, FERRITIN, IRONPCTSAT Lipid Panel     Component Value Date/Time   CHOL 157 10/20/2023 1014   TRIG 138 10/20/2023 1014   HDL 50 10/20/2023 1014   LDLCALC 83 10/20/2023 1014   Hepatic Function Panel     Component Value Date/Time   PROT 7.6 10/20/2023 1014   ALBUMIN 4.7 10/20/2023  1014   AST 17 10/20/2023 1014   ALT 16 10/20/2023 1014   ALKPHOS 143 (H) 10/20/2023 1014   BILITOT 0.5 10/20/2023 1014      Component Value Date/Time   TSH 1.670 10/20/2023 1014   Nutritional Lab Results  Component Value Date   VD25OH 33.4 10/20/2023     ASSESSMENT AND PLAN  TREATMENT PLAN FOR OBESITY:  Recommended Dietary Goals  Emily Hester is currently in the action stage of change. As such, her goal is to continue weight management plan. She has agreed to the Category 2 Plan.  Behavioral Intervention  We discussed the following Behavioral Modification Strategies today: increasing lean protein intake to established goals, continue to work on maintaining a reduced calorie state, getting the recommended amount of protein, incorporating whole foods, making healthy choices, staying well hydrated and practicing mindfulness when eating., and increase protein intake, fibrous foods (25 grams per day for women, 30 grams for men) and water  to improve satiety and decrease hunger signals. .  Additional resources provided today: NA  Recommended Physical Activity Goals  Kenyon has been advised to work up to 150 minutes of moderate intensity aerobic activity a week and strengthening exercises 2-3 times per week for cardiovascular health, weight loss maintenance and preservation of muscle mass.   She has agreed to Increase physical activity in their day and reduce sedentary time (increase NEAT)., Start aerobic activity with a goal of 150 minutes a week at moderate intensity. , and Combine aerobic and strengthening exercises for efficiency and improved cardiometabolic health.   Pharmacotherapy We discussed various medication options to help Keli with her weight loss efforts and we both agreed to increase Mounjaro  to 15 mg SQ QW.  ASSOCIATED CONDITIONS ADDRESSED TODAY  Action/Plan  Type 2 diabetes mellitus with hyperlipidemia (HCC) Continue Metformin  XR 750 mg BID Increase Mounjaro  to 15  mg SQ QW Continue to follow category 2 meal plan -     Tirzepatide ; Inject 15 mg into the skin once a week.  Dispense: 6 mL; Refill: 0  Hypertension associated with type 2 diabetes mellitus (HCC)       Continue amlodipine  10 mg every day and  losartan  25 mg every day        Monitor BP and if consistently greater than 140/90 notify PCP  Vitamin D  deficiency       Continue ergocalciferol  50000 units once a week.   Class 2 obesity without serious comorbidity with body mass index (BMI) of 38.0 to 38.9 in adult, unspecified obesity type       Long discussion on weight loss of 1-2 pounds a  week and desire to lose fat and not muscle       Continue Category 2 meal plan       Counseled on use of CHATGPT and skinny taste for alternate meal suggestions.          Return in about 2 weeks (around 12/05/2023).SABRA She was informed of the importance of frequent follow up visits to maximize her success with intensive lifestyle modifications for her multiple health conditions.   ATTESTASTION STATEMENTS:  Reviewed by clinician on day of visit: allergies, medications, problem list, medical history, surgical history, family history, social history, and previous encounter notes.   I personally spent a total of 41 minutes in the care of the patient today including preparing to see the patient, getting/reviewing separately obtained history, performing a medically appropriate exam/evaluation, counseling and educating, placing orders, documenting clinical information in the EHR, independently interpreting results, and communicating results.   Bethzy Hauck ANP-C

## 2023-11-24 ENCOUNTER — Encounter (HOSPITAL_BASED_OUTPATIENT_CLINIC_OR_DEPARTMENT_OTHER): Payer: Self-pay | Admitting: Orthopaedic Surgery

## 2023-11-24 DIAGNOSIS — M7522 Bicipital tendinitis, left shoulder: Secondary | ICD-10-CM | POA: Diagnosis not present

## 2023-11-24 DIAGNOSIS — M19012 Primary osteoarthritis, left shoulder: Secondary | ICD-10-CM | POA: Diagnosis not present

## 2023-11-24 DIAGNOSIS — M7542 Impingement syndrome of left shoulder: Secondary | ICD-10-CM | POA: Diagnosis not present

## 2023-11-24 HISTORY — PX: OTHER SURGICAL HISTORY: SHX169

## 2023-11-24 NOTE — Progress Notes (Signed)
 Date of Surgery: 11/24/2023  INDICATIONS: [name] is a 53 y.o.-year-old female with left shoulder rotator cuff tendinitis.  The risk and benefits of the procedure were discussed in detail and documented in the pre-operative evaluation.   PREOPERATIVE DIAGNOSES: Left shoulder, biceps tendinitis, AC arthritis, and subacromial impingement.  POSTOPERATIVE DIAGNOSIS: Same.  PROCEDURE: Arthroscopic limited debridement - 70177 Arthroscopic distal clavicle excision - 70175 Arthroscopic subacromial decompression - 70173 Arthroscopic biceps tenodesis - 70171  SURGEON: Emily LITTIE Parker MD  ASSISTANT: Conley Funk, ATC  ANESTHESIA:  general  IV FLUIDS AND URINE: See anesthesia record.  ANTIBIOTICS: Ancef   ESTIMATED BLOOD LOSS: 10 mL.  IMPLANTS:  * No surgical log found *  DRAINS: None  CULTURES: None  COMPLICATIONS: none  PROCEDURE:    OPERATIVE FINDING: Exam under anesthesia:   Examination under anesthesia revealed forward elevation of 150 degrees.  With the arm at the side, there was 65 degrees of external rotation.  There is a 1+ anterior load shift and a 1+ posterior load shift.    Arthroscopic findings demonstrated: Articular space: Rotator interval normal Chondral surfaces: Normal Biceps: Redness, erythema Subscapularis: Intact Supraspinatus: Intact Infraspinatus: Intact    I identified the patient in the pre-operative holding area.  I marked the operative right shoulder with my initials. I reviewed the risks and benefits of the proposed surgical intervention and the patient wished to proceed.  Anesthesia was then performed with regional block.  The patient was transferred to the operative suite and placed in the beach chair position with all bony prominences padded.     SCDs were placed on bilateral lower extremity. Appropriate antibiotics was administered within 1 hour before incision.  Anesthesia was induced.  The operative extremity was then prepped and  draped in standard fashion. A time out was performed confirming the correct extremity, correct patient and correct procedure.   The arthroscope was introduced in the glenohumeral joint from a posterior portal.  An anterior portal was created.  The shoulder was examined and the above findings were noted.     With an arthroscopic shaver and a wand ablator, synovitis throughout the  shoulder was resected.  The arthroscopic shaver was used to excise torn portions of the labrum back to a stable margin. Specifically this was done for the anterior and superior labrum. The rotator interval was debrided using shaver and electrocautery.  At this time the biceps was tagged with a self passing device with a #2 nonabsorbable suture twice.  This was done through the anterior portal.  This was placed in a single 3.75 Genie anchor.   Through visualization from intra-articular, the footprint of the rotator cuff was debrided of soft tissues back to bleeding bone.  This was done with an arthroscopic shaver.     The rotator cuff was then approached through the subacromial space. Anterior, lateral, and posterior portals were used.  Bursectomy was performed with an arthroscopic shaver and ArthroCare wand.  The soft tissues and 1 mm of bone on the undersurface of the acromion and clavicle were resected with the arthroscopic shaver and wand.  At this point the shaver was introduced into the anterior portal and the distal aspect of the clavicle was excised to the width of the shaver.   The shoulder was irrigated.  The arthroscopic instruments were removed.  Wounds were closed with 3-0 nylon sutures.  A sterile dressing was applied with xeroform, 4x8s, abdominal pad, and tape. An Rosalita was placed and the upper extremity was placed in  a shoulder immobilizer.  The patient tolerated the procedure well and was taken to the recovery room in stable condition.  All counts were correct in the case. The patient tolerated the procedure  well and was taken to the recovery room in stable condition.    POSTOPERATIVE PLAN: She will be active range of motion as tolerated once her nerve block wears off.  I will plan to see her back in 2 weeks for suture removal.  She will begin therapy immediately postop.  She will follow the biceps tenodesis protocol  Emily LITTIE Parker, MD 2:29 PM

## 2023-11-25 ENCOUNTER — Other Ambulatory Visit (HOSPITAL_BASED_OUTPATIENT_CLINIC_OR_DEPARTMENT_OTHER): Payer: Self-pay | Admitting: Orthopaedic Surgery

## 2023-11-25 ENCOUNTER — Telehealth (HOSPITAL_BASED_OUTPATIENT_CLINIC_OR_DEPARTMENT_OTHER): Payer: Self-pay | Admitting: Orthopaedic Surgery

## 2023-11-25 ENCOUNTER — Other Ambulatory Visit (HOSPITAL_BASED_OUTPATIENT_CLINIC_OR_DEPARTMENT_OTHER): Payer: Self-pay

## 2023-11-25 MED ORDER — OXYCODONE HCL 5 MG PO TABS
5.0000 mg | ORAL_TABLET | ORAL | 0 refills | Status: DC | PRN
  Filled 2023-11-25: qty 15, 3d supply, fill #0

## 2023-11-25 NOTE — Telephone Encounter (Signed)
 Post op meds

## 2023-11-26 ENCOUNTER — Other Ambulatory Visit (HOSPITAL_BASED_OUTPATIENT_CLINIC_OR_DEPARTMENT_OTHER): Payer: Self-pay

## 2023-11-27 NOTE — Therapy (Signed)
 OUTPATIENT PHYSICAL THERAPY UPPER EXTREMITY EVALUATION   Patient Name: Emily Hester MRN: [id number] DOB:Dec 04, 1970, 53 y.o., female Today's Date: 11/28/2023  END OF SESSION:  PT End of Session - 11/28/23 1423     Visit Number 1    Number of Visits 17    Date for PT Re-Evaluation 01/23/24    PT Start Time 1015    PT Stop Time 1055    PT Time Calculation (min) 40 min    Activity Tolerance Patient tolerated treatment well    Behavior During Therapy WFL for tasks assessed/performed          Past Medical History:  Diagnosis Date   Arthritis    patient denies   Asthma    Back pain    Chest pain    Constipation    Diabetes mellitus without complication (HCC)    type    Food allergy    GERD (gastroesophageal reflux disease)    Heartburn    Hyperlipidemia    Hypertension    Interstitial cystitis    IUD (intrauterine device) in place placed 6 weeks ago   Joint pain    Lactose intolerance    Migraines    MIgraines- not current     OSA (obstructive sleep apnea)    Osteoarthritis    Palpitations    Pneumonia    SOB (shortness of breath)    Swallowing problem    Vitamin D  deficiency    Past Surgical History:  Procedure Laterality Date   ANTERIOR HIP REVISION Right 05/20/2020   Procedure: RIGHT TOTAL HIP REVISION- ploy exhange and hip ball exchange with bone graft;  Surgeon: Vernetta Lonni GRADE, MD;  Location: [facility];  Service: Orthopedics;  Laterality: Right;   DILATION AND EVACUATION  09/30/2011   Procedure: DILATATION AND EVACUATION;  Surgeon: Lynwood FORBES Curlene PONCE, MD;  Location: [facility];  Service: Gynecology;  Laterality: N/A;   left knee surgery      arthroscopy    TOTAL HIP ARTHROPLASTY Right 03/11/2017   Procedure: RIGHT TOTAL HIP ARTHROPLASTY ANTERIOR APPROACH;  Surgeon: Vernetta Lonni GRADE, MD;  Location: [facility];  Service: Orthopedics;  Laterality: Right;   Patient Active Problem List   Diagnosis Date Noted   BMI 40.0-44.9, adult (HCC) current 40.05  10/20/2023   Vitamin D  deficiency 10/20/2023   Other fatigue 10/20/2023   Lactose intolerance 10/20/2023   DOE (dyspnea on exertion) 10/20/2023   Nut allergy 10/20/2023   Herpes simplex type 1 infection 10/13/2022   Chronic interstitial cystitis 10/13/2022   Morbid obesity (HCC) 10/13/2022   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HCC) 05/20/2020   Status post revision of total hip 05/20/2020   Trochanteric bursitis, right hip 09/05/2019   Hyperlipidemia 08/31/2019   Diabetes mellitus (HCC) 08/31/2019   Arthritis 08/31/2019   Overactive bladder 08/31/2019   Hypertensive disorder 08/30/2018   Asthma 08/30/2018   Migraine 08/30/2018   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   Unilateral primary osteoarthritis, right hip 01/27/2017   IUD (intrauterine device) in place 01/13/2017    PCP: [name], MD   REFERRING PROVIDER: Genelle Standing, MD   REFERRING DIAG: [phone] (ICD-10-CM) - Chronic left shoulder pain   THERAPY DIAG:  Chronic left shoulder pain  Stiffness of left shoulder, not elsewhere classified  Muscle weakness (generalized)  Rationale for Evaluation and Treatment: Rehabilitation  ONSET DATE: L Shoulder scope 11/24/23 AROM prn- follow biceps tenodesis protocol  SUBJECTIVE:  SUBJECTIVE STATEMENT: Pt is s/p on the left shoulder arthroscopy due to 1 year of pain and restriction.  She enters clinic with all bandaging on from surgery and has a sling on.    Hand dominance: Right  PERTINENT HISTORY: L shoulder injection 06/15/23  PAIN:  Are you having pain? Yes: NPRS scale: 7/10 Pain location: anterior Pain description: stinging, burning Aggravating factors: motion Relieving factors: ice  PRECAUTIONS: None  RED FLAGS: None   WEIGHT BEARING RESTRICTIONS:  No  FALLS:  Has patient fallen in last 6 months? Unknown  LIVING ENVIRONMENT: Lives with: lives with their family Lives in: House/apartment Has following equipment at home: sling  OCCUPATION: Not working  PLOF: Independent  PATIENT GOALS: decrease pain  NEXT MD VISIT: 12/08/23  OBJECTIVE:  Note: Objective measures were completed at Evaluation unless otherwise noted.  DIAGNOSTIC FINDINGS:  MRI L Shoulder 05/19/23 IMPRESSION: 1. Mild supraspinatus tendinosis without a tear.  PATIENT SURVEYS :  PSFS: THE PATIENT SPECIFIC FUNCTIONAL SCALE  Place score of 0-10 (0 = unable to perform activity and 10 = able to perform activity at the same level as before injury or problem)  Activity Date: 11/28/23    Do hairstyle 0    2.Pick up heavy items 0    3.Sleep on arm  0    4. Lift arm up 0    Total Score 0      Total Score = Sum of activity scores/number of activities  Minimally Detectable Change: 3 points (for single activity); 2 points (for average score)  [facility] (nd). The Patient Specific Functional Scale . Retrieved from [email or web address]   COGNITION: Overall cognitive status: Within functional limits for tasks assessed     SENSATION: WFL  POSTURE: L shoulder higher, rounded shoulders moderate  UPPER EXTREMITY ROM:    AROM/PROM Right eval Left eval  Shoulder flexion 22/90   Shoulder extension 40/   Shoulder abduction 25/45   Shoulder adduction    Shoulder internal rotation Crest/30 at 45   Shoulder external rotation 15/20   Elbow flexion    Elbow extension    Wrist flexion    Wrist extension    Wrist ulnar deviation    Wrist radial deviation    Wrist pronation    Wrist supination    (Blank rows = not tested)  UPPER EXTREMITY MMT:  MMT Right eval Left eval  Shoulder flexion 2+   Shoulder extension 3   Shoulder abduction 2+   Shoulder adduction    Shoulder internal rotation 3    Shoulder external rotation 3   Middle trapezius    Lower trapezius    Elbow flexion    Elbow extension    Wrist flexion    Wrist extension    Wrist ulnar deviation    Wrist radial deviation    Wrist pronation    Wrist supination    Grip strength (lbs)    (Blank rows = not tested)  SHOULDER SPECIAL TESTS: No special tests due to post op status  JOINT MOBILITY TESTING:  Decreased throughout   PALPATION:  2+ tenderness at incisions and mid deltoid.  TREATMENT DATE:  11/28/23 Initial evaluation completed followed by instruction and trial of HEP for correct form and comprehension.   PATIENT EDUCATION: Education details: HEP Person educated: Patient Education method: Programmer, multimedia, Demonstration, Actor cues, and Verbal cues Education comprehension: verbalized understanding  HOME EXERCISE PROGRAM: Access Code: [email or web address] URL: [email or web address] Date: 11/28/2023 Prepared by: Burnard Meth  Exercises - Seated Scapular Retraction  - 2 x daily - 7 x weekly - 2 sets - 10 reps - Seated Shoulder Shrugs  - 2 x daily - 7 x weekly - 2 sets - 10 reps - Supine Shoulder Flexion Extension AAROM with Dowel  - 2 x daily - 7 x weekly - 2 sets - 10 reps  ASSESSMENT:  CLINICAL IMPRESSION: Patient is a 53 y.o. female who was seen today for physical therapy evaluation and treatment for L shoulder s/p . She presents with decreased ROM, decreased strength, impaired ADL ability and pain.  She would benefit from skilled PT to address these deficits and return to previous pain free LOA.   OBJECTIVE IMPAIRMENTS: decreased ROM, decreased strength, hypomobility, impaired UE functional use, and pain.   ACTIVITY LIMITATIONS: carrying, lifting, sleeping, bathing, dressing, self feeding, and reach over head  PARTICIPATION LIMITATIONS: meal prep, cleaning, laundry,  driving, and community activity  PERSONAL FACTORS: None- are also affecting patient's functional outcome.   REHAB POTENTIAL: Good  CLINICAL DECISION MAKING: Stable/uncomplicated  EVALUATION COMPLEXITY: Moderate  GOALS: Goals reviewed with patient? Yes  SHORT TERM GOALS: Target date: 12/19/2023    Pt to be independent with HEP. Baseline: Goal status: INITIAL  2.  Decreased pain by 1 level. Baseline:  Goal status: INITIAL   LONG TERM GOALS: Target date: 01/23/2024    Patient to be independent in self progressive HEP by discharge. Baseline:  Goal status: INITIAL  2.  Increase active range of motion to WNL throughout. Baseline:  Goal status: INITIAL  3.  Increase strength to at least 4/5 throughout shoulder. Baseline: 2+ > 3 Goal status: INITIAL  4.  Reduce pain with all activities to 2 out of 10 or less. Baseline:  Goal status: INITIAL  5.  Improve PSFS score by at least 2 full points for a measurable improvement. Baseline: 0 Goal status: INITIAL  PLAN: PT FREQUENCY: 2x/week  PT DURATION: 8 weeks  PLANNED INTERVENTIONS: 97164- PT Re-evaluation, 97110-Therapeutic exercises, 97530- Therapeutic activity, [id number]- Neuromuscular re-education, 97535- Self Care, 02859- Manual therapy, [id number]- Aquatic Therapy, H9716- Electrical stimulation (unattended), 97016- Vasopneumatic device, [id number]- Ultrasound, [id number]- Traction (mechanical), [id number]- Ionotophoresis 4mg /ml Dexamethasone , Patient/Family education, Joint mobilization, Scar mobilization, Cryotherapy, and Moist heat  PLAN FOR NEXT SESSION: Start on pulleys and review HEP.   Burnard CHRISTELLA Meth, PT 11/28/2023, 2:26 PM

## 2023-11-28 ENCOUNTER — Other Ambulatory Visit: Payer: Self-pay

## 2023-11-28 ENCOUNTER — Ambulatory Visit (INDEPENDENT_AMBULATORY_CARE_PROVIDER_SITE_OTHER)

## 2023-11-28 DIAGNOSIS — M6281 Muscle weakness (generalized): Secondary | ICD-10-CM

## 2023-11-28 DIAGNOSIS — G8929 Other chronic pain: Secondary | ICD-10-CM | POA: Diagnosis not present

## 2023-11-28 DIAGNOSIS — M25612 Stiffness of left shoulder, not elsewhere classified: Secondary | ICD-10-CM | POA: Diagnosis not present

## 2023-11-28 DIAGNOSIS — M25512 Pain in left shoulder: Secondary | ICD-10-CM | POA: Diagnosis not present

## 2023-11-29 ENCOUNTER — Encounter (HOSPITAL_BASED_OUTPATIENT_CLINIC_OR_DEPARTMENT_OTHER): Payer: Self-pay | Admitting: Orthopaedic Surgery

## 2023-11-30 ENCOUNTER — Encounter: Payer: Self-pay | Admitting: Rehabilitative and Restorative Service Providers"

## 2023-11-30 ENCOUNTER — Ambulatory Visit (INDEPENDENT_AMBULATORY_CARE_PROVIDER_SITE_OTHER): Admitting: Rehabilitative and Restorative Service Providers"

## 2023-11-30 DIAGNOSIS — M25612 Stiffness of left shoulder, not elsewhere classified: Secondary | ICD-10-CM | POA: Diagnosis not present

## 2023-11-30 DIAGNOSIS — M6281 Muscle weakness (generalized): Secondary | ICD-10-CM

## 2023-11-30 DIAGNOSIS — G8929 Other chronic pain: Secondary | ICD-10-CM

## 2023-11-30 DIAGNOSIS — M25512 Pain in left shoulder: Secondary | ICD-10-CM

## 2023-11-30 NOTE — Therapy (Signed)
 OUTPATIENT PHYSICAL THERAPY TREATMENT   Patient Name: Emily Hester MRN: [id number] DOB:06-Feb-1971, 53 y.o., female Today's Date: 11/30/2023  END OF SESSION:  PT End of Session - 11/30/23 1606     Visit Number 2    Number of Visits 17    Date for PT Re-Evaluation 01/23/24    Authorization Type [employer]    PT Start Time 1603    PT Stop Time 1642    PT Time Calculation (min) 39 min    Activity Tolerance Patient limited by pain    Behavior During Therapy WFL for tasks assessed/performed           Past Medical History:  Diagnosis Date   Arthritis    patient denies   Asthma    Back pain    Chest pain    Constipation    Diabetes mellitus without complication (HCC)    type    Food allergy    GERD (gastroesophageal reflux disease)    Heartburn    Hyperlipidemia    Hypertension    Interstitial cystitis    IUD (intrauterine device) in place placed 6 weeks ago   Joint pain    Lactose intolerance    Migraines    MIgraines- not current     OSA (obstructive sleep apnea)    Osteoarthritis    Palpitations    Pneumonia    SOB (shortness of breath)    Swallowing problem    Vitamin D  deficiency    Past Surgical History:  Procedure Laterality Date   ANTERIOR HIP REVISION Right 05/20/2020   Procedure: RIGHT TOTAL HIP REVISION- ploy exhange and hip ball exchange with bone graft;  Surgeon: Vernetta Lonni GRADE, MD;  Location: [facility];  Service: Orthopedics;  Laterality: Right;   DILATION AND EVACUATION  09/30/2011   Procedure: DILATATION AND EVACUATION;  Surgeon: Lynwood FORBES Curlene PONCE, MD;  Location: [facility];  Service: Gynecology;  Laterality: N/A;   left knee surgery      arthroscopy    TOTAL HIP ARTHROPLASTY Right 03/11/2017   Procedure: RIGHT TOTAL HIP ARTHROPLASTY ANTERIOR APPROACH;  Surgeon: Vernetta Lonni GRADE, MD;  Location: [facility];  Service: Orthopedics;  Laterality: Right;   Patient Active Problem List   Diagnosis Date Noted   BMI 40.0-44.9, adult (HCC) current  40.05 10/20/2023   Vitamin D  deficiency 10/20/2023   Other fatigue 10/20/2023   Lactose intolerance 10/20/2023   DOE (dyspnea on exertion) 10/20/2023   Nut allergy 10/20/2023   Herpes simplex type 1 infection 10/13/2022   Chronic interstitial cystitis 10/13/2022   Morbid obesity (HCC) 10/13/2022   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HCC) 05/20/2020   Status post revision of total hip 05/20/2020   Trochanteric bursitis, right hip 09/05/2019   Hyperlipidemia 08/31/2019   Diabetes mellitus (HCC) 08/31/2019   Arthritis 08/31/2019   Overactive bladder 08/31/2019   Hypertensive disorder 08/30/2018   Asthma 08/30/2018   Migraine 08/30/2018   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   Unilateral primary osteoarthritis, right hip 01/27/2017   IUD (intrauterine device) in place 01/13/2017    PCP: [name], MD   REFERRING PROVIDER: Genelle Standing, MD   REFERRING DIAG: [phone] (ICD-10-CM) - Chronic left shoulder pain   THERAPY DIAG:  Chronic left shoulder pain  Stiffness of left shoulder, not elsewhere classified  Muscle weakness (generalized)  Rationale for Evaluation and Treatment: Rehabilitation  ONSET DATE:11/24/2023 surgery   SUBJECTIVE:  SUBJECTIVE STATEMENT: Pt indicated at rest uncomfortable. Pt indicated doing ok sleeping.   Hand dominance: Right  PERTINENT HISTORY: L shoulder injection 06/15/23  PAIN:   NPRS scale: 6/10 Pain location: anterior Pain description: stinging, burning Aggravating factors: motion Relieving factors: ice  PRECAUTIONS: Lt Shoulder scope 11/24/23 AROM prn- follow biceps tenodesis protocol  RED FLAGS: None   WEIGHT BEARING RESTRICTIONS: No  FALLS:  Has patient fallen in last 6 months? Unknown  LIVING ENVIRONMENT: Lives with:  lives with their family Lives in: House/apartment Has following equipment at home: sling  OCCUPATION: Not working  PLOF: Independent  PATIENT GOALS: decrease pain  NEXT MD VISIT: 12/08/23  OBJECTIVE:  Note: Objective measures were completed at Evaluation unless otherwise noted.  DIAGNOSTIC FINDINGS:  MRI L Shoulder 05/19/23 IMPRESSION: 1. Mild supraspinatus tendinosis without a tear.  PATIENT SURVEYS :  PSFS: THE PATIENT SPECIFIC FUNCTIONAL SCALE  Place score of 0-10 (0 = unable to perform activity and 10 = able to perform activity at the same level as before injury or problem)  Activity Date: 11/28/23    Do hairstyle 0    2.Pick up heavy items 0    3.Sleep on arm  0    4. Lift arm up 0    Total Score 0      Total Score = Sum of activity scores/number of activities  Minimally Detectable Change: 3 points (for single activity); 2 points (for average score)  [facility] (nd). The Patient Specific Functional Scale . Retrieved from [email or web address]   COGNITION: Eval Overall cognitive status: Within functional limits for tasks assessed     SENSATION: Eval: WFL  POSTURE: Eval L shoulder higher, rounded shoulders moderate  UPPER EXTREMITY ROM:    AROM/PROM Left eval Left 9/172025 Supine   Shoulder flexion 22/90 115 PROM  Shoulder extension 40/   Shoulder abduction 25/45 90 PROM  Shoulder adduction    Shoulder internal rotation Crest/30 at 45   Shoulder external rotation 15/20 30 PROM in 30 deg abduction  Elbow flexion    Elbow extension    Wrist flexion    Wrist extension    Wrist ulnar deviation    Wrist radial deviation    Wrist pronation    Wrist supination    (Blank rows = not tested)  UPPER EXTREMITY MMT:  MMT Left eval   Shoulder flexion 2+   Shoulder extension 3   Shoulder abduction 2+   Shoulder adduction    Shoulder internal rotation 3   Shoulder external rotation 3    Middle trapezius    Lower trapezius    Elbow flexion    Elbow extension    Wrist flexion    Wrist extension    Wrist ulnar deviation    Wrist radial deviation    Wrist pronation    Wrist supination    Grip strength (lbs)    (Blank rows = not tested)  SHOULDER SPECIAL TESTS: Eval No special tests due to post op status  JOINT MOBILITY TESTING:  Eval Decreased throughout   PALPATION:  Eval 2+ tenderness at incisions and mid deltoid.  TREATMENT        DATE: 11/30/2023 Therex: Supine Wand AAROM flexion 2-3 sec hold 2 x 10  Supine wand AAROM protraction in 90 deg flexion 3 sec hold x 10  Supine wand Passive ER stretch with arm at side and towel under arm 2-3 sec hold x 10  UBE fwd/back 3 mins each way AAROM lvl 1.5 with 1 min rest between directions. Seated scapular retraction 5 sec hold x 10 Verbal cues for pendulums and grip squeeze at home.    Manual Supine Lt shoulder inferior glides g2 in flexion, scaption and abduction, Distraction with PROM at tolerance.  Posterior glide Lt GH joint with passive ER to tolerance.        TREATMENT        DATE: 11/28/23 Initial evaluation completed followed by instruction and trial of HEP for correct form and comprehension.   PATIENT EDUCATION: Education details: HEP Person educated: Patient Education method: Programmer, multimedia, Demonstration, Actor cues, and Verbal cues Education comprehension: verbalized understanding  HOME EXERCISE PROGRAM: Access Code: [email or web address] URL: [email or web address] Date: 11/28/2023 Prepared by: Burnard Meth  Exercises - Seated Scapular Retraction  - 2 x daily - 7 x weekly - 2 sets - 10 reps - Seated Shoulder Shrugs  - 2 x daily - 7 x weekly - 2 sets - 10 reps - Supine Shoulder Flexion Extension AAROM with Dowel  - 2 x daily - 7 x weekly - 2 sets - 10  reps  ASSESSMENT:  CLINICAL IMPRESSION: Most Lt shoulder passive range limited due to pain/guarding with no capsular tightness noted in mid range passive accessory motion check.  Muscle guarding/spasm ing was noted very much so in movements.  Continued skilled PT services indicated at this time.     OBJECTIVE IMPAIRMENTS: decreased ROM, decreased strength, hypomobility, impaired UE functional use, and pain.   ACTIVITY LIMITATIONS: carrying, lifting, sleeping, bathing, dressing, self feeding, and reach over head  PARTICIPATION LIMITATIONS: meal prep, cleaning, laundry, driving, and community activity  PERSONAL FACTORS: None- are also affecting patient's functional outcome.   REHAB POTENTIAL: Good  CLINICAL DECISION MAKING: Stable/uncomplicated  EVALUATION COMPLEXITY: Moderate  GOALS: Goals reviewed with patient? Yes  SHORT TERM GOALS: Target date: 12/19/2023    Pt to be independent with HEP. Baseline: Goal status: on going 11/30/2023  2.  Decreased pain by 1 level. Baseline:  Goal status: on going 11/30/2023   LONG TERM GOALS: Target date: 01/23/2024    Patient to be independent in self progressive HEP by discharge. Baseline:  Goal status: INITIAL  2.  Increase active range of motion to WNL throughout. Baseline:  Goal status: INITIAL  3.  Increase strength to at least 4/5 throughout shoulder. Baseline: 2+ > 3 Goal status: INITIAL  4.  Reduce pain with all activities to 2 out of 10 or less. Baseline:  Goal status: INITIAL  5.  Improve PSFS score by at least 2 full points for a measurable improvement. Baseline: 0 Goal status: INITIAL  PLAN: PT FREQUENCY: 2x/week  PT DURATION: 8 weeks  PLANNED INTERVENTIONS: 97164- PT Re-evaluation, 97110-Therapeutic exercises, 97530- Therapeutic activity, [id number]- Neuromuscular re-education, 97535- Self Care, 02859- Manual therapy, [id number]- Aquatic Therapy, H9716- Electrical stimulation (unattended), 97016- Vasopneumatic device,  [id number]- Ultrasound, [id number]- Traction (mechanical), [id number]- Ionotophoresis 4mg /ml Dexamethasone , Patient/Family education, Joint mobilization, Scar mobilization, Cryotherapy, and Moist heat  PLAN FOR NEXT SESSION: AAROM, AROM progression, no strengthening or biceps loading per protocol.    Ozell Silvan, PT, DPT, OCS, ATC 11/30/23  4:42 PM

## 2023-12-02 ENCOUNTER — Other Ambulatory Visit (HOSPITAL_BASED_OUTPATIENT_CLINIC_OR_DEPARTMENT_OTHER): Payer: Self-pay | Admitting: Student

## 2023-12-02 ENCOUNTER — Other Ambulatory Visit (HOSPITAL_BASED_OUTPATIENT_CLINIC_OR_DEPARTMENT_OTHER): Payer: Self-pay | Admitting: Orthopaedic Surgery

## 2023-12-02 ENCOUNTER — Encounter (HOSPITAL_BASED_OUTPATIENT_CLINIC_OR_DEPARTMENT_OTHER): Payer: Self-pay | Admitting: Orthopaedic Surgery

## 2023-12-02 ENCOUNTER — Other Ambulatory Visit (HOSPITAL_BASED_OUTPATIENT_CLINIC_OR_DEPARTMENT_OTHER): Payer: Self-pay

## 2023-12-02 MED ORDER — ACETAMINOPHEN 500 MG PO TABS: 1000.0000 mg | ORAL_TABLET | Freq: Four times a day (QID) | ORAL | 0 refills | Status: DC | PRN

## 2023-12-02 MED ORDER — OXYCODONE HCL 5 MG PO TABS: 5.0000 mg | ORAL_TABLET | ORAL | 0 refills | Status: DC | PRN

## 2023-12-02 MED ORDER — ACETAMINOPHEN 500 MG PO TABS
500.0000 mg | ORAL_TABLET | Freq: Four times a day (QID) | ORAL | 0 refills | Status: AC | PRN
  Filled 2023-12-02: qty 30, 8d supply, fill #0

## 2023-12-02 MED ORDER — IBUPROFEN 800 MG PO TABS: 800.0000 mg | ORAL_TABLET | Freq: Three times a day (TID) | ORAL | 0 refills | Status: AC | PRN

## 2023-12-02 NOTE — Telephone Encounter (Signed)
 LMOM informing her about the refill and which pharmacy it is going to and that per clinic policy that this is the only refill.

## 2023-12-05 ENCOUNTER — Encounter: Payer: Self-pay | Admitting: Physical Therapy

## 2023-12-05 ENCOUNTER — Ambulatory Visit (INDEPENDENT_AMBULATORY_CARE_PROVIDER_SITE_OTHER): Admitting: Physical Therapy

## 2023-12-05 DIAGNOSIS — M6281 Muscle weakness (generalized): Secondary | ICD-10-CM | POA: Diagnosis not present

## 2023-12-05 DIAGNOSIS — G8929 Other chronic pain: Secondary | ICD-10-CM

## 2023-12-05 DIAGNOSIS — M25612 Stiffness of left shoulder, not elsewhere classified: Secondary | ICD-10-CM | POA: Diagnosis not present

## 2023-12-05 DIAGNOSIS — M25512 Pain in left shoulder: Secondary | ICD-10-CM | POA: Diagnosis not present

## 2023-12-05 NOTE — Therapy (Signed)
 OUTPATIENT PHYSICAL THERAPY TREATMENT   Patient Name: Emily Hester MRN: [id number] DOB:1970-04-03, 53 y.o., female Today's Date: 12/05/2023  END OF SESSION:  PT End of Session - 12/05/23 1304     Visit Number 3    Number of Visits 17    Date for Recertification  01/23/24    Authorization Type [employer]    PT Start Time 1302    PT Stop Time 1355    PT Time Calculation (min) 53 min    Activity Tolerance Patient limited by pain    Behavior During Therapy WFL for tasks assessed/performed            Past Medical History:  Diagnosis Date   Arthritis    patient denies   Asthma    Back pain    Chest pain    Constipation    Diabetes mellitus without complication (HCC)    type    Food allergy    GERD (gastroesophageal reflux disease)    Heartburn    Hyperlipidemia    Hypertension    Interstitial cystitis    IUD (intrauterine device) in place placed 6 weeks ago   Joint pain    Lactose intolerance    Migraines    MIgraines- not current     OSA (obstructive sleep apnea)    Osteoarthritis    Palpitations    Pneumonia    SOB (shortness of breath)    Swallowing problem    Vitamin D  deficiency    Past Surgical History:  Procedure Laterality Date   ANTERIOR HIP REVISION Right 05/20/2020   Procedure: RIGHT TOTAL HIP REVISION- ploy exhange and hip ball exchange with bone graft;  Surgeon: Vernetta Lonni GRADE, MD;  Location: [facility];  Service: Orthopedics;  Laterality: Right;   DILATION AND EVACUATION  09/30/2011   Procedure: DILATATION AND EVACUATION;  Surgeon: Lynwood FORBES Curlene PONCE, MD;  Location: [facility];  Service: Gynecology;  Laterality: N/A;   left knee surgery      arthroscopy    TOTAL HIP ARTHROPLASTY Right 03/11/2017   Procedure: RIGHT TOTAL HIP ARTHROPLASTY ANTERIOR APPROACH;  Surgeon: Vernetta Lonni GRADE, MD;  Location: [facility];  Service: Orthopedics;  Laterality: Right;   Patient Active Problem List   Diagnosis Date Noted   BMI 40.0-44.9, adult (HCC) current  40.05 10/20/2023   Vitamin D  deficiency 10/20/2023   Other fatigue 10/20/2023   Lactose intolerance 10/20/2023   DOE (dyspnea on exertion) 10/20/2023   Nut allergy 10/20/2023   Herpes simplex type 1 infection 10/13/2022   Chronic interstitial cystitis 10/13/2022   Morbid obesity (HCC) 10/13/2022   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HCC) 05/20/2020   Status post revision of total hip 05/20/2020   Trochanteric bursitis, right hip 09/05/2019   Hyperlipidemia 08/31/2019   Diabetes mellitus (HCC) 08/31/2019   Arthritis 08/31/2019   Overactive bladder 08/31/2019   Hypertensive disorder 08/30/2018   Asthma 08/30/2018   Migraine 08/30/2018   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   Unilateral primary osteoarthritis, right hip 01/27/2017   IUD (intrauterine device) in place 01/13/2017    PCP: [name], MD  REFERRING PROVIDER: Genelle Standing, MD  REFERRING DIAG: [phone] (ICD-10-CM) - Chronic left shoulder pain   THERAPY DIAG:  Chronic left shoulder pain  Stiffness of left shoulder, not elsewhere classified  Muscle weakness (generalized)  Rationale for Evaluation and Treatment: Rehabilitation  ONSET DATE:   11/24/2023 surgery   SUBJECTIVE:  SUBJECTIVE STATEMENT: She did the exercises. She is having surgery pain. Hand dominance: Right  PERTINENT HISTORY: L shoulder injection 06/15/23  PAIN:  NPRS scale:  since PT - at rest 4/10-6/10, standing & walking 5/10-8/10,  exercises 8/10 Pain location: left shoulder anterior Pain description: stinging, burning Aggravating factors: motion Relieving factors: ice  PRECAUTIONS: Lt Shoulder scope 11/24/23 AROM prn- follow biceps tenodesis protocol  RED FLAGS: None   WEIGHT BEARING RESTRICTIONS: No  FALLS:  Has patient  fallen in last 6 months? Unknown  LIVING ENVIRONMENT: Lives with: lives with their family Lives in: House/apartment Has following equipment at home: sling  OCCUPATION: Not working  PLOF: Independent  PATIENT GOALS: decrease pain  NEXT MD VISIT: 12/08/23  OBJECTIVE:  Note: Objective measures were completed at Evaluation unless otherwise noted.  DIAGNOSTIC FINDINGS:  MRI L Shoulder 05/19/23 IMPRESSION: 1. Mild supraspinatus tendinosis without a tear.  PATIENT SURVEYS :  PSFS: THE PATIENT SPECIFIC FUNCTIONAL SCALE  Place score of 0-10 (0 = unable to perform activity and 10 = able to perform activity at the same level as before injury or problem)  Activity Date: 11/28/23    Do hairstyle 0    2.Pick up heavy items 0    3.Sleep on arm  0    4. Lift arm up 0    Total Score 0      Total Score = Sum of activity scores/number of activities  Minimally Detectable Change: 3 points (for single activity); 2 points (for average score)  [facility] (nd). The Patient Specific Functional Scale . Retrieved from [email or web address]   COGNITION: Eval   Overall cognitive status: Within functional limits for tasks assessed     SENSATION: Eval: WFL  POSTURE: Eval L shoulder higher, rounded shoulders moderate  UPPER EXTREMITY ROM:    AROM/PROM Left eval Left 9/172025 Supine   Shoulder flexion 22/90 115 PROM  Shoulder extension 40/   Shoulder abduction 25/45 90 PROM  Shoulder adduction    Shoulder internal rotation Crest/30 at 45   Shoulder external rotation 15/20 30 PROM in 30 deg abduction  Elbow flexion    Elbow extension    Wrist flexion    Wrist extension    Wrist ulnar deviation    Wrist radial deviation    Wrist pronation    Wrist supination    (Blank rows = not tested)  UPPER EXTREMITY MMT:  MMT Left eval   Shoulder flexion 2+   Shoulder extension 3   Shoulder abduction 2+   Shoulder  adduction    Shoulder internal rotation 3   Shoulder external rotation 3   Middle trapezius    Lower trapezius    Elbow flexion    Elbow extension    Wrist flexion    Wrist extension    Wrist ulnar deviation    Wrist radial deviation    Wrist pronation    Wrist supination    Grip strength (lbs)    (Blank rows = not tested)  SHOULDER SPECIAL TESTS: Eval No special tests due to post op status  JOINT MOBILITY TESTING:  Eval Decreased throughout   PALPATION:  Eval 2+ tenderness at incisions and mid deltoid.  TREATMENT        DATE: 12/05/2023 Therapeutic Exercise: supine wand chest press with end range Serratus punch 10 reps 2 sets.  Supine wand flexion with end range deep breath stretch. 10 reps 2 sets.  Standing pendulum LUE start & end small motion and progress to max tolerable 10 reps for flex/ext, abd/add and circles CW/CCW.  Pt demo understanding and reports decrease shoulder pain.  Passive range of motion for left shoulder flexion, internal and external rotation to tolerance.  Self-care: PT demo & verbal cues on positioning seated, standing and bed (supine & right side lying) so weight of arm is supported.  Pt verbalized understanding.  PT recommended wearing arm sling when going out in public as visual sign to protect her arm.  Pt verbalized understanding.     Vaso seated with left upper extremity supported medium compression 34*for 10 minutes into session for edema control  TREATMENT        DATE: 11/30/2023 Therex: Supine Wand AAROM flexion 2-3 sec hold 2 x 10  Supine wand AAROM protraction in 90 deg flexion 3 sec hold x 10  Supine wand Passive ER stretch with arm at side and towel under arm 2-3 sec hold x 10  UBE fwd/back 3 mins each way AAROM lvl 1.5 with 1 min rest between directions. Seated scapular retraction 5 sec hold x  10 Verbal cues for pendulums and grip squeeze at home.    Manual Supine Lt shoulder inferior glides g2 in flexion, scaption and abduction, Distraction with PROM at tolerance.  Posterior glide Lt GH joint with passive ER to tolerance.        TREATMENT        DATE: 11/28/23 Initial evaluation completed followed by instruction and trial of HEP for correct form and comprehension.   PATIENT EDUCATION: Education details: HEP Person educated: Patient Education method: Programmer, multimedia, Demonstration, Actor cues, and Verbal cues Education comprehension: verbalized understanding  HOME EXERCISE PROGRAM: Access Code: [email or web address] URL: [email or web address] Date: 11/28/2023 Prepared by: Burnard Meth  Exercises - Seated Scapular Retraction  - 2 x daily - 7 x weekly - 2 sets - 10 reps - Seated Shoulder Shrugs  - 2 x daily - 7 x weekly - 2 sets - 10 reps - Supine Shoulder Flexion Extension AAROM with Dowel  - 2 x daily - 7 x weekly - 2 sets - 10 reps    ASSESSMENT:  CLINICAL IMPRESSION: Patient appears to understand PT recommendations for positioning which should decrease some of her pain.  She is tolerating initial shoulder exercises.   Continued skilled PT services indicated at this time.     OBJECTIVE IMPAIRMENTS: decreased ROM, decreased strength, hypomobility, impaired UE functional use, and pain.   ACTIVITY LIMITATIONS: carrying, lifting, sleeping, bathing, dressing, self feeding, and reach over head  PARTICIPATION LIMITATIONS: meal prep, cleaning, laundry, driving, and community activity  PERSONAL FACTORS: None- are also affecting patient's functional outcome.   REHAB POTENTIAL: Good  CLINICAL DECISION MAKING: Stable/uncomplicated  EVALUATION COMPLEXITY: Moderate  GOALS: Goals reviewed with patient? Yes  SHORT TERM GOALS: Target date: 12/19/2023    Pt to be independent with HEP. Baseline: Goal status: Ongoing   12/05/2023  2.  Decreased pain by 1  level. Baseline:  Goal status: Ongoing   12/05/2023   LONG TERM GOALS: Target date: 01/23/2024    Patient to be independent in self progressive HEP by discharge. Baseline:  Goal status: Ongoing   12/05/2023  2.  Increase active range of motion to  WNL throughout. Baseline:  Goal status: Ongoing   12/05/2023  3.  Increase strength to at least 4/5 throughout shoulder. Baseline: 2+ > 3 Goal status: Ongoing   12/05/2023  4.  Reduce pain with all activities to 2 out of 10 or less. Baseline:  Goal status: Ongoing   12/05/2023  5.  Improve PSFS score by at least 2 full points for a measurable improvement. Baseline: 0 Goal status: Ongoing   12/05/2023  PLAN: PT FREQUENCY: 2x/week  PT DURATION: 8 weeks  PLANNED INTERVENTIONS: 97164- PT Re-evaluation, 97110-Therapeutic exercises, 97530- Therapeutic activity, 97112- Neuromuscular re-education, 97535- Self Care, 02859- Manual therapy, [id number]- Aquatic Therapy, H9716- Electrical stimulation (unattended), 97016- Vasopneumatic device, [id number]- Ultrasound, [id number]- Traction (mechanical), [id number]- Ionotophoresis 4mg /ml Dexamethasone , Patient/Family education, Joint mobilization, Scar mobilization, Cryotherapy, and Moist heat  PLAN FOR NEXT SESSION:  check on positioning and see if it helped with her pain,  AAROM, AROM progression, no strengthening or biceps loading per protocol.     Grayce Spatz, PT, DPT 12/05/2023, 4:29 PM

## 2023-12-07 NOTE — Therapy (Signed)
 OUTPATIENT PHYSICAL THERAPY TREATMENT   Patient Name: Emily Hester MRN: [id number] DOB:[birth year], 53 y.o., female Today's Date: 12/08/2023  END OF SESSION:  PT End of Session - 12/08/23 0846     Visit Number 4    Number of Visits 17    Date for Recertification  01/23/24    Authorization Type [employer]    PT Start Time [phone]    PT Stop Time 0935    PT Time Calculation (min) 48 min    Activity Tolerance Patient limited by pain    Behavior During Therapy [facility] for tasks assessed/performed             Past Medical History:  Diagnosis Date   Arthritis    patient denies   Asthma    Back pain    Chest pain    Constipation    Diabetes mellitus without complication (HCC)    type    Food allergy    GERD (gastroesophageal reflux disease)    Heartburn    Hyperlipidemia    Hypertension    Interstitial cystitis    IUD (intrauterine device) in place placed 6 weeks ago   Joint pain    Lactose intolerance    Migraines    MIgraines- not current     OSA (obstructive sleep apnea)    Osteoarthritis    Palpitations    Pneumonia    SOB (shortness of breath)    Swallowing problem    Vitamin D  deficiency    Past Surgical History:  Procedure Laterality Date   ANTERIOR HIP REVISION Right 05/20/2020   Procedure: RIGHT TOTAL HIP REVISION- ploy exhange and hip ball exchange with bone graft;  Surgeon: Vernetta Lonni GRADE, MD;  Location: [facility];  Service: Orthopedics;  Laterality: Right;   DILATION AND EVACUATION  09/30/2011   Procedure: DILATATION AND EVACUATION;  Surgeon: Lynwood FORBES Curlene PONCE, MD;  Location: [facility];  Service: Gynecology;  Laterality: N/A;   left knee surgery      arthroscopy    TOTAL HIP ARTHROPLASTY Right 03/11/2017   Procedure: RIGHT TOTAL HIP ARTHROPLASTY ANTERIOR APPROACH;  Surgeon: Vernetta Lonni GRADE, MD;  Location: [facility];  Service: Orthopedics;  Laterality: Right;   Patient Active Problem List   Diagnosis Date Noted   BMI 40.0-44.9, adult (HCC)  current 40.05 10/20/2023   Vitamin D  deficiency 10/20/2023   Other fatigue 10/20/2023   Lactose intolerance 10/20/2023   DOE (dyspnea on exertion) 10/20/2023   Nut allergy 10/20/2023   Herpes simplex type 1 infection 10/13/2022   Chronic interstitial cystitis 10/13/2022   Morbid obesity (HCC) 10/13/2022   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HCC) 05/20/2020   Status post revision of total hip 05/20/2020   Trochanteric bursitis, right hip 09/05/2019   Hyperlipidemia 08/31/2019   Diabetes mellitus (HCC) 08/31/2019   Arthritis 08/31/2019   Overactive bladder 08/31/2019   Hypertensive disorder 08/30/2018   Asthma 08/30/2018   Migraine 08/30/2018   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   Unilateral primary osteoarthritis, right hip 01/27/2017   IUD (intrauterine device) in place 01/13/2017    PCP: [name], MD  REFERRING PROVIDER: Genelle Standing, MD  REFERRING DIAG: [phone] (ICD-10-CM) - Chronic left shoulder pain   THERAPY DIAG:  Chronic left shoulder pain  Stiffness of left shoulder, not elsewhere classified  Muscle weakness (generalized)  Rationale for Evaluation and Treatment: Rehabilitation  ONSET DATE:   11/24/2023 surgery   SUBJECTIVE:  SUBJECTIVE STATEMENT: Patient reports soreness/pain rated 4/10 this morning. Patient reports not wearing sling at home.  Hand dominance: Right  PERTINENT HISTORY: L shoulder injection 06/15/23  PAIN:  NPRS scale:  since PT - at rest 4/10-6/10, standing & walking 5/10-8/10,  exercises 8/10 Pain location: left shoulder anterior Pain description: stinging, burning Aggravating factors: motion Relieving factors: ice  PRECAUTIONS: Lt Shoulder scope 11/24/23 AROM prn- follow biceps tenodesis protocol  RED FLAGS: None   WEIGHT  BEARING RESTRICTIONS: No  FALLS:  Has patient fallen in last 6 months? Unknown  LIVING ENVIRONMENT: Lives with: lives with their family Lives in: House/apartment Has following equipment at home: sling  OCCUPATION: Not working  PLOF: Independent  PATIENT GOALS: decrease pain  NEXT MD VISIT: 12/08/23  OBJECTIVE:  Note: Objective measures were completed at Evaluation unless otherwise noted.  DIAGNOSTIC FINDINGS:  MRI L Shoulder 05/19/23 IMPRESSION: 1. Mild supraspinatus tendinosis without a tear.  PATIENT SURVEYS :  PSFS: THE PATIENT SPECIFIC FUNCTIONAL SCALE  Place score of 0-10 (0 = unable to perform activity and 10 = able to perform activity at the same level as before injury or problem)  Activity Date: 11/28/23    Do hairstyle 0    2.Pick up heavy items 0    3.Sleep on arm  0    4. Lift arm up 0    Total Score 0      Total Score = Sum of activity scores/number of activities  Minimally Detectable Change: 3 points (for single activity); 2 points (for average score)  [facility] (nd). The Patient Specific Functional Scale . Retrieved from [email or web address]   COGNITION: Eval   Overall cognitive status: Within functional limits for tasks assessed     SENSATION: Eval: WFL  POSTURE: Eval L shoulder higher, rounded shoulders moderate  UPPER EXTREMITY ROM:    AROM/PROM Left eval Left 9/172025 Supine  12/08/2023  Shoulder flexion 22/90 115 PROM 124deg PROM  Shoulder extension 40/    Shoulder abduction 25/45 90 PROM 90deg PROM (painful)  Shoulder adduction     Shoulder internal rotation Crest/30 at 45    Shoulder external rotation 15/20 30 PROM in 30 deg abduction 20deg PROM in 30deg abduction   Elbow flexion     Elbow extension     Wrist flexion     Wrist extension     Wrist ulnar deviation     Wrist radial deviation     Wrist pronation     Wrist supination     (Blank rows = not  tested)  UPPER EXTREMITY MMT:  MMT Left eval   Shoulder flexion 2+   Shoulder extension 3   Shoulder abduction 2+   Shoulder adduction    Shoulder internal rotation 3   Shoulder external rotation 3   Middle trapezius    Lower trapezius    Elbow flexion    Elbow extension    Wrist flexion    Wrist extension    Wrist ulnar deviation    Wrist radial deviation    Wrist pronation    Wrist supination    Grip strength (lbs)    (Blank rows = not tested)  SHOULDER SPECIAL TESTS: Eval No special tests due to post op status  JOINT MOBILITY TESTING:  Eval Decreased throughout   PALPATION:  Eval 2+ tenderness at incisions and mid deltoid.  TREATMENT        DATE: 12/08/2023 Manual:  Supine Lt shoulder PROM flexion, abduction with  grade 2 inferior glide, ER with attempted grade 1-2 posterior glide (discontinued glide due to discomfort) and very hesitant    TherEx:  Supine wand chest press with serratus punch 2x10  Supine wand shoulder flexion through very limited ROM due to pain 1x10  AAROM shoulder abduction with wand , discontinued after 2 reps secondary to pain  AAROM shoulder abduction with PT assist with wrist sup/pro performed at each max stretch 2x3  AAROM shoulder ER with wand at 45deg abduction and towel placed under arm, 2x12 with 3-5s hold    Vaso:  Lt shoulder supported with low compression at 34deg for 5 minutes (discontinued early due to MD appointment)    TREATMENT        DATE: 12/05/2023 Therapeutic Exercise: supine wand chest press with end range Serratus punch 10 reps 2 sets.  Supine wand flexion with end range deep breath stretch. 10 reps 2 sets.  Standing pendulum LUE start & end small motion and progress to max tolerable 10 reps for flex/ext, abd/add and circles CW/CCW.  Pt demo understanding and reports decrease  shoulder pain.  Passive range of motion for left shoulder flexion, internal and external rotation to tolerance.  Self-care: PT demo & verbal cues on positioning seated, standing and bed (supine & right side lying) so weight of arm is supported.  Pt verbalized understanding.  PT recommended wearing arm sling when going out in public as visual sign to protect her arm.  Pt verbalized understanding.     Vaso seated with left upper extremity supported medium compression 34*for 10 minutes into session for edema control  TREATMENT        DATE: 11/30/2023 Therex: Supine Wand AAROM flexion 2-3 sec hold 2 x 10  Supine wand AAROM protraction in 90 deg flexion 3 sec hold x 10  Supine wand Passive ER stretch with arm at side and towel under arm 2-3 sec hold x 10  UBE fwd/back 3 mins each way AAROM lvl 1.5 with 1 min rest between directions. Seated scapular retraction 5 sec hold x 10 Verbal cues for pendulums and grip squeeze at home.    Manual Supine Lt shoulder inferior glides g2 in flexion, scaption and abduction, Distraction with PROM at tolerance.  Posterior glide Lt GH joint with passive ER to tolerance.        TREATMENT        DATE: 11/28/23 Initial evaluation completed followed by instruction and trial of HEP for correct form and comprehension.   PATIENT EDUCATION: Education details: HEP Person educated: Patient Education method: Programmer, multimedia, Demonstration, Actor cues, and Verbal cues Education comprehension: verbalized understanding  HOME EXERCISE PROGRAM: Access Code: [email or web address] URL: [email or web address] Date: 11/28/2023 Prepared by: Burnard Meth  Exercises - Seated Scapular Retraction  - 2 x daily - 7 x weekly - 2 sets - 10 reps - Seated Shoulder Shrugs  - 2 x daily - 7 x weekly - 2 sets - 10 reps - Supine Shoulder Flexion Extension AAROM with Dowel  - 2 x daily - 7 x weekly - 2 sets - 10 reps    ASSESSMENT:  CLINICAL IMPRESSION: Patient arrived to  session noting no big changes in symptoms, though recognizes that pain and soreness is typical following this surgery. PT and patient discussed pillow placement for sleeping and will reassess at next session. Patient continues to have deficits with ROM and strength, though  is slowly making improvements. Patient will continue to benefit from skilled PT.   OBJECTIVE IMPAIRMENTS: decreased ROM, decreased strength, hypomobility, impaired UE functional use, and pain.   ACTIVITY LIMITATIONS: carrying, lifting, sleeping, bathing, dressing, self feeding, and reach over head  PARTICIPATION LIMITATIONS: meal prep, cleaning, laundry, driving, and community activity  PERSONAL FACTORS: None- are also affecting patient's functional outcome.   REHAB POTENTIAL: Good  CLINICAL DECISION MAKING: Stable/uncomplicated  EVALUATION COMPLEXITY: Moderate  GOALS: Goals reviewed with patient? Yes  SHORT TERM GOALS: Target date: 12/19/2023    Pt to be independent with HEP. Baseline: Goal status: Ongoing   12/05/2023  2.  Decreased pain by 1 level. Baseline:  Goal status: Ongoing   12/05/2023   LONG TERM GOALS: Target date: 01/23/2024    Patient to be independent in self progressive HEP by discharge. Baseline:  Goal status: Ongoing   12/05/2023  2.  Increase active range of motion to WNL throughout. Baseline:  Goal status: Ongoing   12/05/2023  3.  Increase strength to at least 4/5 throughout shoulder. Baseline: 2+ > 3 Goal status: Ongoing   12/05/2023  4.  Reduce pain with all activities to 2 out of 10 or less. Baseline:  Goal status: Ongoing   12/05/2023  5.  Improve PSFS score by at least 2 full points for a measurable improvement. Baseline: 0 Goal status: Ongoing   12/05/2023  PLAN: PT FREQUENCY: 2x/week  PT DURATION: 8 weeks  PLANNED INTERVENTIONS: 97164- PT Re-evaluation, 97110-Therapeutic exercises, 97530- Therapeutic activity, 97112- Neuromuscular re-education, 97535- Self Care, 02859-  Manual therapy, [id number]- Aquatic Therapy, H9716- Electrical stimulation (unattended), 97016- Vasopneumatic device, [id number]- Ultrasound, [id number]- Traction (mechanical), [id number]- Ionotophoresis 4mg /ml Dexamethasone , Patient/Family education, Joint mobilization, Scar mobilization, Cryotherapy, and Moist heat  PLAN FOR NEXT SESSION:   reassess positioning and see if it helped with her pain,  AAROM, AROM progression, no strengthening or biceps loading per protocol.     Susannah Daring, PT, DPT 12/08/23 9:40 AM

## 2023-12-08 ENCOUNTER — Ambulatory Visit (HOSPITAL_BASED_OUTPATIENT_CLINIC_OR_DEPARTMENT_OTHER): Admitting: Orthopaedic Surgery

## 2023-12-08 ENCOUNTER — Ambulatory Visit (INDEPENDENT_AMBULATORY_CARE_PROVIDER_SITE_OTHER)

## 2023-12-08 ENCOUNTER — Encounter

## 2023-12-08 DIAGNOSIS — G8929 Other chronic pain: Secondary | ICD-10-CM

## 2023-12-08 DIAGNOSIS — M25612 Stiffness of left shoulder, not elsewhere classified: Secondary | ICD-10-CM

## 2023-12-08 DIAGNOSIS — M25512 Pain in left shoulder: Secondary | ICD-10-CM

## 2023-12-08 DIAGNOSIS — M6281 Muscle weakness (generalized): Secondary | ICD-10-CM

## 2023-12-08 NOTE — Progress Notes (Signed)
 Post Operative Evaluation    Procedure/Date of Surgery: Left shoulder arthroscopy with biceps tenodesis 9/11  Interval History:  Presents 2 weeks status post above procedure.  Overall she is doing well with physical therapy and progress is already coming along nicely.   PMH/PSH/Family History/Social History/Meds/Allergies:    Past Medical History:  Diagnosis Date   Arthritis    patient denies   Asthma    Back pain    Chest pain    Constipation    Diabetes mellitus without complication (HCC)    type    Food allergy    GERD (gastroesophageal reflux disease)    Heartburn    Hyperlipidemia    Hypertension    Interstitial cystitis    IUD (intrauterine device) in place placed 6 weeks ago   Joint pain    Lactose intolerance    Migraines    MIgraines- not current     OSA (obstructive sleep apnea)    Osteoarthritis    Palpitations    Pneumonia    SOB (shortness of breath)    Swallowing problem    Vitamin D  deficiency    Past Surgical History:  Procedure Laterality Date   ANTERIOR HIP REVISION Right 05/20/2020   Procedure: RIGHT TOTAL HIP REVISION- ploy exhange and hip ball exchange with bone graft;  Surgeon: Vernetta Lonni GRADE, MD;  Location: [facility];  Service: Orthopedics;  Laterality: Right;   DILATION AND EVACUATION  09/30/2011   Procedure: DILATATION AND EVACUATION;  Surgeon: Lynwood FORBES Curlene PONCE, MD;  Location: [facility];  Service: Gynecology;  Laterality: N/A;   left knee surgery      arthroscopy    TOTAL HIP ARTHROPLASTY Right 03/11/2017   Procedure: RIGHT TOTAL HIP ARTHROPLASTY ANTERIOR APPROACH;  Surgeon: Vernetta Lonni GRADE, MD;  Location: [facility];  Service: Orthopedics;  Laterality: Right;   Social History   Socioeconomic History   Marital status: Married    Spouse name: Not on file   Number of children: Not on file   Years of education: Not on file   Highest education level: Not on file  Occupational History   Not on  file  Tobacco Use   Smoking status: Never    Passive exposure: Current   Smokeless tobacco: Never  Vaping Use   Vaping status: Never Used  Substance and Sexual Activity   Alcohol use: No   Drug use: No   Sexual activity: Not on file  Other Topics Concern   Not on file  Social History Narrative   Not on file   Social Drivers of Health   Financial Resource Strain: Not on file  Food Insecurity: Low Risk  (07/19/2023)   Received from [facility]   Hunger Vital Sign    Within the past 12 months, you worried that your food would run out before you got money to buy more: Never true    Within the past 12 months, the food you bought just didn't last and you didn't have money to get more. : Never true  Transportation Needs: No Transportation Needs (07/19/2023)   Received from [facility]    In the past 12 months, has lack of reliable transportation kept you from medical appointments, meetings, work or from getting things needed for daily living? : No  Physical Activity:  Not on file  Stress: Not on file  Social Connections: Not on file   Family History  Problem Relation Age of Onset   Stroke Mother    Hyperlipidemia Mother    Hypertension Mother    Diabetes Mother    Heart disease Mother    Hyperlipidemia Father    Hypertension Father    Diabetes Father    Eczema Sister    Asthma Brother    Eczema Daughter    Migraines Daughter    Migraines Daughter    Asthma Son    Allergic rhinitis Neg Hx    Angioedema Neg Hx    Immunodeficiency Neg Hx    Urticaria Neg Hx    Headache Neg Hx    Sleep apnea Neg Hx    Allergies  Allergen Reactions   Amoxicillin Hives, Swelling and Other (See Comments)    Has patient had a PCN reaction causing immediate rash, facial/tongue/throat swelling, SOB or lightheadedness with hypotension: Yes Has patient had a PCN reaction causing severe rash involving mucus membranes or skin necrosis: No Has patient had a PCN reaction that  required hospitalization: Yes Has patient had a PCN reaction occurring within the last 10 years: No  If all of the above answers are NO, then may proceed with Cephalosporin use.    Lisinopril Itching and Cough    Heart racing    Olmesartan     jittery   Bactrim Rash   Current Outpatient Medications  Medication Sig Dispense Refill   acetaminophen  (TYLENOL ) 500 MG tablet Take 1 tablet (500 mg total) by mouth every 6 (six) hours as needed for up to 14 days. 30 tablet 0   albuterol  (PROVENTIL ) (2.5 MG/3ML) 0.083% nebulizer solution Take 3 mLs (2.5 mg total) by nebulization every 6 (six) hours as needed for wheezing or shortness of breath. 75 mL 0   albuterol  (VENTOLIN  HFA) 108 (90 Base) MCG/ACT inhaler Inhale 2 puffs into the lungs every 4 (four) hours as needed. 18 g 1   amLODipine  (NORVASC ) 10 MG tablet Take 10 mg by mouth every morning.     aspirin  EC 325 MG tablet Take 1 tablet (325 mg total) by mouth daily. 14 tablet 0   aspirin -acetaminophen -caffeine (EXCEDRIN MIGRAINE) 250-250-65 MG tablet Take 2 tablets by mouth every 6 (six) hours as needed for headache.     budesonide -formoterol  (SYMBICORT ) 160-4.5 MCG/ACT inhaler Inhale 2 puffs into the lungs in the morning and at bedtime. 1 each 1   butalbital-acetaminophen -caffeine (FIORICET) 50-325-40 MG tablet      cetirizine  (ZYRTEC ) 10 MG tablet Take 1 tablet (10 mg total) by mouth daily. 90 tablet 1   Cholecalciferol  (VITAMIN D ) 50 MCG (2000 UT) tablet Take 1 tablet (2,000 Units total) by mouth daily. 30 tablet 0   cyclobenzaprine  (FLEXERIL ) 10 MG tablet Take 1 tablet (10 mg total) by mouth 3 (three) times daily as needed for muscle spasms. 60 tablet 1   diphenhydrAMINE  (BENADRYL ) 25 MG tablet Take 25 mg by mouth daily as needed for allergies.     EPINEPHrine  (NEFFY ) 2 MG/0.1ML SOLN Place 1 spray into the nose as needed. 6 each 1   EPINEPHrine  0.3 mg/0.3 mL IJ SOAJ injection Inject 0.3 mg into the muscle as directed.     ibuprofen  (ADVIL )  800 MG tablet Take 1 tablet (800 mg total) by mouth every 8 (eight) hours as needed for up to 14 days. 30 tablet 0   ipratropium (ATROVENT ) 0.06 % nasal spray Apply 2 sprays each  nostril up to 3-4 times a day as needed 45 mL 1   Lancets (ONETOUCH DELICA PLUS LANCET33G) MISC Apply 1 each topically daily.     levonorgestrel  (MIRENA ) 20 MCG/24HR IUD 1 each by Intrauterine route once.     lidocaine  (LIDODERM ) 5 % Place 1 patch onto the skin daily as needed (pain). Remove & Discard patch within 12 hours or as directed by MD 30 patch 3   losartan  (COZAAR ) 25 MG tablet Take 25 mg by mouth daily.  0   metFORMIN  (GLUCOPHAGE -XR) 750 MG 24 hr tablet Take 750 mg by mouth 2 (two) times daily.     montelukast  (SINGULAIR ) 10 MG tablet Take 1 tablet (10 mg total) by mouth at bedtime. 90 tablet 1   nabumetone  (RELAFEN ) 750 MG tablet Take 1 tablet (750 mg total) by mouth 2 (two) times daily as needed. 60 tablet 1   nystatin cream (MYCOSTATIN) APPLY EXTERNALLY TWICE A DAY AS NEEDED 30     Olopatadine  HCl (PATADAY ) 0.2 % SOLN Place 1 drop into both eyes daily as needed. 7.5 mL 1   omeprazole  (PRILOSEC) 40 MG capsule Take 1 capsule by mouth daily.     ondansetron  (ZOFRAN ) 8 MG tablet Take 8 mg by mouth every 8 (eight) hours as needed for nausea or vomiting.     ONETOUCH VERIO test strip as directed.   3   oxybutynin (DITROPAN-XL) 10 MG 24 hr tablet Take 10 mg by mouth daily.     oxyCODONE  (ROXICODONE ) 5 MG immediate release tablet Take 1 tablet (5 mg total) by mouth every 4 (four) hours as needed for severe pain (pain score 7-10) or breakthrough pain. 30 tablet 0   promethazine -dextromethorphan (PROMETHAZINE -DM) 6.25-15 MG/5ML syrup Take 5 mLs by mouth 3 (three) times daily as needed for cough. 118 mL 0   rosuvastatin  (CRESTOR ) 10 MG tablet Take 10 mg by mouth daily.  3   solifenacin (VESICARE) 5 MG tablet Take 5 mg by mouth daily.     Spacer/Aero-Holding Chambers DEVI Take 1 each by mouth as directed. 1 each 0    tirzepatide  (MOUNJARO ) 15 MG/0.5ML Pen Inject 15 mg into the skin once a week. 6 mL 0   zonisamide (ZONEGRAN) 25 MG capsule Take 50 mg by mouth 2 (two) times daily.     No current facility-administered medications for this visit.   No results found.  Review of Systems:   A ROS was performed including pertinent positives and negatives as documented in the HPI.   Musculoskeletal Exam:    There were no vitals taken for this visit.  Left shoulder incisions are well-appearing without erythema or drainage.  Active forward elevation is to 100 degrees.  External rotation at side to 30 degrees internal rotation deferred today just neurosensory intact  Imaging:      I personally reviewed and interpreted the radiographs.   Assessment:   2 weeks status post left shoulder arthroscopy with biceps tenodesis doing well.  At this time she will continue to progress to the biceps protocol.  I will plan to see her back in 4 weeks for reassessment  Plan :    - Return to clinic 4 weeks for reassessment      I personally saw and evaluated the patient, and participated in the management and treatment plan.  Elspeth Parker, MD Attending Physician, Orthopedic Surgery  This document was dictated using Dragon voice recognition software. A reasonable attempt at proof reading has been made to minimize errors.

## 2023-12-12 ENCOUNTER — Encounter (INDEPENDENT_AMBULATORY_CARE_PROVIDER_SITE_OTHER): Payer: Self-pay | Admitting: Nurse Practitioner

## 2023-12-12 ENCOUNTER — Ambulatory Visit (INDEPENDENT_AMBULATORY_CARE_PROVIDER_SITE_OTHER): Admitting: Nurse Practitioner

## 2023-12-12 VITALS — BP 121/77 | HR 80 | Temp 98.5°F | Ht 64.5 in | Wt 233.0 lb

## 2023-12-12 DIAGNOSIS — Z7984 Long term (current) use of oral hypoglycemic drugs: Secondary | ICD-10-CM

## 2023-12-12 DIAGNOSIS — E1159 Type 2 diabetes mellitus with other circulatory complications: Secondary | ICD-10-CM

## 2023-12-12 DIAGNOSIS — E66812 Obesity, class 2: Secondary | ICD-10-CM

## 2023-12-12 DIAGNOSIS — E1169 Type 2 diabetes mellitus with other specified complication: Secondary | ICD-10-CM

## 2023-12-12 DIAGNOSIS — E785 Hyperlipidemia, unspecified: Secondary | ICD-10-CM | POA: Diagnosis not present

## 2023-12-12 DIAGNOSIS — E559 Vitamin D deficiency, unspecified: Secondary | ICD-10-CM

## 2023-12-12 DIAGNOSIS — Z7985 Long-term (current) use of injectable non-insulin antidiabetic drugs: Secondary | ICD-10-CM

## 2023-12-12 DIAGNOSIS — I152 Hypertension secondary to endocrine disorders: Secondary | ICD-10-CM

## 2023-12-12 DIAGNOSIS — Z6839 Body mass index (BMI) 39.0-39.9, adult: Secondary | ICD-10-CM

## 2023-12-12 MED ORDER — TIRZEPATIDE 15 MG/0.5ML ~~LOC~~ SOAJ: 15.0000 mg | SUBCUTANEOUS | 1 refills | Status: DC

## 2023-12-12 MED ORDER — VITAMIN D 50 MCG (2000 UT) PO TABS: 2000.0000 [IU] | ORAL_TABLET | Freq: Every day | ORAL | 1 refills | Status: DC

## 2023-12-12 NOTE — Progress Notes (Signed)
 Office: [phone]  /  Fax: [phone]  WEIGHT SUMMARY AND BIOMETRICS  Weight Lost Since Last Visit: 0lb  Weight Gained Since Last Visit: 3lb   Vitals Temp: 98.5 F (36.9 C) BP: 121/77 Pulse Rate: 80 SpO2: 97 %   Anthropometric Measurements Height: 5' 4.5 (1.638 m) Weight: 233 lb (105.7 kg) BMI (Calculated): 39.39 Weight at Last Visit: 230lb Weight Lost Since Last Visit: 0lb Weight Gained Since Last Visit: 3lb Starting Weight: 237lb Total Weight Loss (lbs): 4 lb (1.814 kg) Peak Weight: 280lb Waist Measurement : 51 inches   Body Composition  Body Fat %: 49.9 % Fat Mass (lbs): 116.4 lbs Muscle Mass (lbs): 111 lbs Total Body Water  (lbs): 87.4 lbs Visceral Fat Rating : 15   Other Clinical Data RMR: 1354 Fasting: No Labs: no Today's Visit #: 4 Starting Date: 10/20/23    Total Weight Loss: 4 pounds Bio Impedance Data reviewed with patient: Muscle is down 2.8 pounds, adipose is up 6 pounds.   HPI  Chief Complaint: OBESITY  Emily Hester is here to discuss her progress with her obesity treatment plan. She is on the the Category 2 Plan and states she is following her eating plan approximately 3 % of the time. She states she is not currently exercising.    Interval History:  Since last office visit she had laparoscopic left shoulder surgery 11/24/23 done outpatient. She is currently in an arm brace and has at least 2 more weeks of immobility. She has started PT 2 times a week.  Has not been following the diet as she has been unable to cook. She has been skipping a lot of meals. Has not been getting 100 grams of protein daily. She has been getting 32 ounces of water . She describes pain as manageable.   Her hypertension is currently well controlled with amlodipine  10 mg every day, losartan  25 mg every day . Denies headaches, chest pain, visual changes and shortness of breath at rest. BP Readings from Last 3 Encounters:  12/12/23 121/77  11/21/23 132/82  11/03/23  101/69     She started Mounjaro  in 05/2023 and has lost 20 pounds so far.  Her Mounjaro  was increased to 15 mg SQ qw at last visit and continues on Metformin  XR 750 mg BID for Type 2 DM. Last A1c was improved at 5.7. Denies side effects with increase in Mounjaro  dose.   She is currently on Rosuvastatin  10 mg every day for hyperlipidemia and last cholesterol 157, LDL 83, HDL 50. Denies side effects.   She is currently on Vitamin D  50 mcg daily for low normal Vit D.  Last vitamin D  Lab Results  Component Value Date   VD25OH 33.4 10/20/2023     PHYSICAL EXAM:  Blood pressure 121/77, pulse 80, temperature 98.5 F (36.9 C), height 5' 4.5 (1.638 m), weight 233 lb (105.7 kg), SpO2 97%. Body mass index is 39.38 kg/m.  General: Well Developed, well nourished, and in no acute distress.  HEENT: Normocephalic, atraumatic; EOMI, sclerae are anicteric. Skin: Warm and dry, good turgor Chest:  Normal excursion, shape, no gross ABN Respiratory: No conversational dyspnea; speaking in full sentences NeuroM-Sk:  Normal gross ROM * 4 extremities  Psych: A and O X 3, insight adequate, mood- full    DIAGNOSTIC DATA REVIEWED:  Last metabolic panel Lab Results  Component Value Date   GLUCOSE 73 10/20/2023   NA 139 10/20/2023   K 4.2 10/20/2023   CL 103 10/20/2023   CO2 19 (L) 10/20/2023  BUN 12 10/20/2023   CREATININE 0.77 10/20/2023   EGFR 92 10/20/2023   CALCIUM  9.7 10/20/2023   PROT 7.6 10/20/2023   ALBUMIN 4.7 10/20/2023   LABGLOB 2.9 10/20/2023   BILITOT 0.5 10/20/2023   ALKPHOS 143 (H) 10/20/2023   AST 17 10/20/2023   ALT 16 10/20/2023   ANIONGAP 9 03/13/2023     Lab Results  Component Value Date   HGBA1C 5.7 (H) 10/20/2023   HGBA1C 6.5 (H) 03/03/2017   Lab Results  Component Value Date   INSULIN  23.8 10/20/2023   Lab Results  Component Value Date   TSH 1.670 10/20/2023   CBC    Component Value Date/Time   WBC 10.1 10/20/2023 1014   WBC 8.8 03/13/2023 0209   RBC  5.04 10/20/2023 1014   RBC 4.96 03/13/2023 0209   HGB 13.2 10/20/2023 1014   HCT 42.8 10/20/2023 1014   PLT 291 10/20/2023 1014   MCV 85 10/20/2023 1014   MCH 26.2 (L) 10/20/2023 1014   MCH 27.0 03/13/2023 0209   MCHC 30.8 (L) 10/20/2023 1014   MCHC 31.8 03/13/2023 0209   RDW 14.7 10/20/2023 1014   Lipid Panel     Component Value Date/Time   CHOL 157 10/20/2023 1014   TRIG 138 10/20/2023 1014   HDL 50 10/20/2023 1014   LDLCALC 83 10/20/2023 1014    Nutritional Lab Results  Component Value Date   VD25OH 33.4 10/20/2023   Lab Results  Component Value Date   VITAMINB12 297 10/20/2023      ASSESSMENT AND PLAN Class 2 severe obesity with serious comorbidity and body mass index (BMI) of 39.0 to 39.9 in adult, unspecified obesity type TREATMENT PLAN FOR OBESITY:  Recommended Dietary Goals  Emily Hester is currently in the action stage of change. As such, her goal is to continue weight management plan. She has agreed to the Category 2 Plan.  Behavioral Intervention  We discussed the following Behavioral Modification Strategies today: continue to work on maintaining a reduced calorie state, getting the recommended amount of protein, incorporating whole foods, making healthy choices, staying well hydrated and practicing mindfulness when eating. and increase protein intake, fibrous foods (25 grams per day for women, 30 grams for men) and water  to improve satiety and decrease hunger signals. Stressed importance of getting 90 grams of protein daily and to try to get large amount of protein(30 grams) every 6 hours while awake. Aim to get 64 -90 ounces of water  daily   Recommended Physical Activity Goals  Emily Hester has been advised to work up to 150 minutes of moderate intensity aerobic activity a week and strengthening exercises 2-3 times per week for cardiovascular health, weight loss maintenance and preservation of muscle mass.   She has agreed to Think about enjoyable ways to increase  daily physical activity and overcoming barriers to exercise and Increase physical activity in their day and reduce sedentary time (increase NEAT).   Pharmacotherapy We discussed various medication options to help Emily Hester with her weight loss efforts and we both agreed to continue Mounjaro  15 mg SQ QW for diabetes and off label for weight loss.  ASSOCIATED CONDITIONS ADDRESSED TODAY  Action/Plan  Type 2 diabetes mellitus with hyperlipidemia (HCC) Continue Category 2  meal plan, limit simple carbohydrates Decreasing body weight by 10-15% can improve glucose levels Exercise currently limited due to shoulder surgery Continue Mounjaro  15 mg SQ QW Continue Metformin  XR 750 mg BID -     Tirzepatide ; Inject 15 mg into the skin once  a week.  Dispense: 2 mL; Refill: 1  Hypertension associated with type 2 diabetes mellitus (HCC)       Continue amlodipine  10 mg every day and losartan  25 mg every day  every day Continue Category 2 meal plan  and DASH diet Monitor BP and if consistently >140/90 notify PCP If develops headaches, chest pain, shortness of breath or dizziness go to ER Loss of 10-15% body weight can help improve blood pressures   Vitamin D  deficiency       Continue Vit D3 Cholecalciferol  2000 units daily           Return in about 4 weeks (around 01/09/2024).Emily Hester She was informed of the importance of frequent follow up visits to maximize her success with intensive lifestyle modifications for her multiple health conditions.   ATTESTASTION STATEMENTS:  Reviewed by clinician on day of visit: allergies, medications, problem list, medical history, surgical history, family history, social history, and previous encounter notes.   I personally spent a total of 33 minutes in the care of the patient today including preparing to see the patient, getting/reviewing separately obtained history, performing a medically appropriate exam/evaluation, counseling and educating, placing orders,  documenting clinical information in the EHR, and coordinating care.   Orvie Caradine ANP-C

## 2023-12-12 NOTE — Therapy (Signed)
 OUTPATIENT PHYSICAL THERAPY TREATMENT   Patient Name: Emily Hester MRN: [id number] DOB:26-Jun-1970, 53 y.o., female Today's Date: 12/13/2023  END OF SESSION:  PT End of Session - 12/13/23 1431     Visit Number 5    Number of Visits 17    Date for Recertification  01/23/24    Authorization Type [employer]    PT Start Time 1345    PT Stop Time 1425    PT Time Calculation (min) 40 min    Activity Tolerance Patient tolerated treatment well    Behavior During Therapy WFL for tasks assessed/performed              Past Medical History:  Diagnosis Date   Arthritis    patient denies   Asthma    Back pain    Chest pain    Constipation    Diabetes mellitus without complication (HCC)    type    Food allergy    GERD (gastroesophageal reflux disease)    Heartburn    Hyperlipidemia    Hypertension    Interstitial cystitis    IUD (intrauterine device) in place placed 6 weeks ago   Joint pain    Lactose intolerance    Migraines    MIgraines- not current     OSA (obstructive sleep apnea)    Osteoarthritis    Palpitations    Pneumonia    SOB (shortness of breath)    Swallowing problem    Vitamin D  deficiency    Past Surgical History:  Procedure Laterality Date   ANTERIOR HIP REVISION Right 05/20/2020   Procedure: RIGHT TOTAL HIP REVISION- ploy exhange and hip ball exchange with bone graft;  Surgeon: Vernetta Lonni GRADE, MD;  Location: [facility];  Service: Orthopedics;  Laterality: Right;   DILATION AND EVACUATION  09/30/2011   Procedure: DILATATION AND EVACUATION;  Surgeon: Lynwood FORBES Curlene PONCE, MD;  Location: [facility];  Service: Gynecology;  Laterality: N/A;   left knee surgery      arthroscopy    TOTAL HIP ARTHROPLASTY Right 03/11/2017   Procedure: RIGHT TOTAL HIP ARTHROPLASTY ANTERIOR APPROACH;  Surgeon: Vernetta Lonni GRADE, MD;  Location: [facility];  Service: Orthopedics;  Laterality: Right;   Patient Active Problem List   Diagnosis Date Noted   BMI 40.0-44.9, adult  (HCC) current 40.05 10/20/2023   Vitamin D  deficiency 10/20/2023   Other fatigue 10/20/2023   Lactose intolerance 10/20/2023   DOE (dyspnea on exertion) 10/20/2023   Nut allergy 10/20/2023   Herpes simplex type 1 infection 10/13/2022   Chronic interstitial cystitis 10/13/2022   Morbid obesity (HCC) 10/13/2022   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HCC) 05/20/2020   Status post revision of total hip 05/20/2020   Trochanteric bursitis, right hip 09/05/2019   Hyperlipidemia 08/31/2019   Diabetes mellitus (HCC) 08/31/2019   Arthritis 08/31/2019   Overactive bladder 08/31/2019   Hypertensive disorder 08/30/2018   Asthma 08/30/2018   Migraine 08/30/2018   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   Unilateral primary osteoarthritis, right hip 01/27/2017   IUD (intrauterine device) in place 01/13/2017    PCP: [name], MD  REFERRING PROVIDER: Vernetta Lonni GRADE*  REFERRING DIAG: [phone] (ICD-10-CM) - Chronic left shoulder pain   THERAPY DIAG:  Chronic left shoulder pain  Stiffness of left shoulder, not elsewhere classified  Muscle weakness (generalized)  Rationale for Evaluation and Treatment: Rehabilitation  ONSET DATE:   11/24/2023 surgery   SUBJECTIVE:  SUBJECTIVE STATEMENT: Patient states she is working on not wearing sling except for out in community.  Hand dominance: Right  PERTINENT HISTORY: L shoulder injection 06/15/23  PAIN:  NPRS scale:  5/10 today Pain location: left shoulder anterior Pain description: stinging, burning Aggravating factors: motion Relieving factors: ice  PRECAUTIONS: Lt Shoulder scope 11/24/23 AROM prn- follow biceps tenodesis protocol  RED FLAGS: None   WEIGHT BEARING RESTRICTIONS: No  FALLS:  Has patient fallen in last 6  months? Unknown  LIVING ENVIRONMENT: Lives with: lives with their family Lives in: House/apartment Has following equipment at home: sling  OCCUPATION: Not working  PLOF: Independent  PATIENT GOALS: decrease pain  NEXT MD VISIT: 12/08/23  OBJECTIVE:  Note: Objective measures were completed at Evaluation unless otherwise noted.  DIAGNOSTIC FINDINGS:  MRI L Shoulder 05/19/23 IMPRESSION: 1. Mild supraspinatus tendinosis without a tear.  PATIENT SURVEYS :  PSFS: THE PATIENT SPECIFIC FUNCTIONAL SCALE  Place score of 0-10 (0 = unable to perform activity and 10 = able to perform activity at the same level as before injury or problem)  Activity Date: 11/28/23    Do hairstyle 0    2.Pick up heavy items 0    3.Sleep on arm  0    4. Lift arm up 0    Total Score 0      Total Score = Sum of activity scores/number of activities  Minimally Detectable Change: 3 points (for single activity); 2 points (for average score)  [facility] (nd). The Patient Specific Functional Scale . Retrieved from [email or web address]   COGNITION: Eval   Overall cognitive status: Within functional limits for tasks assessed     SENSATION: Eval: WFL  POSTURE: Eval L shoulder higher, rounded shoulders moderate  UPPER EXTREMITY ROM:    AROM/PROM Left eval Left 9/172025 Supine  12/08/2023  Shoulder flexion 22/90 115 PROM 124deg PROM  Shoulder extension 40/    Shoulder abduction 25/45 90 PROM 90deg PROM (painful)  Shoulder adduction     Shoulder internal rotation Crest/30 at 45    Shoulder external rotation 15/20 30 PROM in 30 deg abduction 20deg PROM in 30deg abduction   Elbow flexion     Elbow extension     Wrist flexion     Wrist extension     Wrist ulnar deviation     Wrist radial deviation     Wrist pronation     Wrist supination     (Blank rows = not tested)  UPPER EXTREMITY MMT:  MMT Left eval   Shoulder flexion 2+    Shoulder extension 3   Shoulder abduction 2+   Shoulder adduction    Shoulder internal rotation 3   Shoulder external rotation 3   Middle trapezius    Lower trapezius    Elbow flexion    Elbow extension    Wrist flexion    Wrist extension    Wrist ulnar deviation    Wrist radial deviation    Wrist pronation    Wrist supination    Grip strength (lbs)    (Blank rows = not tested)  SHOULDER SPECIAL TESTS: Eval No special tests due to post op status  JOINT MOBILITY TESTING:  Eval Decreased throughout   PALPATION:  Eval 2+ tenderness at incisions and mid deltoid.                    TREATMENT  DATE    12/13/23 Pulleys 3 min Seated thoracic rotation 20x Seated thoracic extension 20x Seated shrugs/pinches 20x each Supine wand flexion 2x10 Supine chest press/punch with wand 2x10 Manual PROM   TREATMENT        DATE: 12/08/2023 Manual:  Supine Lt shoulder PROM flexion, abduction with  grade 2 inferior glide, ER with attempted grade 1-2 posterior glide (discontinued glide due to discomfort) and very hesitant    TherEx:  Supine wand chest press with serratus punch 2x10  Supine wand shoulder flexion through very limited ROM due to pain 1x10  AAROM shoulder abduction with wand , discontinued after 2 reps secondary to pain  AAROM shoulder abduction with PT assist with wrist sup/pro performed at each max stretch 2x3  AAROM shoulder ER with wand at 45deg abduction and towel placed under arm, 2x12 with 3-5s hold    Vaso:  Lt shoulder supported with low compression at 34deg for 5 minutes (discontinued early due to MD appointment)    TREATMENT        DATE: 12/05/2023 Therapeutic Exercise: supine wand chest press with end range Serratus punch 10 reps 2 sets.  Supine wand flexion with end range deep breath stretch. 10 reps 2 sets.  Standing pendulum LUE start & end  small motion and progress to max tolerable 10 reps for flex/ext, abd/add and circles CW/CCW.  Pt demo understanding and reports decrease shoulder pain.  Passive range of motion for left shoulder flexion, internal and external rotation to tolerance.  Self-care: PT demo & verbal cues on positioning seated, standing and bed (supine & right side lying) so weight of arm is supported.  Pt verbalized understanding.  PT recommended wearing arm sling when going out in public as visual sign to protect her arm.  Pt verbalized understanding.     Vaso seated with left upper extremity supported medium compression 34*for 10 minutes into session for edema control  TREATMENT        DATE: 11/30/2023 Therex: Supine Wand AAROM flexion 2-3 sec hold 2 x 10  Supine wand AAROM protraction in 90 deg flexion 3 sec hold x 10  Supine wand Passive ER stretch with arm at side and towel under arm 2-3 sec hold x 10  UBE fwd/back 3 mins each way AAROM lvl 1.5 with 1 min rest between directions. Seated scapular retraction 5 sec hold x 10 Verbal cues for pendulums and grip squeeze at home.    Manual Supine Lt shoulder inferior glides g2 in flexion, scaption and abduction, Distraction with PROM at tolerance.  Posterior glide Lt GH joint with passive ER to tolerance.        TREATMENT        DATE: 11/28/23 Initial evaluation completed followed by instruction and trial of HEP for correct form and comprehension.   PATIENT EDUCATION: Education details: HEP Person educated: Patient Education method: Programmer, multimedia, Demonstration, Actor cues, and Verbal cues Education comprehension: verbalized understanding  HOME EXERCISE PROGRAM: Access Code: [email or web address] URL: [email or web address] Date: 11/28/2023 Prepared by: Burnard Meth  Exercises - Seated Scapular Retraction  - 2 x daily - 7 x weekly - 2 sets - 10 reps - Seated Shoulder Shrugs  - 2 x daily - 7 x weekly - 2 sets - 10 reps - Supine Shoulder Flexion  Extension AAROM with Dowel  - 2 x daily - 7 x weekly - 2 sets - 10 reps    ASSESSMENT:  CLINICAL IMPRESSION: Patient had better pain control today ( per patient and based  on previous note); which allowed for ability to increase flexion > 90 degrees with minimal pain.  Able to get through full program without increased pain.  OBJECTIVE IMPAIRMENTS: decreased ROM, decreased strength, hypomobility, impaired UE functional use, and pain.   ACTIVITY LIMITATIONS: carrying, lifting, sleeping, bathing, dressing, self feeding, and reach over head  PARTICIPATION LIMITATIONS: meal prep, cleaning, laundry, driving, and community activity  PERSONAL FACTORS: None- are also affecting patient's functional outcome.   REHAB POTENTIAL: Good  CLINICAL DECISION MAKING: Stable/uncomplicated  EVALUATION COMPLEXITY: Moderate  GOALS: Goals reviewed with patient? Yes  SHORT TERM GOALS: Target date: 12/19/2023    Pt to be independent with HEP. Baseline: Goal status: Ongoing   12/05/2023  2.  Decreased pain by 1 level. Baseline:  Goal status: Ongoing   12/05/2023   LONG TERM GOALS: Target date: 01/23/2024    Patient to be independent in self progressive HEP by discharge. Baseline:  Goal status: Ongoing   12/05/2023  2.  Increase active range of motion to WNL throughout. Baseline:  Goal status: Ongoing   12/05/2023  3.  Increase strength to at least 4/5 throughout shoulder. Baseline: 2+ > 3 Goal status: Ongoing   12/05/2023  4.  Reduce pain with all activities to 2 out of 10 or less. Baseline:  Goal status: Ongoing   12/05/2023  5.  Improve PSFS score by at least 2 full points for a measurable improvement. Baseline: 0 Goal status: Ongoing   12/05/2023  PLAN: PT FREQUENCY: 2x/week  PT DURATION: 8 weeks  PLANNED INTERVENTIONS: 97164- PT Re-evaluation, 97110-Therapeutic exercises, 97530- Therapeutic activity, 97112- Neuromuscular re-education, 97535- Self Care, 02859- Manual therapy, [id number]-  Aquatic Therapy, H9716- Electrical stimulation (unattended), 97016- Vasopneumatic device, [id number]- Ultrasound, [id number]- Traction (mechanical), [id number]- Ionotophoresis 4mg /ml Dexamethasone , Patient/Family education, Joint mobilization, Scar mobilization, Cryotherapy, and Moist heat  PLAN FOR NEXT SESSION:    AAROM, AROM progression, no strengthening or biceps loading per protocol.    Burnard Meth, PT 12/13/23  4:01 PM

## 2023-12-13 ENCOUNTER — Ambulatory Visit (INDEPENDENT_AMBULATORY_CARE_PROVIDER_SITE_OTHER)

## 2023-12-13 DIAGNOSIS — M25612 Stiffness of left shoulder, not elsewhere classified: Secondary | ICD-10-CM

## 2023-12-13 DIAGNOSIS — M6281 Muscle weakness (generalized): Secondary | ICD-10-CM

## 2023-12-13 DIAGNOSIS — M25512 Pain in left shoulder: Secondary | ICD-10-CM

## 2023-12-13 DIAGNOSIS — G8929 Other chronic pain: Secondary | ICD-10-CM

## 2023-12-14 NOTE — Therapy (Signed)
 OUTPATIENT PHYSICAL THERAPY TREATMENT   Patient Name: Emily Hester MRN: [id number] DOB:06/26/1970, 53 y.o., female Today's Date: 12/15/2023  END OF SESSION:  PT End of Session - 12/15/23 1659     Visit Number 6    Number of Visits 17    Date for Recertification  01/23/24    Authorization Type [employer]    PT Start Time 1345    PT Stop Time 1425    PT Time Calculation (min) 40 min    Activity Tolerance Patient tolerated treatment well    Behavior During Therapy WFL for tasks assessed/performed               Past Medical History:  Diagnosis Date   Arthritis    patient denies   Asthma    Back pain    Chest pain    Constipation    Diabetes mellitus without complication (HCC)    type    Food allergy    GERD (gastroesophageal reflux disease)    Heartburn    Hyperlipidemia    Hypertension    Interstitial cystitis    IUD (intrauterine device) in place placed 6 weeks ago   Joint pain    Lactose intolerance    Migraines    MIgraines- not current     OSA (obstructive sleep apnea)    Osteoarthritis    Palpitations    Pneumonia    SOB (shortness of breath)    Swallowing problem    Vitamin D  deficiency    Past Surgical History:  Procedure Laterality Date   ANTERIOR HIP REVISION Right 05/20/2020   Procedure: RIGHT TOTAL HIP REVISION- ploy exhange and hip ball exchange with bone graft;  Surgeon: Vernetta Lonni GRADE, MD;  Location: [facility];  Service: Orthopedics;  Laterality: Right;   DILATION AND EVACUATION  09/30/2011   Procedure: DILATATION AND EVACUATION;  Surgeon: Lynwood FORBES Curlene PONCE, MD;  Location: [facility];  Service: Gynecology;  Laterality: N/A;   left knee surgery      arthroscopy    TOTAL HIP ARTHROPLASTY Right 03/11/2017   Procedure: RIGHT TOTAL HIP ARTHROPLASTY ANTERIOR APPROACH;  Surgeon: Vernetta Lonni GRADE, MD;  Location: [facility];  Service: Orthopedics;  Laterality: Right;   Patient Active Problem List   Diagnosis Date Noted   BMI 40.0-44.9,  adult (HCC) current 40.05 10/20/2023   Vitamin D  deficiency 10/20/2023   Other fatigue 10/20/2023   Lactose intolerance 10/20/2023   DOE (dyspnea on exertion) 10/20/2023   Nut allergy 10/20/2023   Herpes simplex type 1 infection 10/13/2022   Chronic interstitial cystitis 10/13/2022   Morbid obesity (HCC) 10/13/2022   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HCC) 05/20/2020   Status post revision of total hip 05/20/2020   Trochanteric bursitis, right hip 09/05/2019   Hyperlipidemia 08/31/2019   Diabetes mellitus (HCC) 08/31/2019   Arthritis 08/31/2019   Overactive bladder 08/31/2019   Hypertensive disorder 08/30/2018   Asthma 08/30/2018   Migraine 08/30/2018   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   Unilateral primary osteoarthritis, right hip 01/27/2017   IUD (intrauterine device) in place 01/13/2017    PCP: [name], MD  REFERRING PROVIDER: Genelle Standing, MD  REFERRING DIAG: [phone] (ICD-10-CM) - Chronic left shoulder pain   THERAPY DIAG:  Chronic left shoulder pain  Stiffness of left shoulder, not elsewhere classified  Muscle weakness (generalized)  Rationale for Evaluation and Treatment: Rehabilitation  ONSET DATE:   11/24/2023 surgery   SUBJECTIVE:  SUBJECTIVE STATEMENT: Patient states she feels increased stiffness. Hand dominance: Right  PERTINENT HISTORY: L shoulder injection 06/15/23  PAIN:  NPRS scale:  5/10 today Pain location: left shoulder anterior Pain description: stinging, burning Aggravating factors: motion Relieving factors: ice  PRECAUTIONS: Lt Shoulder scope 11/24/23 AROM prn- follow biceps tenodesis protocol  RED FLAGS: None   WEIGHT BEARING RESTRICTIONS: No  FALLS:  Has patient fallen in last 6 months? Unknown  LIVING  ENVIRONMENT: Lives with: lives with their family Lives in: House/apartment Has following equipment at home: sling  OCCUPATION: Not working  PLOF: Independent  PATIENT GOALS: decrease pain  NEXT MD VISIT: 12/08/23  OBJECTIVE:  Note: Objective measures were completed at Evaluation unless otherwise noted.  DIAGNOSTIC FINDINGS:  MRI L Shoulder 05/19/23 IMPRESSION: 1. Mild supraspinatus tendinosis without a tear.  PATIENT SURVEYS :  PSFS: THE PATIENT SPECIFIC FUNCTIONAL SCALE  Place score of 0-10 (0 = unable to perform activity and 10 = able to perform activity at the same level as before injury or problem)  Activity Date: 11/28/23    Do hairstyle 0    2.Pick up heavy items 0    3.Sleep on arm  0    4. Lift arm up 0    Total Score 0      Total Score = Sum of activity scores/number of activities  Minimally Detectable Change: 3 points (for single activity); 2 points (for average score)  [facility] (nd). The Patient Specific Functional Scale . Retrieved from [email or web address]   COGNITION: Eval   Overall cognitive status: Within functional limits for tasks assessed     SENSATION: Eval: WFL  POSTURE: Eval L shoulder higher, rounded shoulders moderate  UPPER EXTREMITY ROM:    AROM/PROM Left eval Left 9/172025 Supine  12/08/2023 12/15/23   Shoulder flexion 22/90 115 PROM 124deg PROM 132 AA supine  Shoulder extension 40/     Shoulder abduction 25/45 90 PROM 90deg PROM (painful)   Shoulder adduction      Shoulder internal rotation Crest/30 at 45     Shoulder external rotation 15/20 30 PROM in 30 deg abduction 20deg PROM in 30deg abduction    Elbow flexion      Elbow extension      Wrist flexion      Wrist extension      Wrist ulnar deviation      Wrist radial deviation      Wrist pronation      Wrist supination      (Blank rows = not tested)  UPPER EXTREMITY MMT:  MMT Left eval   Shoulder  flexion 2+   Shoulder extension 3   Shoulder abduction 2+   Shoulder adduction    Shoulder internal rotation 3   Shoulder external rotation 3   Middle trapezius    Lower trapezius    Elbow flexion    Elbow extension    Wrist flexion    Wrist extension    Wrist ulnar deviation    Wrist radial deviation    Wrist pronation    Wrist supination    Grip strength (lbs)    (Blank rows = not tested)  SHOULDER SPECIAL TESTS: Eval No special tests due to post op status  JOINT MOBILITY TESTING:  Eval Decreased throughout   PALPATION:  Eval 2+ tenderness at incisions and mid deltoid.                    TREATMENT   LLShoulder  12/15/23 UBE no resistance 2 min each way Wall ladder 8x  (up to #21) Seated thoracic rotation 20x Seated thoracic extension 20x Seated shrugs/pinches 20x each Seated active cross opp shoulder touches 10x Supine wand flexion 2x10 Supine chest press/punch with wand 2x10 Manual PROM   DATE    12/13/23  Pulleys 3 min Seated thoracic rotation 20x Seated thoracic extension 20x Seated shrugs/pinches 20x each Supine wand flexion 2x10 Supine chest press/punch with wand 2x10 Manual PROM   TREATMENT        DATE: 12/08/2023 Manual:  Supine Lt shoulder PROM flexion, abduction with  grade 2 inferior glide, ER with attempted grade 1-2 posterior glide (discontinued glide due to discomfort) and very hesitant    TherEx:  Supine wand chest press with serratus punch 2x10  Supine wand shoulder flexion through very limited ROM due to pain 1x10  AAROM shoulder abduction with wand , discontinued after 2 reps secondary to pain  AAROM shoulder abduction with PT assist with wrist sup/pro performed at each max stretch 2x3  AAROM shoulder ER with wand at 45deg abduction and towel placed under arm, 2x12 with 3-5s hold    Vaso:  Lt shoulder supported with low  compression at 34deg for 5 minutes (discontinued early due to MD appointment)    TREATMENT        DATE: 12/05/2023 Therapeutic Exercise: supine wand chest press with end range Serratus punch 10 reps 2 sets.  Supine wand flexion with end range deep breath stretch. 10 reps 2 sets.  Standing pendulum LUE start & end small motion and progress to max tolerable 10 reps for flex/ext, abd/add and circles CW/CCW.  Pt demo understanding and reports decrease shoulder pain.  Passive range of motion for left shoulder flexion, internal and external rotation to tolerance.  Self-care: PT demo & verbal cues on positioning seated, standing and bed (supine & right side lying) so weight of arm is supported.  Pt verbalized understanding.  PT recommended wearing arm sling when going out in public as visual sign to protect her arm.  Pt verbalized understanding.     Vaso seated with left upper extremity supported medium compression 34*for 10 minutes into session for edema control  TREATMENT        DATE: 11/30/2023 Therex: Supine Wand AAROM flexion 2-3 sec hold 2 x 10  Supine wand AAROM protraction in 90 deg flexion 3 sec hold x 10  Supine wand Passive ER stretch with arm at side and towel under arm 2-3 sec hold x 10  UBE fwd/back 3 mins each way AAROM lvl 1.5 with 1 min rest between directions. Seated scapular retraction 5 sec hold x 10 Verbal cues for pendulums and grip squeeze at home.    Manual Supine Lt shoulder inferior glides g2 in flexion, scaption and abduction, Distraction with PROM at tolerance.  Posterior glide Lt GH joint with passive ER to tolerance.        TREATMENT        DATE: 11/28/23 Initial evaluation completed followed by instruction and trial of HEP for correct form and comprehension.   PATIENT EDUCATION: Education details: HEP Person educated: Patient Education method: Programmer, multimedia, Demonstration, Actor cues, and Verbal cues Education comprehension: verbalized  understanding  HOME EXERCISE PROGRAM: Access Code: [email or web address] URL: [email or web address] Date: 11/28/2023 Prepared by: Burnard Meth  Exercises - Seated Scapular Retraction  - 2 x daily - 7 x weekly - 2 sets - 10 reps - Seated Shoulder Shrugs  - 2 x daily - 7  x weekly - 2 sets - 10 reps - Supine Shoulder Flexion Extension AAROM with Dowel  - 2 x daily - 7 x weekly - 2 sets - 10 reps    ASSESSMENT:  CLINICAL IMPRESSION: Patient demos increased active and AA flexion today with minimal pain increase.  Flexion increased from 9/25 to today's measurement. OBJECTIVE IMPAIRMENTS: decreased ROM, decreased strength, hypomobility, impaired UE functional use, and pain.   ACTIVITY LIMITATIONS: carrying, lifting, sleeping, bathing, dressing, self feeding, and reach over head  PARTICIPATION LIMITATIONS: meal prep, cleaning, laundry, driving, and community activity  PERSONAL FACTORS: None- are also affecting patient's functional outcome.   REHAB POTENTIAL: Good  CLINICAL DECISION MAKING: Stable/uncomplicated  EVALUATION COMPLEXITY: Moderate  GOALS: Goals reviewed with patient? Yes  SHORT TERM GOALS: Target date: 12/19/2023    Pt to be independent with HEP. Baseline: Goal status: METR 12/15/23  2.  Decreased pain by 1 level. Baseline:  Goal status: Ongoing   12/15/23   LONG TERM GOALS: Target date: 01/23/2024    Patient to be independent in self progressive HEP by discharge. Baseline:  Goal status: Ongoing   12/05/2023  2.  Increase active range of motion to WNL throughout. Baseline:  Goal status: Ongoing   12/05/2023  3.  Increase strength to at least 4/5 throughout shoulder. Baseline: 2+ > 3 Goal status: Ongoing   12/05/2023  4.  Reduce pain with all activities to 2 out of 10 or less. Baseline:  Goal status: Ongoing   12/05/2023  5.  Improve PSFS score by at least 2 full points for a measurable improvement. Baseline: 0 Goal status: Ongoing    12/05/2023  PLAN: PT FREQUENCY: 2x/week  PT DURATION: 8 weeks  PLANNED INTERVENTIONS: 97164- PT Re-evaluation, 97110-Therapeutic exercises, 97530- Therapeutic activity, 97112- Neuromuscular re-education, 97535- Self Care, 02859- Manual therapy, [id number]- Aquatic Therapy, H9716- Electrical stimulation (unattended), 97016- Vasopneumatic device, [id number]- Ultrasound, [id number]- Traction (mechanical), [id number]- Ionotophoresis 4mg /ml Dexamethasone , Patient/Family education, Joint mobilization, Scar mobilization, Cryotherapy, and Moist heat  PLAN FOR NEXT SESSION:    AAROM, AROM progression, no strengthening or biceps loading per protocol.    Burnard Meth, PT 12/15/23  5:03 PM

## 2023-12-15 ENCOUNTER — Ambulatory Visit

## 2023-12-15 DIAGNOSIS — M6281 Muscle weakness (generalized): Secondary | ICD-10-CM

## 2023-12-15 DIAGNOSIS — M25612 Stiffness of left shoulder, not elsewhere classified: Secondary | ICD-10-CM | POA: Diagnosis not present

## 2023-12-15 DIAGNOSIS — M25512 Pain in left shoulder: Secondary | ICD-10-CM

## 2023-12-15 DIAGNOSIS — G8929 Other chronic pain: Secondary | ICD-10-CM

## 2023-12-19 ENCOUNTER — Encounter (HOSPITAL_BASED_OUTPATIENT_CLINIC_OR_DEPARTMENT_OTHER): Payer: Self-pay | Admitting: Orthopaedic Surgery

## 2023-12-19 ENCOUNTER — Other Ambulatory Visit (HOSPITAL_BASED_OUTPATIENT_CLINIC_OR_DEPARTMENT_OTHER): Payer: Self-pay

## 2023-12-19 ENCOUNTER — Other Ambulatory Visit: Payer: Self-pay | Admitting: Orthopaedic Surgery

## 2023-12-19 MED ORDER — IBUPROFEN 800 MG PO TABS
800.0000 mg | ORAL_TABLET | Freq: Three times a day (TID) | ORAL | 2 refills | Status: AC
  Filled 2023-12-19: qty 30, 10d supply, fill #0

## 2023-12-19 MED ORDER — ACETAMINOPHEN 500 MG PO TABS
500.0000 mg | ORAL_TABLET | Freq: Three times a day (TID) | ORAL | 2 refills | Status: AC
  Filled 2023-12-19: qty 30, 10d supply, fill #0

## 2023-12-19 NOTE — Therapy (Signed)
 OUTPATIENT PHYSICAL THERAPY TREATMENT   Patient Name: Emily Hester MRN: [id number] DOB:[birth year], 53 y.o., female Today's Date: 12/20/2023  END OF SESSION:  PT End of Session - 12/20/23 1120     Visit Number 7    Number of Visits 17    Date for Recertification  01/23/24    Authorization Type [employer]    PT Start Time 1100    PT Stop Time 1138    PT Time Calculation (min) 38 min    Activity Tolerance Patient tolerated treatment well    Behavior During Therapy WFL for tasks assessed/performed                Past Medical History:  Diagnosis Date   Arthritis    patient denies   Asthma    Back pain    Chest pain    Constipation    Diabetes mellitus without complication (HCC)    type    Food allergy    GERD (gastroesophageal reflux disease)    Heartburn    Hyperlipidemia    Hypertension    Interstitial cystitis    IUD (intrauterine device) in place placed 6 weeks ago   Joint pain    Lactose intolerance    Migraines    MIgraines- not current     OSA (obstructive sleep apnea)    Osteoarthritis    Palpitations    Pneumonia    SOB (shortness of breath)    Swallowing problem    Vitamin D  deficiency    Past Surgical History:  Procedure Laterality Date   ANTERIOR HIP REVISION Right 05/20/2020   Procedure: RIGHT TOTAL HIP REVISION- ploy exhange and hip ball exchange with bone graft;  Surgeon: Vernetta Lonni GRADE, MD;  Location: [facility];  Service: Orthopedics;  Laterality: Right;   DILATION AND EVACUATION  09/30/2011   Procedure: DILATATION AND EVACUATION;  Surgeon: Lynwood FORBES Curlene PONCE, MD;  Location: [facility];  Service: Gynecology;  Laterality: N/A;   left knee surgery      arthroscopy    TOTAL HIP ARTHROPLASTY Right 03/11/2017   Procedure: RIGHT TOTAL HIP ARTHROPLASTY ANTERIOR APPROACH;  Surgeon: Vernetta Lonni GRADE, MD;  Location: [facility];  Service: Orthopedics;  Laterality: Right;   Patient Active Problem List   Diagnosis Date Noted   BMI 40.0-44.9,  adult (HCC) current 40.05 10/20/2023   Vitamin D  deficiency 10/20/2023   Other fatigue 10/20/2023   Lactose intolerance 10/20/2023   DOE (dyspnea on exertion) 10/20/2023   Nut allergy 10/20/2023   Herpes simplex type 1 infection 10/13/2022   Chronic interstitial cystitis 10/13/2022   Morbid obesity (HCC) 10/13/2022   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HCC) 05/20/2020   Status post revision of total hip 05/20/2020   Trochanteric bursitis, right hip 09/05/2019   Hyperlipidemia 08/31/2019   Diabetes mellitus (HCC) 08/31/2019   Arthritis 08/31/2019   Overactive bladder 08/31/2019   Hypertensive disorder 08/30/2018   Asthma 08/30/2018   Migraine 08/30/2018   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   Unilateral primary osteoarthritis, right hip 01/27/2017   IUD (intrauterine device) in place 01/13/2017    PCP: [name], MD  REFERRING PROVIDER: Genelle Standing, MD  REFERRING DIAG: [phone] (ICD-10-CM) - Chronic left shoulder pain   THERAPY DIAG:  Chronic left shoulder pain  Stiffness of left shoulder, not elsewhere classified  Muscle weakness (generalized)  Rationale for Evaluation and Treatment: Rehabilitation  ONSET DATE:   11/24/2023 surgery   SUBJECTIVE:  SUBJECTIVE STATEMENT: Patient states she feels increased stiffness today. Hand dominance: Right  PERTINENT HISTORY: L shoulder injection 06/15/23  PAIN:   NPRS scale:  4.5/10 today Pain location: left shoulder anterior Pain description: stinging, burning Aggravating factors: motion Relieving factors: ice  PRECAUTIONS: Lt Shoulder scope 11/24/23 AROM prn- follow biceps tenodesis protocol  RED FLAGS: None   WEIGHT BEARING RESTRICTIONS: No  FALLS:  Has patient fallen in last 6 months? Unknown  LIVING  ENVIRONMENT: Lives with: lives with their family Lives in: House/apartment Has following equipment at home: sling  OCCUPATION: Not working  PLOF: Independent  PATIENT GOALS: decrease pain  NEXT MD VISIT: 12/08/23  OBJECTIVE:  Note: Objective measures were completed at Evaluation unless otherwise noted.  DIAGNOSTIC FINDINGS:  MRI L Shoulder 05/19/23 IMPRESSION: 1. Mild supraspinatus tendinosis without a tear.  PATIENT SURVEYS :  PSFS: THE PATIENT SPECIFIC FUNCTIONAL SCALE  Place score of 0-10 (0 = unable to perform activity and 10 = able to perform activity at the same level as before injury or problem)  Activity Date: 11/28/23    Do hairstyle 0    2.Pick up heavy items 0    3.Sleep on arm  0    4. Lift arm up 0    Total Score 0      Total Score = Sum of activity scores/number of activities  Minimally Detectable Change: 3 points (for single activity); 2 points (for average score)  [facility] (nd). The Patient Specific Functional Scale . Retrieved from [email or web address]   COGNITION: Eval   Overall cognitive status: Within functional limits for tasks assessed     SENSATION: Eval: WFL  POSTURE: Eval L shoulder higher, rounded shoulders moderate  UPPER EXTREMITY ROM:    AROM/PROM Left eval Left 9/172025 Supine  12/08/2023 12/15/23   Shoulder flexion 22/90 115 PROM 124deg PROM 132 AA supine  Shoulder extension 40/     Shoulder abduction 25/45 90 PROM 90deg PROM (painful)   Shoulder adduction      Shoulder internal rotation Crest/30 at 45     Shoulder external rotation 15/20 30 PROM in 30 deg abduction 20deg PROM in 30deg abduction    Elbow flexion      Elbow extension      Wrist flexion      Wrist extension      Wrist ulnar deviation      Wrist radial deviation      Wrist pronation      Wrist supination      (Blank rows = not tested)  UPPER EXTREMITY MMT:  MMT Left eval   Shoulder  flexion 2+   Shoulder extension 3   Shoulder abduction 2+   Shoulder adduction    Shoulder internal rotation 3   Shoulder external rotation 3   Middle trapezius    Lower trapezius    Elbow flexion    Elbow extension    Wrist flexion    Wrist extension    Wrist ulnar deviation    Wrist radial deviation    Wrist pronation    Wrist supination    Grip strength (lbs)    (Blank rows = not tested)  SHOULDER SPECIAL TESTS: Eval No special tests due to post op status  JOINT MOBILITY TESTING:  Eval Decreased throughout   PALPATION:  Eval 2+ tenderness at incisions and mid deltoid.                    TREATMENT   LShoulder  12/20/23 UBE no resistance 2 min each way Wall ladder 8x  (up to #23) Seated thoracic rotation 20x Seated thoracic extension 20x Seated shrugs/pinches 20x each Seated active cross opp shoulder touches 10x Supine wand flexion 2x10 Supine chest press/punch with wand 2x10 Supine punch with  Manual PROM     12/15/23 UBE no resistance 2 min each way Wall ladder 8x  (up to #21) Seated thoracic rotation 20x Seated thoracic extension 20x Seated shrugs/pinches 20x each Seated active cross opp shoulder touches 10x Supine wand flexion 2x10 Supine chest press/punch with wand 2x10 Manual PROM   DATE    12/13/23  Pulleys 3 min Seated thoracic rotation 20x Seated thoracic extension 20x Seated shrugs/pinches 20x each Supine wand flexion 2x10 Supine chest press/punch with wand 2x10 Manual PROM   TREATMENT        DATE: 12/08/2023 Manual:  Supine Lt shoulder PROM flexion, abduction with  grade 2 inferior glide, ER with attempted grade 1-2 posterior glide (discontinued glide due to discomfort) and very hesitant    TherEx:  Supine wand chest press with serratus punch 2x10  Supine wand shoulder flexion through very limited ROM due to pain 1x10  AAROM  shoulder abduction with wand , discontinued after 2 reps secondary to pain  AAROM shoulder abduction with PT assist with wrist sup/pro performed at each max stretch 2x3  AAROM shoulder ER with wand at 45deg abduction and towel placed under arm, 2x12 with 3-5s hold    Vaso:  Lt shoulder supported with low compression at 34deg for 5 minutes (discontinued early due to MD appointment)    TREATMENT        DATE: 12/05/2023 Therapeutic Exercise: supine wand chest press with end range Serratus punch 10 reps 2 sets.  Supine wand flexion with end range deep breath stretch. 10 reps 2 sets.  Standing pendulum LUE start & end small motion and progress to max tolerable 10 reps for flex/ext, abd/add and circles CW/CCW.  Pt demo understanding and reports decrease shoulder pain.  Passive range of motion for left shoulder flexion, internal and external rotation to tolerance.  Self-care: PT demo & verbal cues on positioning seated, standing and bed (supine & right side lying) so weight of arm is supported.  Pt verbalized understanding.  PT recommended wearing arm sling when going out in public as visual sign to protect her arm.  Pt verbalized understanding.     Vaso seated with left upper extremity supported medium compression 34*for 10 minutes into session for edema control  TREATMENT        DATE: 11/30/2023 Therex: Supine Wand AAROM flexion 2-3 sec hold 2 x 10  Supine wand AAROM protraction in 90 deg flexion 3 sec hold x 10  Supine wand Passive ER stretch with arm at side and towel under arm 2-3 sec hold x 10  UBE fwd/back 3 mins each way AAROM lvl 1.5 with 1 min rest between directions. Seated scapular retraction 5 sec hold x 10 Verbal cues for pendulums and grip squeeze at home.    Manual Supine Lt shoulder inferior glides g2 in flexion, scaption and abduction, Distraction with PROM at tolerance.  Posterior glide Lt GH joint with passive ER to tolerance.        TREATMENT        DATE:  11/28/23 Initial evaluation completed followed by instruction and trial of HEP for correct form and comprehension.   PATIENT EDUCATION: Education details: HEP Person educated: Patient Education method: Explanation, Demonstration, Tactile cues,  and Verbal cues Education comprehension: verbalized understanding  HOME EXERCISE PROGRAM: Access Code: [email or web address] URL: [email or web address] Date: 11/28/2023 Prepared by: Burnard Meth  Exercises - Seated Scapular Retraction  - 2 x daily - 7 x weekly - 2 sets - 10 reps - Seated Shoulder Shrugs  - 2 x daily - 7 x weekly - 2 sets - 10 reps - Supine Shoulder Flexion Extension AAROM with Dowel  - 2 x daily - 7 x weekly - 2 sets - 10 reps    ASSESSMENT:  CLINICAL IMPRESSION: Patient needed VC for new punching exercise.  Demonstrated good form.   OBJECTIVE IMPAIRMENTS: decreased ROM, decreased strength, hypomobility, impaired UE functional use, and pain.   ACTIVITY LIMITATIONS: carrying, lifting, sleeping, bathing, dressing, self feeding, and reach over head  PARTICIPATION LIMITATIONS: meal prep, cleaning, laundry, driving, and community activity  PERSONAL FACTORS: None- are also affecting patient's functional outcome.   REHAB POTENTIAL: Good  CLINICAL DECISION MAKING: Stable/uncomplicated  EVALUATION COMPLEXITY: Moderate  GOALS: Goals reviewed with patient? Yes  SHORT TERM GOALS: Target date: 12/19/2023    Pt to be independent with HEP. Baseline: Goal status: METR 12/15/23  2.  Decreased pain by 1 level. Baseline:  Goal status: Ongoing   12/15/23   LONG TERM GOALS: Target date: 01/23/2024    Patient to be independent in self progressive HEP by discharge. Baseline:  Goal status: Ongoing   12/05/2023  2.  Increase active range of motion to WNL throughout. Baseline:  Goal status: Ongoing   12/05/2023  3.  Increase strength to at least 4/5 throughout shoulder. Baseline: 2+ > 3 Goal status: Ongoing    12/05/2023  4.  Reduce pain with all activities to 2 out of 10 or less. Baseline:  Goal status: Ongoing   12/05/2023  5.  Improve PSFS score by at least 2 full points for a measurable improvement. Baseline: 0 Goal status: Ongoing   12/05/2023  PLAN: PT FREQUENCY: 2x/week  PT DURATION: 8 weeks  PLANNED INTERVENTIONS: 97164- PT Re-evaluation, 97110-Therapeutic exercises, 97530- Therapeutic activity, 97112- Neuromuscular re-education, 97535- Self Care, [id number]- Manual therapy, [id number]- Aquatic Therapy, [id number]- Electrical stimulation (unattended), 97016- Vasopneumatic device, [id number]- Ultrasound, [id number]- Traction (mechanical), [id number]- Ionotophoresis 4mg /ml Dexamethasone , Patient/Family education, Joint mobilization, Scar mobilization, Cryotherapy, and Moist heat  PLAN FOR NEXT SESSION:    AAROM, AROM progression, no strengthening or biceps loading per protocol.    Burnard Meth, PT 12/20/23  11:39 AM

## 2023-12-20 ENCOUNTER — Ambulatory Visit (INDEPENDENT_AMBULATORY_CARE_PROVIDER_SITE_OTHER)

## 2023-12-20 DIAGNOSIS — M6281 Muscle weakness (generalized): Secondary | ICD-10-CM

## 2023-12-20 DIAGNOSIS — M25612 Stiffness of left shoulder, not elsewhere classified: Secondary | ICD-10-CM

## 2023-12-20 DIAGNOSIS — G8929 Other chronic pain: Secondary | ICD-10-CM

## 2023-12-20 DIAGNOSIS — M25512 Pain in left shoulder: Secondary | ICD-10-CM

## 2023-12-21 NOTE — Therapy (Signed)
 OUTPATIENT PHYSICAL THERAPY TREATMENT   Patient Name: Emily Hester MRN: [id number] DOB:[birth year], 53 y.o., female Today's Date: 12/22/2023  END OF SESSION:  PT End of Session - 12/22/23 1144     Visit Number 8    Number of Visits 17    Date for Recertification  01/23/24    Authorization Type [employer]    PT Start Time 1100    PT Stop Time 1138    PT Time Calculation (min) 38 min    Activity Tolerance Patient tolerated treatment well    Behavior During Therapy WFL for tasks assessed/performed                 Past Medical History:  Diagnosis Date   Arthritis    patient denies   Asthma    Back pain    Chest pain    Constipation    Diabetes mellitus without complication (HCC)    type    Food allergy    GERD (gastroesophageal reflux disease)    Heartburn    Hyperlipidemia    Hypertension    Interstitial cystitis    IUD (intrauterine device) in place placed 6 weeks ago   Joint pain    Lactose intolerance    Migraines    MIgraines- not current     OSA (obstructive sleep apnea)    Osteoarthritis    Palpitations    Pneumonia    SOB (shortness of breath)    Swallowing problem    Vitamin D  deficiency    Past Surgical History:  Procedure Laterality Date   ANTERIOR HIP REVISION Right 05/20/2020   Procedure: RIGHT TOTAL HIP REVISION- ploy exhange and hip ball exchange with bone graft;  Surgeon: Vernetta Lonni GRADE, MD;  Location: [facility];  Service: Orthopedics;  Laterality: Right;   DILATION AND EVACUATION  09/30/2011   Procedure: DILATATION AND EVACUATION;  Surgeon: Lynwood FORBES Curlene PONCE, MD;  Location: [facility];  Service: Gynecology;  Laterality: N/A;   left knee surgery      arthroscopy    TOTAL HIP ARTHROPLASTY Right 03/11/2017   Procedure: RIGHT TOTAL HIP ARTHROPLASTY ANTERIOR APPROACH;  Surgeon: Vernetta Lonni GRADE, MD;  Location: [facility];  Service: Orthopedics;  Laterality: Right;   Patient Active Problem List   Diagnosis Date Noted   BMI 40.0-44.9,  adult (HCC) current 40.05 10/20/2023   Vitamin D  deficiency 10/20/2023   Other fatigue 10/20/2023   Lactose intolerance 10/20/2023   DOE (dyspnea on exertion) 10/20/2023   Nut allergy 10/20/2023   Herpes simplex type 1 infection 10/13/2022   Chronic interstitial cystitis 10/13/2022   Morbid obesity (HCC) 10/13/2022   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HCC) 05/20/2020   Status post revision of total hip 05/20/2020   Trochanteric bursitis, right hip 09/05/2019   Hyperlipidemia 08/31/2019   Diabetes mellitus (HCC) 08/31/2019   Arthritis 08/31/2019   Overactive bladder 08/31/2019   Hypertensive disorder 08/30/2018   Asthma 08/30/2018   Migraine 08/30/2018   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   Unilateral primary osteoarthritis, right hip 01/27/2017   IUD (intrauterine device) in place 01/13/2017    PCP: [name], MD  REFERRING PROVIDER: Genelle Standing, MD  REFERRING DIAG: [phone] (ICD-10-CM) - Chronic left shoulder pain   THERAPY DIAG:  Stiffness of left shoulder, not elsewhere classified  Muscle weakness (generalized)  Chronic left shoulder pain  Rationale for Evaluation and Treatment: Rehabilitation  ONSET DATE:   11/24/2023 surgery   SUBJECTIVE:  SUBJECTIVE STATEMENT: Patient states the shoulder feels achy. Hand dominance: Right  PERTINENT HISTORY: L shoulder injection 06/15/23  PAIN:   NPRS scale:  5.5/10 today Pain location: left shoulder anterior Pain description: stinging, burning Aggravating factors: motion Relieving factors: ice  PRECAUTIONS: Lt Shoulder scope 11/24/23 AROM prn- follow biceps tenodesis protocol  RED FLAGS: None   WEIGHT BEARING RESTRICTIONS: No  FALLS:  Has patient fallen in last 6 months? Unknown  LIVING  ENVIRONMENT: Lives with: lives with their family Lives in: House/apartment Has following equipment at home: sling  OCCUPATION: Not working  PLOF: Independent  PATIENT GOALS: decrease pain  NEXT MD VISIT: 12/08/23  OBJECTIVE:  Note: Objective measures were completed at Evaluation unless otherwise noted.  DIAGNOSTIC FINDINGS:  MRI L Shoulder 05/19/23 IMPRESSION: 1. Mild supraspinatus tendinosis without a tear.  PATIENT SURVEYS :  PSFS: THE PATIENT SPECIFIC FUNCTIONAL SCALE  Place score of 0-10 (0 = unable to perform activity and 10 = able to perform activity at the same level as before injury or problem)  Activity Date: 11/28/23    Do hairstyle 0    2.Pick up heavy items 0    3.Sleep on arm  0    4. Lift arm up 0    Total Score 0      Total Score = Sum of activity scores/number of activities  Minimally Detectable Change: 3 points (for single activity); 2 points (for average score)  [facility] (nd). The Patient Specific Functional Scale . Retrieved from [email or web address]   COGNITION: Eval   Overall cognitive status: Within functional limits for tasks assessed     SENSATION: Eval: WFL  POSTURE: Eval L shoulder higher, rounded shoulders moderate  UPPER EXTREMITY ROM:    AROM/PROM Left eval Left 9/172025 Supine  12/08/2023 12/15/23   Shoulder flexion 22/90 115 PROM 124deg PROM 132 AA supine  Shoulder extension 40/     Shoulder abduction 25/45 90 PROM 90deg PROM (painful)   Shoulder adduction      Shoulder internal rotation Crest/30 at 45     Shoulder external rotation 15/20 30 PROM in 30 deg abduction 20deg PROM in 30deg abduction    Elbow flexion      Elbow extension      Wrist flexion      Wrist extension      Wrist ulnar deviation      Wrist radial deviation      Wrist pronation      Wrist supination      (Blank rows = not tested)  UPPER EXTREMITY MMT:  MMT Left eval   Shoulder  flexion 2+   Shoulder extension 3   Shoulder abduction 2+   Shoulder adduction    Shoulder internal rotation 3   Shoulder external rotation 3   Middle trapezius    Lower trapezius    Elbow flexion    Elbow extension    Wrist flexion    Wrist extension    Wrist ulnar deviation    Wrist radial deviation    Wrist pronation    Wrist supination    Grip strength (lbs)    (Blank rows = not tested)  SHOULDER SPECIAL TESTS: Eval No special tests due to post op status  JOINT MOBILITY TESTING:  Eval Decreased throughout   PALPATION:  Eval 2+ tenderness at incisions and mid deltoid.                    TREATMENT   LShoulder  12/22/23 UBE no resistance 2 min each way Wall ladder 8x  (up to #23) Seated thoracic rotation 20x Seated thoracic extension 20x Seated active cross opp shoulder touches 10x Supine wand flexion/abduction  2x10  Manual PROM/scap mobs  12/20/23 UBE no resistance 2 min each way Wall ladder 8x  (up to #23) Seated thoracic rotation 20x Seated thoracic extension 20x Seated shrugs/pinches 20x each Seated active cross opp shoulder touches 10x Supine wand flexion 2x10 Supine chest press/punch with wand 2x10 Supine punch  Manual PROM  12/15/23 UBE no resistance 2 min each way Wall ladder 8x  (up to #21) Seated thoracic rotation 20x Seated thoracic extension 20x Seated shrugs/pinches 20x each Seated active cross opp shoulder touches 10x Supine wand flexion 2x10 Supine chest press/punch with wand 2x10 Manual PROM   DATE    12/13/23  Pulleys 3 min Seated thoracic rotation 20x Seated thoracic extension 20x Seated shrugs/pinches 20x each Supine wand flexion 2x10 Supine chest press/punch with wand 2x10 Manual PROM   TREATMENT        DATE: 12/08/2023 Manual:  Supine Lt shoulder PROM flexion, abduction with  grade 2 inferior glide, ER with  attempted grade 1-2 posterior glide (discontinued glide due to discomfort) and very hesitant    TherEx:  Supine wand chest press with serratus punch 2x10  Supine wand shoulder flexion through very limited ROM due to pain 1x10  AAROM shoulder abduction with wand , discontinued after 2 reps secondary to pain  AAROM shoulder abduction with PT assist with wrist sup/pro performed at each max stretch 2x3  AAROM shoulder ER with wand at 45deg abduction and towel placed under arm, 2x12 with 3-5s hold    Vaso:  Lt shoulder supported with low compression at 34deg for 5 minutes (discontinued early due to MD appointment)    TREATMENT        DATE: 12/05/2023 Therapeutic Exercise: supine wand chest press with end range Serratus punch 10 reps 2 sets.  Supine wand flexion with end range deep breath stretch. 10 reps 2 sets.  Standing pendulum LUE start & end small motion and progress to max tolerable 10 reps for flex/ext, abd/add and circles CW/CCW.  Pt demo understanding and reports decrease shoulder pain.  Passive range of motion for left shoulder flexion, internal and external rotation to tolerance.  Self-care: PT demo & verbal cues on positioning seated, standing and bed (supine & right side lying) so weight of arm is supported.  Pt verbalized understanding.  PT recommended wearing arm sling when going out in public as visual sign to protect her arm.  Pt verbalized understanding.     Vaso seated with left upper extremity supported medium compression 34*for 10 minutes into session for edema control  TREATMENT        DATE: 11/30/2023 Therex: Supine Wand AAROM flexion 2-3 sec hold 2 x 10  Supine wand AAROM protraction in 90 deg flexion 3 sec hold x 10  Supine wand Passive ER stretch with arm at side and towel under arm 2-3 sec hold x 10  UBE fwd/back 3 mins each way AAROM lvl 1.5 with 1 min rest between directions. Seated scapular retraction 5 sec hold x 10 Verbal cues for pendulums and grip squeeze  at home.    Manual Supine Lt shoulder inferior glides g2 in flexion, scaption and abduction, Distraction with PROM at tolerance.  Posterior glide Lt GH joint with passive ER to tolerance.        TREATMENT  DATE: 11/28/23 Initial evaluation completed followed by instruction and trial of HEP for correct form and comprehension.   PATIENT EDUCATION: Education details: HEP Person educated: Patient Education method: Programmer, multimedia, Demonstration, Actor cues, and Verbal cues Education comprehension: verbalized understanding  HOME EXERCISE PROGRAM: Access Code: [email or web address] URL: [email or web address] Date: 11/28/2023 Prepared by: Burnard Meth  Exercises - Seated Scapular Retraction  - 2 x daily - 7 x weekly - 2 sets - 10 reps - Seated Shoulder Shrugs  - 2 x daily - 7 x weekly - 2 sets - 10 reps - Supine Shoulder Flexion Extension AAROM with Dowel  - 2 x daily - 7 x weekly - 2 sets - 10 reps    ASSESSMENT:  CLINICAL IMPRESSION: Pt presents with increased pain with motion.  AROM/PROM limited more than last visit.   OBJECTIVE IMPAIRMENTS: decreased ROM, decreased strength, hypomobility, impaired UE functional use, and pain.   ACTIVITY LIMITATIONS: carrying, lifting, sleeping, bathing, dressing, self feeding, and reach over head  PARTICIPATION LIMITATIONS: meal prep, cleaning, laundry, driving, and community activity  PERSONAL FACTORS: None- are also affecting patient's functional outcome.   REHAB POTENTIAL: Good  CLINICAL DECISION MAKING: Stable/uncomplicated  EVALUATION COMPLEXITY: Moderate  GOALS: Goals reviewed with patient? Yes  SHORT TERM GOALS: Target date: 12/19/2023    Pt to be independent with HEP. Baseline: Goal status: METR 12/15/23  2.  Decreased pain by 1 level. Baseline:  Goal status: Ongoing   12/15/23   LONG TERM GOALS: Target date: 01/23/2024    Patient to be independent in self progressive HEP by discharge. Baseline:  Goal  status: Ongoing   12/05/2023  2.  Increase active range of motion to WNL throughout. Baseline:  Goal status: Ongoing   12/05/2023  3.  Increase strength to at least 4/5 throughout shoulder. Baseline: 2+ > 3 Goal status: Ongoing   12/05/2023  4.  Reduce pain with all activities to 2 out of 10 or less. Baseline:  Goal status: Ongoing   12/05/2023  5.  Improve PSFS score by at least 2 full points for a measurable improvement. Baseline: 0 Goal status: Ongoing   12/05/2023  PLAN: PT FREQUENCY: 2x/week  PT DURATION: 8 weeks  PLANNED INTERVENTIONS: 97164- PT Re-evaluation, 97110-Therapeutic exercises, 97530- Therapeutic activity, 97112- Neuromuscular re-education, 97535- Self Care, 02859- Manual therapy, [id number]- Aquatic Therapy, H9716- Electrical stimulation (unattended), 97016- Vasopneumatic device, [id number]- Ultrasound, [id number]- Traction (mechanical), [id number]- Ionotophoresis 4mg /ml Dexamethasone , Patient/Family education, Joint mobilization, Scar mobilization, Cryotherapy, and Moist heat  PLAN FOR NEXT SESSION:    AAROM, AROM progression, no strengthening or biceps loading per protocol.    Burnard Meth, PT 12/22/23  11:45 AM

## 2023-12-22 ENCOUNTER — Other Ambulatory Visit (HOSPITAL_BASED_OUTPATIENT_CLINIC_OR_DEPARTMENT_OTHER): Payer: Self-pay

## 2023-12-22 ENCOUNTER — Other Ambulatory Visit: Payer: Self-pay

## 2023-12-22 ENCOUNTER — Encounter (INDEPENDENT_AMBULATORY_CARE_PROVIDER_SITE_OTHER): Payer: Self-pay | Admitting: Family Medicine

## 2023-12-22 ENCOUNTER — Ambulatory Visit (INDEPENDENT_AMBULATORY_CARE_PROVIDER_SITE_OTHER)

## 2023-12-22 DIAGNOSIS — M25612 Stiffness of left shoulder, not elsewhere classified: Secondary | ICD-10-CM

## 2023-12-22 DIAGNOSIS — M25512 Pain in left shoulder: Secondary | ICD-10-CM

## 2023-12-22 DIAGNOSIS — G8929 Other chronic pain: Secondary | ICD-10-CM

## 2023-12-22 DIAGNOSIS — M6281 Muscle weakness (generalized): Secondary | ICD-10-CM

## 2023-12-22 MED ORDER — VITAMIN D3 50 MCG (2000 UT) PO TABS
2000.0000 [IU] | ORAL_TABLET | Freq: Every day | ORAL | 0 refills | Status: DC
  Filled 2023-12-22: qty 30, 30d supply, fill #0

## 2023-12-26 ENCOUNTER — Encounter (HOSPITAL_BASED_OUTPATIENT_CLINIC_OR_DEPARTMENT_OTHER): Payer: Self-pay | Admitting: Orthopaedic Surgery

## 2023-12-26 ENCOUNTER — Other Ambulatory Visit (HOSPITAL_BASED_OUTPATIENT_CLINIC_OR_DEPARTMENT_OTHER): Payer: Self-pay | Admitting: Orthopaedic Surgery

## 2023-12-26 MED ORDER — MELOXICAM 15 MG PO TABS: 15.0000 mg | ORAL_TABLET | Freq: Every day | ORAL | 0 refills | Status: AC

## 2023-12-26 NOTE — Therapy (Incomplete)
 OUTPATIENT PHYSICAL THERAPY TREATMENT   Patient Name: Emily Hester MRN: [id number] DOB:06/18/1970, 53 y.o., female Today's Date: 12/26/2023  END OF SESSION:           Past Medical History:  Diagnosis Date   Arthritis    patient denies   Asthma    Back pain    Chest pain    Constipation    Diabetes mellitus without complication (HCC)    type    Food allergy    GERD (gastroesophageal reflux disease)    Heartburn    Hyperlipidemia    Hypertension    Interstitial cystitis    IUD (intrauterine device) in place placed 6 weeks ago   Joint pain    Lactose intolerance    Migraines    MIgraines- not current     OSA (obstructive sleep apnea)    Osteoarthritis    Palpitations    Pneumonia    SOB (shortness of breath)    Swallowing problem    Vitamin D  deficiency    Past Surgical History:  Procedure Laterality Date   ANTERIOR HIP REVISION Right 05/20/2020   Procedure: RIGHT TOTAL HIP REVISION- ploy exhange and hip ball exchange with bone graft;  Surgeon: Vernetta Lonni GRADE, MD;  Location: [facility];  Service: Orthopedics;  Laterality: Right;   DILATION AND EVACUATION  09/30/2011   Procedure: DILATATION AND EVACUATION;  Surgeon: Lynwood FORBES Curlene PONCE, MD;  Location: [facility];  Service: Gynecology;  Laterality: N/A;   left knee surgery      arthroscopy    TOTAL HIP ARTHROPLASTY Right 03/11/2017   Procedure: RIGHT TOTAL HIP ARTHROPLASTY ANTERIOR APPROACH;  Surgeon: Vernetta Lonni GRADE, MD;  Location: [facility];  Service: Orthopedics;  Laterality: Right;   Patient Active Problem List   Diagnosis Date Noted   BMI 40.0-44.9, adult (HCC) current 40.05 10/20/2023   Vitamin D  deficiency 10/20/2023   Other fatigue 10/20/2023   Lactose intolerance 10/20/2023   DOE (dyspnea on exertion) 10/20/2023   Nut allergy 10/20/2023   Herpes simplex type 1 infection 10/13/2022   Chronic interstitial cystitis 10/13/2022   Morbid obesity (HCC) 10/13/2022   Loose right total hip  arthroplasty (HCC) 05/20/2020   Status post revision of total hip 05/20/2020   Trochanteric bursitis, right hip 09/05/2019   Hyperlipidemia 08/31/2019   Diabetes mellitus (HCC) 08/31/2019   Arthritis 08/31/2019   Overactive bladder 08/31/2019   Hypertensive disorder 08/30/2018   Asthma 08/30/2018   Migraine 08/30/2018   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   Unilateral primary osteoarthritis, right hip 01/27/2017   IUD (intrauterine device) in place 01/13/2017    PCP: [name], MD  REFERRING PROVIDER: [name], MD  REFERRING DIAG: [phone] (ICD-10-CM) - Chronic left shoulder pain   THERAPY DIAG:  No diagnosis found.  Rationale for Evaluation and Treatment: Rehabilitation  ONSET DATE:   11/24/2023 surgery   SUBJECTIVE:  SUBJECTIVE STATEMENT: ***Patient states the shoulder feels achy. Hand dominance: Right  PERTINENT HISTORY: L shoulder injection 06/15/23  PAIN:   ***NPRS scale:  5.5/10 today Pain location: left shoulder anterior Pain description: stinging, burning Aggravating factors: motion Relieving factors: ice  PRECAUTIONS: Lt Shoulder scope 11/24/23 AROM prn- follow biceps tenodesis protocol  RED FLAGS: None   WEIGHT BEARING RESTRICTIONS: No  FALLS:  Has patient fallen in last 6 months? Unknown  LIVING ENVIRONMENT: Lives with: lives with their family Lives in: House/apartment Has following equipment at home: sling  OCCUPATION: Not working  PLOF: Independent  PATIENT GOALS: decrease pain  NEXT MD VISIT: 12/08/23  OBJECTIVE:  Note: Objective measures were completed at Evaluation unless otherwise noted.  DIAGNOSTIC FINDINGS:  MRI L Shoulder 05/19/23 IMPRESSION: 1. Mild supraspinatus tendinosis without a tear.  PATIENT SURVEYS :   PSFS: THE PATIENT SPECIFIC FUNCTIONAL SCALE  Place score of 0-10 (0 = unable to perform activity and 10 = able to perform activity at the same level as before injury or problem)  Activity Date: 11/28/23    Do hairstyle 0    2.Pick up heavy items 0    3.Sleep on arm  0    4. Lift arm up 0    Total Score 0      Total Score = Sum of activity scores/number of activities  Minimally Detectable Change: 3 points (for single activity); 2 points (for average score)  [facility] (nd). The Patient Specific Functional Scale . Retrieved from [email or web address]   COGNITION: Eval   Overall cognitive status: Within functional limits for tasks assessed     SENSATION: Eval: WFL  POSTURE: Eval L shoulder higher, rounded shoulders moderate  UPPER EXTREMITY ROM:    AROM/PROM Left eval Left 9/172025 Supine  12/08/2023 12/15/23   Shoulder flexion 22/90 115 PROM 124deg PROM 132 AA supine  Shoulder extension 40/     Shoulder abduction 25/45 90 PROM 90deg PROM (painful)   Shoulder adduction      Shoulder internal rotation Crest/30 at 45     Shoulder external rotation 15/20 30 PROM in 30 deg abduction 20deg PROM in 30deg abduction    Elbow flexion      Elbow extension      Wrist flexion      Wrist extension      Wrist ulnar deviation      Wrist radial deviation      Wrist pronation      Wrist supination      (Blank rows = not tested)  UPPER EXTREMITY MMT:  MMT Left eval   Shoulder flexion 2+   Shoulder extension 3   Shoulder abduction 2+   Shoulder adduction    Shoulder internal rotation 3   Shoulder external rotation 3   Middle trapezius    Lower trapezius    Elbow flexion    Elbow extension    Wrist flexion    Wrist extension    Wrist ulnar deviation    Wrist radial deviation    Wrist pronation    Wrist supination    Grip strength (lbs)    (Blank rows = not tested)  SHOULDER SPECIAL TESTS: Eval No  special tests due to post op status  JOINT MOBILITY TESTING:  Eval Decreased throughout   PALPATION:  Eval 2+ tenderness at incisions and mid deltoid.                    TREATMENT   LShoulder  12/27/23***     12/22/23 UBE no resistance 2 min each way Wall ladder 8x  (up to #23) Seated thoracic rotation 20x Seated thoracic extension 20x Seated active cross opp shoulder touches 10x Supine wand flexion/abduction  2x10  Manual PROM/scap mobs  12/20/23 UBE no resistance 2 min each way Wall ladder 8x  (up to #23) Seated thoracic rotation 20x Seated thoracic extension 20x Seated shrugs/pinches 20x each Seated active cross opp shoulder touches 10x Supine wand flexion 2x10 Supine chest press/punch with wand 2x10 Supine punch  Manual PROM  12/15/23 UBE no resistance 2 min each way Wall ladder 8x  (up to #21) Seated thoracic rotation 20x Seated thoracic extension 20x Seated shrugs/pinches 20x each Seated active cross opp shoulder touches 10x Supine wand flexion 2x10 Supine chest press/punch with wand 2x10 Manual PROM   DATE    12/13/23  Pulleys 3 min Seated thoracic rotation 20x Seated thoracic extension 20x Seated shrugs/pinches 20x each Supine wand flexion 2x10 Supine chest press/punch with wand 2x10 Manual PROM   TREATMENT        DATE: 12/08/2023 Manual:  Supine Lt shoulder PROM flexion, abduction with  grade 2 inferior glide, ER with attempted grade 1-2 posterior glide (discontinued glide due to discomfort) and very hesitant    TherEx:  Supine wand chest press with serratus punch 2x10  Supine wand shoulder flexion through very limited ROM due to pain 1x10  AAROM shoulder abduction with wand , discontinued after 2 reps secondary to pain  AAROM shoulder abduction with PT assist with wrist sup/pro performed at each max stretch 2x3  AAROM shoulder ER  with wand at 45deg abduction and towel placed under arm, 2x12 with 3-5s hold    Vaso:  Lt shoulder supported with low compression at 34deg for 5 minutes (discontinued early due to MD appointment)    TREATMENT        DATE: 12/05/2023 Therapeutic Exercise: supine wand chest press with end range Serratus punch 10 reps 2 sets.  Supine wand flexion with end range deep breath stretch. 10 reps 2 sets.  Standing pendulum LUE start & end small motion and progress to max tolerable 10 reps for flex/ext, abd/add and circles CW/CCW.  Pt demo understanding and reports decrease shoulder pain.  Passive range of motion for left shoulder flexion, internal and external rotation to tolerance.  Self-care: PT demo & verbal cues on positioning seated, standing and bed (supine & right side lying) so weight of arm is supported.  Pt verbalized understanding.  PT recommended wearing arm sling when going out in public as visual sign to protect her arm.  Pt verbalized understanding.     Vaso seated with left upper extremity supported medium compression 34*for 10 minutes into session for edema control  TREATMENT        DATE: 11/30/2023 Therex: Supine Wand AAROM flexion 2-3 sec hold 2 x 10  Supine wand AAROM protraction in 90 deg flexion 3 sec hold x 10  Supine wand Passive ER stretch with arm at side and towel under arm 2-3 sec hold x 10  UBE fwd/back 3 mins each way AAROM lvl 1.5 with 1 min rest between directions. Seated scapular retraction 5 sec hold x 10 Verbal cues for pendulums and grip squeeze at home.    Manual Supine Lt shoulder inferior glides g2 in flexion, scaption and abduction, Distraction with PROM at tolerance.  Posterior glide Lt GH joint with passive ER to tolerance.        TREATMENT  DATE: 11/28/23 Initial evaluation completed followed by instruction and trial of HEP for correct form and comprehension.   PATIENT EDUCATION: Education details: HEP Person educated:  Patient Education method: Programmer, multimedia, Demonstration, Actor cues, and Verbal cues Education comprehension: verbalized understanding  HOME EXERCISE PROGRAM: Access Code: [email or web address] URL: [email or web address] Date: 11/28/2023 Prepared by: Burnard Meth  Exercises - Seated Scapular Retraction  - 2 x daily - 7 x weekly - 2 sets - 10 reps - Seated Shoulder Shrugs  - 2 x daily - 7 x weekly - 2 sets - 10 reps - Supine Shoulder Flexion Extension AAROM with Dowel  - 2 x daily - 7 x weekly - 2 sets - 10 reps    ASSESSMENT:  CLINICAL IMPRESSION: ***Pt presents with increased pain with motion.  AROM/PROM limited more than last visit.   OBJECTIVE IMPAIRMENTS: decreased ROM, decreased strength, hypomobility, impaired UE functional use, and pain.   ACTIVITY LIMITATIONS: carrying, lifting, sleeping, bathing, dressing, self feeding, and reach over head  PARTICIPATION LIMITATIONS: meal prep, cleaning, laundry, driving, and community activity  PERSONAL FACTORS: None- are also affecting patient's functional outcome.   REHAB POTENTIAL: Good  CLINICAL DECISION MAKING: Stable/uncomplicated  EVALUATION COMPLEXITY: Moderate  GOALS: Goals reviewed with patient? Yes  SHORT TERM GOALS: Target date: 12/19/2023    Pt to be independent with HEP. Baseline: Goal status: METR 12/15/23  2.  Decreased pain by 1 level. Baseline:  Goal status: Ongoing   12/15/23   LONG TERM GOALS: Target date: 01/23/2024    Patient to be independent in self progressive HEP by discharge. Baseline:  Goal status: Ongoing   12/05/2023  2.  Increase active range of motion to WNL throughout. Baseline:  Goal status: Ongoing   12/05/2023  3.  Increase strength to at least 4/5 throughout shoulder. Baseline: 2+ > 3 Goal status: Ongoing   12/05/2023  4.  Reduce pain with all activities to 2 out of 10 or less. Baseline:  Goal status: Ongoing   12/05/2023  5.  Improve PSFS score by at least 2 full points for  a measurable improvement. Baseline: 0 Goal status: Ongoing   12/05/2023  PLAN: PT FREQUENCY: 2x/week  PT DURATION: 8 weeks  PLANNED INTERVENTIONS: 97164- PT Re-evaluation, 97110-Therapeutic exercises, 97530- Therapeutic activity, 97112- Neuromuscular re-education, 97535- Self Care, 02859- Manual therapy, [id number]- Aquatic Therapy, H9716- Electrical stimulation (unattended), 97016- Vasopneumatic device, [id number]- Ultrasound, [id number]- Traction (mechanical), [id number]- Ionotophoresis 4mg /ml Dexamethasone , Patient/Family education, Joint mobilization, Scar mobilization, Cryotherapy, and Moist heat  PLAN FOR NEXT SESSION:  ***  AAROM, AROM progression, no strengthening or biceps loading per protocol.    Burnard Meth, PT 12/26/23  8:21 AM

## 2023-12-27 ENCOUNTER — Encounter

## 2023-12-29 ENCOUNTER — Encounter

## 2024-01-02 ENCOUNTER — Other Ambulatory Visit (HOSPITAL_BASED_OUTPATIENT_CLINIC_OR_DEPARTMENT_OTHER): Payer: Self-pay

## 2024-01-04 NOTE — Therapy (Signed)
 OUTPATIENT PHYSICAL THERAPY TREATMENT   Patient Name: Emily Hester MRN: [id number] DOB:Jan 15, 1971, 53 y.o., female Today's Date: 01/05/2024  END OF SESSION:  PT End of Session - 01/05/24 0846     Visit Number 9    Number of Visits 17    Date for Recertification  01/23/24    Authorization Type [employer]    PT Start Time [phone]    PT Stop Time 0927    PT Time Calculation (min) 41 min    Activity Tolerance Patient tolerated treatment well    Behavior During Therapy WFL for tasks assessed/performed            Past Medical History:  Diagnosis Date   Arthritis    patient denies   Asthma    Back pain    Chest pain    Constipation    Diabetes mellitus without complication (HCC)    type    Food allergy    GERD (gastroesophageal reflux disease)    Heartburn    Hyperlipidemia    Hypertension    Interstitial cystitis    IUD (intrauterine device) in place placed 6 weeks ago   Joint pain    Lactose intolerance    Migraines    MIgraines- not current     OSA (obstructive sleep apnea)    Osteoarthritis    Palpitations    Pneumonia    SOB (shortness of breath)    Swallowing problem    Vitamin D  deficiency    Past Surgical History:  Procedure Laterality Date   ANTERIOR HIP REVISION Right 05/20/2020   Procedure: RIGHT TOTAL HIP REVISION- ploy exhange and hip ball exchange with bone graft;  Surgeon: Vernetta Lonni GRADE, MD;  Location: [facility];  Service: Orthopedics;  Laterality: Right;   DILATION AND EVACUATION  09/30/2011   Procedure: DILATATION AND EVACUATION;  Surgeon: Lynwood FORBES Curlene PONCE, MD;  Location: [facility];  Service: Gynecology;  Laterality: N/A;   left knee surgery      arthroscopy    TOTAL HIP ARTHROPLASTY Right 03/11/2017   Procedure: RIGHT TOTAL HIP ARTHROPLASTY ANTERIOR APPROACH;  Surgeon: Vernetta Lonni GRADE, MD;  Location: [facility];  Service: Orthopedics;  Laterality: Right;   Patient Active Problem List   Diagnosis Date Noted   BMI 40.0-44.9, adult  (HCC) current 40.05 10/20/2023   Vitamin D  deficiency 10/20/2023   Other fatigue 10/20/2023   Lactose intolerance 10/20/2023   DOE (dyspnea on exertion) 10/20/2023   Nut allergy 10/20/2023   Herpes simplex type 1 infection 10/13/2022   Chronic interstitial cystitis 10/13/2022   Morbid obesity (HCC) 10/13/2022   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HCC) 05/20/2020   Status post revision of total hip 05/20/2020   Trochanteric bursitis, right hip 09/05/2019   Hyperlipidemia 08/31/2019   Diabetes mellitus (HCC) 08/31/2019   Arthritis 08/31/2019   Overactive bladder 08/31/2019   Hypertensive disorder 08/30/2018   Asthma 08/30/2018   Migraine 08/30/2018   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   Unilateral primary osteoarthritis, right hip 01/27/2017   IUD (intrauterine device) in place 01/13/2017    PCP: [name], MD  REFERRING PROVIDER: [name], MD  REFERRING DIAG: [phone] (ICD-10-CM) - Chronic left shoulder pain   THERAPY DIAG:  Stiffness of left shoulder, not elsewhere classified  Muscle weakness (generalized)  Chronic left shoulder pain  Rationale for Evaluation and Treatment: Rehabilitation  ONSET DATE:   11/24/2023 surgery   SUBJECTIVE:  SUBJECTIVE STATEMENT: Patient endorses increased stiffness this morning.  Hand dominance: Right  PERTINENT HISTORY: L shoulder injection 06/15/23  PAIN:   NPRS scale:  5/10 Pain location: left shoulder anterior Pain description: stinging, burning Aggravating factors: motion Relieving factors: ice  PRECAUTIONS: Lt Shoulder scope 11/24/23 AROM prn- follow biceps tenodesis protocol  RED FLAGS: None   WEIGHT BEARING RESTRICTIONS: No  FALLS:  Has patient fallen in last 6 months? Unknown  LIVING ENVIRONMENT: Lives  with: lives with their family Lives in: House/apartment Has following equipment at home: sling  OCCUPATION: Not working  PLOF: Independent  PATIENT GOALS: decrease pain  NEXT MD VISIT: 12/08/23  OBJECTIVE:  Note: Objective measures were completed at Evaluation unless otherwise noted.  DIAGNOSTIC FINDINGS:  MRI L Shoulder 05/19/23 IMPRESSION: 1. Mild supraspinatus tendinosis without a tear.  PATIENT SURVEYS :  PSFS: THE PATIENT SPECIFIC FUNCTIONAL SCALE  Place score of 0-10 (0 = unable to perform activity and 10 = able to perform activity at the same level as before injury or problem)  Activity Date: 11/28/23    Do hairstyle 0    2.Pick up heavy items 0    3.Sleep on arm  0    4. Lift arm up 0    Total Score 0      Total Score = Sum of activity scores/number of activities  Minimally Detectable Change: 3 points (for single activity); 2 points (for average score)  [facility] (nd). The Patient Specific Functional Scale . Retrieved from [email or web address]   COGNITION: Eval   Overall cognitive status: Within functional limits for tasks assessed     SENSATION: Eval: WFL  POSTURE: Eval L shoulder higher, rounded shoulders moderate  UPPER EXTREMITY ROM:    AROM/PROM Left eval Left 9/172025 Supine  12/08/2023 12/15/23   Shoulder flexion 22/90 115 PROM 124deg PROM 132 AA supine  Shoulder extension 40/     Shoulder abduction 25/45 90 PROM 90deg PROM (painful)   Shoulder adduction      Shoulder internal rotation Crest/30 at 45     Shoulder external rotation 15/20 30 PROM in 30 deg abduction 20deg PROM in 30deg abduction    Elbow flexion      Elbow extension      Wrist flexion      Wrist extension      Wrist ulnar deviation      Wrist radial deviation      Wrist pronation      Wrist supination      (Blank rows = not tested)  UPPER EXTREMITY MMT:  MMT Left eval   Shoulder flexion 2+    Shoulder extension 3   Shoulder abduction 2+   Shoulder adduction    Shoulder internal rotation 3   Shoulder external rotation 3   Middle trapezius    Lower trapezius    Elbow flexion    Elbow extension    Wrist flexion    Wrist extension    Wrist ulnar deviation    Wrist radial deviation    Wrist pronation    Wrist supination    Grip strength (lbs)    (Blank rows = not tested)  SHOULDER SPECIAL TESTS: Eval No special tests due to post op status  JOINT MOBILITY TESTING:  Eval Decreased throughout   PALPATION:  Eval 2+ tenderness at incisions and mid deltoid.                    TREATMENT   LShoulder  01/05/24 TherEx:  UBE level 1 with bilat UE, 2 min fwd/2 min back Wall ladder into shoulder flexion 1x10 (up to 23) Reach to cabinet shelves (first and second) with AAROM up and AROM down, 1x5 with rest break in between  UE ranger into scaption/abduction 1x5 with PT assist for last 3 reps  Supine shoulder flexion AAROM with 1# bar 2x8 Supine shoulder abduction AAROM with PT assist, 2x5 with 10s hold (able to reach ~90deg)  12/22/23 UBE no resistance 2 min each way Wall ladder 8x  (up to #23) Seated thoracic rotation 20x Seated thoracic extension 20x Seated active cross opp shoulder touches 10x Supine wand flexion/abduction  2x10  Manual PROM/scap mobs  12/20/23 UBE no resistance 2 min each way Wall ladder 8x  (up to #23) Seated thoracic rotation 20x Seated thoracic extension 20x Seated shrugs/pinches 20x each Seated active cross opp shoulder touches 10x Supine wand flexion 2x10 Supine chest press/punch with wand 2x10 Supine punch  Manual PROM  12/15/23 UBE no resistance 2 min each way Wall ladder 8x  (up to #21) Seated thoracic rotation 20x Seated thoracic extension 20x Seated shrugs/pinches 20x each Seated active cross opp shoulder touches  10x Supine wand flexion 2x10 Supine chest press/punch with wand 2x10 Manual PROM   DATE    12/13/23  Pulleys 3 min Seated thoracic rotation 20x Seated thoracic extension 20x Seated shrugs/pinches 20x each Supine wand flexion 2x10 Supine chest press/punch with wand 2x10 Manual PROM   PATIENT EDUCATION: Education details: HEP Person educated: Patient Education method: Programmer, multimedia, Facilities manager, Actor cues, and Verbal cues Education comprehension: verbalized understanding  HOME EXERCISE PROGRAM: Access Code: [email or web address] URL: [email or web address] Date: 11/28/2023 Prepared by: Burnard Meth  Exercises - Seated Scapular Retraction  - 2 x daily - 7 x weekly - 2 sets - 10 reps - Seated Shoulder Shrugs  - 2 x daily - 7 x weekly - 2 sets - 10 reps - Supine Shoulder Flexion Extension AAROM with Dowel  - 2 x daily - 7 x weekly - 2 sets - 10 reps    ASSESSMENT:  CLINICAL IMPRESSION: Patient arrived to session noting increased stiffness and soreness this date, though is recovering from recent sickness. Patient limited in activities this date secondary to pain and continues to have deficits in ROM and strength. Patient will continue to benefit from skilled PT.   OBJECTIVE IMPAIRMENTS: decreased ROM, decreased strength, hypomobility, impaired UE functional use, and pain.   ACTIVITY LIMITATIONS: carrying, lifting, sleeping, bathing, dressing, self feeding, and reach over head  PARTICIPATION LIMITATIONS: meal prep, cleaning, laundry, driving, and community activity  PERSONAL FACTORS: None- are also affecting patient's functional outcome.   REHAB POTENTIAL: Good  CLINICAL DECISION MAKING: Stable/uncomplicated  EVALUATION COMPLEXITY: Moderate  GOALS: Goals reviewed with patient? Yes  SHORT TERM GOALS: Target date: 12/19/2023    Pt to be independent with HEP. Baseline: Goal status: METR 12/15/23  2.  Decreased pain by 1 level. Baseline:  Goal status: Ongoing    12/15/23   LONG TERM GOALS: Target date: 01/23/2024    Patient to be independent in self progressive HEP by discharge. Baseline:  Goal status: Ongoing   12/05/2023  2.  Increase active range of motion to WNL throughout. Baseline:  Goal status: Ongoing   12/05/2023  3.  Increase strength to at least 4/5 throughout shoulder. Baseline: 2+ > 3 Goal status: Ongoing   12/05/2023  4.  Reduce pain with all activities to 2 out of 10 or less. Baseline:  Goal  status: Ongoing   12/05/2023  5.  Improve PSFS score by at least 2 full points for a measurable improvement. Baseline: 0 Goal status: Ongoing   12/05/2023  PLAN: PT FREQUENCY: 2x/week  PT DURATION: 8 weeks  PLANNED INTERVENTIONS: 97164- PT Re-evaluation, 97110-Therapeutic exercises, 97530- Therapeutic activity, 97112- Neuromuscular re-education, 97535- Self Care, 02859- Manual therapy, [id number]- Aquatic Therapy, H9716- Electrical stimulation (unattended), 97016- Vasopneumatic device, [id number]- Ultrasound, [id number]- Traction (mechanical), [id number]- Ionotophoresis 4mg /ml Dexamethasone , Patient/Family education, Joint mobilization, Scar mobilization, Cryotherapy, and Moist heat  PLAN FOR NEXT SESSION:    AAROM, AROM progression, no strengthening or biceps loading per protocol.    Susannah Daring, PT, DPT 01/05/24 9:31 AM

## 2024-01-05 ENCOUNTER — Encounter: Payer: Self-pay | Admitting: Allergy

## 2024-01-05 ENCOUNTER — Other Ambulatory Visit: Payer: Self-pay

## 2024-01-05 ENCOUNTER — Ambulatory Visit (INDEPENDENT_AMBULATORY_CARE_PROVIDER_SITE_OTHER): Admitting: Allergy

## 2024-01-05 ENCOUNTER — Ambulatory Visit (INDEPENDENT_AMBULATORY_CARE_PROVIDER_SITE_OTHER)

## 2024-01-05 VITALS — BP 122/88 | HR 85 | Temp 98.0°F | Resp 18 | Ht 64.0 in | Wt 231.2 lb

## 2024-01-05 DIAGNOSIS — M25612 Stiffness of left shoulder, not elsewhere classified: Secondary | ICD-10-CM | POA: Diagnosis not present

## 2024-01-05 DIAGNOSIS — J4540 Moderate persistent asthma, uncomplicated: Secondary | ICD-10-CM

## 2024-01-05 DIAGNOSIS — T7800XD Anaphylactic reaction due to unspecified food, subsequent encounter: Secondary | ICD-10-CM

## 2024-01-05 DIAGNOSIS — J3089 Other allergic rhinitis: Secondary | ICD-10-CM | POA: Diagnosis not present

## 2024-01-05 DIAGNOSIS — G8929 Other chronic pain: Secondary | ICD-10-CM

## 2024-01-05 DIAGNOSIS — M25512 Pain in left shoulder: Secondary | ICD-10-CM | POA: Diagnosis not present

## 2024-01-05 DIAGNOSIS — M6281 Muscle weakness (generalized): Secondary | ICD-10-CM

## 2024-01-05 DIAGNOSIS — H1013 Acute atopic conjunctivitis, bilateral: Secondary | ICD-10-CM

## 2024-01-05 MED ORDER — BUDESONIDE-FORMOTEROL FUMARATE 160-4.5 MCG/ACT IN AERO: 2.0000 | INHALATION_SPRAY | Freq: Two times a day (BID) | RESPIRATORY_TRACT | 5 refills | Status: AC

## 2024-01-05 MED ORDER — MONTELUKAST SODIUM 10 MG PO TABS: 10.0000 mg | ORAL_TABLET | Freq: Every day | ORAL | 1 refills | Status: AC

## 2024-01-05 MED ORDER — EPINEPHRINE 0.3 MG/0.3ML IJ SOAJ: 0.3000 mg | INTRAMUSCULAR | 1 refills | Status: AC | PRN

## 2024-01-05 NOTE — Patient Instructions (Addendum)
 Asthma - Asthma action plan (if having respiratory illness or flare): Pulmicort  nebulizer 0.5mg  twice daily for 1-2 weeks.   - Daily maintenance regimen: Symbicort  160mcg 2 puffs twice a day with spacer to help prevent cough and wheeze.   - continue Singulair  (montelukast ) 10mg  nightly - have access to albuterol  inhaler 2 puffs every 4-6 hours as needed for cough/wheeze/shortness of breath/chest tightness.  May use 15-20 minutes prior to activity.   Monitor frequency of use.  - recommend use of your albuterol  prior to outdoor activity, grilling etc - continue to do your best to avoid fragrance products Asthma control goals:  Full participation in all desired activities (may need albuterol  before activity) Albuterol  use two time or less a week on average (not counting use with activity) Cough interfering with sleep two time or less a month Oral steroids no more than once a year No hospitalizations  Allergic Rhinitis with conjunctivitis - continue allergen avoidance measures - continue Zyrtec  (Cetirizine ) 10 mg daily.  May take additional dose if needed for more control . - continue Singulair  (montelukast ) nightly - for nasal drainage/post-nasal drip use nasal Atrovent  0.06% 2 sprays each nostril up to 3-4 times a day as needed.   Recommend using in evening/prior to bedtime.  - use over-the-counter Pataday  Xtra Strength, Pataday  1 drop each eye daily or Zaditor or Alaway 1 drop each eye twice a day as needed for itchy/watery/red eyes.   These are all over-the-counter allergy eyedrop you can use.   Food allergy - continue to avoid peanuts and tree nuts - in case of an allergic reaction, give Benadryl  4 teaspoonfuls every 4 hours, and if life-threatening symptoms occur, inject with EpiPen  0.3 mg.   Follow-up in 6 months or sooner if needed

## 2024-01-05 NOTE — Progress Notes (Signed)
 Follow-up Note  RE: Emily Hester MRN: [id number] DOB: 12/05/1970 Date of Office Visit: 01/05/2024   History of present illness: Emily Hester is a 53 y.o. female presenting today for follow-up of asthma, allergic rhinitis, food allergy.  She was last seen in the office on 04/07/2023 by myself.  Discussed the use of AI scribe software for clinical note transcription with the patient, who gave verbal consent to proceed.  Despite the high pollen counts, her sinus allergy symptoms have been manageable. She began using her nasal spray, Atrovent , when she noticed others around her sneezing and blowing their noses more at work, which helped alleviate, maintain her symptom control. She continues to use Zyrtec  and Singulair  for allergy management and finds them effective. She has not needed to use eye drops.  Her asthma is well-controlled, with no recent need for a rescue inhaler since a previous flu episode. She continues to use Symbicort  as her maintenance medication and has not needed Pulmicort  recently. She avoids outdoor activities that trigger her asthma, such as grilling, and stays indoors to prevent exacerbations.  She underwent shoulder arthroscopy in September and has been gradually weaning off the sling. She was in the sling for about a month, consistently for two weeks, and then intermittently at home. She still lacks full mobility in her shoulder and is currently undergoing therapy to improve it.  She has not had any recent allergic reactions requiring the use of her EpiPen . Her tonsils are large, according to her report, but she does not report any symptoms related to them. She received her flu shot during her annual check-up.      Review of systems: 10pt ROS negative unless noted above in HPI  Past medical/social/surgical/family history have been reviewed and are unchanged unless specifically indicated below.  No changes  Medication List: Current Outpatient  Medications  Medication Sig Dispense Refill   acetaminophen  (TYLENOL ) 500 MG tablet Take 1 tablet (500 mg total) by mouth every 8 (eight) hours. 30 tablet 2   albuterol  (PROVENTIL ) (2.5 MG/3ML) 0.083% nebulizer solution Take 3 mLs (2.5 mg total) by nebulization every 6 (six) hours as needed for wheezing or shortness of breath. 75 mL 0   albuterol  (VENTOLIN  HFA) 108 (90 Base) MCG/ACT inhaler Inhale 2 puffs into the lungs every 4 (four) hours as needed. 18 g 1   amLODipine  (NORVASC ) 10 MG tablet Take 10 mg by mouth every morning.     aspirin  EC 325 MG tablet Take 1 tablet (325 mg total) by mouth daily. 14 tablet 0   aspirin -acetaminophen -caffeine (EXCEDRIN MIGRAINE) 250-250-65 MG tablet Take 2 tablets by mouth every 6 (six) hours as needed for headache.     budesonide -formoterol  (SYMBICORT ) 160-4.5 MCG/ACT inhaler Inhale 2 puffs into the lungs in the morning and at bedtime. 1 each 1   butalbital-acetaminophen -caffeine (FIORICET) 50-325-40 MG tablet      cetirizine  (ZYRTEC ) 10 MG tablet Take 1 tablet (10 mg total) by mouth daily. 90 tablet 1   Cholecalciferol  (VITAMIN D ) 50 MCG (2000 UT) tablet Take 1 tablet (2,000 Units total) by mouth daily. 30 tablet 1   Cholecalciferol  (VITAMIN D3) 50 MCG (2000 UT) TABS Take 1 tablet (2,000 Units total) by mouth daily. 30 tablet 0   cyclobenzaprine  (FLEXERIL ) 10 MG tablet Take 1 tablet (10 mg total) by mouth 3 (three) times daily as needed for muscle spasms. 60 tablet 1   diphenhydrAMINE  (BENADRYL ) 25 MG tablet Take 25 mg by mouth daily as needed for allergies.  EPINEPHrine  0.3 mg/0.3 mL IJ SOAJ injection Inject 0.3 mg into the muscle as directed.     ibuprofen  (ADVIL ) 800 MG tablet Take 1 tablet (800 mg total) by mouth every 8 (eight) hours. Please take with food, please alternate with acetaminophen  30 tablet 2   ipratropium (ATROVENT ) 0.06 % nasal spray Apply 2 sprays each nostril up to 3-4 times a day as needed 45 mL 1   Lancets (ONETOUCH DELICA PLUS  LANCET33G) MISC Apply 1 each topically daily.     levonorgestrel  (MIRENA ) 20 MCG/24HR IUD 1 each by Intrauterine route once.     lidocaine  (LIDODERM ) 5 % Place 1 patch onto the skin daily as needed (pain). Remove & Discard patch within 12 hours or as directed by MD 30 patch 3   losartan  (COZAAR ) 25 MG tablet Take 25 mg by mouth daily.  0   meloxicam (MOBIC) 15 MG tablet Take 1 tablet (15 mg total) by mouth daily. 30 tablet 0   metFORMIN  (GLUCOPHAGE -XR) 750 MG 24 hr tablet Take 750 mg by mouth 2 (two) times daily.     montelukast  (SINGULAIR ) 10 MG tablet Take 1 tablet (10 mg total) by mouth at bedtime. 90 tablet 1   nabumetone  (RELAFEN ) 750 MG tablet Take 1 tablet (750 mg total) by mouth 2 (two) times daily as needed. 60 tablet 1   nystatin cream (MYCOSTATIN) APPLY EXTERNALLY TWICE A DAY AS NEEDED 30     Olopatadine  HCl (PATADAY ) 0.2 % SOLN Place 1 drop into both eyes daily as needed. 7.5 mL 1   omeprazole  (PRILOSEC) 40 MG capsule Take 1 capsule by mouth daily.     ondansetron  (ZOFRAN ) 8 MG tablet Take 8 mg by mouth every 8 (eight) hours as needed for nausea or vomiting.     ONETOUCH VERIO test strip as directed.   3   oxybutynin (DITROPAN-XL) 10 MG 24 hr tablet Take 10 mg by mouth daily.     oxyCODONE  (ROXICODONE ) 5 MG immediate release tablet Take 1 tablet (5 mg total) by mouth every 4 (four) hours as needed for severe pain (pain score 7-10) or breakthrough pain. 30 tablet 0   promethazine -dextromethorphan (PROMETHAZINE -DM) 6.25-15 MG/5ML syrup Take 5 mLs by mouth 3 (three) times daily as needed for cough. 118 mL 0   rosuvastatin  (CRESTOR ) 10 MG tablet Take 10 mg by mouth daily.  3   solifenacin (VESICARE) 5 MG tablet Take 5 mg by mouth daily.     Spacer/Aero-Holding Chambers DEVI Take 1 each by mouth as directed. 1 each 0   tirzepatide  (MOUNJARO ) 15 MG/0.5ML Pen Inject 15 mg into the skin once a week. 2 mL 1   zonisamide (ZONEGRAN) 25 MG capsule Take 50 mg by mouth 2 (two) times daily.     No  current facility-administered medications for this visit.     Known medication allergies: Allergies  Allergen Reactions   Amoxicillin Hives, Swelling and Other (See Comments)    Has patient had a PCN reaction causing immediate rash, facial/tongue/throat swelling, SOB or lightheadedness with hypotension: Yes Has patient had a PCN reaction causing severe rash involving mucus membranes or skin necrosis: No Has patient had a PCN reaction that required hospitalization: Yes Has patient had a PCN reaction occurring within the last 10 years: No  If all of the above answers are NO, then may proceed with Cephalosporin use.    Lisinopril Itching and Cough    Heart racing    Olmesartan     jittery  Bactrim Rash     Physical examination: Blood pressure 122/88, pulse 85, temperature 98 F (36.7 C), resp. rate 18, height 5' 4 (1.626 m), weight 231 lb 3.2 oz (104.9 kg), SpO2 99%.  General: Alert, interactive, in no acute distress. HEENT: PERRLA, TMs pearly gray, turbinates non-edematous without discharge, post-pharynx non erythematous. Neck: Supple without lymphadenopathy. Lungs: Clear to auscultation without wheezing, rhonchi or rales. {no increased work of breathing. CV:  Normal S1, S2 without murmurs. Abdomen: Nondistended, nontender. Skin: Warm and dry, without lesions or rashes. Extremities:  No clubbing, cyanosis or edema. Neuro:   Grossly intact.  Diagnostics/Labs: Spirometry: FEV1: 2.01L 84%, FVC: 2.52L 84%, ratio consistent with nonobstructive pattern  Assessment and plan:   Asthma - Asthma action plan (if having respiratory illness or flare): Pulmicort  nebulizer 0.5mg  twice daily for 1-2 weeks.   - Daily maintenance regimen: Symbicort  160mcg 2 puffs twice a day with spacer to help prevent cough and wheeze.   - continue Singulair  (montelukast ) 10mg  nightly - have access to albuterol  inhaler 2 puffs every 4-6 hours as needed for cough/wheeze/shortness of breath/chest  tightness.  May use 15-20 minutes prior to activity.   Monitor frequency of use.  - recommend use of your albuterol  prior to outdoor activity, grilling etc - continue to do your best to avoid fragrance products Asthma control goals:  Full participation in all desired activities (may need albuterol  before activity) Albuterol  use two time or less a week on average (not counting use with activity) Cough interfering with sleep two time or less a month Oral steroids no more than once a year No hospitalizations  Allergic Rhinitis with conjunctivitis - continue allergen avoidance measures - continue Zyrtec  (Cetirizine ) 10 mg daily.  May take additional dose if needed for more control . - continue Singulair  (montelukast ) nightly - for nasal drainage/post-nasal drip use nasal Atrovent  0.06% 2 sprays each nostril up to 3-4 times a day as needed.   Recommend using in evening/prior to bedtime.  - use over-the-counter Pataday  Xtra Strength, Pataday  1 drop each eye daily or Zaditor or Alaway 1 drop each eye twice a day as needed for itchy/watery/red eyes.   These are all over-the-counter allergy eyedrop you can use.   Food allergy - continue to avoid peanuts and tree nuts - in case of an allergic reaction, give Benadryl  4 teaspoonfuls every 4 hours, and if life-threatening symptoms occur, inject with EpiPen  0.3 mg.   Follow-up in 6 months or sooner if needed   I appreciate the opportunity to take part in Inanna's care. Please do not hesitate to contact me with questions.  Sincerely,   Danita Brain, MD Allergy/Immunology [facility]

## 2024-01-10 ENCOUNTER — Encounter (INDEPENDENT_AMBULATORY_CARE_PROVIDER_SITE_OTHER): Payer: Self-pay | Admitting: Nurse Practitioner

## 2024-01-10 ENCOUNTER — Ambulatory Visit (INDEPENDENT_AMBULATORY_CARE_PROVIDER_SITE_OTHER): Admitting: Nurse Practitioner

## 2024-01-10 VITALS — BP 113/76 | HR 73 | Temp 98.8°F | Ht 64.5 in | Wt 228.0 lb

## 2024-01-10 DIAGNOSIS — E66812 Obesity, class 2: Secondary | ICD-10-CM

## 2024-01-10 DIAGNOSIS — E785 Hyperlipidemia, unspecified: Secondary | ICD-10-CM

## 2024-01-10 DIAGNOSIS — Z6839 Body mass index (BMI) 39.0-39.9, adult: Secondary | ICD-10-CM

## 2024-01-10 DIAGNOSIS — E1159 Type 2 diabetes mellitus with other circulatory complications: Secondary | ICD-10-CM

## 2024-01-10 DIAGNOSIS — I152 Hypertension secondary to endocrine disorders: Secondary | ICD-10-CM

## 2024-01-10 DIAGNOSIS — E1169 Type 2 diabetes mellitus with other specified complication: Secondary | ICD-10-CM | POA: Diagnosis not present

## 2024-01-10 DIAGNOSIS — E559 Vitamin D deficiency, unspecified: Secondary | ICD-10-CM

## 2024-01-10 DIAGNOSIS — Z7985 Long-term (current) use of injectable non-insulin antidiabetic drugs: Secondary | ICD-10-CM

## 2024-01-10 MED ORDER — TIRZEPATIDE 12.5 MG/0.5ML ~~LOC~~ SOAJ: 12.5000 mg | SUBCUTANEOUS | 0 refills | Status: DC

## 2024-01-10 NOTE — Progress Notes (Signed)
 Office: [phone]  /  Fax: [phone]  WEIGHT SUMMARY AND BIOMETRICS  Weight Lost Since Last Visit: 5 lb  Weight Gained Since Last Visit: 0   Vitals Temp: 98.8 F (37.1 C) BP: 113/76 Pulse Rate: 73 SpO2: 99 %   Anthropometric Measurements Height: 5' 4.5 (1.638 m) Weight: 228 lb (103.4 kg) BMI (Calculated): 38.55 Weight at Last Visit: 233 lb Weight Lost Since Last Visit: 5 lb Weight Gained Since Last Visit: 0 Starting Weight: 237 lb Total Weight Loss (lbs): 9 lb (4.082 kg) Peak Weight: 280 lb Waist Measurement : 51 inches   Body Composition  Body Fat %: 47.8 % Fat Mass (lbs): 109 lbs Muscle Mass (lbs): 113 lbs Total Body Water  (lbs): 818 lbs Visceral Fat Rating : 14   Other Clinical Data Fasting: no Labs: no Starting Date: 10/20/23    Total Weight Loss: 9 pounds Percent of body weight lost:3.8%  Bio Impedance Data reviewed with patient:  Muscle mass is up 2 pounds. Adipose is down 7.4 pounds. PBF is down from 49.9% to 47.8%. Visceral fat rating has decreased from 15 to 14.   HPI  Chief Complaint: OBESITY  Anjenette is here to discuss her progress with her obesity treatment plan. She is on the the Category 2 Plan and states she is following her eating plan approximately 50 % of the time. She states she is not currently exercising, had left shoulder arthroscopy 11/24/23.   Interval History:  Since last office visit she continues on Mounjaro  15 mg SQ QW and Metformin  750 mg BID for Type 2 DM.  Denies side effects with medication.  Denies hypoglycemic symptoms of dizziness, nausea, overheated. She has not been getting enough protein- maybe 60 ounces of protein daily. She has been getting full very fast, not hungry. She is drinking may 16 ounces of water  daily. She has only been eating 1 meal a day on the Mounjaro  15 mg. Continues to recover from her shoulder surgery, limited activity- continuing with PT and has a post op visit tomorrow.    Latiffany does have  hypertension which is currently controlled with amlodipine  10 mg every day and losartan  25 mg every day .  Denies headaches, chest pain, shortness of breath at rest.  BP Readings from Last 3 Encounters:  01/10/24 113/76  01/05/24 122/88  12/12/23 121/77   She has hyperlipidemia and continues on Rosuvastatin  10 mg every day without side effects. Continues on VIT D3 2000 units daily for Vit D deficiency- denies side effects.   PHYSICAL EXAM:  Blood pressure 113/76, pulse 73, temperature 98.8 F (37.1 C), height 5' 4.5 (1.638 m), weight 228 lb (103.4 kg), SpO2 99%. Body mass index is 38.53 kg/m.  General: Well Developed, well nourished, and in no acute distress.  HEENT: Normocephalic, atraumatic; EOMI, sclerae are anicteric. Skin: Warm and dry, good turgor Chest:  Normal excursion, shape, no gross ABN Respiratory: No conversational dyspnea; speaking in full sentences NeuroM-Sk:  Normal gross ROM *3 extremities , left shoulder has limited movement Psych: A and O X 3, insight adequate, mood- full    DIAGNOSTIC DATA REVIEWED:  BMET    Component Value Date/Time   NA 139 10/20/2023 1014   K 4.2 10/20/2023 1014   CL 103 10/20/2023 1014   CO2 19 (L) 10/20/2023 1014   GLUCOSE 73 10/20/2023 1014   GLUCOSE 126 (H) 03/13/2023 0209   BUN 12 10/20/2023 1014   CREATININE 0.77 10/20/2023 1014   CREATININE 0.81 04/16/2012 1055   CALCIUM   9.7 10/20/2023 1014   GFRNONAA >60 03/13/2023 0209   GFRAA >60 01/04/2018 1351   Lab Results  Component Value Date   HGBA1C 5.7 (H) 10/20/2023   HGBA1C 6.5 (H) 03/03/2017   Lab Results  Component Value Date   INSULIN  23.8 10/20/2023   Lab Results  Component Value Date   TSH 1.670 10/20/2023   CBC    Component Value Date/Time   WBC 10.1 10/20/2023 1014   WBC 8.8 03/13/2023 0209   RBC 5.04 10/20/2023 1014   RBC 4.96 03/13/2023 0209   HGB 13.2 10/20/2023 1014   HCT 42.8 10/20/2023 1014   PLT 291 10/20/2023 1014   MCV 85 10/20/2023 1014    MCH 26.2 (L) 10/20/2023 1014   MCH 27.0 03/13/2023 0209   MCHC 30.8 (L) 10/20/2023 1014   MCHC 31.8 03/13/2023 0209   RDW 14.7 10/20/2023 1014   Iron Studies No results found for: IRON, TIBC, FERRITIN, IRONPCTSAT Lipid Panel     Component Value Date/Time   CHOL 157 10/20/2023 1014   TRIG 138 10/20/2023 1014   HDL 50 10/20/2023 1014   LDLCALC 83 10/20/2023 1014   Hepatic Function Panel     Component Value Date/Time   PROT 7.6 10/20/2023 1014   ALBUMIN 4.7 10/20/2023 1014   AST 17 10/20/2023 1014   ALT 16 10/20/2023 1014   ALKPHOS 143 (H) 10/20/2023 1014   BILITOT 0.5 10/20/2023 1014      Component Value Date/Time   TSH 1.670 10/20/2023 1014   Nutritional Lab Results  Component Value Date   VD25OH 33.4 10/20/2023     ASSESSMENT AND PLAN  Class 2 severe obesity with serious comorbidity and body mass index (BMI) of 39.0 to 39.9 in adult, unspecified obesity type TREATMENT PLAN FOR OBESITY:  Recommended Dietary Goals  Obie is currently in the action stage of change. As such, her goal is to continue weight management plan. She has agreed to the Category 2 Plan.  Behavioral Intervention  We discussed the following Behavioral Modification Strategies today: increasing lean protein intake to established goals, increasing water  intake , continue to work on maintaining a reduced calorie state, getting the recommended amount of protein, incorporating whole foods, making healthy choices, staying well hydrated and practicing mindfulness when eating., and increase protein intake, fibrous foods (25 grams per day for women, 30 grams for men) and water  to improve satiety and decrease hunger signals. She will focus on increasing her protein to at least 30 grams of protein for breakfast, lunch and dinner.  She is given a list of protein with amounts to equal 30 grams of protein. She is also to try to get 64 ounces of water  daily    Recommended Physical Activity Goals  She  has agreed to Continue current level of physical activity , Think about enjoyable ways to increase daily physical activity and overcoming barriers to exercise, and Increase physical activity in their day and reduce sedentary time (increase NEAT). Will continue PT and await surgeon advice on physical activity at appointment tomorrow   Pharmacotherapy We discussed various medication options to help [facility] with her weight loss efforts and we both agreed to decrease Mounjaro  to 12.5 mg SQ QW to hopefully increase her appetite and ability to get in her protein/calorie requirements.  ASSOCIATED CONDITIONS ADDRESSED TODAY  Action/Plan  Type 2 diabetes mellitus with hyperlipidemia (HCC) Continue Category 2  meal plan, limit simple carbohydrates- reviewed and encouraged to hit her calorie and protein goals Decrease Mounjaro  to  12.5 mg SQ QW- hopefully will be able to increase food intake Continue Rosuvastatin  10 mg every day and limit saturated fats- discussed using more egg whites than the whole egg Continue exercise with current goal of 150 minutes of moderate to high intensity exercise/week.  -     Tirzepatide ; Inject 12.5 mg into the skin once a week.  Dispense: 2 mL; Refill: 0  Hypertension associated with type 2 diabetes mellitus (HCC) Continue amlodipine  10 mg every day and losartan  25 mg every day Continue Category 2 meal plan  and DASH diet Monitor BP and if consistently >140/90 notify PCP If develops headaches, chest pain, shortness of breath or dizziness go to ER  Vitamin D  deficiency       Continue Vit D3 2000 units daily for supplementation        Return in about 4 weeks (around 02/07/2024).SABRA She was informed of the importance of frequent follow up visits to maximize her success with intensive lifestyle modifications for her multiple health conditions.   ATTESTASTION STATEMENTS:  Reviewed by clinician on day of visit: allergies, medications, problem list, medical history,  surgical history, family history, social history, and previous encounter notes.     Valentino Saavedra ANP-C

## 2024-01-11 ENCOUNTER — Other Ambulatory Visit (HOSPITAL_BASED_OUTPATIENT_CLINIC_OR_DEPARTMENT_OTHER): Payer: Self-pay

## 2024-01-11 ENCOUNTER — Ambulatory Visit (INDEPENDENT_AMBULATORY_CARE_PROVIDER_SITE_OTHER): Admitting: Orthopaedic Surgery

## 2024-01-11 DIAGNOSIS — M25512 Pain in left shoulder: Secondary | ICD-10-CM

## 2024-01-11 DIAGNOSIS — G8929 Other chronic pain: Secondary | ICD-10-CM

## 2024-01-11 MED ORDER — MELOXICAM 15 MG PO TABS
15.0000 mg | ORAL_TABLET | Freq: Every day | ORAL | 0 refills | Status: AC
  Filled 2024-01-11: qty 30, 30d supply, fill #0

## 2024-01-11 MED ORDER — ACETAMINOPHEN 500 MG PO TABS
500.0000 mg | ORAL_TABLET | Freq: Three times a day (TID) | ORAL | 0 refills | Status: AC
  Filled 2024-01-11: qty 30, 10d supply, fill #0

## 2024-01-11 NOTE — Progress Notes (Signed)
 Post Operative Evaluation    Procedure/Date of Surgery: Left shoulder arthroscopy with biceps tenodesis 9/11  Interval History:  Presents 6 weeks status post above procedure.  She is continuing to progress with physical therapy.  Does occasionally have some soreness and achiness   PMH/PSH/Family History/Social History/Meds/Allergies:    Past Medical History:  Diagnosis Date   Arthritis    patient denies   Asthma    Back pain    Chest pain    Constipation    Diabetes mellitus without complication (HCC)    type    Food allergy    GERD (gastroesophageal reflux disease)    Heartburn    Hyperlipidemia    Hypertension    Interstitial cystitis    IUD (intrauterine device) in place placed 6 weeks ago   Joint pain    Lactose intolerance    Migraines    MIgraines- not current     OSA (obstructive sleep apnea)    Osteoarthritis    Palpitations    Pneumonia    SOB (shortness of breath)    Swallowing problem    Vitamin D  deficiency    Past Surgical History:  Procedure Laterality Date   ANTERIOR HIP REVISION Right 05/20/2020   Procedure: RIGHT TOTAL HIP REVISION- ploy exhange and hip ball exchange with bone graft;  Surgeon: Vernetta Lonni GRADE, MD;  Location: [facility] OR;  Service: Orthopedics;  Laterality: Right;   DILATION AND EVACUATION  09/30/2011   Procedure: DILATATION AND EVACUATION;  Surgeon: Lynwood FORBES Curlene PONCE, MD;  Location: [facility]S;  Service: Gynecology;  Laterality: N/A;   left knee surgery      arthroscopy    left shoulder surgery  11/24/2023   TOTAL HIP ARTHROPLASTY Right 03/11/2017   Procedure: RIGHT TOTAL HIP ARTHROPLASTY ANTERIOR APPROACH;  Surgeon: Vernetta Lonni GRADE, MD;  Location: [facility] ORS;  Service: Orthopedics;  Laterality: Right;   Social History   Socioeconomic History   Marital status: Married    Spouse name: Not on file   Number of children: Not on file   Years of education: Not on file   Highest education  level: Not on file  Occupational History   Not on file  Tobacco Use   Smoking status: Never    Passive exposure: Current   Smokeless tobacco: Never  Vaping Use   Vaping status: Never Used  Substance and Sexual Activity   Alcohol use: No   Drug use: No   Sexual activity: Not on file  Other Topics Concern   Not on file  Social History Narrative   Not on file   Social Drivers of Health   Financial Resource Strain: Not on file  Food Insecurity: Low Risk  (07/19/2023)   Received from [facility]   Hunger Vital Sign    Within the past 12 months, you worried that your food would run out before you got money to buy more: Never true    Within the past 12 months, the food you bought just didn't last and you didn't have money to get more. : Never true  Transportation Needs: No Transportation Needs (07/19/2023)   Received from [facility]    In the past 12 months, has lack of reliable transportation kept you from medical appointments, meetings, work or from getting things needed  for daily living? : No  Physical Activity: Not on file  Stress: Not on file  Social Connections: Not on file   Family History  Problem Relation Age of Onset   Stroke Mother    Hyperlipidemia Mother    Hypertension Mother    Diabetes Mother    Heart disease Mother    Hyperlipidemia Father    Hypertension Father    Diabetes Father    Eczema Sister    Asthma Brother    Eczema Daughter    Migraines Daughter    Migraines Daughter    Asthma Son    Allergic rhinitis Neg Hx    Angioedema Neg Hx    Immunodeficiency Neg Hx    Urticaria Neg Hx    Headache Neg Hx    Sleep apnea Neg Hx    Allergies  Allergen Reactions   Amoxicillin Hives, Swelling and Other (See Comments)    Has patient had a PCN reaction causing immediate rash, facial/tongue/throat swelling, SOB or lightheadedness with hypotension: Yes Has patient had a PCN reaction causing severe rash involving mucus membranes or skin  necrosis: No Has patient had a PCN reaction that required hospitalization: Yes Has patient had a PCN reaction occurring within the last 10 years: No  If all of the above answers are NO, then may proceed with Cephalosporin use.    Lisinopril Itching and Cough    Heart racing    Olmesartan     jittery   Bactrim Rash   Current Outpatient Medications  Medication Sig Dispense Refill   acetaminophen  (TYLENOL ) 500 MG tablet Take 1 tablet (500 mg total) by mouth every 8 (eight) hours for 10 days. 30 tablet 0   meloxicam (MOBIC) 15 MG tablet Take 1 tablet (15 mg total) by mouth daily. 30 tablet 0   acetaminophen  (TYLENOL ) 500 MG tablet Take 1 tablet (500 mg total) by mouth every 8 (eight) hours. 30 tablet 2   albuterol  (PROVENTIL ) (2.5 MG/3ML) 0.083% nebulizer solution Take 3 mLs (2.5 mg total) by nebulization every 6 (six) hours as needed for wheezing or shortness of breath. 75 mL 0   albuterol  (VENTOLIN  HFA) 108 (90 Base) MCG/ACT inhaler Inhale 2 puffs into the lungs every 4 (four) hours as needed. 18 g 1   amLODipine  (NORVASC ) 10 MG tablet Take 10 mg by mouth every morning.     aspirin  EC 325 MG tablet Take 1 tablet (325 mg total) by mouth daily. 14 tablet 0   aspirin -acetaminophen -caffeine (EXCEDRIN MIGRAINE) 250-250-65 MG tablet Take 2 tablets by mouth every 6 (six) hours as needed for headache.     budesonide  (PULMICORT ) 0.5 MG/2ML nebulizer solution Take 0.5 mg by nebulization. prn     budesonide -formoterol  (SYMBICORT ) 160-4.5 MCG/ACT inhaler Inhale 2 puffs into the lungs in the morning and at bedtime. 1 each 5   butalbital-acetaminophen -caffeine (FIORICET) 50-325-40 MG tablet      cetirizine  (ZYRTEC ) 10 MG tablet Take 1 tablet (10 mg total) by mouth daily. 90 tablet 1   Cholecalciferol  (VITAMIN D ) 50 MCG (2000 UT) tablet Take 1 tablet (2,000 Units total) by mouth daily. 30 tablet 1   cyclobenzaprine  (FLEXERIL ) 10 MG tablet Take 1 tablet (10 mg total) by mouth 3 (three) times daily as  needed for muscle spasms. 60 tablet 1   diphenhydrAMINE  (BENADRYL ) 25 MG tablet Take 25 mg by mouth daily as needed for allergies.     EPINEPHrine  0.3 mg/0.3 mL IJ SOAJ injection Inject 0.3 mg into the muscle as needed  for anaphylaxis. 2 each 1   ibuprofen  (ADVIL ) 800 MG tablet Take 1 tablet (800 mg total) by mouth every 8 (eight) hours. Please take with food, please alternate with acetaminophen  30 tablet 2   ipratropium (ATROVENT ) 0.06 % nasal spray Apply 2 sprays each nostril up to 3-4 times a day as needed 45 mL 1   Lancets (ONETOUCH DELICA PLUS LANCET33G) MISC Apply 1 each topically daily.     levonorgestrel  (MIRENA ) 20 MCG/24HR IUD 1 each by Intrauterine route once.     lidocaine  (LIDODERM ) 5 % Place 1 patch onto the skin daily as needed (pain). Remove & Discard patch within 12 hours or as directed by MD 30 patch 3   losartan  (COZAAR ) 25 MG tablet Take 25 mg by mouth daily.  0   meloxicam (MOBIC) 15 MG tablet Take 1 tablet (15 mg total) by mouth daily. 30 tablet 0   metFORMIN  (GLUCOPHAGE -XR) 750 MG 24 hr tablet Take 750 mg by mouth 2 (two) times daily.     montelukast  (SINGULAIR ) 10 MG tablet Take 1 tablet (10 mg total) by mouth at bedtime. 90 tablet 1   nabumetone  (RELAFEN ) 750 MG tablet Take 1 tablet (750 mg total) by mouth 2 (two) times daily as needed. 60 tablet 1   nystatin cream (MYCOSTATIN) APPLY EXTERNALLY TWICE A DAY AS NEEDED 30     Olopatadine  HCl (PATADAY ) 0.2 % SOLN Place 1 drop into both eyes daily as needed. 7.5 mL 1   omeprazole  (PRILOSEC) 40 MG capsule Take 1 capsule by mouth daily.     ondansetron  (ZOFRAN ) 8 MG tablet Take 8 mg by mouth every 8 (eight) hours as needed for nausea or vomiting.     ONETOUCH VERIO test strip as directed.   3   oxybutynin (DITROPAN-XL) 10 MG 24 hr tablet Take 10 mg by mouth daily.     oxyCODONE  (ROXICODONE ) 5 MG immediate release tablet Take 1 tablet (5 mg total) by mouth every 4 (four) hours as needed for severe pain (pain score 7-10) or  breakthrough pain. 30 tablet 0   promethazine -dextromethorphan (PROMETHAZINE -DM) 6.25-15 MG/5ML syrup Take 5 mLs by mouth 3 (three) times daily as needed for cough. 118 mL 0   rosuvastatin  (CRESTOR ) 10 MG tablet Take 10 mg by mouth daily.  3   solifenacin (VESICARE) 5 MG tablet Take 5 mg by mouth daily.     Spacer/Aero-Holding Chambers DEVI Take 1 each by mouth as directed. 1 each 0   tirzepatide  (MOUNJARO ) 12.5 MG/0.5ML Pen Inject 12.5 mg into the skin once a week. 2 mL 0   traMADol  (ULTRAM ) 50 MG tablet 50 mg.     UBRELVY  100 MG TABS TAKE 1 TABLET BY MOUTH AS NEEDED FOR MIGRAINE. MAY REPEAT ONCE AFTER 2 HOURS     UNABLE TO FIND USE TO CHECK YOUR BLOOD SUGAR FINGER STICK CHECK SUGARS 90 DAYS     VITAMIN D , CHOLECALCIFEROL , PO      zonisamide (ZONEGRAN) 25 MG capsule Take 50 mg by mouth 2 (two) times daily.     No current facility-administered medications for this visit.   No results found.  Review of Systems:   A ROS was performed including pertinent positives and negatives as documented in the HPI.   Musculoskeletal Exam:    There were no vitals taken for this visit.  Left shoulder incisions are well-appearing without erythema or drainage.  Active forward elevation is to 100 degrees.  External rotation at side to 30 degrees internal rotation  deferred today just neurosensory intact  Imaging:      I personally reviewed and interpreted the radiographs.   Assessment:   6 weeks status post left shoulder arthroscopy with biceps tenodesis doing well.  At this time she will continue to progress to physical therapy.  I will plan to see her back in 6 weeks for reassessment  Plan :    - Return to clinic 6 weeks for reassessment      I personally saw and evaluated the patient, and participated in the management and treatment plan.  Elspeth Parker, MD Attending Physician, Orthopedic Surgery  This document was dictated using Dragon voice recognition software. A reasonable attempt  at proof reading has been made to minimize errors.

## 2024-01-13 ENCOUNTER — Ambulatory Visit (INDEPENDENT_AMBULATORY_CARE_PROVIDER_SITE_OTHER): Admitting: Rehabilitative and Restorative Service Providers"

## 2024-01-13 ENCOUNTER — Encounter: Payer: Self-pay | Admitting: Rehabilitative and Restorative Service Providers"

## 2024-01-13 DIAGNOSIS — M25612 Stiffness of left shoulder, not elsewhere classified: Secondary | ICD-10-CM

## 2024-01-13 DIAGNOSIS — G8929 Other chronic pain: Secondary | ICD-10-CM | POA: Diagnosis not present

## 2024-01-13 DIAGNOSIS — M6281 Muscle weakness (generalized): Secondary | ICD-10-CM | POA: Diagnosis not present

## 2024-01-13 DIAGNOSIS — M25512 Pain in left shoulder: Secondary | ICD-10-CM | POA: Diagnosis not present

## 2024-01-13 NOTE — Therapy (Signed)
 OUTPATIENT PHYSICAL THERAPY TREATMENT   Patient Name: KEILA TURAN MRN: [id number] DOB:October 31, 1970, 53 y.o., female Today's Date: 01/13/2024  END OF SESSION:  PT End of Session - 01/13/24 0945     Visit Number 10    Number of Visits 17    Date for Recertification  01/23/24    Authorization Type [employer]    PT Start Time 0932    PT Stop Time 1011    PT Time Calculation (min) 39 min    Activity Tolerance Patient tolerated treatment well    Behavior During Therapy WFL for tasks assessed/performed             Past Medical History:  Diagnosis Date   Arthritis    patient denies   Asthma    Back pain    Chest pain    Constipation    Diabetes mellitus without complication (HCC)    type    Food allergy    GERD (gastroesophageal reflux disease)    Heartburn    Hyperlipidemia    Hypertension    Interstitial cystitis    IUD (intrauterine device) in place placed 6 weeks ago   Joint pain    Lactose intolerance    Migraines    MIgraines- not current     OSA (obstructive sleep apnea)    Osteoarthritis    Palpitations    Pneumonia    SOB (shortness of breath)    Swallowing problem    Vitamin D  deficiency    Past Surgical History:  Procedure Laterality Date   ANTERIOR HIP REVISION Right 05/20/2020   Procedure: RIGHT TOTAL HIP REVISION- ploy exhange and hip ball exchange with bone graft;  Surgeon: Vernetta Lonni GRADE, MD;  Location: [facility];  Service: Orthopedics;  Laterality: Right;   DILATION AND EVACUATION  09/30/2011   Procedure: DILATATION AND EVACUATION;  Surgeon: Lynwood FORBES Curlene PONCE, MD;  Location: [facility];  Service: Gynecology;  Laterality: N/A;   left knee surgery      arthroscopy    left shoulder surgery  11/24/2023   TOTAL HIP ARTHROPLASTY Right 03/11/2017   Procedure: RIGHT TOTAL HIP ARTHROPLASTY ANTERIOR APPROACH;  Surgeon: Vernetta Lonni GRADE, MD;  Location: [facility];  Service: Orthopedics;  Laterality: Right;   Patient Active Problem List    Diagnosis Date Noted   BMI 40.0-44.9, adult (HCC) current 40.05 10/20/2023   Vitamin D  deficiency 10/20/2023   Other fatigue 10/20/2023   Lactose intolerance 10/20/2023   DOE (dyspnea on exertion) 10/20/2023   Nut allergy 10/20/2023   Herpes simplex type 1 infection 10/13/2022   Chronic interstitial cystitis 10/13/2022   Morbid obesity (HCC) 10/13/2022   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HCC) 05/20/2020   Status post revision of total hip 05/20/2020   Trochanteric bursitis, right hip 09/05/2019   Hyperlipidemia 08/31/2019   Diabetes mellitus (HCC) 08/31/2019   Arthritis 08/31/2019   Overactive bladder 08/31/2019   Hypertensive disorder 08/30/2018   Asthma 08/30/2018   Migraine 08/30/2018   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   Unilateral primary osteoarthritis, right hip 01/27/2017   IUD (intrauterine device) in place 01/13/2017    PCP: [name], MD  REFERRING PROVIDER: Genelle Standing, MD  REFERRING DIAG: [phone] (ICD-10-CM) - Chronic left shoulder pain   THERAPY DIAG:  Stiffness of left shoulder, not elsewhere classified  Chronic left shoulder pain  Muscle weakness (generalized)  Rationale for Evaluation and Treatment: Rehabilitation  ONSET DATE:   11/24/2023 surgery   SUBJECTIVE:  SUBJECTIVE STATEMENT: Pt indicated 3/10 upon arrival today.    Hand dominance: Right  PERTINENT HISTORY: L shoulder injection 06/15/23  PAIN:   NPRS scale: 3/10 upon arrival.  Pain location: left shoulder anterior Pain description: stinging, burning Aggravating factors: motion Relieving factors: ice  PRECAUTIONS: Lt Shoulder scope 11/24/23 AROM prn- follow biceps tenodesis protocol  RED FLAGS: None   WEIGHT BEARING RESTRICTIONS: No  FALLS:  Has patient fallen in  last 6 months? Unknown  LIVING ENVIRONMENT: Lives with: lives with their family Lives in: House/apartment Has following equipment at home: sling  OCCUPATION: Not working  PLOF: Independent  PATIENT GOALS: decrease pain  NEXT MD VISIT: 12/08/23  OBJECTIVE:  Note: Objective measures were completed at Evaluation unless otherwise noted.  DIAGNOSTIC FINDINGS:  MRI L Shoulder 05/19/23 IMPRESSION: 1. Mild supraspinatus tendinosis without a tear.  PATIENT SURVEYS :  PSFS: THE PATIENT SPECIFIC FUNCTIONAL SCALE  Place score of 0-10 (0 = unable to perform activity and 10 = able to perform activity at the same level as before injury or problem)  Activity Date: 11/28/23 01/13/2024   Do hairstyle 0 2   2.Pick up heavy items 0 4   3.Sleep on arm  0 4   4. Lift arm up 0 3   Total Score 0 3.25     Total Score = Sum of activity scores/number of activities  Minimally Detectable Change: 3 points (for single activity); 2 points (for average score)  [facility] (nd). The Patient Specific Functional Scale . Retrieved from [employer]ateo[employer].com.[email or web address]   COGNITION: Eval   Overall cognitive status: Within functional limits for tasks assessed     SENSATION: Eval: WFL  POSTURE: Eval L shoulder higher, rounded shoulders moderate  UPPER EXTREMITY ROM:    AROM/PROM Left eval Left 9/172025 Supine  12/08/2023 12/15/23  01/13/2024 AROM in supine  Shoulder flexion 22/90 115 PROM 124deg PROM 132 AA supine 130  Shoulder extension 40/      Shoulder abduction 25/45 90 PROM 90deg PROM (painful)    Shoulder adduction       Shoulder internal rotation Crest/30 at 45    70 AROM in 45 deg abduction  Shoulder external rotation 15/20 30 PROM in 30 deg abduction 20deg PROM in 30deg abduction   65 AROM in 45 deg abduction  Elbow flexion       Elbow extension       Wrist flexion       Wrist extension       Wrist ulnar deviation       Wrist  radial deviation       Wrist pronation       Wrist supination       (Blank rows = not tested)  UPPER EXTREMITY MMT:  MMT Left eval   Shoulder flexion 2+   Shoulder extension 3   Shoulder abduction 2+   Shoulder adduction    Shoulder internal rotation 3   Shoulder external rotation 3   Middle trapezius    Lower trapezius    Elbow flexion    Elbow extension    Wrist flexion    Wrist extension    Wrist ulnar deviation    Wrist radial deviation    Wrist pronation    Wrist supination    Grip strength (lbs)    (Blank rows = not tested)  SHOULDER SPECIAL TESTS: Eval No special tests due to post op status  JOINT MOBILITY TESTING:  Eval Decreased throughout   PALPATION:  Eval  2+ tenderness at incisions and mid deltoid.                     TREATMENT        DATE:  01/13/2024 Therex: UBE fwd/back 3 mins each way lvl 2.5 for ROM with 1 min rest between directions.  HEP review with additions of wand based exercises at home discussed today.   Neuro Re-ed (muscle activation, control, coordination, scapular control) Supine AAROM scapular protraction press 3 sec hold x 10 1 lb bar  Seated scapular retraction 5 sec hold 2 x 10  Seated Lt arm at side isometric painfree press 5 sec hold x 6 for ER, IR.    TherActivity (to improve reach, elevation) UE ranger flexion AAROM into flexion with ipsilateral leg step in 2 x 15  Supine AAROM 1 lb bar flexion 2 x 10     TREATMENT        DATE: 01/05/24 TherEx:  UBE level 1 with bilat UE, 2 min fwd/2 min back Wall ladder into shoulder flexion 1x10 (up to 23) Reach to cabinet shelves (first and second) with AAROM up and AROM down, 1x5 with rest break in between  UE ranger into scaption/abduction 1x5 with PT assist for last 3 reps  Supine shoulder flexion AAROM with 1# bar 2x8 Supine shoulder abduction AAROM with PT assist, 2x5 with 10s hold (able to reach ~90deg)  TREATMENT        DATE: 12/22/23 UBE no resistance 2 min each  way Wall ladder 8x  (up to #23) Seated thoracic rotation 20x Seated thoracic extension 20x Seated active cross opp shoulder touches 10x Supine wand flexion/abduction  2x10  Manual PROM/scap mobs  TREATMENT        DATE: 12/20/23 UBE no resistance 2 min each way Wall ladder 8x  (up to #23) Seated thoracic rotation 20x Seated thoracic extension 20x Seated shrugs/pinches 20x each Seated active cross opp shoulder touches 10x Supine wand flexion 2x10 Supine chest press/punch with wand 2x10 Supine punch  Manual PROM     PATIENT EDUCATION: Education details: HEP Person educated: Patient Education method: Programmer, Multimedia, Facilities Manager, Actor cues, and Verbal cues Education comprehension: verbalized understanding  HOME EXERCISE PROGRAM: Access Code: [email or web address] URL: [email or web address] Date: 11/28/2023 Prepared by: Burnard Meth  Exercises - Seated Scapular Retraction  - 2 x daily - 7 x weekly - 2 sets - 10 reps - Seated Shoulder Shrugs  - 2 x daily - 7 x weekly - 2 sets - 10 reps - Supine Shoulder Flexion Extension AAROM with Dowel  - 2 x daily - 7 x weekly - 2 sets - 10 reps    ASSESSMENT:  CLINICAL IMPRESSION: Patient specific functional scale was improved mildly compared to eval but still showing great room for improvement.   AROM updates were improved today but still limited in musclle activation and control.  Continued skilled PT services.   OBJECTIVE IMPAIRMENTS: decreased ROM, decreased strength, hypomobility, impaired UE functional use, and pain.   ACTIVITY LIMITATIONS: carrying, lifting, sleeping, bathing, dressing, self feeding, and reach over head  PARTICIPATION LIMITATIONS: meal prep, cleaning, laundry, driving, and community activity  PERSONAL FACTORS: None- are also affecting patient's functional outcome.   REHAB POTENTIAL: Good  CLINICAL DECISION MAKING: Stable/uncomplicated  EVALUATION COMPLEXITY: Moderate  GOALS: Goals reviewed with  patient? Yes  SHORT TERM GOALS: Target date: 12/19/2023    Pt to be independent with HEP. Baseline: Goal status: METR 12/15/23  2.  Decreased pain by 1 level.  Baseline:  Goal status: Ongoing   12/15/23   LONG TERM GOALS: Target date: 01/23/2024    Patient to be independent in self progressive HEP by discharge. Baseline:  Goal status: Ongoing   01/13/2024  2.  Increase active range of motion to WNL throughout. Baseline:  Goal status: Ongoing   01/13/2024  3.  Increase strength to at least 4/5 throughout shoulder. Baseline: 2+ > 3 Goal status: Ongoing   01/13/2024  4.  Reduce pain with all activities to 2 out of 10 or less. Baseline:  Goal status: Ongoing   01/13/2024  5.  Improve PSFS score by at least 2 full points for a measurable improvement. Baseline: 0 Goal status: Ongoing   01/13/2024  PLAN: PT FREQUENCY: 2x/week  PT DURATION: 8 weeks  PLANNED INTERVENTIONS: 97164- PT Re-evaluation, 97110-Therapeutic exercises, 97530- Therapeutic activity, 97112- Neuromuscular re-education, 97535- Self Care, 02859- Manual therapy, [id number]- Aquatic Therapy, H9716- Electrical stimulation (unattended), 97016- Vasopneumatic device, [id number]- Ultrasound, [id number]- Traction (mechanical), [id number]- Ionotophoresis 4mg /ml Dexamethasone , Patient/Family education, Joint mobilization, Scar mobilization, Cryotherapy, and Moist heat  PLAN FOR NEXT SESSION:    AAROM, AROM progression.  Check for updates in protocol regarding muscle activation progression.    Ozell Silvan, PT, DPT, OCS, ATC 01/13/24  10:11 AM

## 2024-01-16 ENCOUNTER — Encounter: Payer: Self-pay | Admitting: Radiology

## 2024-01-17 NOTE — Therapy (Signed)
 OUTPATIENT PHYSICAL THERAPY TREATMENT   Patient Name: Emily Hester MRN: [id number] DOB:Nov 15, 1970, 53 y.o., female Today's Date: 01/18/2024  END OF SESSION:  PT End of Session - 01/18/24 0933     Visit Number 11    Number of Visits 17    Date for Recertification  01/23/24    Authorization Type [employer]    PT Start Time [phone]    PT Stop Time 1012    PT Time Calculation (min) 39 min    Activity Tolerance Patient tolerated treatment well    Behavior During Therapy WFL for tasks assessed/performed              Past Medical History:  Diagnosis Date   Arthritis    patient denies   Asthma    Back pain    Chest pain    Constipation    Diabetes mellitus without complication (HCC)    type    Food allergy    GERD (gastroesophageal reflux disease)    Heartburn    Hyperlipidemia    Hypertension    Interstitial cystitis    IUD (intrauterine device) in place placed 6 weeks ago   Joint pain    Lactose intolerance    Migraines    MIgraines- not current     OSA (obstructive sleep apnea)    Osteoarthritis    Palpitations    Pneumonia    SOB (shortness of breath)    Swallowing problem    Vitamin D  deficiency    Past Surgical History:  Procedure Laterality Date   ANTERIOR HIP REVISION Right 05/20/2020   Procedure: RIGHT TOTAL HIP REVISION- ploy exhange and hip ball exchange with bone graft;  Surgeon: Vernetta Lonni GRADE, MD;  Location: [facility];  Service: Orthopedics;  Laterality: Right;   DILATION AND EVACUATION  09/30/2011   Procedure: DILATATION AND EVACUATION;  Surgeon: Lynwood FORBES Curlene PONCE, MD;  Location: [facility];  Service: Gynecology;  Laterality: N/A;   left knee surgery      arthroscopy    left shoulder surgery  11/24/2023   TOTAL HIP ARTHROPLASTY Right 03/11/2017   Procedure: RIGHT TOTAL HIP ARTHROPLASTY ANTERIOR APPROACH;  Surgeon: Vernetta Lonni GRADE, MD;  Location: [facility];  Service: Orthopedics;  Laterality: Right;   Patient Active Problem List    Diagnosis Date Noted   BMI 40.0-44.9, adult (HCC) current 40.05 10/20/2023   Vitamin D  deficiency 10/20/2023   Other fatigue 10/20/2023   Lactose intolerance 10/20/2023   DOE (dyspnea on exertion) 10/20/2023   Nut allergy 10/20/2023   Herpes simplex type 1 infection 10/13/2022   Chronic interstitial cystitis 10/13/2022   Morbid obesity (HCC) 10/13/2022   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HCC) 05/20/2020   Status post revision of total hip 05/20/2020   Trochanteric bursitis, right hip 09/05/2019   Hyperlipidemia 08/31/2019   Diabetes mellitus (HCC) 08/31/2019   Arthritis 08/31/2019   Overactive bladder 08/31/2019   Hypertensive disorder 08/30/2018   Asthma 08/30/2018   Migraine 08/30/2018   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   Unilateral primary osteoarthritis, right hip 01/27/2017   IUD (intrauterine device) in place 01/13/2017    PCP: [name], MD  REFERRING PROVIDER: Gretta Bertrum ORN, PA-C  REFERRING DIAG: [phone] (ICD-10-CM) - Chronic left shoulder pain   THERAPY DIAG:  Chronic left shoulder pain  Stiffness of left shoulder, not elsewhere classified  Muscle weakness (generalized)  Rationale for Evaluation and Treatment: Rehabilitation  ONSET DATE:   11/24/2023 surgery   SUBJECTIVE:  SUBJECTIVE STATEMENT: Patient endorsing stinging sensation in Lt UT area.   Hand dominance: Right  PERTINENT HISTORY: L shoulder injection 06/15/23  PAIN:   NPRS scale: 3/10  Pain location: left shoulder anterior Pain description: stinging, burning Aggravating factors: motion Relieving factors: ice  PRECAUTIONS: Lt Shoulder scope 11/24/23 AROM prn- follow biceps tenodesis protocol  RED FLAGS: None   WEIGHT BEARING RESTRICTIONS: No  FALLS:  Has patient fallen in  last 6 months? Unknown  LIVING ENVIRONMENT: Lives with: lives with their family Lives in: House/apartment Has following equipment at home: sling  OCCUPATION: Not working  PLOF: Independent  PATIENT GOALS: decrease pain  NEXT MD VISIT: 12/08/23  OBJECTIVE:  Note: Objective measures were completed at Evaluation unless otherwise noted.  DIAGNOSTIC FINDINGS:  MRI L Shoulder 05/19/23 IMPRESSION: 1. Mild supraspinatus tendinosis without a tear.  PATIENT SURVEYS :  PSFS: THE PATIENT SPECIFIC FUNCTIONAL SCALE  Place score of 0-10 (0 = unable to perform activity and 10 = able to perform activity at the same level as before injury or problem)  Activity Date: 11/28/23 01/13/2024   Do hairstyle 0 2   2.Pick up heavy items 0 4   3.Sleep on arm  0 4   4. Lift arm up 0 3   Total Score 0 3.25     Total Score = Sum of activity scores/number of activities  Minimally Detectable Change: 3 points (for single activity); 2 points (for average score)  [facility] (nd). The Patient Specific Functional Scale . Retrieved from [employer]com.pt   COGNITION: Eval   Overall cognitive status: Within functional limits for tasks assessed     SENSATION: Eval: WFL  POSTURE: Eval L shoulder higher, rounded shoulders moderate  UPPER EXTREMITY ROM:    AROM/PROM Left eval Left 9/172025 Supine  12/08/2023 12/15/23  01/13/2024 AROM in supine  Shoulder flexion 22/90 115 PROM 124deg PROM 132 AA supine 130  Shoulder extension 40/      Shoulder abduction 25/45 90 PROM 90deg PROM (painful)    Shoulder adduction       Shoulder internal rotation Crest/30 at 45    70 AROM in 45 deg abduction  Shoulder external rotation 15/20 30 PROM in 30 deg abduction 20deg PROM in 30deg abduction   65 AROM in 45 deg abduction  Elbow flexion       Elbow extension       Wrist flexion       Wrist extension       Wrist ulnar deviation       Wrist  radial deviation       Wrist pronation       Wrist supination       (Blank rows = not tested)  UPPER EXTREMITY MMT:  MMT Left eval   Shoulder flexion 2+   Shoulder extension 3   Shoulder abduction 2+   Shoulder adduction    Shoulder internal rotation 3   Shoulder external rotation 3   Middle trapezius    Lower trapezius    Elbow flexion    Elbow extension    Wrist flexion    Wrist extension    Wrist ulnar deviation    Wrist radial deviation    Wrist pronation    Wrist supination    Grip strength (lbs)    (Blank rows = not tested)  SHOULDER SPECIAL TESTS: Eval No special tests due to post op status  JOINT MOBILITY TESTING:  Eval Decreased throughout   PALPATION:  Eval 2+  tenderness at incisions and mid deltoid.                     TREATMENT        DATE:  01/18/2024 TherAct:  Pulleys 3 min into flexion, 3 min into scaption (limited)  Wall ladder into shoulder flexion x10, shoulder abduction x10 Supine shoulder flexion AAROM with 1# bar 2x10 with 2s hold  Supine shoulder ER AAROM with 1# bar 2x10 with 3s hold in max stretch (highly limited ROM)  Neuro Re-Ed:  Seated scapular retraction 2x10 with 5s hold  Supine scapular protraction with 1# bar , 2x10  Seated isometrics into ER, IR 1x6 each direction with 5s hold    TREATMENT        DATE:  01/13/2024 Therex: UBE fwd/back 3 mins each way lvl 2.5 for ROM with 1 min rest between directions.  HEP review with additions of wand based exercises at home discussed today.   Neuro Re-ed (muscle activation, control, coordination, scapular control) Supine AAROM scapular protraction press 3 sec hold x 10 1 lb bar  Seated scapular retraction 5 sec hold 2 x 10  Seated Lt arm at side isometric painfree press 5 sec hold x 6 for ER, IR.    TherActivity (to improve reach, elevation) UE ranger flexion AAROM into flexion with ipsilateral leg step in 2 x 15  Supine AAROM 1 lb bar flexion 2 x 10     TREATMENT        DATE:  01/05/24 TherEx:  UBE level 1 with bilat UE, 2 min fwd/2 min back Wall ladder into shoulder flexion 1x10 (up to 23) Reach to cabinet shelves (first and second) with AAROM up and AROM down, 1x5 with rest break in between  UE ranger into scaption/abduction 1x5 with PT assist for last 3 reps  Supine shoulder flexion AAROM with 1# bar 2x8 Supine shoulder abduction AAROM with PT assist, 2x5 with 10s hold (able to reach ~90deg)  TREATMENT        DATE: 12/22/23 UBE no resistance 2 min each way Wall ladder 8x  (up to #23) Seated thoracic rotation 20x Seated thoracic extension 20x Seated active cross opp shoulder touches 10x Supine wand flexion/abduction  2x10  Manual PROM/scap mobs      PATIENT EDUCATION: Education details: HEP Person educated: Patient Education method: Programmer, Multimedia, Facilities Manager, Actor cues, and Verbal cues Education comprehension: verbalized understanding  HOME EXERCISE PROGRAM: Access Code: [email or web address] URL: [email or web address] Date: 11/28/2023 Prepared by: Burnard Meth  Exercises - Seated Scapular Retraction  - 2 x daily - 7 x weekly - 2 sets - 10 reps - Seated Shoulder Shrugs  - 2 x daily - 7 x weekly - 2 sets - 10 reps - Supine Shoulder Flexion Extension AAROM with Dowel  - 2 x daily - 7 x weekly - 2 sets - 10 reps    ASSESSMENT:  CLINICAL IMPRESSION: Patient arrived to session noting intermittent stinging sensation in Lt shoulder, though overall seeing improvements with ADLs. Patient tolerated all activities this date with typical muscle fatigue and soreness seen. Patient will continue to benefit from skilled PT.  OBJECTIVE IMPAIRMENTS: decreased ROM, decreased strength, hypomobility, impaired UE functional use, and pain.   ACTIVITY LIMITATIONS: carrying, lifting, sleeping, bathing, dressing, self feeding, and reach over head  PARTICIPATION LIMITATIONS: meal prep, cleaning, laundry, driving, and community activity  PERSONAL FACTORS:  None- are also affecting patient's functional outcome.   REHAB POTENTIAL: Good  CLINICAL DECISION MAKING: Stable/uncomplicated  EVALUATION  COMPLEXITY: Moderate  GOALS: Goals reviewed with patient? Yes  SHORT TERM GOALS: Target date: 12/19/2023    Pt to be independent with HEP. Baseline: Goal status: METR 12/15/23  2.  Decreased pain by 1 level. Baseline:  Goal status: Ongoing   12/15/23   LONG TERM GOALS: Target date: 01/23/2024    Patient to be independent in self progressive HEP by discharge. Baseline:  Goal status: Ongoing   01/13/2024  2.  Increase active range of motion to WNL throughout. Baseline:  Goal status: Ongoing   01/13/2024  3.  Increase strength to at least 4/5 throughout shoulder. Baseline: 2+ > 3 Goal status: Ongoing   01/13/2024  4.  Reduce pain with all activities to 2 out of 10 or less. Baseline:  Goal status: Ongoing   01/13/2024  5.  Improve PSFS score by at least 2 full points for a measurable improvement. Baseline: 0 Goal status: Ongoing   01/13/2024  PLAN: PT FREQUENCY: 2x/week  PT DURATION: 8 weeks  PLANNED INTERVENTIONS: 97164- PT Re-evaluation, 97110-Therapeutic exercises, 97530- Therapeutic activity, 97112- Neuromuscular re-education, 97535- Self Care, 02859- Manual therapy, [id number]- Aquatic Therapy, H9716- Electrical stimulation (unattended), 97016- Vasopneumatic device, [id number]- Ultrasound, [id number]- Traction (mechanical), [id number]- Ionotophoresis 4mg /ml Dexamethasone , Patient/Family education, Joint mobilization, Scar mobilization, Cryotherapy, and Moist heat  PLAN FOR NEXT SESSION:     AAROM, AROM progression.  Check for updates in protocol regarding muscle activation progression.    Susannah Daring, PT, DPT 01/18/24 10:15 AM

## 2024-01-18 ENCOUNTER — Ambulatory Visit (INDEPENDENT_AMBULATORY_CARE_PROVIDER_SITE_OTHER)

## 2024-01-18 ENCOUNTER — Encounter (INDEPENDENT_AMBULATORY_CARE_PROVIDER_SITE_OTHER): Payer: Self-pay | Admitting: Nurse Practitioner

## 2024-01-18 DIAGNOSIS — G8929 Other chronic pain: Secondary | ICD-10-CM | POA: Diagnosis not present

## 2024-01-18 DIAGNOSIS — M25612 Stiffness of left shoulder, not elsewhere classified: Secondary | ICD-10-CM | POA: Diagnosis not present

## 2024-01-18 DIAGNOSIS — M25512 Pain in left shoulder: Secondary | ICD-10-CM

## 2024-01-18 DIAGNOSIS — M6281 Muscle weakness (generalized): Secondary | ICD-10-CM

## 2024-01-19 NOTE — Therapy (Signed)
 OUTPATIENT PHYSICAL THERAPY TREATMENT   Patient Name: Emily Hester MRN: [id number] DOB:1970/11/11, 53 y.o., female Today's Date: 01/20/2024  END OF SESSION:  PT End of Session - 01/20/24 0933     Visit Number 12    Number of Visits 17    Date for Recertification  01/23/24    Authorization Type [employer]    PT Start Time [phone]    PT Stop Time 1013    PT Time Calculation (min) 40 min    Activity Tolerance Patient tolerated treatment well    Behavior During Therapy WFL for tasks assessed/performed               Past Medical History:  Diagnosis Date   Arthritis    patient denies   Asthma    Back pain    Chest pain    Constipation    Diabetes mellitus without complication (HCC)    type    Food allergy    GERD (gastroesophageal reflux disease)    Heartburn    Hyperlipidemia    Hypertension    Interstitial cystitis    IUD (intrauterine device) in place placed 6 weeks ago   Joint pain    Lactose intolerance    Migraines    MIgraines- not current     OSA (obstructive sleep apnea)    Osteoarthritis    Palpitations    Pneumonia    SOB (shortness of breath)    Swallowing problem    Vitamin D  deficiency    Past Surgical History:  Procedure Laterality Date   ANTERIOR HIP REVISION Right 05/20/2020   Procedure: RIGHT TOTAL HIP REVISION- ploy exhange and hip ball exchange with bone graft;  Surgeon: Vernetta Lonni GRADE, MD;  Location: [facility];  Service: Orthopedics;  Laterality: Right;   DILATION AND EVACUATION  09/30/2011   Procedure: DILATATION AND EVACUATION;  Surgeon: Lynwood FORBES Curlene PONCE, MD;  Location: [facility];  Service: Gynecology;  Laterality: N/A;   left knee surgery      arthroscopy    left shoulder surgery  11/24/2023   TOTAL HIP ARTHROPLASTY Right 03/11/2017   Procedure: RIGHT TOTAL HIP ARTHROPLASTY ANTERIOR APPROACH;  Surgeon: Vernetta Lonni GRADE, MD;  Location: [facility];  Service: Orthopedics;  Laterality: Right;   Patient Active Problem List    Diagnosis Date Noted   BMI 40.0-44.9, adult (HCC) current 40.05 10/20/2023   Vitamin D  deficiency 10/20/2023   Other fatigue 10/20/2023   Lactose intolerance 10/20/2023   DOE (dyspnea on exertion) 10/20/2023   Nut allergy 10/20/2023   Herpes simplex type 1 infection 10/13/2022   Chronic interstitial cystitis 10/13/2022   Morbid obesity (HCC) 10/13/2022   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HCC) 05/20/2020   Status post revision of total hip 05/20/2020   Trochanteric bursitis, right hip 09/05/2019   Hyperlipidemia 08/31/2019   Diabetes mellitus (HCC) 08/31/2019   Arthritis 08/31/2019   Overactive bladder 08/31/2019   Hypertensive disorder 08/30/2018   Asthma 08/30/2018   Migraine 08/30/2018   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   Unilateral primary osteoarthritis, right hip 01/27/2017   IUD (intrauterine device) in place 01/13/2017    PCP: [name], MD  REFERRING PROVIDER: [name], MD  REFERRING DIAG: [phone] (ICD-10-CM) - Chronic left shoulder pain   THERAPY DIAG:  Chronic left shoulder pain  Stiffness of left shoulder, not elsewhere classified  Muscle weakness (generalized)  Rationale for Evaluation and Treatment: Rehabilitation  ONSET DATE:   11/24/2023 surgery   SUBJECTIVE:  SUBJECTIVE STATEMENT: Patient reports stinging sensation at a high level yesterday, but improved today.   Hand dominance: Right  PERTINENT HISTORY: L shoulder injection 06/15/23  PAIN:   NPRS scale: 2.5/10  Pain location: left shoulder anterior Pain description: stinging, burning Aggravating factors: motion Relieving factors: ice  PRECAUTIONS: Lt Shoulder scope 11/24/23 AROM prn- follow biceps tenodesis protocol  RED FLAGS: None   WEIGHT BEARING RESTRICTIONS:  No  FALLS:  Has patient fallen in last 6 months? Unknown  LIVING ENVIRONMENT: Lives with: lives with their family Lives in: House/apartment Has following equipment at home: sling  OCCUPATION: Not working  PLOF: Independent  PATIENT GOALS: decrease pain  NEXT MD VISIT: 12/08/23  OBJECTIVE:  Note: Objective measures were completed at Evaluation unless otherwise noted.  DIAGNOSTIC FINDINGS:  MRI L Shoulder 05/19/23 IMPRESSION: 1. Mild supraspinatus tendinosis without a tear.  PATIENT SURVEYS :  PSFS: THE PATIENT SPECIFIC FUNCTIONAL SCALE  Place score of 0-10 (0 = unable to perform activity and 10 = able to perform activity at the same level as before injury or problem)  Activity Date: 11/28/23 01/13/2024   Do hairstyle 0 2   2.Pick up heavy items 0 4   3.Sleep on arm  0 4   4. Lift arm up 0 3   Total Score 0 3.25     Total Score = Sum of activity scores/number of activities  Minimally Detectable Change: 3 points (for single activity); 2 points (for average score)  [facility] (nd). The Patient Specific Functional Scale . Retrieved from [email or web address]   COGNITION: Eval   Overall cognitive status: Within functional limits for tasks assessed     SENSATION: Eval: WFL  POSTURE: Eval L shoulder higher, rounded shoulders moderate  UPPER EXTREMITY ROM:    AROM/PROM Left eval Left 9/172025 Supine  12/08/2023 12/15/23  01/13/2024 AROM in supine  Shoulder flexion 22/90 115 PROM 124deg PROM 132 AA supine 130  Shoulder extension 40/      Shoulder abduction 25/45 90 PROM 90deg PROM (painful)    Shoulder adduction       Shoulder internal rotation Crest/30 at 45    70 AROM in 45 deg abduction  Shoulder external rotation 15/20 30 PROM in 30 deg abduction 20deg PROM in 30deg abduction   65 AROM in 45 deg abduction  Elbow flexion       Elbow extension       Wrist flexion       Wrist extension        Wrist ulnar deviation       Wrist radial deviation       Wrist pronation       Wrist supination       (Blank rows = not tested)  UPPER EXTREMITY MMT:  MMT Left eval   Shoulder flexion 2+   Shoulder extension 3   Shoulder abduction 2+   Shoulder adduction    Shoulder internal rotation 3   Shoulder external rotation 3   Middle trapezius    Lower trapezius    Elbow flexion    Elbow extension    Wrist flexion    Wrist extension    Wrist ulnar deviation    Wrist radial deviation    Wrist pronation    Wrist supination    Grip strength (lbs)    (Blank rows = not tested)  SHOULDER SPECIAL TESTS: Eval No special tests due to post op status  JOINT MOBILITY TESTING:  Eval Decreased throughout  PALPATION:  Eval 2+ tenderness at incisions and mid deltoid.                     TREATMENT        DATE:  01/20/2024 TherAct:  Pulleys 3 min into flexion, 3 min into scaption (limited)  Highly fatiguing  Wall ladder into shoulder flexion x10, shoulder abduction x10 Supine AAROM abduction with PT assist 1x5 with 10s hold in max stretch, 1x3 with 10s hold in max stretch  Neuro Re-Ed:  IR walkouts with red TB 2x5 with 2-3s hold  ER walkouts with red TB 2x5 with 2-3s hold    TREATMENT        DATE:  01/18/2024 TherAct:  Pulleys 3 min into flexion, 3 min into scaption (limited)  Wall ladder into shoulder flexion x10, shoulder abduction x10 Supine shoulder flexion AAROM with 1# bar 2x10 with 2s hold  Supine shoulder ER AAROM with 1# bar 2x10 with 3s hold in max stretch (highly limited ROM)  Neuro Re-Ed:  Seated scapular retraction 2x10 with 5s hold  Supine scapular protraction with 1# bar , 2x10  Seated isometrics into ER, IR 1x6 each direction with 5s hold    TREATMENT        DATE:  01/13/2024 Therex: UBE fwd/back 3 mins each way lvl 2.5 for ROM with 1 min rest between directions.  HEP review with additions of wand based exercises at home discussed today.   Neuro Re-ed  (muscle activation, control, coordination, scapular control) Supine AAROM scapular protraction press 3 sec hold x 10 1 lb bar  Seated scapular retraction 5 sec hold 2 x 10  Seated Lt arm at side isometric painfree press 5 sec hold x 6 for ER, IR.    TherActivity (to improve reach, elevation) UE ranger flexion AAROM into flexion with ipsilateral leg step in 2 x 15  Supine AAROM 1 lb bar flexion 2 x 10     TREATMENT        DATE: 01/05/24 TherEx:  UBE level 1 with bilat UE, 2 min fwd/2 min back Wall ladder into shoulder flexion 1x10 (up to 23) Reach to cabinet shelves (first and second) with AAROM up and AROM down, 1x5 with rest break in between  UE ranger into scaption/abduction 1x5 with PT assist for last 3 reps  Supine shoulder flexion AAROM with 1# bar 2x8 Supine shoulder abduction AAROM with PT assist, 2x5 with 10s hold (able to reach ~90deg)       PATIENT EDUCATION: Education details: HEP Person educated: Patient Education method: Explanation, Demonstration, Tactile cues, and Verbal cues Education comprehension: verbalized understanding  HOME EXERCISE PROGRAM: Access Code: [email or web address] URL: [email or web address] Date: 11/28/2023 Prepared by: Burnard Meth  Exercises - Seated Scapular Retraction  - 2 x daily - 7 x weekly - 2 sets - 10 reps - Seated Shoulder Shrugs  - 2 x daily - 7 x weekly - 2 sets - 10 reps - Supine Shoulder Flexion Extension AAROM with Dowel  - 2 x daily - 7 x weekly - 2 sets - 10 reps    ASSESSMENT:  CLINICAL IMPRESSION:  Patient arrived to session noting increased stinging yesterday but has improved over last 24 hours. Patient tolerated all activities this date, though continues to be highly fatigued with most activities.  Patient will continue to benefit from skilled PT.  OBJECTIVE IMPAIRMENTS: decreased ROM, decreased strength, hypomobility, impaired UE functional use, and pain.   ACTIVITY LIMITATIONS: carrying, lifting, sleeping,  bathing,  dressing, self feeding, and reach over head  PARTICIPATION LIMITATIONS: meal prep, cleaning, laundry, driving, and community activity  PERSONAL FACTORS: None- are also affecting patient's functional outcome.   REHAB POTENTIAL: Good  CLINICAL DECISION MAKING: Stable/uncomplicated  EVALUATION COMPLEXITY: Moderate  GOALS: Goals reviewed with patient? Yes  SHORT TERM GOALS: Target date: 12/19/2023    Pt to be independent with HEP. Baseline: Goal status: METR 12/15/23  2.  Decreased pain by 1 level. Baseline:  Goal status: Ongoing   12/15/23   LONG TERM GOALS: Target date: 01/23/2024    Patient to be independent in self progressive HEP by discharge. Baseline:  Goal status: Ongoing   01/13/2024  2.  Increase active range of motion to WNL throughout. Baseline:  Goal status: Ongoing   01/13/2024  3.  Increase strength to at least 4/5 throughout shoulder. Baseline: 2+ > 3 Goal status: Ongoing   01/13/2024  4.  Reduce pain with all activities to 2 out of 10 or less. Baseline:  Goal status: Ongoing   01/13/2024  5.  Improve PSFS score by at least 2 full points for a measurable improvement. Baseline: 0 Goal status: Ongoing   01/13/2024  PLAN: PT FREQUENCY: 2x/week  PT DURATION: 8 weeks  PLANNED INTERVENTIONS: 97164- PT Re-evaluation, 97110-Therapeutic exercises, 97530- Therapeutic activity, 97112- Neuromuscular re-education, 97535- Self Care, 02859- Manual therapy, [id number]- Aquatic Therapy, H9716- Electrical stimulation (unattended), 97016- Vasopneumatic device, [id number]- Ultrasound, [id number]- Traction (mechanical), [id number]- Ionotophoresis 4mg /ml Dexamethasone , Patient/Family education, Joint mobilization, Scar mobilization, Cryotherapy, and Moist heat  PLAN FOR NEXT SESSION:     RECERTIFICATION,  AAROM, AROM progression.  Check for updates in protocol regarding muscle activation progression. Add isometrics to HEP (?)   Susannah Daring, PT, DPT 01/20/24 10:16 AM

## 2024-01-20 ENCOUNTER — Ambulatory Visit (INDEPENDENT_AMBULATORY_CARE_PROVIDER_SITE_OTHER)

## 2024-01-20 DIAGNOSIS — M25512 Pain in left shoulder: Secondary | ICD-10-CM

## 2024-01-20 DIAGNOSIS — G8929 Other chronic pain: Secondary | ICD-10-CM | POA: Diagnosis not present

## 2024-01-20 DIAGNOSIS — M6281 Muscle weakness (generalized): Secondary | ICD-10-CM | POA: Diagnosis not present

## 2024-01-20 DIAGNOSIS — M25612 Stiffness of left shoulder, not elsewhere classified: Secondary | ICD-10-CM

## 2024-01-23 NOTE — Therapy (Incomplete)
 OUTPATIENT PHYSICAL THERAPY TREATMENT   Patient Name: Emily Hester MRN: [id number] DOB:11-Jan-1971, 53 y.o., female Today's Date: 01/23/2024  END OF SESSION:RECERT         Past Medical History:  Diagnosis Date   Arthritis    patient denies   Asthma    Back pain    Chest pain    Constipation    Diabetes mellitus without complication (HCC)    type    Food allergy    GERD (gastroesophageal reflux disease)    Heartburn    Hyperlipidemia    Hypertension    Interstitial cystitis    IUD (intrauterine device) in place placed 6 weeks ago   Joint pain    Lactose intolerance    Migraines    MIgraines- not current     OSA (obstructive sleep apnea)    Osteoarthritis    Palpitations    Pneumonia    SOB (shortness of breath)    Swallowing problem    Vitamin D  deficiency    Past Surgical History:  Procedure Laterality Date   ANTERIOR HIP REVISION Right 05/20/2020   Procedure: RIGHT TOTAL HIP REVISION- ploy exhange and hip ball exchange with bone graft;  Surgeon: Emily Lonni GRADE, MD;  Location: [facility];  Service: Orthopedics;  Laterality: Right;   DILATION AND EVACUATION  09/30/2011   Procedure: DILATATION AND EVACUATION;  Surgeon: Emily FORBES Curlene PONCE, MD;  Location: [facility];  Service: Gynecology;  Laterality: N/A;   left knee surgery      arthroscopy    left shoulder surgery  11/24/2023   TOTAL HIP ARTHROPLASTY Right 03/11/2017   Procedure: RIGHT TOTAL HIP ARTHROPLASTY ANTERIOR APPROACH;  Surgeon: Emily Lonni GRADE, MD;  Location: [facility];  Service: Orthopedics;  Laterality: Right;   Patient Active Problem List   Diagnosis Date Noted   BMI 40.0-44.9, adult (HCC) current 40.05 10/20/2023   Vitamin D  deficiency 10/20/2023   Other fatigue 10/20/2023   Lactose intolerance 10/20/2023   DOE (dyspnea on exertion) 10/20/2023   Nut allergy 10/20/2023   Herpes simplex type 1 infection 10/13/2022   Chronic interstitial cystitis 10/13/2022   Morbid obesity (HCC)  10/13/2022   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HCC) 05/20/2020   Status post revision of total hip 05/20/2020   Trochanteric bursitis, right hip 09/05/2019   Hyperlipidemia 08/31/2019   Diabetes mellitus (HCC) 08/31/2019   Arthritis 08/31/2019   Overactive bladder 08/31/2019   Hypertensive disorder 08/30/2018   Asthma 08/30/2018   Migraine 08/30/2018   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   Unilateral primary osteoarthritis, right hip 01/27/2017   IUD (intrauterine device) in place 01/13/2017    PCP: Emily Pellet, MD  REFERRING PROVIDER: [name], MD  REFERRING DIAG: [phone] (ICD-10-CM) - Chronic left shoulder pain   THERAPY DIAG:  No diagnosis found.  Rationale for Evaluation and Treatment: Rehabilitation  ONSET DATE:   11/24/2023 surgery   SUBJECTIVE:  SUBJECTIVE STATEMENT: ***Patient reports stinging sensation at a high level yesterday, but improved today.   Hand dominance: Right  PERTINENT HISTORY: L shoulder injection 06/15/23  PAIN:  *** NPRS scale: 2.5/10  Pain location: left shoulder anterior Pain description: stinging, burning Aggravating factors: motion Relieving factors: ice  PRECAUTIONS: Lt Shoulder scope 11/24/23 AROM prn- follow biceps tenodesis protocol  RED FLAGS: None   WEIGHT BEARING RESTRICTIONS: No  FALLS:  Has patient fallen in last 6 months? Unknown  LIVING ENVIRONMENT: Lives with: lives with their family Lives in: House/apartment Has following equipment at home: sling  OCCUPATION: Not working  PLOF: Independent  PATIENT GOALS: decrease pain  NEXT MD VISIT: 12/08/23  OBJECTIVE:  Note: Objective measures were completed at Evaluation unless otherwise noted.  DIAGNOSTIC FINDINGS:  MRI L Shoulder  05/19/23 IMPRESSION: 1. Mild supraspinatus tendinosis without a tear.  PATIENT SURVEYS :  PSFS: THE PATIENT SPECIFIC FUNCTIONAL SCALE  Place score of 0-10 (0 = unable to perform activity and 10 = able to perform activity at the same level as before injury or problem)  Activity Date: 11/28/23 01/13/2024   Do hairstyle 0 2   2.Pick up heavy items 0 4   3.Sleep on arm  0 4   4. Lift arm up 0 3   Total Score 0 3.25     Total Score = Sum of activity scores/number of activities  Minimally Detectable Change: 3 points (for single activity); 2 points (for average score)  [facility] (nd). The Patient Specific Functional Scale . Retrieved from [employer]com.[email or web address]   COGNITION: Eval   Overall cognitive status: Within functional limits for tasks assessed     SENSATION: Eval: WFL  POSTURE: Eval L shoulder higher, rounded shoulders moderate  UPPER EXTREMITY ROM:    AROM/PROM Left eval Left 9/172025 Supine  12/08/2023 12/15/23  01/13/2024 AROM in supine  Shoulder flexion 22/90 115 PROM 124deg PROM 132 AA supine 130  Shoulder extension 40/      Shoulder abduction 25/45 90 PROM 90deg PROM (painful)    Shoulder adduction       Shoulder internal rotation Crest/30 at 45    70 AROM in 45 deg abduction  Shoulder external rotation 15/20 30 PROM in 30 deg abduction 20deg PROM in 30deg abduction   65 AROM in 45 deg abduction  Elbow flexion       Elbow extension       Wrist flexion       Wrist extension       Wrist ulnar deviation       Wrist radial deviation       Wrist pronation       Wrist supination       (Blank rows = not tested)  UPPER EXTREMITY MMT:  MMT Left eval   Shoulder flexion 2+   Shoulder extension 3   Shoulder abduction 2+   Shoulder adduction    Shoulder internal rotation 3   Shoulder external rotation 3   Middle trapezius    Lower trapezius    Elbow flexion    Elbow extension    Wrist flexion     Wrist extension    Wrist ulnar deviation    Wrist radial deviation    Wrist pronation    Wrist supination    Grip strength (lbs)    (Blank rows = not tested)  SHOULDER SPECIAL TESTS: Eval No special tests due to post op status  JOINT MOBILITY TESTING:  Eval Decreased throughout  PALPATION:  Eval 2+ tenderness at incisions and mid deltoid.                     TREATMENT        DATE:   01/23/24***      01/20/2024 TherAct:  Pulleys 3 min into flexion, 3 min into scaption (limited)  Highly fatiguing  Wall ladder into shoulder flexion x10, shoulder abduction x10 Supine AAROM abduction with PT assist 1x5 with 10s hold in max stretch, 1x3 with 10s hold in max stretch  Neuro Re-Ed:  IR walkouts with red TB 2x5 with 2-3s hold  ER walkouts with red TB 2x5 with 2-3s hold    TREATMENT        DATE:  01/18/2024 TherAct:  Pulleys 3 min into flexion, 3 min into scaption (limited)  Wall ladder into shoulder flexion x10, shoulder abduction x10 Supine shoulder flexion AAROM with 1# bar 2x10 with 2s hold  Supine shoulder ER AAROM with 1# bar 2x10 with 3s hold in max stretch (highly limited ROM)  Neuro Re-Ed:  Seated scapular retraction 2x10 with 5s hold  Supine scapular protraction with 1# bar , 2x10  Seated isometrics into ER, IR 1x6 each direction with 5s hold    TREATMENT        DATE:  01/13/2024 Therex: UBE fwd/back 3 mins each way lvl 2.5 for ROM with 1 min rest between directions.  HEP review with additions of wand based exercises at home discussed today.   Neuro Re-ed (muscle activation, control, coordination, scapular control) Supine AAROM scapular protraction press 3 sec hold x 10 1 lb bar  Seated scapular retraction 5 sec hold 2 x 10  Seated Lt arm at side isometric painfree press 5 sec hold x 6 for ER, IR.    TherActivity (to improve reach, elevation) UE ranger flexion AAROM into flexion with ipsilateral leg step in 2 x 15  Supine AAROM 1 lb bar flexion 2 x  10     TREATMENT        DATE: 01/05/24 TherEx:  UBE level 1 with bilat UE, 2 min fwd/2 min back Wall ladder into shoulder flexion 1x10 (up to 23) Reach to cabinet shelves (first and second) with AAROM up and AROM down, 1x5 with rest break in between  UE ranger into scaption/abduction 1x5 with PT assist for last 3 reps  Supine shoulder flexion AAROM with 1# bar 2x8 Supine shoulder abduction AAROM with PT assist, 2x5 with 10s hold (able to reach ~90deg)       PATIENT EDUCATION: Education details: HEP Person educated: Patient Education method: Explanation, Demonstration, Tactile cues, and Verbal cues Education comprehension: verbalized understanding  HOME EXERCISE PROGRAM: Access Code: [email or web address] URL: [email or web address] Date: 11/28/2023 Prepared by: Burnard Meth  Exercises - Seated Scapular Retraction  - 2 x daily - 7 x weekly - 2 sets - 10 reps - Seated Shoulder Shrugs  - 2 x daily - 7 x weekly - 2 sets - 10 reps - Supine Shoulder Flexion Extension AAROM with Dowel  - 2 x daily - 7 x weekly - 2 sets - 10 reps    ASSESSMENT:  CLINICAL IMPRESSION:  ***Patient arrived to session noting increased stinging yesterday but has improved over last 24 hours. Patient tolerated all activities this date, though continues to be highly fatigued with most activities.  Patient will continue to benefit from skilled PT.  OBJECTIVE IMPAIRMENTS: decreased ROM, decreased strength, hypomobility, impaired UE functional use, and pain.  ACTIVITY LIMITATIONS: carrying, lifting, sleeping, bathing, dressing, self feeding, and reach over head  PARTICIPATION LIMITATIONS: meal prep, cleaning, laundry, driving, and community activity  PERSONAL FACTORS: None- are also affecting patient's functional outcome.   REHAB POTENTIAL: Good  CLINICAL DECISION MAKING: Stable/uncomplicated  EVALUATION COMPLEXITY: Moderate  GOALS: Goals reviewed with patient? Yes  SHORT TERM GOALS: Target  date: 12/19/2023    Pt to be independent with HEP. Baseline: Goal status: METR 12/15/23  2.  Decreased pain by 1 level. Baseline:  Goal status: Ongoing   12/15/23   LONG TERM GOALS: Target date: 01/23/2024***    Patient to be independent in self progressive HEP by discharge. Baseline:  Goal status: Ongoing   01/13/2024  2.  Increase active range of motion to WNL throughout. Baseline:  Goal status: Ongoing   01/13/2024  3.  Increase strength to at least 4/5 throughout shoulder. Baseline: 2+ > 3 Goal status: Ongoing   01/13/2024  4.  Reduce pain with all activities to 2 out of 10 or less. Baseline:  Goal status: Ongoing   01/13/2024  5.  Improve PSFS score by at least 2 full points for a measurable improvement. Baseline: 0 Goal status: Ongoing   01/13/2024  PLAN: PT FREQUENCY: 2x/week  PT DURATION: 8 weeks  PLANNED INTERVENTIONS: 97164- PT Re-evaluation, 97110-Therapeutic exercises, 97530- Therapeutic activity, 97112- Neuromuscular re-education, 97535- Self Care, 02859- Manual therapy, [id number]- Aquatic Therapy, H9716- Electrical stimulation (unattended), 97016- Vasopneumatic device, [id number]- Ultrasound, [id number]- Traction (mechanical), [id number]- Ionotophoresis 4mg /ml Dexamethasone , Patient/Family education, Joint mobilization, Scar mobilization, Cryotherapy, and Moist heat  PLAN FOR NEXT SESSION:     ***RECERTIFICATION,  AAROM, AROM progression.  Check for updates in protocol regarding muscle activation progression. Add isometrics to HEP (?)   Burnard Meth, PT 01/23/24  1:13 PM

## 2024-01-24 ENCOUNTER — Encounter

## 2024-01-25 NOTE — Therapy (Signed)
 OUTPATIENT PHYSICAL THERAPY TREATMENT/PROGRESS NOTE/RECERTIFICATION   Patient Name: Emily Hester MRN: [id number] DOB:02-27-1971, 53 y.o., female Today's Date: 01/26/2024  END OF SESSION:RECERT  PT End of Session - 01/26/24 1615     Visit Number 13    Number of Visits 33    Date for Recertification  03/22/24    PT Start Time 0930    PT Stop Time 1010    PT Time Calculation (min) 40 min    Activity Tolerance Patient tolerated treatment well    Behavior During Therapy WFL for tasks assessed/performed                Past Medical History:  Diagnosis Date   Arthritis    patient denies   Asthma    Back pain    Chest pain    Constipation    Diabetes mellitus without complication (HCC)    type    Food allergy    GERD (gastroesophageal reflux disease)    Heartburn    Hyperlipidemia    Hypertension    Interstitial cystitis    IUD (intrauterine device) in place placed 6 weeks ago   Joint pain    Lactose intolerance    Migraines    MIgraines- not current     OSA (obstructive sleep apnea)    Osteoarthritis    Palpitations    Pneumonia    SOB (shortness of breath)    Swallowing problem    Vitamin D  deficiency    Past Surgical History:  Procedure Laterality Date   ANTERIOR HIP REVISION Right 05/20/2020   Procedure: RIGHT TOTAL HIP REVISION- ploy exhange and hip ball exchange with bone graft;  Surgeon: Vernetta Lonni GRADE, MD;  Location: [facility];  Service: Orthopedics;  Laterality: Right;   DILATION AND EVACUATION  09/30/2011   Procedure: DILATATION AND EVACUATION;  Surgeon: Lynwood FORBES Curlene PONCE, MD;  Location: [facility];  Service: Gynecology;  Laterality: N/A;   left knee surgery      arthroscopy    left shoulder surgery  11/24/2023   TOTAL HIP ARTHROPLASTY Right 03/11/2017   Procedure: RIGHT TOTAL HIP ARTHROPLASTY ANTERIOR APPROACH;  Surgeon: Vernetta Lonni GRADE, MD;  Location: [facility];  Service: Orthopedics;  Laterality: Right;   Patient Active Problem  List   Diagnosis Date Noted   BMI 40.0-44.9, adult (HCC) current 40.05 10/20/2023   Vitamin D  deficiency 10/20/2023   Other fatigue 10/20/2023   Lactose intolerance 10/20/2023   DOE (dyspnea on exertion) 10/20/2023   Nut allergy 10/20/2023   Herpes simplex type 1 infection 10/13/2022   Chronic interstitial cystitis 10/13/2022   Morbid obesity (HCC) 10/13/2022   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HCC) 05/20/2020   Status post revision of total hip 05/20/2020   Trochanteric bursitis, right hip 09/05/2019   Hyperlipidemia 08/31/2019   Diabetes mellitus (HCC) 08/31/2019   Arthritis 08/31/2019   Overactive bladder 08/31/2019   Hypertensive disorder 08/30/2018   Asthma 08/30/2018   Migraine 08/30/2018   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   Unilateral primary osteoarthritis, right hip 01/27/2017   IUD (intrauterine device) in place 01/13/2017    PCP: [name], MD  REFERRING PROVIDER: Genelle Standing, MD  REFERRING DIAG: [phone] (ICD-10-CM) - Chronic left shoulder pain   THERAPY DIAG:  Chronic left shoulder pain  Stiffness of left shoulder, not elsewhere classified  Muscle weakness (generalized)  Rationale for Evaluation and Treatment: Rehabilitation  ONSET DATE:   11/24/2023 surgery   SUBJECTIVE:  SUBJECTIVE STATEMENT: Patient reports shoulder feels  a little achy today.   Hand dominance: Right  PERTINENT HISTORY: L shoulder injection 06/15/23  PAIN:   NPRS scale: 2/10  Pain location: left shoulder anterior Pain description: stinging, burning Aggravating factors: motion Relieving factors: ice  PRECAUTIONS: Lt Shoulder scope 11/24/23 AROM prn- follow biceps tenodesis protocol  RED FLAGS: None   WEIGHT BEARING RESTRICTIONS: No  FALLS:  Has patient  fallen in last 6 months? Unknown  LIVING ENVIRONMENT: Lives with: lives with their family Lives in: House/apartment Has following equipment at home: sling  OCCUPATION: Not working  PLOF: Independent  PATIENT GOALS: decrease pain  NEXT MD VISIT: 12/08/23  OBJECTIVE:  Note: Objective measures were completed at Evaluation unless otherwise noted.  DIAGNOSTIC FINDINGS:  MRI L Shoulder 05/19/23 IMPRESSION: 1. Mild supraspinatus tendinosis without a tear.  PATIENT SURVEYS :  PSFS: THE PATIENT SPECIFIC FUNCTIONAL SCALE  Place score of 0-10 (0 = unable to perform activity and 10 = able to perform activity at the same level as before injury or problem)  Activity Date: 11/28/23 01/13/2024 01/26/24  Do hairstyle 0 2 4  2.Pick up heavy items 0 4 Gal milk 0  3.Sleep on arm  0 4 0  4. Lift arm up 0 3 4  Total Score 0 3.25 2    Total Score = Sum of activity scores/number of activities  Minimally Detectable Change: 3 points (for single activity); 2 points (for average score)  [facility] (nd). The Patient Specific Functional Scale . Retrieved from [email or web address]   COGNITION: Eval   Overall cognitive status: Within functional limits for tasks assessed     SENSATION: Eval: WFL  POSTURE: Eval L shoulder higher, rounded shoulders moderate  UPPER EXTREMITY ROM:    AROM/PROM Left eval Left 9/172025 Supine  12/08/2023 12/15/23  01/13/2024 AROM in supine 01/26/24 AROM in supine  Shoulder flexion 22/90 115 PROM 124deg PROM 132 AA supine 130 138  Shoulder extension 40/       Shoulder abduction 25/45 90 PROM 90deg PROM (painful)   98 then P  Shoulder adduction        Shoulder internal rotation Crest/30 at 45    70 AROM in 45 deg abduction Sacrum  Shoulder external rotation 15/20 30 PROM in 30 deg abduction 20deg PROM in 30deg abduction   65 AROM in 45 deg abduction 35 in 45 deg abduction  Elbow flexion         Elbow extension        Wrist flexion        Wrist extension        Wrist ulnar deviation        Wrist radial deviation        Wrist pronation        Wrist supination        (Blank rows = not tested)  UPPER EXTREMITY MMT:  MMT Left eval LEFT  01/26/24  Shoulder flexion 2+ 3-  Shoulder extension 3 3+  Shoulder abduction 2+ 3-  Shoulder adduction    Shoulder internal rotation 3 3+  Shoulder external rotation 3 3  Middle trapezius    Lower trapezius    Elbow flexion    Elbow extension    Wrist flexion    Wrist extension    Wrist ulnar deviation    Wrist radial deviation    Wrist pronation    Wrist supination    Grip strength (lbs)    (Blank rows =  not tested)  SHOULDER SPECIAL TESTS: Eval No special tests due to post op status  JOINT MOBILITY TESTING:  Eval Decreased throughout   PALPATION:  Eval 2+ tenderness at incisions and mid deltoid.                     TREATMENT        DATE:   01/26/24 TherAct:  UBE 2/2 Wall ladder into shoulder flexion x10, shoulder abduction x5 Supine AAROM abduction with PT assist  Neuro Re-Ed:  IR walkouts with red TB 10x with 2-3s hold  ER walkouts with red TB 10x with 2-3s hold  TB rows green  2x10   01/20/2024 TherAct:  Pulleys 3 min into flexion, 3 min into scaption (limited)  Highly fatiguing  Wall ladder into shoulder flexion x10, shoulder abduction x10 Supine AAROM abduction with PT assist 1x5 with 10s hold in max stretch, 1x3 with 10s hold in max stretch  Neuro Re-Ed:  IR walkouts with red TB 2x5 with 2-3s hold  ER walkouts with red TB 2x5 with 2-3s hold    TREATMENT        DATE:  01/18/2024 TherAct:  Pulleys 3 min into flexion, 3 min into scaption (limited)  Wall ladder into shoulder flexion x10, shoulder abduction x10 Supine shoulder flexion AAROM with 1# bar 2x10 with 2s hold  Supine shoulder ER AAROM with 1# bar 2x10 with 3s hold in max stretch (highly limited ROM)  Neuro Re-Ed:  Seated scapular  retraction 2x10 with 5s hold  Supine scapular protraction with 1# bar , 2x10  Seated isometrics into ER, IR 1x6 each direction with 5s hold    TREATMENT        DATE:  01/13/2024 Therex: UBE fwd/back 3 mins each way lvl 2.5 for ROM with 1 min rest between directions.  HEP review with additions of wand based exercises at home discussed today.   Neuro Re-ed (muscle activation, control, coordination, scapular control) Supine AAROM scapular protraction press 3 sec hold x 10 1 lb bar  Seated scapular retraction 5 sec hold 2 x 10  Seated Lt arm at side isometric painfree press 5 sec hold x 6 for ER, IR.    TherActivity (to improve reach, elevation) UE ranger flexion AAROM into flexion with ipsilateral leg step in 2 x 15  Supine AAROM 1 lb bar flexion 2 x 10     TREATMENT        DATE: 01/05/24 TherEx:  UBE level 1 with bilat UE, 2 min fwd/2 min back Wall ladder into shoulder flexion 1x10 (up to 23) Reach to cabinet shelves (first and second) with AAROM up and AROM down, 1x5 with rest break in between  UE ranger into scaption/abduction 1x5 with PT assist for last 3 reps  Supine shoulder flexion AAROM with 1# bar 2x8 Supine shoulder abduction AAROM with PT assist, 2x5 with 10s hold (able to reach ~90deg)       PATIENT EDUCATION: Education details: HEP Person educated: Patient Education method: Explanation, Demonstration, Tactile cues, and Verbal cues Education comprehension: verbalized understanding  HOME EXERCISE PROGRAM: Access Code: [email or web address] URL: [email or web address] Date: 11/28/2023 Prepared by: Burnard Meth  Exercises - Seated Scapular Retraction  - 2 x daily - 7 x weekly - 2 sets - 10 reps - Seated Shoulder Shrugs  - 2 x daily - 7 x weekly - 2 sets - 10 reps - Supine Shoulder Flexion Extension AAROM with Dowel  - 2 x daily - 7 x  weekly - 2 sets - 10 reps    ASSESSMENT:  CLINICAL IMPRESSION:  Patient has had 13 sessions since IE and has progressed in  ROM and strength but very slowly as limited by pain.  She still presents with decreased ROM, decreased ADL ability, decreased strength and pain.  She would benefit from continued skilled PT to address these deficits.   OBJECTIVE IMPAIRMENTS: decreased ROM, decreased strength, hypomobility, impaired UE functional use, and pain.   ACTIVITY LIMITATIONS: carrying, lifting, sleeping, bathing, dressing, self feeding, and reach over head  PARTICIPATION LIMITATIONS: meal prep, cleaning, laundry, driving, and community activity  PERSONAL FACTORS: None- are also affecting patient's functional outcome.   REHAB POTENTIAL: Good  CLINICAL DECISION MAKING: Stable/uncomplicated  EVALUATION COMPLEXITY: Moderate  GOALS: Goals reviewed with patient? Yes  SHORT TERM GOALS: Target date: 12/19/2023  MET    Pt to be independent with HEP. Baseline: Goal status: MET 12/15/23  2.  Decreased pain by 1 level. Baseline:  Goal status: MET 01/26/24  LONG TERM GOALS: Target date: 03/22/2024 8 more weeks      Patient to be independent in self progressive HEP by discharge. Baseline:  Goal status: Ongoing  01/26/24  2.  Increase active range of motion to WNL throughout. Baseline:  Goal status: Ongoing  01/26/24  3.  Increase strength to at least 4/5 throughout shoulder. Baseline: 2+ > 3 Goal status: Ongoing  01/26/24  4.  Reduce pain with all activities to 2 out of 10 or less. Baseline:  Goal status: Ongoing  01/26/24  5.  Improve PSFS score by at least 2 full points for a measurable improvement. Baseline: 0 Goal status: Ongoing  01/26/24  PLAN: PT FREQUENCY: 2x/week  (16 visits + previous auth 17)  PT DURATION: 8 weeks  PLANNED INTERVENTIONS: 97164- PT Re-evaluation, 97110-Therapeutic exercises, 97530- Therapeutic activity, [id number]- Neuromuscular re-education, 97535- Self Care, 02859- Manual therapy, [id number]- Aquatic Therapy, H9716- Electrical stimulation (unattended), 97016- Vasopneumatic device,  [id number]- Ultrasound, [id number]- Traction (mechanical), [id number]- Ionotophoresis 4mg /ml Dexamethasone , Patient/Family education, Joint mobilization, Scar mobilization, Cryotherapy, and Moist heat  PLAN FOR NEXT SESSION:   Continue with progression of ROM and strength.    Burnard Meth, PT 01/26/24  4:26 PM

## 2024-01-26 ENCOUNTER — Ambulatory Visit (INDEPENDENT_AMBULATORY_CARE_PROVIDER_SITE_OTHER)

## 2024-01-26 DIAGNOSIS — M6281 Muscle weakness (generalized): Secondary | ICD-10-CM | POA: Diagnosis not present

## 2024-01-26 DIAGNOSIS — G8929 Other chronic pain: Secondary | ICD-10-CM

## 2024-01-26 DIAGNOSIS — M25612 Stiffness of left shoulder, not elsewhere classified: Secondary | ICD-10-CM | POA: Diagnosis not present

## 2024-01-26 DIAGNOSIS — M25512 Pain in left shoulder: Secondary | ICD-10-CM | POA: Diagnosis not present

## 2024-01-30 NOTE — Therapy (Signed)
 OUTPATIENT PHYSICAL THERAPY TREATMENT   Patient Name: Emily Hester MRN: [id number] DOB:[birth year], 53 y.o., female Today's Date: 01/31/2024  END OF SESSION:  PT End of Session - 01/31/24 0856     Visit Number 14    Number of Visits 33    Date for Recertification  03/22/24    Authorization Type [employer]    PT Start Time [phone]    PT Stop Time 0913    PT Time Calculation (min) 38 min    Activity Tolerance Patient tolerated treatment well    Behavior During Therapy WFL for tasks assessed/performed                 Past Medical History:  Diagnosis Date   Arthritis    patient denies   Asthma    Back pain    Chest pain    Constipation    Diabetes mellitus without complication (HCC)    type    Food allergy    GERD (gastroesophageal reflux disease)    Heartburn    Hyperlipidemia    Hypertension    Interstitial cystitis    IUD (intrauterine device) in place placed 6 weeks ago   Joint pain    Lactose intolerance    Migraines    MIgraines- not current     OSA (obstructive sleep apnea)    Osteoarthritis    Palpitations    Pneumonia    SOB (shortness of breath)    Swallowing problem    Vitamin D  deficiency    Past Surgical History:  Procedure Laterality Date   ANTERIOR HIP REVISION Right 05/20/2020   Procedure: RIGHT TOTAL HIP REVISION- ploy exhange and hip ball exchange with bone graft;  Surgeon: Vernetta Lonni GRADE, MD;  Location: [facility];  Service: Orthopedics;  Laterality: Right;   DILATION AND EVACUATION  09/30/2011   Procedure: DILATATION AND EVACUATION;  Surgeon: Lynwood FORBES Curlene PONCE, MD;  Location: [facility];  Service: Gynecology;  Laterality: N/A;   left knee surgery      arthroscopy    left shoulder surgery  11/24/2023   TOTAL HIP ARTHROPLASTY Right 03/11/2017   Procedure: RIGHT TOTAL HIP ARTHROPLASTY ANTERIOR APPROACH;  Surgeon: Vernetta Lonni GRADE, MD;  Location: [facility];  Service: Orthopedics;  Laterality: Right;   Patient Active Problem List    Diagnosis Date Noted   BMI 40.0-44.9, adult (HCC) current 40.05 10/20/2023   Vitamin D  deficiency 10/20/2023   Other fatigue 10/20/2023   Lactose intolerance 10/20/2023   DOE (dyspnea on exertion) 10/20/2023   Nut allergy 10/20/2023   Herpes simplex type 1 infection 10/13/2022   Chronic interstitial cystitis 10/13/2022   Morbid obesity (HCC) 10/13/2022   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HCC) 05/20/2020   Status post revision of total hip 05/20/2020   Trochanteric bursitis, right hip 09/05/2019   Hyperlipidemia 08/31/2019   Diabetes mellitus (HCC) 08/31/2019   Arthritis 08/31/2019   Overactive bladder 08/31/2019   Hypertensive disorder 08/30/2018   Asthma 08/30/2018   Migraine 08/30/2018   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   Unilateral primary osteoarthritis, right hip 01/27/2017   IUD (intrauterine device) in place 01/13/2017    PCP: [name], MD  REFERRING PROVIDER: [name], MD  REFERRING DIAG: [phone] (ICD-10-CM) - Chronic left shoulder pain   THERAPY DIAG:  Chronic left shoulder pain  Stiffness of left shoulder, not elsewhere classified  Muscle weakness (generalized)  Rationale for Evaluation and Treatment: Rehabilitation  ONSET DATE:   11/24/2023 surgery  SUBJECTIVE:                                                                                                                                                                                      SUBJECTIVE STATEMENT: Patient reports shoulder pain increased after trying to pull shirt over her head.   Hand dominance: Right  PERTINENT HISTORY: L shoulder injection 06/15/23  PAIN:   NPRS scale: 3/10  Pain location: left shoulder anterior Pain description: stinging, burning Aggravating factors: motion Relieving factors: ice  PRECAUTIONS: Lt Shoulder scope 11/24/23 AROM prn- follow biceps tenodesis protocol  RED FLAGS: None   WEIGHT BEARING RESTRICTIONS:  No  FALLS:  Has patient fallen in last 6 months? Unknown  LIVING ENVIRONMENT: Lives with: lives with their family Lives in: House/apartment Has following equipment at home: sling  OCCUPATION: Not working  PLOF: Independent  PATIENT GOALS: decrease pain  NEXT MD VISIT: 12/08/23  OBJECTIVE:  Note: Objective measures were completed at Evaluation unless otherwise noted.  DIAGNOSTIC FINDINGS:  MRI L Shoulder 05/19/23 IMPRESSION: 1. Mild supraspinatus tendinosis without a tear.  PATIENT SURVEYS :  PSFS: THE PATIENT SPECIFIC FUNCTIONAL SCALE  Place score of 0-10 (0 = unable to perform activity and 10 = able to perform activity at the same level as before injury or problem)  Activity Date: 11/28/23 01/13/2024 01/26/24  Do hairstyle 0 2 4  2.Pick up heavy items 0 4 Gal milk 0  3.Sleep on arm  0 4 0  4. Lift arm up 0 3 4  Total Score 0 3.25 2    Total Score = Sum of activity scores/number of activities  Minimally Detectable Change: 3 points (for single activity); 2 points (for average score)  [facility] (nd). The Patient Specific Functional Scale . Retrieved from [employer].com.pt   COGNITION: Eval   Overall cognitive status: Within functional limits for tasks assessed     SENSATION: Eval: WFL  POSTURE: Eval L shoulder higher, rounded shoulders moderate  UPPER EXTREMITY ROM:    AROM/PROM Left eval Left 9/172025 Supine  12/08/2023 12/15/23  01/13/2024 AROM in supine 01/26/24 AROM in supine  Shoulder flexion 22/90 115 PROM 124deg PROM 132 AA supine 130 138  Shoulder extension 40/       Shoulder abduction 25/45 90 PROM 90deg PROM (painful)   98 then P  Shoulder adduction        Shoulder internal rotation Crest/30 at 45    70 AROM in 45 deg abduction Sacrum  Shoulder external rotation 15/20 30 PROM in 30 deg abduction 20deg PROM in 30deg abduction   65 AROM in 45 deg abduction 35 in 45 deg  abduction  Elbow flexion        Elbow extension        Wrist flexion        Wrist extension        Wrist ulnar deviation        Wrist radial deviation        Wrist pronation        Wrist supination        (Blank rows = not tested)  UPPER EXTREMITY MMT:  MMT Left eval LEFT  01/26/24  Shoulder flexion 2+ 3-  Shoulder extension 3 3+  Shoulder abduction 2+ 3-  Shoulder adduction    Shoulder internal rotation 3 3+  Shoulder external rotation 3 3  Middle trapezius    Lower trapezius    Elbow flexion    Elbow extension    Wrist flexion    Wrist extension    Wrist ulnar deviation    Wrist radial deviation    Wrist pronation    Wrist supination    Grip strength (lbs)    (Blank rows = not tested)  SHOULDER SPECIAL TESTS: Eval No special tests due to post op status  JOINT MOBILITY TESTING:  Eval Decreased throughout   PALPATION:  Eval 2+ tenderness at incisions and mid deltoid.                     TREATMENT        DATE:   01/31/24 TherAct:  UBE 2/2 Wall ladder into shoulder flexion x10, shoulder abduction x5 Supine chest press/flexion with 2# bar 2x10 Neuro Re-Ed:  IR walkouts with red TB 10x with 2-3s hold  ER walkouts with red TB 10x with 2-3s hold  TB rows green  3x10 Scaption 1# Manual : scap mobs, PROM  01/26/24 TherAct:  UBE 2/2 Wall ladder into shoulder flexion x10, shoulder abduction x5 Supine AAROM abduction with PT assist  Neuro Re-Ed:  IR walkouts with red TB 10x with 2-3s hold  ER walkouts with red TB 10x with 2-3s hold  TB rows green  2x10   01/20/2024 TherAct:  Pulleys 3 min into flexion, 3 min into scaption (limited)  Highly fatiguing  Wall ladder into shoulder flexion x10, shoulder abduction x10 Supine AAROM abduction with PT assist 1x5 with 10s hold in max stretch, 1x3 with 10s hold in max stretch  Neuro Re-Ed:  IR walkouts with red TB 2x5 with 2-3s hold  ER walkouts with red TB 2x5 with 2-3s hold    TREATMENT        DATE:   01/18/2024 TherAct:  Pulleys 3 min into flexion, 3 min into scaption (limited)  Wall ladder into shoulder flexion x10, shoulder abduction x10 Supine shoulder flexion AAROM with 1# bar 2x10 with 2s hold  Supine shoulder ER AAROM with 1# bar 2x10 with 3s hold in max stretch (highly limited ROM)  Neuro Re-Ed:  Seated scapular retraction 2x10 with 5s hold  Supine scapular protraction with 1# bar , 2x10  Seated isometrics into ER, IR 1x6 each direction with 5s hold    TREATMENT        DATE:  01/13/2024 Therex: UBE fwd/back 3 mins each way lvl 2.5 for ROM with 1 min rest between directions.  HEP review with additions of wand based exercises at home discussed today.   Neuro Re-ed (muscle activation, control, coordination, scapular control) Supine AAROM scapular protraction press 3 sec hold x 10 1 lb bar  Seated scapular retraction 5 sec  hold 2 x 10  Seated Lt arm at side isometric painfree press 5 sec hold x 6 for ER, IR.    TherActivity (to improve reach, elevation) UE ranger flexion AAROM into flexion with ipsilateral leg step in 2 x 15  Supine AAROM 1 lb bar flexion 2 x 10     TREATMENT        DATE: 01/05/24 TherEx:  UBE level 1 with bilat UE, 2 min fwd/2 min back Wall ladder into shoulder flexion 1x10 (up to 23) Reach to cabinet shelves (first and second) with AAROM up and AROM down, 1x5 with rest break in between  UE ranger into scaption/abduction 1x5 with PT assist for last 3 reps  Supine shoulder flexion AAROM with 1# bar 2x8 Supine shoulder abduction AAROM with PT assist, 2x5 with 10s hold (able to reach ~90deg)       PATIENT EDUCATION: Education details: HEP Person educated: Patient Education method: Explanation, Demonstration, Tactile cues, and Verbal cues Education comprehension: verbalized understanding  HOME EXERCISE PROGRAM: Access Code: [email or web address] URL: [email or web address] Date: 11/28/2023 Prepared by: Burnard Meth  Exercises - Seated  Scapular Retraction  - 2 x daily - 7 x weekly - 2 sets - 10 reps - Seated Shoulder Shrugs  - 2 x daily - 7 x weekly - 2 sets - 10 reps - Supine Shoulder Flexion Extension AAROM with Dowel  - 2 x daily - 7 x weekly - 2 sets - 10 reps    ASSESSMENT:  CLINICAL IMPRESSION:  Patient needed VC for shoulder hike with scaption exercises.  Improved form if lift height reduced.  OBJECTIVE IMPAIRMENTS: decreased ROM, decreased strength, hypomobility, impaired UE functional use, and pain.   ACTIVITY LIMITATIONS: carrying, lifting, sleeping, bathing, dressing, self feeding, and reach over head  PARTICIPATION LIMITATIONS: meal prep, cleaning, laundry, driving, and community activity  PERSONAL FACTORS: None- are also affecting patient's functional outcome.   REHAB POTENTIAL: Good  CLINICAL DECISION MAKING: Stable/uncomplicated  EVALUATION COMPLEXITY: Moderate  GOALS: Goals reviewed with patient? Yes  SHORT TERM GOALS: Target date: 12/19/2023  MET    Pt to be independent with HEP. Baseline: Goal status: MET 12/15/23  2.  Decreased pain by 1 level. Baseline:  Goal status: MET 01/26/24  LONG TERM GOALS: Target date: 03/22/2024 8 more weeks      Patient to be independent in self progressive HEP by discharge. Baseline:  Goal status: Ongoing  01/26/24  2.  Increase active range of motion to WNL throughout. Baseline:  Goal status: Ongoing  01/26/24  3.  Increase strength to at least 4/5 throughout shoulder. Baseline: 2+ > 3 Goal status: Ongoing  01/26/24  4.  Reduce pain with all activities to 2 out of 10 or less. Baseline:  Goal status: Ongoing  01/26/24  5.  Improve PSFS score by at least 2 full points for a measurable improvement. Baseline: 0 Goal status: Ongoing  01/26/24  PLAN: PT FREQUENCY: 2x/week  (16 visits + previous auth 17)  PT DURATION: 8 weeks  PLANNED INTERVENTIONS: 97164- PT Re-evaluation, 97110-Therapeutic exercises, 97530- Therapeutic activity, [id number]-  Neuromuscular re-education, 97535- Self Care, 02859- Manual therapy, [id number]- Aquatic Therapy, H9716- Electrical stimulation (unattended), 97016- Vasopneumatic device, [id number]- Ultrasound, [id number]- Traction (mechanical), [id number]- Ionotophoresis 4mg /ml Dexamethasone , Patient/Family education, Joint mobilization, Scar mobilization, Cryotherapy, and Moist heat  PLAN FOR NEXT SESSION:   Continue with progression of ROM and strength.    Burnard Meth, PT 01/31/24  9:23 AM

## 2024-01-31 ENCOUNTER — Ambulatory Visit (INDEPENDENT_AMBULATORY_CARE_PROVIDER_SITE_OTHER)

## 2024-01-31 DIAGNOSIS — M6281 Muscle weakness (generalized): Secondary | ICD-10-CM

## 2024-01-31 DIAGNOSIS — M25512 Pain in left shoulder: Secondary | ICD-10-CM

## 2024-01-31 DIAGNOSIS — M25612 Stiffness of left shoulder, not elsewhere classified: Secondary | ICD-10-CM

## 2024-01-31 DIAGNOSIS — G8929 Other chronic pain: Secondary | ICD-10-CM

## 2024-02-01 NOTE — Therapy (Signed)
 OUTPATIENT PHYSICAL THERAPY TREATMENT   Patient Name: Emily Hester MRN: [id number] DOB:May 04, 1970, 53 y.o., female Today's Date: 02/02/2024  END OF SESSION:  PT End of Session - 02/02/24 0952     Visit Number 15    Number of Visits 33    Date for Recertification  03/22/24    Authorization Type [employer]    PT Start Time 0935    PT Stop Time 1013    PT Time Calculation (min) 38 min    Activity Tolerance Patient tolerated treatment well    Behavior During Therapy WFL for tasks assessed/performed                  Past Medical History:  Diagnosis Date   Arthritis    patient denies   Asthma    Back pain    Chest pain    Constipation    Diabetes mellitus without complication (HCC)    type    Food allergy    GERD (gastroesophageal reflux disease)    Heartburn    Hyperlipidemia    Hypertension    Interstitial cystitis    IUD (intrauterine device) in place placed 6 weeks ago   Joint pain    Lactose intolerance    Migraines    MIgraines- not current     OSA (obstructive sleep apnea)    Osteoarthritis    Palpitations    Pneumonia    SOB (shortness of breath)    Swallowing problem    Vitamin D  deficiency    Past Surgical History:  Procedure Laterality Date   ANTERIOR HIP REVISION Right 05/20/2020   Procedure: RIGHT TOTAL HIP REVISION- ploy exhange and hip ball exchange with bone graft;  Surgeon: Vernetta Lonni GRADE, MD;  Location: [facility];  Service: Orthopedics;  Laterality: Right;   DILATION AND EVACUATION  09/30/2011   Procedure: DILATATION AND EVACUATION;  Surgeon: Lynwood FORBES Curlene PONCE, MD;  Location: [facility];  Service: Gynecology;  Laterality: N/A;   left knee surgery      arthroscopy    left shoulder surgery  11/24/2023   TOTAL HIP ARTHROPLASTY Right 03/11/2017   Procedure: RIGHT TOTAL HIP ARTHROPLASTY ANTERIOR APPROACH;  Surgeon: Vernetta Lonni GRADE, MD;  Location: [facility];  Service: Orthopedics;  Laterality: Right;   Patient Active Problem  List   Diagnosis Date Noted   BMI 40.0-44.9, adult (HCC) current 40.05 10/20/2023   Vitamin D  deficiency 10/20/2023   Other fatigue 10/20/2023   Lactose intolerance 10/20/2023   DOE (dyspnea on exertion) 10/20/2023   Nut allergy 10/20/2023   Herpes simplex type 1 infection 10/13/2022   Chronic interstitial cystitis 10/13/2022   Morbid obesity (HCC) 10/13/2022   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HCC) 05/20/2020   Status post revision of total hip 05/20/2020   Trochanteric bursitis, right hip 09/05/2019   Hyperlipidemia 08/31/2019   Diabetes mellitus (HCC) 08/31/2019   Arthritis 08/31/2019   Overactive bladder 08/31/2019   Hypertensive disorder 08/30/2018   Asthma 08/30/2018   Migraine 08/30/2018   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   Unilateral primary osteoarthritis, right hip 01/27/2017   IUD (intrauterine device) in place 01/13/2017    PCP: [name], MD  REFERRING PROVIDER: Genelle Standing, MD  REFERRING DIAG: [phone] (ICD-10-CM) - Chronic left shoulder pain   THERAPY DIAG:  Chronic left shoulder pain  Stiffness of left shoulder, not elsewhere classified  Muscle weakness (generalized)  Rationale for Evaluation and Treatment: Rehabilitation  ONSET DATE:   11/24/2023 surgery  SUBJECTIVE:                                                                                                                                                                                      SUBJECTIVE STATEMENT: Patient reports shoulder feels stingy at the end of the day.  Hand dominance: Right  PERTINENT HISTORY: L shoulder injection 06/15/23  PAIN:   NPRS scale: 2/10  Pain location: left shoulder anterior Pain description: stinging, burning Aggravating factors: motion Relieving factors: ice  PRECAUTIONS: Lt Shoulder scope 11/24/23 AROM prn- follow biceps tenodesis protocol  RED FLAGS: None   WEIGHT BEARING RESTRICTIONS: No  FALLS:  Has  patient fallen in last 6 months? Unknown  LIVING ENVIRONMENT: Lives with: lives with their family Lives in: House/apartment Has following equipment at home: sling  OCCUPATION: Not working  PLOF: Independent  PATIENT GOALS: decrease pain  NEXT MD VISIT: 12/08/23  OBJECTIVE:  Note: Objective measures were completed at Evaluation unless otherwise noted.  DIAGNOSTIC FINDINGS:  MRI L Shoulder 05/19/23 IMPRESSION: 1. Mild supraspinatus tendinosis without a tear.  PATIENT SURVEYS :  PSFS: THE PATIENT SPECIFIC FUNCTIONAL SCALE  Place score of 0-10 (0 = unable to perform activity and 10 = able to perform activity at the same level as before injury or problem)  Activity Date: 11/28/23 01/13/2024 01/26/24  Do hairstyle 0 2 4  2.Pick up heavy items 0 4 Gal milk 0  3.Sleep on arm  0 4 0  4. Lift arm up 0 3 4  Total Score 0 3.25 2    Total Score = Sum of activity scores/number of activities  Minimally Detectable Change: 3 points (for single activity); 2 points (for average score)  [facility] (nd). The Patient Specific Functional Scale . Retrieved from [employer]ateo[employer].com.[email or web address]   COGNITION: Eval   Overall cognitive status: Within functional limits for tasks assessed     SENSATION: Eval: WFL  POSTURE: Eval L shoulder higher, rounded shoulders moderate  UPPER EXTREMITY ROM:    AROM/PROM Left eval Left 9/172025 Supine  12/08/2023 12/15/23  01/13/2024 AROM in supine 01/26/24 AROM in supine  Shoulder flexion 22/90 115 PROM 124deg PROM 132 AA supine 130 138  Shoulder extension 40/       Shoulder abduction 25/45 90 PROM 90deg PROM (painful)   98 then P  Shoulder adduction        Shoulder internal rotation Crest/30 at 45    70 AROM in 45 deg abduction Sacrum  Shoulder external rotation 15/20 30 PROM in 30 deg abduction 20deg PROM in 30deg abduction   65 AROM in 45 deg abduction 35 in 45 deg abduction  Elbow flexion  Elbow extension        Wrist flexion        Wrist extension        Wrist ulnar deviation        Wrist radial deviation        Wrist pronation        Wrist supination        (Blank rows = not tested)  UPPER EXTREMITY MMT:  MMT Left eval LEFT  01/26/24  Shoulder flexion 2+ 3-  Shoulder extension 3 3+  Shoulder abduction 2+ 3-  Shoulder adduction    Shoulder internal rotation 3 3+  Shoulder external rotation 3 3  Middle trapezius    Lower trapezius    Elbow flexion    Elbow extension    Wrist flexion    Wrist extension    Wrist ulnar deviation    Wrist radial deviation    Wrist pronation    Wrist supination    Grip strength (lbs)    (Blank rows = not tested)  SHOULDER SPECIAL TESTS: Eval No special tests due to post op status  JOINT MOBILITY TESTING:  Eval Decreased throughout   PALPATION:  Eval 2+ tenderness at incisions and mid deltoid.                     TREATMENT  L Shoulder      DATE:   02/02/24 TherAct:  UBE 2/2 Wall ladder into shoulder flexion x10, shoulder abduction x5 Supine chest press/flexion with 2# bar 2x10 Neuro Re-Ed:  IR walkouts with red TB 10x with 2-3s hold  ER walkouts with red TB 10x with 2-3s hold  TB rows green  3x10 Scaption 1# Manual : scap mobs, PROM    01/31/24 TherAct:  UBE 2/2 Wall ladder into shoulder flexion x10, shoulder abduction x5 Supine chest press/flexion with 2# bar 2x10 Neuro Re-Ed:  IR walkouts with red TB 10x with 2-3s hold  ER walkouts with red TB 10x with 2-3s hold  TB rows green  3x10 Scaption 1# Manual : scap mobs, PROM  01/26/24 TherAct:  UBE 2/2 Wall ladder into shoulder flexion x10, shoulder abduction x5 Supine AAROM abduction with PT assist  Neuro Re-Ed:  IR walkouts with red TB 10x with 2-3s hold  ER walkouts with red TB 10x with 2-3s hold  TB rows green  2x10   01/20/2024 TherAct:  Pulleys 3 min into flexion, 3 min into scaption (limited)  Highly fatiguing  Wall ladder into  shoulder flexion x10, shoulder abduction x10 Supine AAROM abduction with PT assist 1x5 with 10s hold in max stretch, 1x3 with 10s hold in max stretch  Neuro Re-Ed:  IR walkouts with red TB 2x5 with 2-3s hold  ER walkouts with red TB 2x5 with 2-3s hold    TREATMENT        DATE:  01/18/2024 TherAct:  Pulleys 3 min into flexion, 3 min into scaption (limited)  Wall ladder into shoulder flexion x10, shoulder abduction x10 Supine shoulder flexion AAROM with 1# bar 2x10 with 2s hold  Supine shoulder ER AAROM with 1# bar 2x10 with 3s hold in max stretch (highly limited ROM)  Neuro Re-Ed:  Seated scapular retraction 2x10 with 5s hold  Supine scapular protraction with 1# bar , 2x10  Seated isometrics into ER, IR 1x6 each direction with 5s hold    TREATMENT        DATE:  01/13/2024 Therex: UBE fwd/back 3 mins each way lvl 2.5 for  ROM with 1 min rest between directions.  HEP review with additions of wand based exercises at home discussed today.   Neuro Re-ed (muscle activation, control, coordination, scapular control) Supine AAROM scapular protraction press 3 sec hold x 10 1 lb bar  Seated scapular retraction 5 sec hold 2 x 10  Seated Lt arm at side isometric painfree press 5 sec hold x 6 for ER, IR.    TherActivity (to improve reach, elevation) UE ranger flexion AAROM into flexion with ipsilateral leg step in 2 x 15  Supine AAROM 1 lb bar flexion 2 x 10     TREATMENT        DATE: 01/05/24 TherEx:  UBE level 1 with bilat UE, 2 min fwd/2 min back Wall ladder into shoulder flexion 1x10 (up to 23) Reach to cabinet shelves (first and second) with AAROM up and AROM down, 1x5 with rest break in between  UE ranger into scaption/abduction 1x5 with PT assist for last 3 reps  Supine shoulder flexion AAROM with 1# bar 2x8 Supine shoulder abduction AAROM with PT assist, 2x5 with 10s hold (able to reach ~90deg)       PATIENT EDUCATION: Education details: HEP Person educated:  Patient Education method: Explanation, Demonstration, Tactile cues, and Verbal cues Education comprehension: verbalized understanding  HOME EXERCISE PROGRAM: Access Code: [email or web address] URL: [email or web address] Date: 11/28/2023 Prepared by: Burnard Meth  Exercises - Seated Scapular Retraction  - 2 x daily - 7 x weekly - 2 sets - 10 reps - Seated Shoulder Shrugs  - 2 x daily - 7 x weekly - 2 sets - 10 reps - Supine Shoulder Flexion Extension AAROM with Dowel  - 2 x daily - 7 x weekly - 2 sets - 10 reps    ASSESSMENT:  CLINICAL IMPRESSION:  Patient needed VC for posture with TB exercises.  Demo understanding.  OBJECTIVE IMPAIRMENTS: decreased ROM, decreased strength, hypomobility, impaired UE functional use, and pain.   ACTIVITY LIMITATIONS: carrying, lifting, sleeping, bathing, dressing, self feeding, and reach over head  PARTICIPATION LIMITATIONS: meal prep, cleaning, laundry, driving, and community activity  PERSONAL FACTORS: None- are also affecting patient's functional outcome.   REHAB POTENTIAL: Good  CLINICAL DECISION MAKING: Stable/uncomplicated  EVALUATION COMPLEXITY: Moderate  GOALS: Goals reviewed with patient? Yes  SHORT TERM GOALS: Target date: 12/19/2023  MET    Pt to be independent with HEP. Baseline: Goal status: MET 12/15/23  2.  Decreased pain by 1 level. Baseline:  Goal status: MET 01/26/24  LONG TERM GOALS: Target date: 03/22/2024 8 more weeks      Patient to be independent in self progressive HEP by discharge. Baseline:  Goal status: Ongoing  01/26/24  2.  Increase active range of motion to WNL throughout. Baseline:  Goal status: Ongoing  01/26/24  3.  Increase strength to at least 4/5 throughout shoulder. Baseline: 2+ > 3 Goal status: Ongoing  01/26/24  4.  Reduce pain with all activities to 2 out of 10 or less. Baseline:  Goal status: Ongoing  01/26/24  5.  Improve PSFS score by at least 2 full points for a measurable  improvement. Baseline: 0 Goal status: Ongoing  01/26/24  PLAN: PT FREQUENCY: 2x/week  (16 visits + previous auth 17)  PT DURATION: 8 weeks  PLANNED INTERVENTIONS: 97164- PT Re-evaluation, 97110-Therapeutic exercises, 97530- Therapeutic activity, [id number]- Neuromuscular re-education, 97535- Self Care, 02859- Manual therapy, [id number]- Aquatic Therapy, H9716- Electrical stimulation (unattended), 97016- Vasopneumatic device, [id number]- Ultrasound, [id number]- Traction (mechanical), [id number]- Ionotophoresis 4mg /ml Dexamethasone , Patient/Family  education, Joint mobilization, Scar mobilization, Cryotherapy, and Moist heat  PLAN FOR NEXT SESSION:   Continue with progression of ROM and strength.    Burnard Meth, PT 02/02/24  9:54 AM

## 2024-02-02 ENCOUNTER — Ambulatory Visit (INDEPENDENT_AMBULATORY_CARE_PROVIDER_SITE_OTHER)

## 2024-02-02 DIAGNOSIS — M25512 Pain in left shoulder: Secondary | ICD-10-CM

## 2024-02-02 DIAGNOSIS — M6281 Muscle weakness (generalized): Secondary | ICD-10-CM

## 2024-02-02 DIAGNOSIS — M25612 Stiffness of left shoulder, not elsewhere classified: Secondary | ICD-10-CM | POA: Diagnosis not present

## 2024-02-02 DIAGNOSIS — G8929 Other chronic pain: Secondary | ICD-10-CM | POA: Diagnosis not present

## 2024-02-08 ENCOUNTER — Other Ambulatory Visit: Payer: Self-pay | Admitting: Orthopaedic Surgery

## 2024-02-13 ENCOUNTER — Ambulatory Visit (INDEPENDENT_AMBULATORY_CARE_PROVIDER_SITE_OTHER): Payer: Self-pay | Admitting: Nurse Practitioner

## 2024-02-13 ENCOUNTER — Encounter

## 2024-02-13 NOTE — Therapy (Signed)
 OUTPATIENT PHYSICAL THERAPY TREATMENT   Patient Name: OPLE GIRGIS MRN: [id number] DOB:07/20/1970, 53 y.o., female Today's Date: 02/14/2024  END OF SESSION:  PT End of Session - 02/14/24 0946     Visit Number 16    Number of Visits 33    Date for Recertification  03/22/24    Authorization Type [employer]    PT Start Time 0930    PT Stop Time 1008    PT Time Calculation (min) 38 min    Activity Tolerance Patient tolerated treatment well    Behavior During Therapy WFL for tasks assessed/performed          Past Medical History:  Diagnosis Date   Arthritis    patient denies   Asthma    Back pain    Chest pain    Constipation    Diabetes mellitus without complication (HCC)    type    Food allergy    GERD (gastroesophageal reflux disease)    Heartburn    Hyperlipidemia    Hypertension    Interstitial cystitis    IUD (intrauterine device) in place placed 6 weeks ago   Joint pain    Lactose intolerance    Migraines    MIgraines- not current     OSA (obstructive sleep apnea)    Osteoarthritis    Palpitations    Pneumonia    SOB (shortness of breath)    Swallowing problem    Vitamin D  deficiency    Past Surgical History:  Procedure Laterality Date   ANTERIOR HIP REVISION Right 05/20/2020   Procedure: RIGHT TOTAL HIP REVISION- ploy exhange and hip ball exchange with bone graft;  Surgeon: Vernetta Lonni GRADE, MD;  Location: [facility];  Service: Orthopedics;  Laterality: Right;   DILATION AND EVACUATION  09/30/2011   Procedure: DILATATION AND EVACUATION;  Surgeon: Lynwood FORBES Curlene PONCE, MD;  Location: [facility];  Service: Gynecology;  Laterality: N/A;   left knee surgery      arthroscopy    left shoulder surgery  11/24/2023   TOTAL HIP ARTHROPLASTY Right 03/11/2017   Procedure: RIGHT TOTAL HIP ARTHROPLASTY ANTERIOR APPROACH;  Surgeon: Vernetta Lonni GRADE, MD;  Location: [facility];  Service: Orthopedics;  Laterality: Right;   Patient Active Problem List   Diagnosis  Date Noted   BMI 40.0-44.9, adult (HCC) current 40.05 10/20/2023   Vitamin D  deficiency 10/20/2023   Other fatigue 10/20/2023   Lactose intolerance 10/20/2023   DOE (dyspnea on exertion) 10/20/2023   Nut allergy 10/20/2023   Herpes simplex type 1 infection 10/13/2022   Chronic interstitial cystitis 10/13/2022   Morbid obesity (HCC) 10/13/2022   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HCC) 05/20/2020   Status post revision of total hip 05/20/2020   Trochanteric bursitis, right hip 09/05/2019   Hyperlipidemia 08/31/2019   Diabetes mellitus (HCC) 08/31/2019   Arthritis 08/31/2019   Overactive bladder 08/31/2019   Hypertensive disorder 08/30/2018   Asthma 08/30/2018   Migraine 08/30/2018   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   Unilateral primary osteoarthritis, right hip 01/27/2017   IUD (intrauterine device) in place 01/13/2017    PCP: [name], MD  REFERRING PROVIDER: Gretta Bertrum ORN, PA-C  REFERRING DIAG: [phone] (ICD-10-CM) - Chronic left shoulder pain   THERAPY DIAG:  Chronic left shoulder pain  Stiffness of left shoulder, not elsewhere classified  Muscle weakness (generalized)  Acute pain of left shoulder  Rationale for Evaluation and Treatment: Rehabilitation  ONSET DATE:   11/24/2023 surgery  SUBJECTIVE:                                                                                                                                                                                      SUBJECTIVE STATEMENT: Patient reports shoulder feels stronger everyday. No need for pain meds lately.  Hand dominance: Right  PERTINENT HISTORY: L shoulder injection 06/15/23  PAIN:   NPRS scale: 2/10  Pain location: left shoulder anterior Pain description: stinging, burning Aggravating factors: motion Relieving factors: ice  PRECAUTIONS: Lt Shoulder scope 11/24/23 AROM prn- follow biceps tenodesis protocol  RED FLAGS: None   WEIGHT BEARING  RESTRICTIONS: No  FALLS:  Has patient fallen in last 6 months? Unknown  LIVING ENVIRONMENT: Lives with: lives with their family Lives in: House/apartment Has following equipment at home: sling  OCCUPATION: Not working  PLOF: Independent  PATIENT GOALS: decrease pain  NEXT MD VISIT: 12/08/23  OBJECTIVE:  Note: Objective measures were completed at Evaluation unless otherwise noted.  DIAGNOSTIC FINDINGS:  MRI L Shoulder 05/19/23 IMPRESSION: 1. Mild supraspinatus tendinosis without a tear.  PATIENT SURVEYS :  PSFS: THE PATIENT SPECIFIC FUNCTIONAL SCALE  Place score of 0-10 (0 = unable to perform activity and 10 = able to perform activity at the same level as before injury or problem)  Activity Date: 11/28/23 01/13/2024 01/26/24  Do hairstyle 0 2 4  2.Pick up heavy items 0 4 Gal milk 0  3.Sleep on arm  0 4 0  4. Lift arm up 0 3 4  Total Score 0 3.25 2    Total Score = Sum of activity scores/number of activities  Minimally Detectable Change: 3 points (for single activity); 2 points (for average score)  [facility] (nd). The Patient Specific Functional Scale . Retrieved from [employer]com.pt   COGNITION: Eval   Overall cognitive status: Within functional limits for tasks assessed     SENSATION: Eval: WFL  POSTURE: Eval L shoulder higher, rounded shoulders moderate  UPPER EXTREMITY ROM:    AROM/PROM Left eval Left 9/172025 Supine  12/08/2023 12/15/23  01/13/2024 AROM in supine 01/26/24 AROM in supine  Shoulder flexion 22/90 115 PROM 124deg PROM 132 AA supine 130 138  Shoulder extension 40/       Shoulder abduction 25/45 90 PROM 90deg PROM (painful)   98 then P  Shoulder adduction        Shoulder internal rotation Crest/30 at 45    70 AROM in 45 deg abduction Sacrum  Shoulder external rotation 15/20 30 PROM in 30 deg abduction 20deg PROM in 30deg abduction   65 AROM in 45 deg abduction 35 in  45 deg abduction  Elbow flexion        Elbow extension        Wrist flexion        Wrist extension        Wrist ulnar deviation        Wrist radial deviation        Wrist pronation        Wrist supination        (Blank rows = not tested)  UPPER EXTREMITY MMT:  MMT Left eval LEFT  01/26/24  Shoulder flexion 2+ 3-  Shoulder extension 3 3+  Shoulder abduction 2+ 3-  Shoulder adduction    Shoulder internal rotation 3 3+  Shoulder external rotation 3 3  Middle trapezius    Lower trapezius    Elbow flexion    Elbow extension    Wrist flexion    Wrist extension    Wrist ulnar deviation    Wrist radial deviation    Wrist pronation    Wrist supination    Grip strength (lbs)    (Blank rows = not tested)  SHOULDER SPECIAL TESTS: Eval No special tests due to post op status  JOINT MOBILITY TESTING:  Eval Decreased throughout   PALPATION:  Eval 2+ tenderness at incisions and mid deltoid.                     TREATMENT  L Shoulder      DATE:   02/14/24 TherAct:  UBE 2 min each way no resistance Wall ladder into shoulder flexion x10, shoulder abduction x5 Supine chest press/flexion with 2# bar 2x10 Scaption 1#  2x10  Neuro Re-Ed:  IR walkouts with green TB 10x2 with 2-3s hold  ER walkouts with red TB 10x 2with 2-3s hold  TB rows green  3x10 Ball on wall 20x CW/CCW     02/02/24 TherAct:  UBE 2/2 Wall ladder into shoulder flexion x10, shoulder abduction x5 Supine chest press/flexion with 2# bar 2x10 Neuro Re-Ed:  IR walkouts with red TB 10x with 2-3s hold  ER walkouts with red TB 10x with 2-3s hold  TB rows green  3x10 Scaption 1# Manual : scap mobs, PROM    01/31/24 TherAct:  UBE 2/2 Wall ladder into shoulder flexion x10, shoulder abduction x5 Supine chest press/flexion with 2# bar 2x10 Neuro Re-Ed:  IR walkouts with red TB 10x with 2-3s hold  ER walkouts with red TB 10x with 2-3s hold  TB rows green  3x10 Scaption 1# Manual : scap mobs,  PROM  01/26/24 TherAct:  UBE 2/2 Wall ladder into shoulder flexion x10, shoulder abduction x5 Supine AAROM abduction with PT assist  Neuro Re-Ed:  IR walkouts with red TB 10x with 2-3s hold  ER walkouts with red TB 10x with 2-3s hold  TB rows green  2x10    PATIENT EDUCATION: Education details: HEP Person educated: Patient Education method: Programmer, Multimedia, Demonstration, Tactile cues, and Verbal cues Education comprehension: verbalized understanding  HOME EXERCISE PROGRAM: Access Code: [email or web address] URL: [email or web address] Date: 11/28/2023 Prepared by: Burnard Meth  Exercises - Seated Scapular Retraction  - 2 x daily - 7 x weekly - 2 sets - 10 reps - Seated Shoulder Shrugs  - 2 x daily - 7 x weekly - 2 sets - 10 reps - Supine Shoulder Flexion Extension AAROM with Dowel  - 2 x daily - 7 x weekly - 2 sets - 10 reps    ASSESSMENT:  CLINICAL IMPRESSION:  Patient demonstrates increased strength with  ability to increase tension with TB.  OBJECTIVE IMPAIRMENTS: decreased ROM, decreased strength, hypomobility, impaired UE functional use, and pain.   ACTIVITY LIMITATIONS: carrying, lifting, sleeping, bathing, dressing, self feeding, and reach over head  PARTICIPATION LIMITATIONS: meal prep, cleaning, laundry, driving, and community activity  PERSONAL FACTORS: None- are also affecting patient's functional outcome.   REHAB POTENTIAL: Good  CLINICAL DECISION MAKING: Stable/uncomplicated  EVALUATION COMPLEXITY: Moderate  GOALS: Goals reviewed with patient? Yes  SHORT TERM GOALS: Target date: 12/19/2023  MET    Pt to be independent with HEP. Baseline: Goal status: MET 12/15/23  2.  Decreased pain by 1 level. Baseline:  Goal status: MET 01/26/24  LONG TERM GOALS: Target date: 03/22/2024 8 more weeks      Patient to be independent in self progressive HEP by discharge. Baseline:  Goal status: Ongoing  01/26/24  2.  Increase active range of motion to WNL  throughout. Baseline:  Goal status: Ongoing  01/26/24  3.  Increase strength to at least 4/5 throughout shoulder. Baseline: 2+ > 3 Goal status: Ongoing  01/26/24  4.  Reduce pain with all activities to 2 out of 10 or less. Baseline:  Goal status: Ongoing  01/26/24  5.  Improve PSFS score by at least 2 full points for a measurable improvement. Baseline: 0 Goal status: Ongoing  01/26/24  PLAN: PT FREQUENCY: 2x/week  (16 visits + previous auth 17)  PT DURATION: 8 weeks  PLANNED INTERVENTIONS: 97164- PT Re-evaluation, 97110-Therapeutic exercises, 97530- Therapeutic activity, [id number]- Neuromuscular re-education, 97535- Self Care, 02859- Manual therapy, [id number]- Aquatic Therapy, H9716- Electrical stimulation (unattended), 97016- Vasopneumatic device, [id number]- Ultrasound, [id number]- Traction (mechanical), [id number]- Ionotophoresis 4mg /ml Dexamethasone , Patient/Family education, Joint mobilization, Scar mobilization, Cryotherapy, and Moist heat  PLAN FOR NEXT SESSION:   Continue with progression of ROM and strength.    Burnard Meth, PT 02/14/24  9:48 AM

## 2024-02-14 ENCOUNTER — Ambulatory Visit

## 2024-02-14 DIAGNOSIS — G8929 Other chronic pain: Secondary | ICD-10-CM | POA: Diagnosis not present

## 2024-02-14 DIAGNOSIS — M6281 Muscle weakness (generalized): Secondary | ICD-10-CM | POA: Diagnosis not present

## 2024-02-14 DIAGNOSIS — M25512 Pain in left shoulder: Secondary | ICD-10-CM

## 2024-02-14 DIAGNOSIS — M25612 Stiffness of left shoulder, not elsewhere classified: Secondary | ICD-10-CM | POA: Diagnosis not present

## 2024-02-17 ENCOUNTER — Encounter

## 2024-02-20 NOTE — Therapy (Incomplete)
 OUTPATIENT PHYSICAL THERAPY TREATMENT   Patient Name: Emily Hester MRN: [id number] DOB:08/11/1970, 53 y.o., female Today's Date: 02/20/2024  END OF SESSION:    Past Medical History:  Diagnosis Date   Arthritis    patient denies   Asthma    Back pain    Chest pain    Constipation    Diabetes mellitus without complication (HCC)    type    Food allergy    GERD (gastroesophageal reflux disease)    Heartburn    Hyperlipidemia    Hypertension    Interstitial cystitis    IUD (intrauterine device) in place placed 6 weeks ago   Joint pain    Lactose intolerance    Migraines    MIgraines- not current     OSA (obstructive sleep apnea)    Osteoarthritis    Palpitations    Pneumonia    SOB (shortness of breath)    Swallowing problem    Vitamin D  deficiency    Past Surgical History:  Procedure Laterality Date   ANTERIOR HIP REVISION Right 05/20/2020   Procedure: RIGHT TOTAL HIP REVISION- ploy exhange and hip ball exchange with bone graft;  Surgeon: Vernetta Lonni GRADE, MD;  Location: [facility];  Service: Orthopedics;  Laterality: Right;   DILATION AND EVACUATION  09/30/2011   Procedure: DILATATION AND EVACUATION;  Surgeon: Lynwood FORBES Curlene PONCE, MD;  Location: [facility];  Service: Gynecology;  Laterality: N/A;   left knee surgery      arthroscopy    left shoulder surgery  11/24/2023   TOTAL HIP ARTHROPLASTY Right 03/11/2017   Procedure: RIGHT TOTAL HIP ARTHROPLASTY ANTERIOR APPROACH;  Surgeon: Vernetta Lonni GRADE, MD;  Location: [facility];  Service: Orthopedics;  Laterality: Right;   Patient Active Problem List   Diagnosis Date Noted   BMI 40.0-44.9, adult (HCC) current 40.05 10/20/2023   Vitamin D  deficiency 10/20/2023   Other fatigue 10/20/2023   Lactose intolerance 10/20/2023   DOE (dyspnea on exertion) 10/20/2023   Nut allergy 10/20/2023   Herpes simplex type 1 infection 10/13/2022   Chronic interstitial cystitis 10/13/2022   Morbid obesity (HCC) 10/13/2022    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HCC) 05/20/2020   Status post revision of total hip 05/20/2020   Trochanteric bursitis, right hip 09/05/2019   Hyperlipidemia 08/31/2019   Diabetes mellitus (HCC) 08/31/2019   Arthritis 08/31/2019   Overactive bladder 08/31/2019   Hypertensive disorder 08/30/2018   Asthma 08/30/2018   Migraine 08/30/2018   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   Unilateral primary osteoarthritis, right hip 01/27/2017   IUD (intrauterine device) in place 01/13/2017    PCP: [name], MD  REFERRING PROVIDER: [name], MD  REFERRING DIAG: [phone] (ICD-10-CM) - Chronic left shoulder pain   THERAPY DIAG:  No diagnosis found.  Rationale for Evaluation and Treatment: Rehabilitation  ONSET DATE:   11/24/2023 surgery   SUBJECTIVE:  SUBJECTIVE STATEMENT: ***Patient reports shoulder feels stronger everyday. No need for pain meds lately.  Hand dominance: Right  PERTINENT HISTORY: L shoulder injection 06/15/23  PAIN:   ***NPRS scale: 2/10  Pain location: left shoulder anterior Pain description: stinging, burning Aggravating factors: motion Relieving factors: ice  PRECAUTIONS: Lt Shoulder scope 11/24/23 AROM prn- follow biceps tenodesis protocol  RED FLAGS: None   WEIGHT BEARING RESTRICTIONS: No  FALLS:  Has patient fallen in last 6 months? Unknown  LIVING ENVIRONMENT: Lives with: lives with their family Lives in: House/apartment Has following equipment at home: sling  OCCUPATION: Not working  PLOF: Independent  PATIENT GOALS: decrease pain  NEXT MD VISIT: 12/08/23  OBJECTIVE:  Note: Objective measures were completed at Evaluation unless otherwise noted.  DIAGNOSTIC FINDINGS:  MRI L Shoulder 05/19/23 IMPRESSION: 1. Mild  supraspinatus tendinosis without a tear.  PATIENT SURVEYS :  PSFS: THE PATIENT SPECIFIC FUNCTIONAL SCALE  Place score of 0-10 (0 = unable to perform activity and 10 = able to perform activity at the same level as before injury or problem)  Activity Date: 11/28/23 01/13/2024 01/26/24  Do hairstyle 0 2 4  2.Pick up heavy items 0 4 Gal milk 0  3.Sleep on arm  0 4 0  4. Lift arm up 0 3 4  Total Score 0 3.25 2    Total Score = Sum of activity scores/number of activities  Minimally Detectable Change: 3 points (for single activity); 2 points (for average score)  [facility] (nd). The Patient Specific Functional Scale . Retrieved from [employer]ateo[employer].com.pt   COGNITION: Eval   Overall cognitive status: Within functional limits for tasks assessed     SENSATION: Eval: WFL  POSTURE: Eval L shoulder higher, rounded shoulders moderate  UPPER EXTREMITY ROM:    AROM/PROM Left eval Left 9/172025 Supine  12/08/2023 12/15/23  01/13/2024 AROM in supine 01/26/24 AROM in supine  Shoulder flexion 22/90 115 PROM 124deg PROM 132 AA supine 130 138  Shoulder extension 40/       Shoulder abduction 25/45 90 PROM 90deg PROM (painful)   98 then P  Shoulder adduction        Shoulder internal rotation Crest/30 at 45    70 AROM in 45 deg abduction Sacrum  Shoulder external rotation 15/20 30 PROM in 30 deg abduction 20deg PROM in 30deg abduction   65 AROM in 45 deg abduction 35 in 45 deg abduction  Elbow flexion        Elbow extension        Wrist flexion        Wrist extension        Wrist ulnar deviation        Wrist radial deviation        Wrist pronation        Wrist supination        (Blank rows = not tested)  UPPER EXTREMITY MMT:  MMT Left eval LEFT  01/26/24  Shoulder flexion 2+ 3-  Shoulder extension 3 3+  Shoulder abduction 2+ 3-  Shoulder adduction    Shoulder internal rotation 3 3+  Shoulder external rotation 3 3   Middle trapezius    Lower trapezius    Elbow flexion    Elbow extension    Wrist flexion    Wrist extension    Wrist ulnar deviation    Wrist radial deviation    Wrist pronation    Wrist supination    Grip strength (lbs)    (Blank  rows = not tested)  SHOULDER SPECIAL TESTS: Eval No special tests due to post op status  JOINT MOBILITY TESTING:  Eval Decreased throughout   PALPATION:  Eval 2+ tenderness at incisions and mid deltoid.                     TREATMENT  L Shoulder      DATE:   02/20/24***    02/14/24 TherAct:  UBE 2 min each way no resistance Wall ladder into shoulder flexion x10, shoulder abduction x5 Supine chest press/flexion with 2# bar 2x10 Scaption 1#  2x10  Neuro Re-Ed:  IR walkouts with green TB 10x2 with 2-3s hold  ER walkouts with red TB 10x 2with 2-3s hold  TB rows green  3x10 Ball on wall 20x CW/CCW     02/02/24 TherAct:  UBE 2/2 Wall ladder into shoulder flexion x10, shoulder abduction x5 Supine chest press/flexion with 2# bar 2x10 Neuro Re-Ed:  IR walkouts with red TB 10x with 2-3s hold  ER walkouts with red TB 10x with 2-3s hold  TB rows green  3x10 Scaption 1# Manual : scap mobs, PROM    01/31/24 TherAct:  UBE 2/2 Wall ladder into shoulder flexion x10, shoulder abduction x5 Supine chest press/flexion with 2# bar 2x10 Neuro Re-Ed:  IR walkouts with red TB 10x with 2-3s hold  ER walkouts with red TB 10x with 2-3s hold  TB rows green  3x10 Scaption 1# Manual : scap mobs, PROM  01/26/24 TherAct:  UBE 2/2 Wall ladder into shoulder flexion x10, shoulder abduction x5 Supine AAROM abduction with PT assist  Neuro Re-Ed:  IR walkouts with red TB 10x with 2-3s hold  ER walkouts with red TB 10x with 2-3s hold  TB rows green  2x10    PATIENT EDUCATION: Education details: HEP Person educated: Patient Education method: Programmer, Multimedia, Demonstration, Tactile cues, and Verbal cues Education comprehension: verbalized  understanding  HOME EXERCISE PROGRAM: Access Code: [email or web address] URL: [email or web address] Date: 11/28/2023 Prepared by: Burnard Meth  Exercises - Seated Scapular Retraction  - 2 x daily - 7 x weekly - 2 sets - 10 reps - Seated Shoulder Shrugs  - 2 x daily - 7 x weekly - 2 sets - 10 reps - Supine Shoulder Flexion Extension AAROM with Dowel  - 2 x daily - 7 x weekly - 2 sets - 10 reps    ASSESSMENT:  CLINICAL IMPRESSION:  ***Patient demonstrates increased strength with ability to increase tension with TB.  OBJECTIVE IMPAIRMENTS: decreased ROM, decreased strength, hypomobility, impaired UE functional use, and pain.   ACTIVITY LIMITATIONS: carrying, lifting, sleeping, bathing, dressing, self feeding, and reach over head  PARTICIPATION LIMITATIONS: meal prep, cleaning, laundry, driving, and community activity  PERSONAL FACTORS: None- are also affecting patient's functional outcome.   REHAB POTENTIAL: Good  CLINICAL DECISION MAKING: Stable/uncomplicated  EVALUATION COMPLEXITY: Moderate  GOALS: Goals reviewed with patient? Yes  SHORT TERM GOALS: Target date: 12/19/2023  MET    Pt to be independent with HEP. Baseline: Goal status: MET 12/15/23  2.  Decreased pain by 1 level. Baseline:  Goal status: MET 01/26/24  LONG TERM GOALS: Target date: 03/22/2024 8 more weeks      Patient to be independent in self progressive HEP by discharge. Baseline:  Goal status: Ongoing  01/26/24  2.  Increase active range of motion to WNL throughout. Baseline:  Goal status: Ongoing  01/26/24  3.  Increase strength to at least 4/5 throughout shoulder. Baseline: 2+ >  3 Goal status: Ongoing  01/26/24  4.  Reduce pain with all activities to 2 out of 10 or less. Baseline:  Goal status: Ongoing  01/26/24  5.  Improve PSFS score by at least 2 full points for a measurable improvement. Baseline: 0 Goal status: Ongoing  01/26/24  PLAN: PT FREQUENCY: 2x/week  (16 visits +  previous auth 17)  PT DURATION: 8 weeks  PLANNED INTERVENTIONS: 02835- PT Re-evaluation, 97110-Therapeutic exercises, 97530- Therapeutic activity, [id number]- Neuromuscular re-education, 97535- Self Care, 02859- Manual therapy, [id number]- Aquatic Therapy, H9716- Electrical stimulation (unattended), 97016- Vasopneumatic device, [id number]- Ultrasound, [id number]- Traction (mechanical), [id number]- Ionotophoresis 4mg /ml Dexamethasone , Patient/Family education, Joint mobilization, Scar mobilization, Cryotherapy, and Moist heat  PLAN FOR NEXT SESSION:   ***Continue with progression of ROM and strength.    Burnard Meth, PT 02/20/24  7:56 AM

## 2024-02-21 ENCOUNTER — Encounter

## 2024-02-21 ENCOUNTER — Encounter: Admitting: Rehabilitative and Restorative Service Providers"

## 2024-02-22 ENCOUNTER — Ambulatory Visit (INDEPENDENT_AMBULATORY_CARE_PROVIDER_SITE_OTHER): Admitting: Orthopaedic Surgery

## 2024-02-22 ENCOUNTER — Other Ambulatory Visit (HOSPITAL_BASED_OUTPATIENT_CLINIC_OR_DEPARTMENT_OTHER): Payer: Self-pay

## 2024-02-22 DIAGNOSIS — M25512 Pain in left shoulder: Secondary | ICD-10-CM

## 2024-02-22 DIAGNOSIS — G8929 Other chronic pain: Secondary | ICD-10-CM

## 2024-02-22 MED ORDER — TRAMADOL HCL 50 MG PO TABS
50.0000 mg | ORAL_TABLET | Freq: Four times a day (QID) | ORAL | 0 refills | Status: AC | PRN
  Filled 2024-02-22: qty 20, 5d supply, fill #0

## 2024-02-22 MED ORDER — ACETAMINOPHEN 500 MG PO TABS
500.0000 mg | ORAL_TABLET | Freq: Three times a day (TID) | ORAL | 0 refills | Status: AC
  Filled 2024-02-22: qty 30, 10d supply, fill #0

## 2024-02-22 NOTE — Therapy (Signed)
 OUTPATIENT PHYSICAL THERAPY TREATMENT   Patient Name: Emily Hester MRN: [id number] DOB:[birth year], 53 y.o., female Today's Date: 02/23/2024  END OF SESSION:  PT End of Session - 02/23/24 0959     Visit Number 17    Number of Visits 33    Date for Recertification  03/22/24    Authorization Type [employer]    PT Start Time 0932    PT Stop Time 1010    PT Time Calculation (min) 38 min    Activity Tolerance Patient tolerated treatment well    Behavior During Therapy WFL for tasks assessed/performed           Past Medical History:  Diagnosis Date   Arthritis    patient denies   Asthma    Back pain    Chest pain    Constipation    Diabetes mellitus without complication (HCC)    type    Food allergy    GERD (gastroesophageal reflux disease)    Heartburn    Hyperlipidemia    Hypertension    Interstitial cystitis    IUD (intrauterine device) in place placed 6 weeks ago   Joint pain    Lactose intolerance    Migraines    MIgraines- not current     OSA (obstructive sleep apnea)    Osteoarthritis    Palpitations    Pneumonia    SOB (shortness of breath)    Swallowing problem    Vitamin D  deficiency    Past Surgical History:  Procedure Laterality Date   ANTERIOR HIP REVISION Right 05/20/2020   Procedure: RIGHT TOTAL HIP REVISION- ploy exhange and hip ball exchange with bone graft;  Surgeon: Vernetta Lonni GRADE, MD;  Location: [facility];  Service: Orthopedics;  Laterality: Right;   DILATION AND EVACUATION  09/30/2011   Procedure: DILATATION AND EVACUATION;  Surgeon: Lynwood FORBES Curlene PONCE, MD;  Location: [facility];  Service: Gynecology;  Laterality: N/A;   left knee surgery      arthroscopy    left shoulder surgery  11/24/2023   TOTAL HIP ARTHROPLASTY Right 03/11/2017   Procedure: RIGHT TOTAL HIP ARTHROPLASTY ANTERIOR APPROACH;  Surgeon: Vernetta Lonni GRADE, MD;  Location: [facility];  Service: Orthopedics;  Laterality: Right;   Patient Active Problem List   Diagnosis  Date Noted   BMI 40.0-44.9, adult (HCC) current 40.05 10/20/2023   Vitamin D  deficiency 10/20/2023   Other fatigue 10/20/2023   Lactose intolerance 10/20/2023   DOE (dyspnea on exertion) 10/20/2023   Nut allergy 10/20/2023   Herpes simplex type 1 infection 10/13/2022   Chronic interstitial cystitis 10/13/2022   Morbid obesity (HCC) 10/13/2022   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HCC) 05/20/2020   Status post revision of total hip 05/20/2020   Trochanteric bursitis, right hip 09/05/2019   Hyperlipidemia 08/31/2019   Diabetes mellitus (HCC) 08/31/2019   Arthritis 08/31/2019   Overactive bladder 08/31/2019   Hypertensive disorder 08/30/2018   Asthma 08/30/2018   Migraine 08/30/2018   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   Unilateral primary osteoarthritis, right hip 01/27/2017   IUD (intrauterine device) in place 01/13/2017    PCP: [name], MD  REFERRING PROVIDER: Gretta Bertrum ORN, PA-C  REFERRING DIAG: [phone] (ICD-10-CM) - Chronic left shoulder pain   THERAPY DIAG:  Chronic left shoulder pain  Stiffness of left shoulder, not elsewhere classified  Muscle weakness (generalized)  Acute pain of left shoulder  Rationale for Evaluation and Treatment: Rehabilitation  ONSET DATE:   11/24/2023 surgery  SUBJECTIVE:                                                                                                                                                                                      SUBJECTIVE STATEMENT: Patient reports seeing surgeon yesterday and was released by him but is to continue with PT.  Fees some  stingy and burning occasionally.  Hand dominance: Right  PERTINENT HISTORY: L shoulder injection 06/15/23  PAIN:   NPRS scale: 3/10  Pain location: left shoulder anterior Pain description: stinging, burning Aggravating factors: motion Relieving factors: ice  PRECAUTIONS: Lt Shoulder scope 11/24/23 AROM prn- follow biceps  tenodesis protocol  RED FLAGS: None   WEIGHT BEARING RESTRICTIONS: No  FALLS:  Has patient fallen in last 6 months? Unknown  LIVING ENVIRONMENT: Lives with: lives with their family Lives in: House/apartment Has following equipment at home: sling  OCCUPATION: Not working  PLOF: Independent  PATIENT GOALS: decrease pain  NEXT MD VISIT: 12/08/23  OBJECTIVE:  Note: Objective measures were completed at Evaluation unless otherwise noted.  DIAGNOSTIC FINDINGS:  MRI L Shoulder 05/19/23 IMPRESSION: 1. Mild supraspinatus tendinosis without a tear.  PATIENT SURVEYS :  PSFS: THE PATIENT SPECIFIC FUNCTIONAL SCALE  Place score of 0-10 (0 = unable to perform activity and 10 = able to perform activity at the same level as before injury or problem)  Activity Date: 11/28/23 01/13/2024 01/26/24  Do hairstyle 0 2 4  2.Pick up heavy items 0 4 Gal milk 0  3.Sleep on arm  0 4 0  4. Lift arm up 0 3 4  Total Score 0 3.25 2    Total Score = Sum of activity scores/number of activities  Minimally Detectable Change: 3 points (for single activity); 2 points (for average score)  [facility] (nd). The Patient Specific Functional Scale . Retrieved from [employer].com.pt   COGNITION: Eval   Overall cognitive status: Within functional limits for tasks assessed     SENSATION: Eval: WFL  POSTURE: Eval L shoulder higher, rounded shoulders moderate  UPPER EXTREMITY ROM:    AROM/PROM Left eval Left 9/172025 Supine  12/08/2023 12/15/23  01/13/2024 AROM in supine 01/26/24 AROM in supine 02/23/24 AROM in supine  Shoulder flexion 22/90 115 PROM 124deg PROM 132 AA supine 130 138 142  Shoulder extension 40/        Shoulder abduction 25/45 90 PROM 90deg PROM (painful)   98 then P 125  Shoulder adduction         Shoulder internal rotation Crest/30 at 45    70 AROM in 45 deg abduction Sacrum Crest   Shoulder external rotation  15/20 30 PROM in  30 deg abduction 20deg PROM in 30deg abduction   65 AROM in 45 deg abduction 35 in 45 deg abduction 40  Elbow flexion         Elbow extension         Wrist flexion         Wrist extension         Wrist ulnar deviation         Wrist radial deviation         Wrist pronation         Wrist supination         (Blank rows = not tested)  UPPER EXTREMITY MMT:  MMT Left eval LEFT  01/26/24 02/23/24  Shoulder flexion 2+ 3- 3  Shoulder extension 3 3+ 3+  Shoulder abduction 2+ 3- 3  Shoulder adduction     Shoulder internal rotation 3 3+ 3+  Shoulder external rotation 3 3 3   Middle trapezius     Lower trapezius     Elbow flexion     Elbow extension     Wrist flexion     Wrist extension     Wrist ulnar deviation     Wrist radial deviation     Wrist pronation     Wrist supination     Grip strength (lbs)     (Blank rows = not tested)  SHOULDER SPECIAL TESTS: Eval No special tests due to post op status  JOINT MOBILITY TESTING:  Eval Decreased throughout   PALPATION:  Eval 2+ tenderness at incisions and mid deltoid.                     TREATMENT  L Shoulder      DATE:   02/23/24 TherAct:  UBE 3 min each way no resistance Wall ladder into shoulder flexion x10 Supine chest press/flexion with 2# bar 3x10 Scaption 1#  2x10  Neuro Re-Ed:  IR active TB 10x3 with 2-3s hold Red ER walkouts with red TB 10x 3 with 2-3s hold  TB rows green  3x10 Ball on wall 20x CW/CCW    02/14/24 TherAct:  UBE 2 min each way no resistance Wall ladder into shoulder flexion x10, shoulder abduction x5 Supine chest press/flexion with 2# bar 2x10 Scaption 1#  2x10  Neuro Re-Ed:  IR walkouts with green TB 10x2 with 2-3s hold  ER walkouts with red TB 10x 2with 2-3s hold  TB rows green  3x10 Ball on wall 20x CW/CCW     02/02/24 TherAct:  UBE 2/2 Wall ladder into shoulder flexion x10, shoulder abduction x5 Supine chest press/flexion with 2# bar 2x10 Neuro Re-Ed:  IR  walkouts with red TB 10x with 2-3s hold  ER walkouts with red TB 10x with 2-3s hold  TB rows green  3x10 Scaption 1# Manual : scap mobs, PROM    PATIENT EDUCATION: Education details: HEP Person educated: Patient Education method: Programmer, Multimedia, Demonstration, Actor cues, and Verbal cues Education comprehension: verbalized understanding  HOME EXERCISE PROGRAM: Access Code: [email or web address] URL: [email or web address] Date: 11/28/2023 Prepared by: Burnard Meth  Exercises - Seated Scapular Retraction  - 2 x daily - 7 x weekly - 2 sets - 10 reps - Seated Shoulder Shrugs  - 2 x daily - 7 x weekly - 2 sets - 10 reps - Supine Shoulder Flexion Extension AAROM with Dowel  - 2 x daily - 7 x weekly - 2 sets - 10 reps    ASSESSMENT:  CLINICAL IMPRESSION:  Patient needed  increased rest time to achieve 3rd set increase with some exercises due to fatigue. AROM and strength increased from last measurements.  OBJECTIVE IMPAIRMENTS: decreased ROM, decreased strength, hypomobility, impaired UE functional use, and pain.   ACTIVITY LIMITATIONS: carrying, lifting, sleeping, bathing, dressing, self feeding, and reach over head  PARTICIPATION LIMITATIONS: meal prep, cleaning, laundry, driving, and community activity  PERSONAL FACTORS: None- are also affecting patient's functional outcome.   REHAB POTENTIAL: Good  CLINICAL DECISION MAKING: Stable/uncomplicated  EVALUATION COMPLEXITY: Moderate  GOALS: Goals reviewed with patient? Yes  SHORT TERM GOALS: Target date: 12/19/2023  MET    Pt to be independent with HEP. Baseline: Goal status: MET 12/15/23  2.  Decreased pain by 1 level. Baseline:  Goal status: MET 01/26/24  LONG TERM GOALS: Target date: 03/22/2024 8 more weeks      Patient to be independent in self progressive HEP by discharge. Baseline:  Goal status: Ongoing  01/26/24  2.  Increase active range of motion to WNL throughout. Baseline:  Goal status: Ongoing   01/26/24  3.  Increase strength to at least 4/5 throughout shoulder. Baseline: 2+ > 3 Goal status: Ongoing  01/26/24  4.  Reduce pain with all activities to 2 out of 10 or less. Baseline:  Goal status: Ongoing  01/26/24  5.  Improve PSFS score by at least 2 full points for a measurable improvement. Baseline: 0 Goal status: Ongoing  01/26/24  PLAN: PT FREQUENCY: 2x/week  (16 visits + previous auth 17)  PT DURATION: 8 weeks  PLANNED INTERVENTIONS: 97164- PT Re-evaluation, 97110-Therapeutic exercises, 97530- Therapeutic activity, [id number]- Neuromuscular re-education, 97535- Self Care, 02859- Manual therapy, [id number]- Aquatic Therapy, H9716- Electrical stimulation (unattended), 97016- Vasopneumatic device, [id number]- Ultrasound, [id number]- Traction (mechanical), [id number]- Ionotophoresis 4mg /ml Dexamethasone , Patient/Family education, Joint mobilization, Scar mobilization, Cryotherapy, and Moist heat  PLAN FOR NEXT SESSION:   Continue with progression of ROM and strength.    Burnard Meth, PT 02/23/2024  10:13 AM

## 2024-02-22 NOTE — Progress Notes (Signed)
 Post Operative Evaluation    Procedure/Date of Surgery: Left shoulder arthroscopy with biceps tenodesis 9/11  Interval History:  Presents 12 weeks status post above procedure.  She is continuing to progress with physical therapy.  Does occasionally have some soreness and achiness and some sharp pain which is continuing to improve   PMH/PSH/Family History/Social History/Meds/Allergies:    Past Medical History:  Diagnosis Date   Arthritis    patient denies   Asthma    Back pain    Chest pain    Constipation    Diabetes mellitus without complication (HCC)    type    Food allergy    GERD (gastroesophageal reflux disease)    Heartburn    Hyperlipidemia    Hypertension    Interstitial cystitis    IUD (intrauterine device) in place placed 6 weeks ago   Joint pain    Lactose intolerance    Migraines    MIgraines- not current     OSA (obstructive sleep apnea)    Osteoarthritis    Palpitations    Pneumonia    SOB (shortness of breath)    Swallowing problem    Vitamin D  deficiency    Past Surgical History:  Procedure Laterality Date   ANTERIOR HIP REVISION Right 05/20/2020   Procedure: RIGHT TOTAL HIP REVISION- ploy exhange and hip ball exchange with bone graft;  Surgeon: Vernetta Lonni GRADE, MD;  Location: [facility];  Service: Orthopedics;  Laterality: Right;   DILATION AND EVACUATION  09/30/2011   Procedure: DILATATION AND EVACUATION;  Surgeon: Lynwood FORBES Curlene PONCE, MD;  Location: [facility];  Service: Gynecology;  Laterality: N/A;   left knee surgery      arthroscopy    left shoulder surgery  11/24/2023   TOTAL HIP ARTHROPLASTY Right 03/11/2017   Procedure: RIGHT TOTAL HIP ARTHROPLASTY ANTERIOR APPROACH;  Surgeon: Vernetta Lonni GRADE, MD;  Location: [facility];  Service: Orthopedics;  Laterality: Right;   Social History   Socioeconomic History   Marital status: Married    Spouse name: Not on file   Number of children: Not on file    Years of education: Not on file   Highest education level: Not on file  Occupational History   Not on file  Tobacco Use   Smoking status: Never    Passive exposure: Current   Smokeless tobacco: Never  Vaping Use   Vaping status: Never Used  Substance and Sexual Activity   Alcohol use: No   Drug use: No   Sexual activity: Not on file  Other Topics Concern   Not on file  Social History Narrative   Not on file   Social Drivers of Health   Financial Resource Strain: Not on file  Food Insecurity: Low Risk (07/19/2023)   Received from [facility]   Hunger Vital Sign    Within the past 12 months, you worried that your food would run out before you got money to buy more: Never true    Within the past 12 months, the food you bought just didn't last and you didn't have money to get more. : Never true  Transportation Needs: No Transportation Needs (07/19/2023)   Received from [facility]    In the past 12 months, has lack of reliable transportation kept you from medical  appointments, meetings, work or from getting things needed for daily living? : No  Physical Activity: Not on file  Stress: Not on file  Social Connections: Not on file   Family History  Problem Relation Age of Onset   Stroke Mother    Hyperlipidemia Mother    Hypertension Mother    Diabetes Mother    Heart disease Mother    Hyperlipidemia Father    Hypertension Father    Diabetes Father    Eczema Sister    Asthma Brother    Eczema Daughter    Migraines Daughter    Migraines Daughter    Asthma Son    Allergic rhinitis Neg Hx    Angioedema Neg Hx    Immunodeficiency Neg Hx    Urticaria Neg Hx    Headache Neg Hx    Sleep apnea Neg Hx    Allergies  Allergen Reactions   Amoxicillin Hives, Swelling and Other (See Comments)    Has patient had a PCN reaction causing immediate rash, facial/tongue/throat swelling, SOB or lightheadedness with hypotension: Yes Has patient had a PCN reaction  causing severe rash involving mucus membranes or skin necrosis: No Has patient had a PCN reaction that required hospitalization: Yes Has patient had a PCN reaction occurring within the last 10 years: No  If all of the above answers are NO, then may proceed with Cephalosporin use.    Lisinopril Itching and Cough    Heart racing    Olmesartan     jittery   Bactrim Rash   Current Outpatient Medications  Medication Sig Dispense Refill   acetaminophen  (TYLENOL ) 500 MG tablet Take 1 tablet (500 mg total) by mouth every 8 (eight) hours for 10 days. 30 tablet 0   traMADol  (ULTRAM ) 50 MG tablet Take 1 tablet (50 mg total) by mouth every 6 (six) hours as needed. 20 tablet 0   albuterol  (PROVENTIL ) (2.5 MG/3ML) 0.083% nebulizer solution Take 3 mLs (2.5 mg total) by nebulization every 6 (six) hours as needed for wheezing or shortness of breath. 75 mL 0   albuterol  (VENTOLIN  HFA) 108 (90 Base) MCG/ACT inhaler Inhale 2 puffs into the lungs every 4 (four) hours as needed. 18 g 1   amLODipine  (NORVASC ) 10 MG tablet Take 10 mg by mouth every morning.     aspirin  EC 325 MG tablet Take 1 tablet (325 mg total) by mouth daily. 14 tablet 0   aspirin -acetaminophen -caffeine (EXCEDRIN MIGRAINE) 250-250-65 MG tablet Take 2 tablets by mouth every 6 (six) hours as needed for headache.     budesonide  (PULMICORT ) 0.5 MG/2ML nebulizer solution Take 0.5 mg by nebulization. prn     budesonide -formoterol  (SYMBICORT ) 160-4.5 MCG/ACT inhaler Inhale 2 puffs into the lungs in the morning and at bedtime. 1 each 5   butalbital-acetaminophen -caffeine (FIORICET) 50-325-40 MG tablet      cetirizine  (ZYRTEC ) 10 MG tablet Take 1 tablet (10 mg total) by mouth daily. 90 tablet 1   Cholecalciferol  (VITAMIN D ) 50 MCG (2000 UT) tablet Take 1 tablet (2,000 Units total) by mouth daily. 30 tablet 1   cyclobenzaprine  (FLEXERIL ) 10 MG tablet Take 1 tablet (10 mg total) by mouth 3 (three) times daily as needed for muscle spasms. 60 tablet 1    diphenhydrAMINE  (BENADRYL ) 25 MG tablet Take 25 mg by mouth daily as needed for allergies.     EPINEPHrine  0.3 mg/0.3 mL IJ SOAJ injection Inject 0.3 mg into the muscle as needed for anaphylaxis. 2 each 1   ipratropium (ATROVENT )  0.06 % nasal spray Apply 2 sprays each nostril up to 3-4 times a day as needed 45 mL 1   Lancets (ONETOUCH DELICA PLUS LANCET33G) MISC Apply 1 each topically daily.     levonorgestrel  (MIRENA ) 20 MCG/24HR IUD 1 each by Intrauterine route once.     lidocaine  (LIDODERM ) 5 % Place 1 patch onto the skin daily as needed (pain). Remove & Discard patch within 12 hours or as directed by MD 30 patch 3   losartan  (COZAAR ) 25 MG tablet Take 25 mg by mouth daily.  0   meloxicam  (MOBIC ) 15 MG tablet Take 1 tablet (15 mg total) by mouth daily. 30 tablet 0   meloxicam  (MOBIC ) 15 MG tablet Take 1 tablet (15 mg total) by mouth daily. 30 tablet 0   metFORMIN  (GLUCOPHAGE -XR) 750 MG 24 hr tablet Take 750 mg by mouth 2 (two) times daily.     montelukast  (SINGULAIR ) 10 MG tablet Take 1 tablet (10 mg total) by mouth at bedtime. 90 tablet 1   nabumetone  (RELAFEN ) 750 MG tablet TAKE 1 TABLET BY MOUTH 2 TIMES DAILY AS NEEDED. 60 tablet 1   nystatin cream (MYCOSTATIN) APPLY EXTERNALLY TWICE A DAY AS NEEDED 30     Olopatadine  HCl (PATADAY ) 0.2 % SOLN Place 1 drop into both eyes daily as needed. 7.5 mL 1   omeprazole  (PRILOSEC) 40 MG capsule Take 1 capsule by mouth daily.     ondansetron  (ZOFRAN ) 8 MG tablet Take 8 mg by mouth every 8 (eight) hours as needed for nausea or vomiting.     ONETOUCH VERIO test strip as directed.   3   oxybutynin (DITROPAN-XL) 10 MG 24 hr tablet Take 10 mg by mouth daily.     oxyCODONE  (ROXICODONE ) 5 MG immediate release tablet Take 1 tablet (5 mg total) by mouth every 4 (four) hours as needed for severe pain (pain score 7-10) or breakthrough pain. 30 tablet 0   promethazine -dextromethorphan (PROMETHAZINE -DM) 6.25-15 MG/5ML syrup Take 5 mLs by mouth 3 (three) times daily  as needed for cough. 118 mL 0   rosuvastatin  (CRESTOR ) 10 MG tablet Take 10 mg by mouth daily.  3   solifenacin (VESICARE) 5 MG tablet Take 5 mg by mouth daily.     Spacer/Aero-Holding Chambers DEVI Take 1 each by mouth as directed. 1 each 0   tirzepatide  (MOUNJARO ) 12.5 MG/0.5ML Pen Inject 12.5 mg into the skin once a week. 2 mL 0   traMADol  (ULTRAM ) 50 MG tablet 50 mg.     UBRELVY  100 MG TABS TAKE 1 TABLET BY MOUTH AS NEEDED FOR MIGRAINE. MAY REPEAT ONCE AFTER 2 HOURS     UNABLE TO FIND USE TO CHECK YOUR BLOOD SUGAR FINGER STICK CHECK SUGARS 90 DAYS     VITAMIN D , CHOLECALCIFEROL , PO      zonisamide (ZONEGRAN) 25 MG capsule Take 50 mg by mouth 2 (two) times daily.     No current facility-administered medications for this visit.   No results found.  Review of Systems:   A ROS was performed including pertinent positives and negatives as documented in the HPI.   Musculoskeletal Exam:    There were no vitals taken for this visit.  Left shoulder incisions are well-appearing without erythema or drainage.  Active forward elevation is to 100 degrees.  External rotation at side to 30 degrees internal rotation deferred today just neurosensory intact  Imaging:      I personally reviewed and interpreted the radiographs.   Assessment:  12 weeks status post left shoulder arthroscopy with biceps tenodesis doing well.  She is occasionally having some sharp pain for which I provided she wants tramadol .  Plan :    - Return to clinic as needed      I personally saw and evaluated the patient, and participated in the management and treatment plan.  Elspeth Parker, MD Attending Physician, Orthopedic Surgery  This document was dictated using Dragon voice recognition software. A reasonable attempt at proof reading has been made to minimize errors.

## 2024-02-23 ENCOUNTER — Ambulatory Visit

## 2024-02-23 DIAGNOSIS — M25612 Stiffness of left shoulder, not elsewhere classified: Secondary | ICD-10-CM | POA: Diagnosis not present

## 2024-02-23 DIAGNOSIS — M6281 Muscle weakness (generalized): Secondary | ICD-10-CM

## 2024-02-23 DIAGNOSIS — M25512 Pain in left shoulder: Secondary | ICD-10-CM | POA: Diagnosis not present

## 2024-02-23 DIAGNOSIS — G8929 Other chronic pain: Secondary | ICD-10-CM | POA: Diagnosis not present

## 2024-02-28 ENCOUNTER — Encounter

## 2024-02-29 NOTE — Therapy (Signed)
 OUTPATIENT PHYSICAL THERAPY TREATMENT   Patient Name: Emily Hester MRN: [id number] DOB:[birth year], 53 y.o., female Today's Date: 03/01/2024  END OF SESSION:  PT End of Session - 03/01/24 1059     Visit Number 18    Number of Visits 33    Date for Recertification  03/22/24    PT Start Time 0930    PT Stop Time 1010    PT Time Calculation (min) 40 min    Activity Tolerance Patient tolerated treatment well    Behavior During Therapy WFL for tasks assessed/performed            Past Medical History:  Diagnosis Date   Arthritis    patient denies   Asthma    Back pain    Chest pain    Constipation    Diabetes mellitus without complication (HCC)    type    Food allergy    GERD (gastroesophageal reflux disease)    Heartburn    Hyperlipidemia    Hypertension    Interstitial cystitis    IUD (intrauterine device) in place placed 6 weeks ago   Joint pain    Lactose intolerance    Migraines    MIgraines- not current     OSA (obstructive sleep apnea)    Osteoarthritis    Palpitations    Pneumonia    SOB (shortness of breath)    Swallowing problem    Vitamin D  deficiency    Past Surgical History:  Procedure Laterality Date   ANTERIOR HIP REVISION Right 05/20/2020   Procedure: RIGHT TOTAL HIP REVISION- ploy exhange and hip ball exchange with bone graft;  Surgeon: Vernetta Lonni GRADE, MD;  Location: [facility];  Service: Orthopedics;  Laterality: Right;   DILATION AND EVACUATION  09/30/2011   Procedure: DILATATION AND EVACUATION;  Surgeon: Lynwood FORBES Curlene PONCE, MD;  Location: [facility];  Service: Gynecology;  Laterality: N/A;   left knee surgery      arthroscopy    left shoulder surgery  11/24/2023   TOTAL HIP ARTHROPLASTY Right 03/11/2017   Procedure: RIGHT TOTAL HIP ARTHROPLASTY ANTERIOR APPROACH;  Surgeon: Vernetta Lonni GRADE, MD;  Location: [facility];  Service: Orthopedics;  Laterality: Right;   Patient Active Problem List   Diagnosis Date Noted   BMI  40.0-44.9, adult (HCC) current 40.05 10/20/2023   Vitamin D  deficiency 10/20/2023   Other fatigue 10/20/2023   Lactose intolerance 10/20/2023   DOE (dyspnea on exertion) 10/20/2023   Nut allergy 10/20/2023   Herpes simplex type 1 infection 10/13/2022   Chronic interstitial cystitis 10/13/2022   Morbid obesity (HCC) 10/13/2022   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HCC) 05/20/2020   Status post revision of total hip 05/20/2020   Trochanteric bursitis, right hip 09/05/2019   Hyperlipidemia 08/31/2019   Diabetes mellitus (HCC) 08/31/2019   Arthritis 08/31/2019   Overactive bladder 08/31/2019   Hypertensive disorder 08/30/2018   Asthma 08/30/2018   Migraine 08/30/2018   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   Unilateral primary osteoarthritis, right hip 01/27/2017   IUD (intrauterine device) in place 01/13/2017    PCP: [name], MD  REFERRING PROVIDER: Genelle Standing, MD  REFERRING DIAG: [phone] (ICD-10-CM) - Chronic left shoulder pain   THERAPY DIAG:  Chronic left shoulder pain  Stiffness of left shoulder, not elsewhere classified  Muscle weakness (generalized)  Acute pain of left shoulder  Rationale for Evaluation and Treatment: Rehabilitation  ONSET DATE:   11/24/2023 surgery   SUBJECTIVE:  SUBJECTIVE STATEMENT: Patient reports feeling more mobile. Hand dominance: Right  PERTINENT HISTORY: L shoulder injection 06/15/23  PAIN:   NPRS scale: 3/10  Pain location: left shoulder anterior Pain description: stinging, burning Aggravating factors: motion Relieving factors: ice  PRECAUTIONS: Lt Shoulder scope 11/24/23 AROM prn- follow biceps tenodesis protocol  RED FLAGS: None   WEIGHT BEARING RESTRICTIONS: No  FALLS:  Has patient fallen in last 6 months?  Unknown  LIVING ENVIRONMENT: Lives with: lives with their family Lives in: House/apartment Has following equipment at home: sling  OCCUPATION: Not working  PLOF: Independent  PATIENT GOALS: decrease pain  NEXT MD VISIT: 12/08/23  OBJECTIVE:  Note: Objective measures were completed at Evaluation unless otherwise noted.  DIAGNOSTIC FINDINGS:  MRI L Shoulder 05/19/23 IMPRESSION: 1. Mild supraspinatus tendinosis without a tear.  PATIENT SURVEYS :  PSFS: THE PATIENT SPECIFIC FUNCTIONAL SCALE  Place score of 0-10 (0 = unable to perform activity and 10 = able to perform activity at the same level as before injury or problem)  Activity Date: 11/28/23 01/13/2024 01/26/24 12//25  Do hairstyle 0 2 4   2.Pick up heavy items 0 4 Gal milk 0   3.Sleep on arm  0 4 0   4. Lift arm up 0 3 4   Total Score 0 3.25 2     Total Score = Sum of activity scores/number of activities  Minimally Detectable Change: 3 points (for single activity); 2 points (for average score)  [facility] (nd). The Patient Specific Functional Scale . Retrieved from [email or web address]   COGNITION: Eval   Overall cognitive status: Within functional limits for tasks assessed     SENSATION: Eval: WFL  POSTURE: Eval L shoulder higher, rounded shoulders moderate  UPPER EXTREMITY ROM:    AROM/PROM Left eval Left 9/172025 Supine  12/08/2023 12/15/23  01/13/2024 AROM in supine 01/26/24 AROM in supine 02/23/24 AROM in supine  Shoulder flexion 22/90 115 PROM 124deg PROM 132 AA supine 130 138 142  Shoulder extension 40/        Shoulder abduction 25/45 90 PROM 90deg PROM (painful)   98 then P 125  Shoulder adduction         Shoulder internal rotation Crest/30 at 45    70 AROM in 45 deg abduction Sacrum Crest   Shoulder external rotation 15/20 30 PROM in 30 deg abduction 20deg PROM in 30deg abduction   65 AROM in 45 deg abduction 35 in 45 deg  abduction 40  Elbow flexion         Elbow extension         Wrist flexion         Wrist extension         Wrist ulnar deviation         Wrist radial deviation         Wrist pronation         Wrist supination         (Blank rows = not tested)  UPPER EXTREMITY MMT:  MMT Left eval LEFT  01/26/24 02/23/24  Shoulder flexion 2+ 3- 3  Shoulder extension 3 3+ 3+  Shoulder abduction 2+ 3- 3  Shoulder adduction     Shoulder internal rotation 3 3+ 3+  Shoulder external rotation 3 3 3   Middle trapezius     Lower trapezius     Elbow flexion     Elbow extension     Wrist flexion     Wrist extension  Wrist ulnar deviation     Wrist radial deviation     Wrist pronation     Wrist supination     Grip strength (lbs)     (Blank rows = not tested)  SHOULDER SPECIAL TESTS: Eval No special tests due to post op status  JOINT MOBILITY TESTING:  Eval Decreased throughout   PALPATION:  Eval 2+ tenderness at incisions and mid deltoid.                     TREATMENT  L Shoulder      DATE:   03/01/24 TherAct:  UBE 3 min each way no resistance Wall ladder into shoulder flexion x10 Supine chest press/flexion with 2# bar 3x10 Scaption 1#  2x10  Lift ups to 2nd shelf with 1# DB 10x Neuro Re-Ed:  IR active TB 10x3 with 2-3s hold green ER active with red TB 10x 3 with 2-3s hold  TB rows green  3x10 Ball on wall 20x CW/CCW Manual : PROM/scap mobs   02/23/24 TherAct:  UBE 3 min each way no resistance Wall ladder into shoulder flexion x10 Supine chest press/flexion with 2# bar 3x10 Scaption 1#  2x10  Neuro Re-Ed:  IR active TB 10x3 with 2-3s hold Red ER walkouts with red TB 10x 3 with 2-3s hold  TB rows green  3x10 Ball on wall 20x CW/CCW    02/14/24 TherAct:  UBE 2 min each way no resistance Wall ladder into shoulder flexion x10, shoulder abduction x5 Supine chest press/flexion with 2# bar 2x10 Scaption 1#  2x10  Neuro Re-Ed:  IR walkouts with green TB 10x2 with 2-3s  hold  ER walkouts with red TB 10x 2with 2-3s hold  TB rows green  3x10 Ball on wall 20x CW/CCW    PATIENT EDUCATION: Education details: HEP Person educated: Patient Education method: Programmer, Multimedia, Facilities Manager, Actor cues, and Verbal cues Education comprehension: verbalized understanding  HOME EXERCISE PROGRAM: Access Code: [email or web address] URL: [email or web address] Date: 11/28/2023 Prepared by: Burnard Meth  Exercises - Seated Scapular Retraction  - 2 x daily - 7 x weekly - 2 sets - 10 reps - Seated Shoulder Shrugs  - 2 x daily - 7 x weekly - 2 sets - 10 reps - Supine Shoulder Flexion Extension AAROM with Dowel  - 2 x daily - 7 x weekly - 2 sets - 10 reps    ASSESSMENT:  CLINICAL IMPRESSION:  Patient demonstrates increased strength with ability to increase work with TB exercises.  OBJECTIVE IMPAIRMENTS: decreased ROM, decreased strength, hypomobility, impaired UE functional use, and pain.   ACTIVITY LIMITATIONS: carrying, lifting, sleeping, bathing, dressing, self feeding, and reach over head  PARTICIPATION LIMITATIONS: meal prep, cleaning, laundry, driving, and community activity  PERSONAL FACTORS: None- are also affecting patient's functional outcome.   REHAB POTENTIAL: Good  CLINICAL DECISION MAKING: Stable/uncomplicated  EVALUATION COMPLEXITY: Moderate  GOALS: Goals reviewed with patient? Yes  SHORT TERM GOALS: Target date: 12/19/2023  MET    Pt to be independent with HEP. Baseline: Goal status: MET 12/15/23  2.  Decreased pain by 1 level. Baseline:  Goal status: MET 01/26/24  LONG TERM GOALS: Target date: 03/22/2024 8 more weeks   Patient to be independent in self progressive HEP by discharge. Baseline:  Goal status: Ongoing  01/26/24  2.  Increase active range of motion to WNL throughout. Baseline:  Goal status: Ongoing  01/26/24  3.  Increase strength to at least 4/5 throughout shoulder. Baseline: 2+ > 3 Goal status:  Ongoing   01/26/24  4.  Reduce pain with all activities to 2 out of 10 or less. Baseline:  Goal status: Ongoing  01/26/24  5.  Improve PSFS score by at least 2 full points for a measurable improvement. Baseline: 0 Goal status: Ongoing  01/26/24  PLAN: PT FREQUENCY: 2x/week  (16 visits + previous auth 17)  PT DURATION: 8 weeks  PLANNED INTERVENTIONS: 97164- PT Re-evaluation, 97110-Therapeutic exercises, 97530- Therapeutic activity, [id number]- Neuromuscular re-education, 97535- Self Care, [id number]- Manual therapy, [id number]- Aquatic Therapy, [id number]- Electrical stimulation (unattended), 97016- Vasopneumatic device, [id number]- Ultrasound, [id number]- Traction (mechanical), [id number]- Ionotophoresis 4mg /ml Dexamethasone , Patient/Family education, Joint mobilization, Scar mobilization, Cryotherapy, and Moist heat  PLAN FOR NEXT SESSION:   Continue with progression of ROM and strength.    Burnard Meth, PT 03/01/2024  11:04 AM

## 2024-03-01 ENCOUNTER — Ambulatory Visit

## 2024-03-01 DIAGNOSIS — G8929 Other chronic pain: Secondary | ICD-10-CM | POA: Diagnosis not present

## 2024-03-01 DIAGNOSIS — M25612 Stiffness of left shoulder, not elsewhere classified: Secondary | ICD-10-CM

## 2024-03-01 DIAGNOSIS — M25512 Pain in left shoulder: Secondary | ICD-10-CM

## 2024-03-01 DIAGNOSIS — M6281 Muscle weakness (generalized): Secondary | ICD-10-CM | POA: Diagnosis not present

## 2024-03-02 NOTE — Therapy (Signed)
 " OUTPATIENT PHYSICAL THERAPY TREATMENT   Patient Name: Emily Hester MRN: [id number] DOB:[birth year], 53 y.o., female Today's Date: 03/05/2024  END OF SESSION:  PT End of Session - 03/05/24 1000     Visit Number 19    Number of Visits 33    Date for Recertification  03/22/24    Authorization Type [employer]    PT Start Time [phone]    PT Stop Time 1011    PT Time Calculation (min) 38 min    Activity Tolerance Patient tolerated treatment well    Behavior During Therapy WFL for tasks assessed/performed             Past Medical History:  Diagnosis Date   Arthritis    patient denies   Asthma    Back pain    Chest pain    Constipation    Diabetes mellitus without complication (HCC)    type    Food allergy    GERD (gastroesophageal reflux disease)    Heartburn    Hyperlipidemia    Hypertension    Interstitial cystitis    IUD (intrauterine device) in place placed 6 weeks ago   Joint pain    Lactose intolerance    Migraines    MIgraines- not current     OSA (obstructive sleep apnea)    Osteoarthritis    Palpitations    Pneumonia    SOB (shortness of breath)    Swallowing problem    Vitamin D  deficiency    Past Surgical History:  Procedure Laterality Date   ANTERIOR HIP REVISION Right 05/20/2020   Procedure: RIGHT TOTAL HIP REVISION- ploy exhange and hip ball exchange with bone graft;  Surgeon: Vernetta Lonni GRADE, MD;  Location: [facility];  Service: Orthopedics;  Laterality: Right;   DILATION AND EVACUATION  09/30/2011   Procedure: DILATATION AND EVACUATION;  Surgeon: Lynwood FORBES Curlene PONCE, MD;  Location: [facility];  Service: Gynecology;  Laterality: N/A;   left knee surgery      arthroscopy    left shoulder surgery  11/24/2023   TOTAL HIP ARTHROPLASTY Right 03/11/2017   Procedure: RIGHT TOTAL HIP ARTHROPLASTY ANTERIOR APPROACH;  Surgeon: Vernetta Lonni GRADE, MD;  Location: [facility];  Service: Orthopedics;  Laterality: Right;   Patient Active Problem List    Diagnosis Date Noted   BMI 40.0-44.9, adult (HCC) current 40.05 10/20/2023   Vitamin D  deficiency 10/20/2023   Other fatigue 10/20/2023   Lactose intolerance 10/20/2023   DOE (dyspnea on exertion) 10/20/2023   Nut allergy 10/20/2023   Herpes simplex type 1 infection 10/13/2022   Chronic interstitial cystitis 10/13/2022   Morbid obesity (HCC) 10/13/2022   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HCC) 05/20/2020   Status post revision of total hip 05/20/2020   Trochanteric bursitis, right hip 09/05/2019   Hyperlipidemia 08/31/2019   Diabetes mellitus (HCC) 08/31/2019   Arthritis 08/31/2019   Overactive bladder 08/31/2019   Hypertensive disorder 08/30/2018   Asthma 08/30/2018   Migraine 08/30/2018   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   Unilateral primary osteoarthritis, right hip 01/27/2017   IUD (intrauterine device) in place 01/13/2017    PCP: [name], MD  REFERRING PROVIDER: [name], MD  REFERRING DIAG: [phone] (ICD-10-CM) - Chronic left shoulder pain   THERAPY DIAG:  Chronic left shoulder pain  Stiffness of left shoulder, not elsewhere classified  Muscle weakness (generalized)  Acute pain of left shoulder  Rationale for Evaluation and Treatment: Rehabilitation  ONSET DATE:  11/24/2023 surgery   SUBJECTIVE:                                                                                                                                                                                      SUBJECTIVE STATEMENT: Patient reports feeling okay. Hand dominance: Right  PERTINENT HISTORY: L shoulder injection 06/15/23  PAIN:   NPRS scale: 2/10  Pain location: left shoulder anterior Pain description: stinging, burning Aggravating factors: motion Relieving factors: ice  PRECAUTIONS: Lt Shoulder scope 11/24/23 AROM prn- follow biceps tenodesis protocol  RED FLAGS: None   WEIGHT BEARING RESTRICTIONS: No  FALLS:  Has patient fallen  in last 6 months? Unknown  LIVING ENVIRONMENT: Lives with: lives with their family Lives in: House/apartment Has following equipment at home: sling  OCCUPATION: Not working  PLOF: Independent  PATIENT GOALS: decrease pain  NEXT MD VISIT: 12/08/23  OBJECTIVE:  Note: Objective measures were completed at Evaluation unless otherwise noted.  DIAGNOSTIC FINDINGS:  MRI L Shoulder 05/19/23 IMPRESSION: 1. Mild supraspinatus tendinosis without a tear.  PATIENT SURVEYS :  PSFS: THE PATIENT SPECIFIC FUNCTIONAL SCALE  Place score of 0-10 (0 = unable to perform activity and 10 = able to perform activity at the same level as before injury or problem)  Activity Date: 11/28/23 01/13/2024 01/26/24 03/05/24  Do hairstyle 0 2 4 8   2.Pick up heavy items 0 4 Gal milk 0 3  3.Sleep on arm  0 4 0 5  4. Lift arm up 0 3 4 7   Total Score 0 3.25 2 5.75    Total Score = Sum of activity scores/number of activities  Minimally Detectable Change: 3 points (for single activity); 2 points (for average score)  [facility] (nd). The Patient Specific Functional Scale . Retrieved from [employer]ateo[employer]com.[email or web address]   COGNITION: Eval   Overall cognitive status: Within functional limits for tasks assessed     SENSATION: Eval: WFL  POSTURE: Eval L shoulder higher, rounded shoulders moderate  UPPER EXTREMITY ROM:    AROM/PROM Left eval Left 9/172025 Supine  12/08/2023 12/15/23  01/13/2024 AROM in supine 01/26/24 AROM in supine 02/23/24 AROM in supine  Shoulder flexion 22/90 115 PROM 124deg PROM 132 AA supine 130 138 142  Shoulder extension 40/        Shoulder abduction 25/45 90 PROM 90deg PROM (painful)   98 then P 125  Shoulder adduction         Shoulder internal rotation Crest/30 at 45    70 AROM in 45 deg abduction Sacrum Crest   Shoulder external rotation 15/20 30 PROM in 30 deg abduction 20deg PROM in 30deg abduction   65 AROM  in 45 deg  abduction 35 in 45 deg abduction 40  Elbow flexion         Elbow extension         Wrist flexion         Wrist extension         Wrist ulnar deviation         Wrist radial deviation         Wrist pronation         Wrist supination         (Blank rows = not tested)  UPPER EXTREMITY MMT:  MMT Left eval LEFT  01/26/24 02/23/24  Shoulder flexion 2+ 3- 3  Shoulder extension 3 3+ 3+  Shoulder abduction 2+ 3- 3  Shoulder adduction     Shoulder internal rotation 3 3+ 3+  Shoulder external rotation 3 3 3   Middle trapezius     Lower trapezius     Elbow flexion     Elbow extension     Wrist flexion     Wrist extension     Wrist ulnar deviation     Wrist radial deviation     Wrist pronation     Wrist supination     Grip strength (lbs)     (Blank rows = not tested)  SHOULDER SPECIAL TESTS: Eval No special tests due to post op status  JOINT MOBILITY TESTING:  Eval Decreased throughout   PALPATION:  Eval 2+ tenderness at incisions and mid deltoid.                     TREATMENT  L Shoulder      DATE:   03/05/24 TherAct:  UBE 3 min each way no resistance Wall ladder into shoulder flexion x10 Supine chest press/flexion with 3# bar 2x10 Scaption 1#  2x10  Neuro Re-Ed:  IR active TB 10x3 with 2-3s hold green ER active with red TB 10x 3 with 2-3s hold  TB rows green  3x10 Ball on wall 20x CW/CCW Manual : PROM/scap mobs  03/01/24 TherAct:  UBE 3 min each way no resistance Wall ladder into shoulder flexion x10 Supine chest press/flexion with 2# bar 3x10 Scaption 1#  2x10  Lift ups to 2nd shelf with 1# DB 10x Neuro Re-Ed:  IR active TB 10x3 with 2-3s hold green ER active with red TB 10x 3 with 2-3s hold  TB rows green  3x10 Ball on wall 20x CW/CCW Manual : PROM/scap mobs   02/23/24 TherAct:  UBE 3 min each way no resistance Wall ladder into shoulder flexion x10 Supine chest press/flexion with 2# bar 3x10 Scaption 1#  2x10  Neuro Re-Ed:  IR active TB 10x3  with 2-3s hold Red ER walkouts with red TB 10x 3 with 2-3s hold  TB rows green  3x10 Ball on wall 20x CW/CCW    PATIENT EDUCATION: Education details: HEP Person educated: Patient Education method: Programmer, Multimedia, Facilities Manager, Actor cues, and Verbal cues Education comprehension: verbalized understanding  HOME EXERCISE PROGRAM: Access Code: [email or web address] URL: [email or web address] Date: 11/28/2023 Prepared by: Burnard Meth  Exercises - Seated Scapular Retraction  - 2 x daily - 7 x weekly - 2 sets - 10 reps - Seated Shoulder Shrugs  - 2 x daily - 7 x weekly - 2 sets - 10 reps - Supine Shoulder Flexion Extension AAROM with Dowel  - 2 x daily - 7 x weekly - 2 sets - 10 reps    ASSESSMENT:  CLINICAL IMPRESSION:  Patient with improved ROM and strength as demonstrated by increased PSFS score to 5.75. OBJECTIVE IMPAIRMENTS: decreased ROM, decreased strength, hypomobility, impaired UE functional use, and pain.   ACTIVITY LIMITATIONS: carrying, lifting, sleeping, bathing, dressing, self feeding, and reach over head  PARTICIPATION LIMITATIONS: meal prep, cleaning, laundry, driving, and community activity  PERSONAL FACTORS: None- are also affecting patient's functional outcome.   REHAB POTENTIAL: Good  CLINICAL DECISION MAKING: Stable/uncomplicated  EVALUATION COMPLEXITY: Moderate  GOALS: Goals reviewed with patient? Yes  SHORT TERM GOALS: Target date: 12/19/2023  MET    Pt to be independent with HEP. Baseline: Goal status: MET 12/15/23  2.  Decreased pain by 1 level. Baseline:  Goal status: MET 01/26/24  LONG TERM GOALS: Target date: 03/22/2024 8 more weeks   Patient to be independent in self progressive HEP by discharge. Baseline:  Goal status: Ongoing  01/26/24  2.  Increase active range of motion to WNL throughout. Baseline:  Goal status: Ongoing  01/26/24  3.  Increase strength to at least 4/5 throughout shoulder. Baseline: 2+ > 3 Goal status:  Ongoing  01/26/24  4.  Reduce pain with all activities to 2 out of 10 or less. Baseline:  Goal status: Ongoing  01/26/24  5.  Improve PSFS score by at least 2 full points for a measurable improvement. Baseline: 0 Goal status:MET 03/05/24  PLAN: PT FREQUENCY: 2x/week  (16 visits + previous auth 17)  PT DURATION: 8 weeks  PLANNED INTERVENTIONS: 97164- PT Re-evaluation, 97110-Therapeutic exercises, 97530- Therapeutic activity, [id number]- Neuromuscular re-education, 97535- Self Care, 02859- Manual therapy, [id number]- Aquatic Therapy, H9716- Electrical stimulation (unattended), 97016- Vasopneumatic device, [id number]- Ultrasound, [id number]- Traction (mechanical), [id number]- Ionotophoresis 4mg /ml Dexamethasone , Patient/Family education, Joint mobilization, Scar mobilization, Cryotherapy, and Moist heat  PLAN FOR NEXT SESSION:   Continue with progression of ROM and strength.    Burnard Meth, PT 03/05/2024  10:19 AM      "

## 2024-03-05 ENCOUNTER — Ambulatory Visit (INDEPENDENT_AMBULATORY_CARE_PROVIDER_SITE_OTHER)

## 2024-03-05 DIAGNOSIS — M25512 Pain in left shoulder: Secondary | ICD-10-CM

## 2024-03-05 DIAGNOSIS — M25612 Stiffness of left shoulder, not elsewhere classified: Secondary | ICD-10-CM | POA: Diagnosis not present

## 2024-03-05 DIAGNOSIS — M6281 Muscle weakness (generalized): Secondary | ICD-10-CM | POA: Diagnosis not present

## 2024-03-05 DIAGNOSIS — G8929 Other chronic pain: Secondary | ICD-10-CM

## 2024-03-14 NOTE — Therapy (Signed)
 " OUTPATIENT PHYSICAL THERAPY TREATMENT   Patient Name: Emily Hester MRN: [id number] DOB:1970-11-28, 53 y.o., female Today's Date: 03/14/2024  END OF SESSION:       Past Medical History:  Diagnosis Date   Arthritis    patient denies   Asthma    Back pain    Chest pain    Constipation    Diabetes mellitus without complication (HCC)    type    Food allergy    GERD (gastroesophageal reflux disease)    Heartburn    Hyperlipidemia    Hypertension    Interstitial cystitis    IUD (intrauterine device) in place placed 6 weeks ago   Joint pain    Lactose intolerance    Migraines    MIgraines- not current     OSA (obstructive sleep apnea)    Osteoarthritis    Palpitations    Pneumonia    SOB (shortness of breath)    Swallowing problem    Vitamin D  deficiency    Past Surgical History:  Procedure Laterality Date   ANTERIOR HIP REVISION Right 05/20/2020   Procedure: RIGHT TOTAL HIP REVISION- ploy exhange and hip ball exchange with bone graft;  Surgeon: Vernetta Lonni GRADE, MD;  Location: [facility];  Service: Orthopedics;  Laterality: Right;   DILATION AND EVACUATION  09/30/2011   Procedure: DILATATION AND EVACUATION;  Surgeon: Lynwood FORBES Curlene PONCE, MD;  Location: [facility];  Service: Gynecology;  Laterality: N/A;   left knee surgery      arthroscopy    left shoulder surgery  11/24/2023   TOTAL HIP ARTHROPLASTY Right 03/11/2017   Procedure: RIGHT TOTAL HIP ARTHROPLASTY ANTERIOR APPROACH;  Surgeon: Vernetta Lonni GRADE, MD;  Location: [facility];  Service: Orthopedics;  Laterality: Right;   Patient Active Problem List   Diagnosis Date Noted   BMI 40.0-44.9, adult (HCC) current 40.05 10/20/2023   Vitamin D  deficiency 10/20/2023   Other fatigue 10/20/2023   Lactose intolerance 10/20/2023   DOE (dyspnea on exertion) 10/20/2023   Nut allergy 10/20/2023   Herpes simplex type 1 infection 10/13/2022   Chronic interstitial cystitis 10/13/2022   Morbid obesity (HCC)  10/13/2022   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HCC) 05/20/2020   Status post revision of total hip 05/20/2020   Trochanteric bursitis, right hip 09/05/2019   Hyperlipidemia 08/31/2019   Diabetes mellitus (HCC) 08/31/2019   Arthritis 08/31/2019   Overactive bladder 08/31/2019   Hypertensive disorder 08/30/2018   Asthma 08/30/2018   Migraine 08/30/2018   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   Unilateral primary osteoarthritis, right hip 01/27/2017   IUD (intrauterine device) in place 01/13/2017    PCP: [name], MD  REFERRING PROVIDER: [name], MD  REFERRING DIAG: [phone] (ICD-10-CM) - Chronic left shoulder pain   THERAPY DIAG:  No diagnosis found.  Rationale for Evaluation and Treatment: Rehabilitation  ONSET DATE:   11/24/2023 surgery   SUBJECTIVE:  SUBJECTIVE STATEMENT: ***Patient reports feeling okay. Hand dominance: Right  PERTINENT HISTORY: L shoulder injection 06/15/23  PAIN:   ***NPRS scale: 2/10  Pain location: left shoulder anterior Pain description: stinging, burning Aggravating factors: motion Relieving factors: ice  PRECAUTIONS: Lt Shoulder scope 11/24/23 AROM prn- follow biceps tenodesis protocol  RED FLAGS: None   WEIGHT BEARING RESTRICTIONS: No  FALLS:  Has patient fallen in last 6 months? Unknown  LIVING ENVIRONMENT: Lives with: lives with their family Lives in: House/apartment Has following equipment at home: sling  OCCUPATION: Not working  PLOF: Independent  PATIENT GOALS: decrease pain  NEXT MD VISIT: 12/08/23  OBJECTIVE:  Note: Objective measures were completed at Evaluation unless otherwise noted.  DIAGNOSTIC FINDINGS:  MRI L Shoulder 05/19/23 IMPRESSION: 1. Mild supraspinatus tendinosis without a  tear.  PATIENT SURVEYS :  PSFS: THE PATIENT SPECIFIC FUNCTIONAL SCALE  Place score of 0-10 (0 = unable to perform activity and 10 = able to perform activity at the same level as before injury or problem)  Activity Date: 11/28/23 01/13/2024 01/26/24 03/05/24  Do hairstyle 0 2 4 8   2.Pick up heavy items 0 4 Gal milk 0 3  3.Sleep on arm  0 4 0 5  4. Lift arm up 0 3 4 7   Total Score 0 3.25 2 5.75    Total Score = Sum of activity scores/number of activities  Minimally Detectable Change: 3 points (for single activity); 2 points (for average score)  [facility] (nd). The Patient Specific Functional Scale . Retrieved from [employer].pt   COGNITION: Eval   Overall cognitive status: Within functional limits for tasks assessed     SENSATION: Eval: WFL  POSTURE: Eval L shoulder higher, rounded shoulders moderate  UPPER EXTREMITY ROM:    AROM/PROM Left eval Left 9/172025 Supine  12/08/2023 12/15/23  01/13/2024 AROM in supine 01/26/24 AROM in supine 02/23/24 AROM in supine 03/16/24***  Shoulder flexion 22/90 115 PROM 124deg PROM 132 AA supine 130 138 142   Shoulder extension 40/         Shoulder abduction 25/45 90 PROM 90deg PROM (painful)   98 then P 125   Shoulder adduction          Shoulder internal rotation Crest/30 at 45    70 AROM in 45 deg abduction Sacrum Crest    Shoulder external rotation 15/20 30 PROM in 30 deg abduction 20deg PROM in 30deg abduction   65 AROM in 45 deg abduction 35 in 45 deg abduction 40   Elbow flexion          Elbow extension          Wrist flexion          Wrist extension          Wrist ulnar deviation          Wrist radial deviation          Wrist pronation          Wrist supination          (Blank rows = not tested)  UPPER EXTREMITY MMT:  MMT Left eval LEFT  01/26/24 02/23/24 03/20/24 ***  Shoulder flexion 2+ 3- 3   Shoulder extension 3 3+ 3+   Shoulder abduction 2+  3- 3   Shoulder adduction      Shoulder internal rotation 3 3+ 3+   Shoulder external rotation 3 3 3    Middle trapezius      Lower trapezius  Elbow flexion      Elbow extension      Wrist flexion      Wrist extension      Wrist ulnar deviation      Wrist radial deviation      Wrist pronation      Wrist supination      Grip strength (lbs)      (Blank rows = not tested)  SHOULDER SPECIAL TESTS: Eval No special tests due to post op status  JOINT MOBILITY TESTING:  Eval Decreased throughout   PALPATION:  Eval 2+ tenderness at incisions and mid deltoid.                     TREATMENT  L Shoulder      DATE:   03/16/24***      03/05/24 TherAct:  UBE 3 min each way no resistance Wall ladder into shoulder flexion x10 Supine chest press/flexion with 3# bar 2x10 Scaption 1#  2x10  Neuro Re-Ed:  IR active TB 10x3 with 2-3s hold green ER active with red TB 10x 3 with 2-3s hold  TB rows green  3x10 Ball on wall 20x CW/CCW Manual : PROM/scap mobs  03/01/24 TherAct:  UBE 3 min each way no resistance Wall ladder into shoulder flexion x10 Supine chest press/flexion with 2# bar 3x10 Scaption 1#  2x10  Lift ups to 2nd shelf with 1# DB 10x Neuro Re-Ed:  IR active TB 10x3 with 2-3s hold green ER active with red TB 10x 3 with 2-3s hold  TB rows green  3x10 Ball on wall 20x CW/CCW Manual : PROM/scap mobs   02/23/24 TherAct:  UBE 3 min each way no resistance Wall ladder into shoulder flexion x10 Supine chest press/flexion with 2# bar 3x10 Scaption 1#  2x10  Neuro Re-Ed:  IR active TB 10x3 with 2-3s hold Red ER walkouts with red TB 10x 3 with 2-3s hold  TB rows green  3x10 Ball on wall 20x CW/CCW    PATIENT EDUCATION: Education details: HEP Person educated: Patient Education method: Programmer, Multimedia, Facilities Manager, Actor cues, and Verbal cues Education comprehension: verbalized understanding  HOME EXERCISE PROGRAM: Access Code: [email or web address] URL:  [email or web address] Date: 11/28/2023 Prepared by: Burnard Meth  Exercises - Seated Scapular Retraction  - 2 x daily - 7 x weekly - 2 sets - 10 reps - Seated Shoulder Shrugs  - 2 x daily - 7 x weekly - 2 sets - 10 reps - Supine Shoulder Flexion Extension AAROM with Dowel  - 2 x daily - 7 x weekly - 2 sets - 10 reps    ASSESSMENT:  CLINICAL IMPRESSION:  ***Patient with improved ROM and strength as demonstrated by increased PSFS score to 5.75. OBJECTIVE IMPAIRMENTS: decreased ROM, decreased strength, hypomobility, impaired UE functional use, and pain.   ACTIVITY LIMITATIONS: carrying, lifting, sleeping, bathing, dressing, self feeding, and reach over head  PARTICIPATION LIMITATIONS: meal prep, cleaning, laundry, driving, and community activity  PERSONAL FACTORS: None- are also affecting patient's functional outcome.   REHAB POTENTIAL: Good  CLINICAL DECISION MAKING: Stable/uncomplicated  EVALUATION COMPLEXITY: Moderate  GOALS: Goals reviewed with patient? Yes  SHORT TERM GOALS: Target date: 12/19/2023  MET    Pt to be independent with HEP. Baseline: Goal status: MET 12/15/23  2.  Decreased pain by 1 level. Baseline:  Goal status: MET 01/26/24  LONG TERM GOALS: Target date: 03/22/2024 8 more weeks***   Patient to be independent in self progressive HEP by discharge. Baseline:  Goal status:  Ongoing  01/26/24  2.  Increase active range of motion to WNL throughout. Baseline:  Goal status: Ongoing  01/26/24  3.  Increase strength to at least 4/5 throughout shoulder. Baseline: 2+ > 3 Goal status: Ongoing  01/26/24  4.  Reduce pain with all activities to 2 out of 10 or less. Baseline:  Goal status: Ongoing  01/26/24  5.  Improve PSFS score by at least 2 full points for a measurable improvement. Baseline: 0 Goal status:MET 03/05/24  PLAN: PT FREQUENCY: 2x/week  (16 visits + previous auth 17)  PT DURATION: 8 weeks  PLANNED INTERVENTIONS: 97164- PT  Re-evaluation, 97110-Therapeutic exercises, 97530- Therapeutic activity, [id number]- Neuromuscular re-education, 97535- Self Care, 02859- Manual therapy, [id number]- Aquatic Therapy, H9716- Electrical stimulation (unattended), 97016- Vasopneumatic device, [id number]- Ultrasound, [id number]- Traction (mechanical), [id number]- Ionotophoresis 4mg /ml Dexamethasone , Patient/Family education, Joint mobilization, Scar mobilization, Cryotherapy, and Moist heat  PLAN FOR NEXT SESSION:   ***Continue with progression of ROM and strength.    Burnard Meth, PT 03/14/2024  7:56 AM      "

## 2024-03-15 ENCOUNTER — Other Ambulatory Visit (INDEPENDENT_AMBULATORY_CARE_PROVIDER_SITE_OTHER): Payer: Self-pay | Admitting: Nurse Practitioner

## 2024-03-15 DIAGNOSIS — E1169 Type 2 diabetes mellitus with other specified complication: Secondary | ICD-10-CM

## 2024-03-16 ENCOUNTER — Ambulatory Visit

## 2024-03-16 DIAGNOSIS — M25612 Stiffness of left shoulder, not elsewhere classified: Secondary | ICD-10-CM

## 2024-03-16 DIAGNOSIS — M6281 Muscle weakness (generalized): Secondary | ICD-10-CM | POA: Diagnosis not present

## 2024-03-16 DIAGNOSIS — M25512 Pain in left shoulder: Secondary | ICD-10-CM

## 2024-03-16 DIAGNOSIS — G8929 Other chronic pain: Secondary | ICD-10-CM

## 2024-03-18 NOTE — Therapy (Signed)
 " OUTPATIENT PHYSICAL THERAPY TREATMENT/DISCHARGE   Patient Name: Emily Hester MRN: [id number] DOB:[birth year], 54 y.o., female Today's Date: 03/20/2024  END OF SESSION:  PT End of Session - 03/20/24 0936     Visit Number 21    Number of Visits 33    Date for Recertification  03/22/24    Authorization Type [employer]    PT Start Time 0930    PT Stop Time 1002    PT Time Calculation (min) 32 min    Activity Tolerance Patient tolerated treatment well    Behavior During Therapy WFL for tasks assessed/performed            Past Medical History:  Diagnosis Date   Arthritis    patient denies   Asthma    Back pain    Chest pain    Constipation    Diabetes mellitus without complication (HCC)    type    Food allergy    GERD (gastroesophageal reflux disease)    Heartburn    Hyperlipidemia    Hypertension    Interstitial cystitis    IUD (intrauterine device) in place placed 6 weeks ago   Joint pain    Lactose intolerance    Migraines    MIgraines- not current     OSA (obstructive sleep apnea)    Osteoarthritis    Palpitations    Pneumonia    SOB (shortness of breath)    Swallowing problem    Vitamin D  deficiency    Past Surgical History:  Procedure Laterality Date   ANTERIOR HIP REVISION Right 05/20/2020   Procedure: RIGHT TOTAL HIP REVISION- ploy exhange and hip ball exchange with bone graft;  Surgeon: Vernetta Lonni GRADE, MD;  Location: [facility];  Service: Orthopedics;  Laterality: Right;   DILATION AND EVACUATION  09/30/2011   Procedure: DILATATION AND EVACUATION;  Surgeon: Lynwood FORBES Curlene PONCE, MD;  Location: [facility];  Service: Gynecology;  Laterality: N/A;   left knee surgery      arthroscopy    left shoulder surgery  11/24/2023   TOTAL HIP ARTHROPLASTY Right 03/11/2017   Procedure: RIGHT TOTAL HIP ARTHROPLASTY ANTERIOR APPROACH;  Surgeon: Vernetta Lonni GRADE, MD;  Location: [facility];  Service: Orthopedics;  Laterality: Right;   Patient Active Problem List    Diagnosis Date Noted   BMI 40.0-44.9, adult (HCC) current 40.05 10/20/2023   Vitamin D  deficiency 10/20/2023   Other fatigue 10/20/2023   Lactose intolerance 10/20/2023   DOE (dyspnea on exertion) 10/20/2023   Nut allergy 10/20/2023   Herpes simplex type 1 infection 10/13/2022   Chronic interstitial cystitis 10/13/2022   Morbid obesity (HCC) 10/13/2022   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HCC) 05/20/2020   Status post revision of total hip 05/20/2020   Trochanteric bursitis, right hip 09/05/2019   Hyperlipidemia 08/31/2019   Diabetes mellitus (HCC) 08/31/2019   Arthritis 08/31/2019   Overactive bladder 08/31/2019   Hypertensive disorder 08/30/2018   Asthma 08/30/2018   Migraine 08/30/2018   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   Unilateral primary osteoarthritis, right hip 01/27/2017   IUD (intrauterine device) in place 01/13/2017    PCP: [name], MD  REFERRING PROVIDER: Vernetta Lonni GRADE, MD  REFERRING DIAG: [phone] (ICD-10-CM) - Chronic left shoulder pain   THERAPY DIAG:  Chronic left shoulder pain  Stiffness of left shoulder, not elsewhere classified  Muscle weakness (generalized)  Acute pain of left shoulder  Rationale for Evaluation and Treatment: Rehabilitation  ONSET DATE:  11/24/2023 surgery   SUBJECTIVE:                                                                                                                                                                                      SUBJECTIVE STATEMENT: Patient reports minimal stiffness. Hand dominance: Right  PERTINENT HISTORY: L shoulder injection 06/15/23  PAIN:   NPRS scale: 1/10  Pain location: left shoulder anterior Pain description: stinging, burning Aggravating factors: motion Relieving factors: ice  PRECAUTIONS: Lt Shoulder scope 11/24/23 AROM prn- follow biceps tenodesis protocol  RED FLAGS: None   WEIGHT BEARING RESTRICTIONS: No  FALLS:  Has  patient fallen in last 6 months? Unknown  LIVING ENVIRONMENT: Lives with: lives with their family Lives in: House/apartment Has following equipment at home: sling  OCCUPATION: Not working  PLOF: Independent  PATIENT GOALS: decrease pain  NEXT MD VISIT: 12/08/23  OBJECTIVE:  Note: Objective measures were completed at Evaluation unless otherwise noted.  DIAGNOSTIC FINDINGS:  MRI L Shoulder 05/19/23 IMPRESSION: 1. Mild supraspinatus tendinosis without a tear.  PATIENT SURVEYS :  PSFS: THE PATIENT SPECIFIC FUNCTIONAL SCALE  Place score of 0-10 (0 = unable to perform activity and 10 = able to perform activity at the same level as before injury or problem)  Activity Date: 11/28/23 01/13/2024 01/26/24 03/05/24 03/20/24  Do hairstyle 0 2 4 8 9   2.Pick up heavy items 0 4 Gal milk 0 3 5  3.Sleep on arm  0 4 0 5 7  4. Lift arm up 0 3 4 7 9   Total Score 0 3.25 2 5.75 7.5    Total Score = Sum of activity scores/number of activities  Minimally Detectable Change: 3 points (for single activity); 2 points (for average score)  [facility] (nd). The Patient Specific Functional Scale . Retrieved from [email or web address].com.[email or web address]   COGNITION: Eval   Overall cognitive status: Within functional limits for tasks assessed     SENSATION: Eval: WFL  POSTURE: Eval L shoulder higher, rounded shoulders moderate  UPPER EXTREMITY ROM:    AROM/PROM Left eval Left 9/172025 Supine  12/08/2023 12/15/23  01/13/2024 AROM in supine 01/26/24 AROM in supine 02/23/24 AROM in supine 03/16/24 Same   Shoulder flexion 22/90 115 PROM 124deg PROM 132 AA supine 130 138 142 150  Shoulder extension 40/         Shoulder abduction 25/45 90 PROM 90deg PROM (painful)   98 then P 125 130  Shoulder adduction          Shoulder internal rotation Crest/30 at 45    70 AROM in 45 deg abduction Sacrum Crest  crest  Shoulder external rotation 15/20 30  PROM in 30  deg abduction 20deg PROM in 30deg abduction   65 AROM in 45 deg abduction 35 in 45 deg abduction 40 43  Elbow flexion          Elbow extension          Wrist flexion          Wrist extension          Wrist ulnar deviation          Wrist radial deviation          Wrist pronation          Wrist supination          (Blank rows = not tested)  UPPER EXTREMITY MMT:  MMT Left eval LEFT  01/26/24 02/23/24 03/20/24   Shoulder flexion 2+ 3- 3 4  Shoulder extension 3 3+ 3+ 4  Shoulder abduction 2+ 3- 3 4  Shoulder adduction      Shoulder internal rotation 3 3+ 3+ 4  Shoulder external rotation 3 3 3 4   Middle trapezius      Lower trapezius      Elbow flexion      Elbow extension      Wrist flexion      Wrist extension      Wrist ulnar deviation      Wrist radial deviation      Wrist pronation      Wrist supination      Grip strength (lbs)      (Blank rows = not tested)  SHOULDER SPECIAL TESTS: Eval No special tests due to post op status  JOINT MOBILITY TESTING:  Eval Decreased throughout   PALPATION:  Eval 2+ tenderness at incisions and mid deltoid.                     TREATMENT  L Shoulder      DATE:   03/20/24 TherAct:  UBE 3 min each way no resistance Wall ladder into shoulder flexion x10 Supine chest press/flexion with 3# bar 2x10 Scaption 1#  2x10  Shelf taps with 1# DB 10 x full sequence of 3 shelves Neuro Re-Ed:  IR active TB 10x3 with 2-3s hold green ER active with red TB 10x 3 with 2-3s hold  TB rows green  3x10 Ball on wall 20x CW/CCW Review of TB HEP Access Code: [email or web address] URL: [email or web address] Date: 03/20/2024 Prepared by: Burnard Meth  Exercises - Standing Shoulder Row with Anchored Resistance  - 1 x daily - 7 x weekly - 3 sets - 10 reps - Shoulder Extension with Resistance  - 1 x daily - 7 x weekly - 3 sets - 10 reps - Shoulder Internal Rotation with Resistance  - 1 x daily - 7 x weekly - 3 sets - 10 reps - Shoulder External  Rotation with Anchored Resistance  - 1 x daily - 7 x weekly - 3 sets - 10 reps - Standing Shoulder Scaption  - 1 x daily - 7 x weekly - 3 sets - 10 reps  03/16/24 TherAct:  UBE 3 min each way no resistance Wall ladder into shoulder flexion x10 Supine chest press/flexion with 3# bar 2x10 Scaption 1#  2x10  Shelf taps with 1# DB 10 x full sequence of 3 shelves Neuro Re-Ed:  IR active TB 10x3 with 2-3s hold green ER active with red TB 10x 3 with 2-3s hold  TB rows green  3x10 Ball on wall 20x CW/CCW Manual :  PROM/scap mobs   03/05/24 TherAct:  UBE 3 min each way no resistance Wall ladder into shoulder flexion x10 Supine chest press/flexion with 3# bar 2x10 Scaption 1#  2x10  Neuro Re-Ed:  IR active TB 10x3 with 2-3s hold green ER active with red TB 10x 3 with 2-3s hold  TB rows green  3x10 Ball on wall 20x CW/CCW Manual : PROM/scap mobs   PATIENT EDUCATION: Education details: HEP Person educated: Patient Education method: Programmer, Multimedia, Facilities Manager, Actor cues, and Verbal cues Education comprehension: verbalized understanding  HOME EXERCISE PROGRAM: Access Code: [email or web address] URL: [email or web address] Date: 11/28/2023 Prepared by: Burnard Meth  Exercises - Seated Scapular Retraction  - 2 x daily - 7 x weekly - 2 sets - 10 reps - Seated Shoulder Shrugs  - 2 x daily - 7 x weekly - 2 sets - 10 reps - Supine Shoulder Flexion Extension AAROM with Dowel  - 2 x daily - 7 x weekly - 2 sets - 10 reps    ASSESSMENT:  CLINICAL IMPRESSION:  Patient has completed all LTG.   OBJECTIVE IMPAIRMENTS: decreased ROM, decreased strength, hypomobility, impaired UE functional use, and pain.   ACTIVITY LIMITATIONS: carrying, lifting, sleeping, bathing, dressing, self feeding, and reach over head  PARTICIPATION LIMITATIONS: meal prep, cleaning, laundry, driving, and community activity  PERSONAL FACTORS: None- are also affecting patient's functional outcome.   REHAB  POTENTIAL: Good  CLINICAL DECISION MAKING: Stable/uncomplicated  EVALUATION COMPLEXITY: Moderate  GOALS: Goals reviewed with patient? Yes  SHORT TERM GOALS: Target date: 12/19/2023  MET    Pt to be independent with HEP. Baseline: Goal status: MET 12/15/23  2.  Decreased pain by 1 level. Baseline:  Goal status: MET 01/26/24  LONG TERM GOALS: Target date: 03/22/2024 8 more weeks  MET   Patient to be independent in self progressive HEP by discharge. Baseline:  Goal status: MET 03/16/24  2.  Increase active range of motion to WNL throughout. Baseline:  Goal status: MET at [facility] 03/20/24  3.  Increase strength to at least 4/5 throughout shoulder. Baseline: 2+ > 3 Goal status: MET 03/16/24  4.  Reduce pain with all activities to 2 out of 10 or less. Baseline:  Goal status: MET 03/16/24  5.  Improve PSFS score by at least 2 full points for a measurable improvement. Baseline: 0 Goal status:MET 03/05/24  PLAN: PT FREQUENCY: 2x/week  (16 visits + previous auth 17)  PT DURATION: 8 weeks  PLANNED INTERVENTIONS: 02835- PT Re-evaluation, 97110-Therapeutic exercises, 97530- Therapeutic activity, [id number]- Neuromuscular re-education, 97535- Self Care, 02859- Manual therapy, [id number]- Aquatic Therapy, H9716- Electrical stimulation (unattended), 97016- Vasopneumatic device, [id number]- Ultrasound, [id number]- Traction (mechanical), [id number]- Ionotophoresis 4mg /ml Dexamethasone , Patient/Family education, Joint mobilization, Scar mobilization, Cryotherapy, and Moist heat  PLAN FOR NEXT SESSION:   Discharge to HEP.    Burnard Meth, PT 03/20/2024  10:07 AM      "

## 2024-03-20 ENCOUNTER — Ambulatory Visit

## 2024-03-20 DIAGNOSIS — M25512 Pain in left shoulder: Secondary | ICD-10-CM | POA: Diagnosis not present

## 2024-03-20 DIAGNOSIS — M25612 Stiffness of left shoulder, not elsewhere classified: Secondary | ICD-10-CM

## 2024-03-20 DIAGNOSIS — M6281 Muscle weakness (generalized): Secondary | ICD-10-CM

## 2024-03-20 DIAGNOSIS — G8929 Other chronic pain: Secondary | ICD-10-CM | POA: Diagnosis not present

## 2024-03-22 ENCOUNTER — Encounter: Payer: Self-pay | Admitting: Physical Medicine and Rehabilitation

## 2024-03-22 ENCOUNTER — Ambulatory Visit: Admitting: Physical Medicine and Rehabilitation

## 2024-03-22 ENCOUNTER — Encounter

## 2024-03-22 DIAGNOSIS — M545 Low back pain, unspecified: Secondary | ICD-10-CM

## 2024-03-22 DIAGNOSIS — G8929 Other chronic pain: Secondary | ICD-10-CM | POA: Diagnosis not present

## 2024-03-22 DIAGNOSIS — M47819 Spondylosis without myelopathy or radiculopathy, site unspecified: Secondary | ICD-10-CM | POA: Diagnosis not present

## 2024-03-22 DIAGNOSIS — M47816 Spondylosis without myelopathy or radiculopathy, lumbar region: Secondary | ICD-10-CM

## 2024-03-22 MED ORDER — DIAZEPAM 5 MG PO TABS: ORAL_TABLET | ORAL | 0 refills | Status: DC

## 2024-03-22 MED ORDER — PREGABALIN 75 MG PO CAPS: ORAL_CAPSULE | ORAL | 0 refills | Status: AC

## 2024-03-22 NOTE — Progress Notes (Unsigned)
 "  Emily Hester - 54 y.o. female MRN [id number]  Date of birth: 05-13-1970  Office Visit Note: Visit Date: 03/22/2024 PCP: [name], MD Referred by: [name], MD  Subjective: Chief Complaint  Patient presents with   Lower Back - Pain   HPI: Emily Hester is a 54 y.o. female who comes in today for evaluation of chronic, worsening and severe bilateral lower back pain, intermittent radiation of pain down left leg. Also reports tingling to left foot. Pain ongoing for several years, worsens with standing and walking. She describes pain as sore and aching sensation, currently rates as 9 out of 10. Some relief pain with home exercise regimen, rest and use of medications. History of formal physical therapy in the past with minimal relief of pain. Lumbar MRI imaging from 2022 exhibits mild degenerative change at L1-L2, L4-L5, and L5-S1, facet arthropathy more prominent bilaterally at level of L4-L5. History of multiple injections in our office over the years including lumbar epidural steroid injections, right intra-articular hip injection and right greater trochanter injection. She also underwent bilateral L4-L5 radiofrequency ablation in our office on 01/25/2023. She reports greater than 80% relief of pain for 9 months. Feels the ablation was the most beneficial in alleviating her pain. Patient denies focal weakness. No recent trauma or falls.      Review of Systems  Musculoskeletal:  Positive for back pain.  Neurological:  Positive for tingling. Negative for sensory change, focal weakness and weakness.  All other systems reviewed and are negative.  Otherwise per HPI.  Assessment & Plan: Visit Diagnoses:    ICD-10-CM   1. Chronic bilateral low back pain without sciatica  M54.50 Ambulatory referral to Physical Medicine Rehab   G89.29     2. Spondylosis without myelopathy or radiculopathy  M47.819 Ambulatory referral to Physical Medicine Rehab    3. Facet arthropathy,  lumbar  M47.816 Ambulatory referral to Physical Medicine Rehab       Plan: Findings:  Chronic, worsening and severe bilateral lower back pain, intermittent radiation of pain down left leg. Patient continues to have pain despite good conservative therapies such as formal physical therapy, home exercise regimen, rest and use of medications. Patients clinical presentation and exam are consistent with facet mediated pain. Pain radiating down left leg is more of facet joint syndrome. There is bilateral facet arthropathy at L4-L5, more prominent on the right. She has pain with lumbar extension today. Good relief with previous lumbar radiofrequency ablation. We discussed treatment plan in detail today. Next step is to perform bilateral L4-L5 radiofrequency ablation under fluoroscopic guidance. I prescribed pre-procedure Valium  for her to take on day of procedure. We also discussed medication management and I prescribed trial of Lyrica . She has no questions at this time. Her exam today is non focal, good strength noted to bilateral lower extremities.     Meds & Orders:  Meds ordered this encounter  Medications   diazepam  (VALIUM ) 5 MG tablet    Sig: Take one tablet by mouth with light food one hour prior to procedure.    Dispense:  1 tablet    Refill:  0   pregabalin  (LYRICA ) 75 MG capsule    Sig: 1 tablet (75 mg) by mouth at bedtime for one week, then twice a day.    Dispense:  60 capsule    Refill:  0    Orders Placed This Encounter  Procedures   Ambulatory referral to Physical Medicine Rehab    Follow-up: Return for  Bilateral L4-L5 radiofrequency ablation.   Procedures: No procedures performed      Clinical History: MRI LUMBAR SPINE WITHOUT CONTRAST    TECHNIQUE:  Multiplanar, multisequence MR imaging of the lumbar spine was  performed. No intravenous contrast was administered.    COMPARISON:  MRI lumbar spine February 12, 2020.    FINDINGS:  Segmentation: Standard.    Alignment:   Normal.    Vertebrae: No focal marrow edema to suggest acute fracture or  discitis/osteomyelitis. No suspicious bone lesions.    Conus medullaris and cauda equina: Conus extends to the L1 level.  Conus and cauda equina appear normal.    Paraspinal and other soft tissues: Unremarkable.    Disc levels:    T12-L1: No significant disc protrusion, foraminal stenosis, or canal  stenosis.    L1-L2: Similar mild disc desiccation height loss. Similar mild disc  bulge without significant stenosis.    L2-L3: No significant disc protrusion, foraminal stenosis, or canal  stenosis.    L3-L4: No significant disc protrusion, foraminal stenosis, or canal  stenosis.    L4-L5: Similar slight disc bulging and bilateral facet arthropathy  with ligamentum flavum thickening. No significant canal or foraminal  stenosis.    L5-S1: Similar mild disc bulge with annular fissure. No significant  stenosis.    IMPRESSION:  Similar mild degenerative change at L1-L2, L4-L5, and L5-S1  (detailed above) without significant stenosis.      Electronically Signed    By: Gilmore GORMAN Molt M.D.    On: 12/28/2020 14:42   She reports that she has never smoked. She has been exposed to tobacco smoke. She has never used smokeless tobacco.  Recent Labs    10/20/23 1014  HGBA1C 5.7*    Objective:  VS:  HT:    WT:   BMI:     BP:   HR: bpm  TEMP: ( )  RESP:  Physical Exam Vitals and nursing note reviewed.  HENT:     Head: Normocephalic and atraumatic.     Right Ear: External ear normal.     Left Ear: External ear normal.     Nose: Nose normal.     Mouth/Throat:     Mouth: Mucous membranes are moist.  Eyes:     Extraocular Movements: Extraocular movements intact.  Cardiovascular:     Rate and Rhythm: Normal rate.     Pulses: Normal pulses.  Pulmonary:     Effort: Pulmonary effort is normal.  Abdominal:     General: Abdomen is flat. There is no distension.  Musculoskeletal:        General:  Tenderness present.     Cervical back: Normal range of motion.     Comments: Patient rises from seated position to standing without difficulty. Pain noted with lumbar extension and facet loading. 5/5 strength noted with bilateral hip flexion, knee flexion/extension, ankle dorsiflexion/plantarflexion and EHL. No clonus noted bilaterally. No pain upon palpation of greater trochanters. No pain with internal/external rotation of bilateral hips. Sensation intact bilaterally. Negative slump test bilaterally. Ambulates without aid, gait steady.     Skin:    General: Skin is warm and dry.     Capillary Refill: Capillary refill takes less than 2 seconds.  Neurological:     General: No focal deficit present.     Mental Status: She is alert and oriented to person, place, and time.  Psychiatric:        Mood and Affect: Mood normal.  Behavior: Behavior normal.     Ortho Exam  Imaging: No results found.  Past Medical/Family/Surgical/Social History: Medications & Allergies reviewed per EMR, new medications updated. Patient Active Problem List   Diagnosis Date Noted   BMI 40.0-44.9, adult (HCC) current 40.05 10/20/2023   Vitamin D  deficiency 10/20/2023   Other fatigue 10/20/2023   Lactose intolerance 10/20/2023   DOE (dyspnea on exertion) 10/20/2023   Nut allergy 10/20/2023   Herpes simplex type 1 infection 10/13/2022   Chronic interstitial cystitis 10/13/2022   Morbid obesity (HCC) 10/13/2022   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HCC) 05/20/2020   Status post revision of total hip 05/20/2020   Trochanteric bursitis, right hip 09/05/2019   Hyperlipidemia 08/31/2019   Diabetes mellitus (HCC) 08/31/2019   Arthritis 08/31/2019   Overactive bladder 08/31/2019   Hypertensive disorder 08/30/2018   Asthma 08/30/2018   Migraine 08/30/2018   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   Unilateral primary osteoarthritis, right hip 01/27/2017   IUD (intrauterine device) in place  01/13/2017   Past Medical History:  Diagnosis Date   Arthritis    patient denies   Asthma    Back pain    Chest pain    Constipation    Diabetes mellitus without complication (HCC)    type    Food allergy    GERD (gastroesophageal reflux disease)    Heartburn    Hyperlipidemia    Hypertension    Interstitial cystitis    IUD (intrauterine device) in place placed 6 weeks ago   Joint pain    Lactose intolerance    Migraines    MIgraines- not current     OSA (obstructive sleep apnea)    Osteoarthritis    Palpitations    Pneumonia    SOB (shortness of breath)    Swallowing problem    Vitamin D  deficiency    Family History  Problem Relation Age of Onset   Stroke Mother    Hyperlipidemia Mother    Hypertension Mother    Diabetes Mother    Heart disease Mother    Hyperlipidemia Father    Hypertension Father    Diabetes Father    Eczema Sister    Asthma Brother    Eczema Daughter    Migraines Daughter    Migraines Daughter    Asthma Son    Allergic rhinitis Neg Hx    Angioedema Neg Hx    Immunodeficiency Neg Hx    Urticaria Neg Hx    Headache Neg Hx    Sleep apnea Neg Hx    Past Surgical History:  Procedure Laterality Date   ANTERIOR HIP REVISION Right 05/20/2020   Procedure: RIGHT TOTAL HIP REVISION- ploy exhange and hip ball exchange with bone graft;  Surgeon: Vernetta Lonni GRADE, MD;  Location: [facility];  Service: Orthopedics;  Laterality: Right;   DILATION AND EVACUATION  09/30/2011   Procedure: DILATATION AND EVACUATION;  Surgeon: Lynwood FORBES Curlene PONCE, MD;  Location: [facility];  Service: Gynecology;  Laterality: N/A;   left knee surgery      arthroscopy    left shoulder surgery  11/24/2023   TOTAL HIP ARTHROPLASTY Right 03/11/2017   Procedure: RIGHT TOTAL HIP ARTHROPLASTY ANTERIOR APPROACH;  Surgeon: Vernetta Lonni GRADE, MD;  Location: [facility];  Service: Orthopedics;  Laterality: Right;   Social History   Occupational History   Not on file  Tobacco Use    Smoking status: Never    Passive exposure: Current   Smokeless  tobacco: Never  Vaping Use   Vaping status: Never Used  Substance and Sexual Activity   Alcohol use: No   Drug use: No   Sexual activity: Not on file    "

## 2024-03-22 NOTE — Progress Notes (Unsigned)
 Pain Scale   Average Pain 9 Patient advising that she has chronic lower back pain radiating to left leg. Pain is constant        +Driver, -BT, -Dye Allergies.

## 2024-04-02 ENCOUNTER — Ambulatory Visit (INDEPENDENT_AMBULATORY_CARE_PROVIDER_SITE_OTHER): Admitting: Family Medicine

## 2024-04-02 ENCOUNTER — Encounter (INDEPENDENT_AMBULATORY_CARE_PROVIDER_SITE_OTHER): Payer: Self-pay | Admitting: Family Medicine

## 2024-04-02 VITALS — BP 115/76 | HR 77 | Temp 98.2°F | Ht 64.5 in | Wt 236.0 lb

## 2024-04-02 DIAGNOSIS — E1169 Type 2 diabetes mellitus with other specified complication: Secondary | ICD-10-CM | POA: Diagnosis not present

## 2024-04-02 DIAGNOSIS — Z6841 Body Mass Index (BMI) 40.0 and over, adult: Secondary | ICD-10-CM

## 2024-04-02 DIAGNOSIS — Z6839 Body mass index (BMI) 39.0-39.9, adult: Secondary | ICD-10-CM

## 2024-04-02 DIAGNOSIS — Z7985 Long-term (current) use of injectable non-insulin antidiabetic drugs: Secondary | ICD-10-CM | POA: Diagnosis not present

## 2024-04-02 DIAGNOSIS — Z7984 Long term (current) use of oral hypoglycemic drugs: Secondary | ICD-10-CM | POA: Diagnosis not present

## 2024-04-02 DIAGNOSIS — E669 Obesity, unspecified: Secondary | ICD-10-CM | POA: Diagnosis not present

## 2024-04-02 DIAGNOSIS — E559 Vitamin D deficiency, unspecified: Secondary | ICD-10-CM | POA: Diagnosis not present

## 2024-04-02 DIAGNOSIS — E785 Hyperlipidemia, unspecified: Secondary | ICD-10-CM

## 2024-04-02 MED ORDER — VITAMIN D 50 MCG (2000 UT) PO TABS: 2000.0000 [IU] | ORAL_TABLET | Freq: Every day | ORAL | 1 refills | Status: AC

## 2024-04-02 MED ORDER — TIRZEPATIDE 12.5 MG/0.5ML ~~LOC~~ SOAJ: 12.5000 mg | SUBCUTANEOUS | 0 refills | Status: AC

## 2024-04-02 NOTE — Progress Notes (Signed)
 "  Office: [phone]  /  Fax: [phone]  WEIGHT SUMMARY AND BIOMETRICS  Anthropometric Measurements Height: 5' 4.5 (1.638 m) Weight: 236 lb (107 kg) BMI (Calculated): 39.9 Weight at Last Visit: 228 lb Weight Lost Since Last Visit: 0 Weight Gained Since Last Visit: 8 lb Starting Weight: 237 lb Total Weight Loss (lbs): 1 lb (0.454 kg) Peak Weight: 280 lb   Body Composition  Body Fat %: 47.3 % Fat Mass (lbs): 111.6 lbs Muscle Mass (lbs): 118.2 lbs Total Body Water  (lbs): 84.6 lbs Visceral Fat Rating : 14   Other Clinical Data Fasting: yes Labs: no Today's Visit #: 5 Starting Date: 10/20/23    Chief Complaint: OBESITY   History of Present Illness Emily Hester is a 54 year old female with obesity and type 2 diabetes who presents for obesity treatment and progress assessment.  She has been following the category two eating plan but adheres to it only about 25% of the time. Over the last three months, she has gained eight pounds, particularly during the holiday season. Her exercise routine was interrupted due to shoulder surgery, and she has been using physical therapy as her form of exercise, which she completed last week. She struggles to meet her protein goals and often skips meals, which is not aiding her weight loss efforts.  She is managing type 2 diabetes with metformin  XR 750 mg twice daily and Mounjaro  12.5 mg weekly. However, she missed the last month of Mounjaro , leading to increased cravings for sweets, which she describes as 'uncontrollable'. She always wants something sweet, although not in large quantities.  She is being treated for vitamin D  deficiency with vitamin D3, 2000 IU daily, and requests a refill for her prescription.  She reports difficulty in consuming enough protein and has recently purchased a lactose-free protein shake with 30 grams of protein per serving, which she plans to try. She is lactose intolerant and hopes this will help her  meet her protein needs. She has not yet started using the protein shake but plans to incorporate it into her diet.  She hosted Christmas at her home due to her sister's house issues, which added to her stress during the holidays. She wants to improve her meal planning and preparation, particularly focusing on protein intake. She is not very hungry in the mornings and has not yet tried using protein shakes for breakfast.      PHYSICAL EXAM:  Blood pressure 115/76, pulse 77, temperature 98.2 F (36.8 C), height 5' 4.5 (1.638 m), weight 236 lb (107 kg), SpO2 99%. Body mass index is 39.88 kg/m.  DIAGNOSTIC DATA REVIEWED BY MYSELF TODAY:  BMET    Component Value Date/Time   NA 139 10/20/2023 1014   K 4.2 10/20/2023 1014   CL 103 10/20/2023 1014   CO2 19 (L) 10/20/2023 1014   GLUCOSE 73 10/20/2023 1014   GLUCOSE 126 (H) 03/13/2023 0209   BUN 12 10/20/2023 1014   CREATININE 0.77 10/20/2023 1014   CREATININE 0.81 04/16/2012 1055   CALCIUM  9.7 10/20/2023 1014   GFRNONAA >60 03/13/2023 0209   GFRAA >60 01/04/2018 1351   Lab Results  Component Value Date   HGBA1C 5.7 (H) 10/20/2023   HGBA1C 6.5 (H) 03/03/2017   Lab Results  Component Value Date   INSULIN  23.8 10/20/2023   Lab Results  Component Value Date   TSH 1.670 10/20/2023   CBC    Component Value Date/Time   WBC 10.1 10/20/2023 1014   WBC 8.8  03/13/2023 0209   RBC 5.04 10/20/2023 1014   RBC 4.96 03/13/2023 0209   HGB 13.2 10/20/2023 1014   HCT 42.8 10/20/2023 1014   PLT 291 10/20/2023 1014   MCV 85 10/20/2023 1014   MCH 26.2 (L) 10/20/2023 1014   MCH 27.0 03/13/2023 0209   MCHC 30.8 (L) 10/20/2023 1014   MCHC 31.8 03/13/2023 0209   RDW 14.7 10/20/2023 1014   Iron Studies No results found for: IRON, TIBC, FERRITIN, IRONPCTSAT Lipid Panel     Component Value Date/Time   CHOL 157 10/20/2023 1014   TRIG 138 10/20/2023 1014   HDL 50 10/20/2023 1014   LDLCALC 83 10/20/2023 1014   Hepatic Function  Panel     Component Value Date/Time   PROT 7.6 10/20/2023 1014   ALBUMIN 4.7 10/20/2023 1014   AST 17 10/20/2023 1014   ALT 16 10/20/2023 1014   ALKPHOS 143 (H) 10/20/2023 1014   BILITOT 0.5 10/20/2023 1014      Component Value Date/Time   TSH 1.670 10/20/2023 1014   Nutritional Lab Results  Component Value Date   VD25OH 33.4 10/20/2023     Assessment and Plan Assessment & Plan Obesity Recent weight gain of 8 pounds over the last three months, exacerbated by holiday indulgences and shoulder surgery. Difficulty meeting protein goals and skipping meals. Increased cravings for sweets, likely due to discontinuation of Mounjaro . Muscle mass has increased slightly, indicating some benefit from physical therapy. - Restart Mounjaro  12.5 mg weekly. - Provided a packet of protein-rich recipes, including slow cooker options. - Encouraged journaling of food intake to track calories and protein. - Advised on maintaining a protein intake of at least 100 grams per day. - Discussed options for low-calorie bread to support dietary goals.  Type 2 diabetes mellitus Managed with metformin  XR 750 mg twice daily and Mounjaro  12.5 mg weekly. Missed last month's dose of Mounjaro , which may have contributed to increased cravings and weight gain. Diabetes complicates weight loss efforts due to metabolic mechanisms. - Restart Mounjaro  12.5 mg weekly. - Continue metformin  XR 750 mg twice daily. - Continue diet, exercise and weight loss as discussed today as an important part of the treatment plan   Vitamin D  deficiency Managed with over-the-counter vitamin D3 2000 IU daily. Prescription vitamin D  is also indicated. - Sent prescription for vitamin D  to on [employer].      Patients who are on anti-obesity medications are counseled on the importance of maintaining healthy lifestyle habits, including balanced nutrition, regular physical activity, and behavioral modifications,  Medication is an  adjunct to, not a replacement for, lifestyle changes and that the long-term success and weight maintenance depend on continued adherence to these strategies.   Emily Hester was informed of the importance of frequent follow up visits to maximize her success with intensive lifestyle modifications for her obesity and obesity related health conditions as recommended by USPSTF and CMS guidelines  Louann Penton, MD   "

## 2024-04-04 ENCOUNTER — Ambulatory Visit: Admitting: Orthopaedic Surgery

## 2024-04-04 DIAGNOSIS — M7062 Trochanteric bursitis, left hip: Secondary | ICD-10-CM | POA: Diagnosis not present

## 2024-04-04 MED ORDER — METHYLPREDNISOLONE ACETATE 40 MG/ML IJ SUSP
40.0000 mg | INTRAMUSCULAR | Status: AC | PRN
  Administered 2024-04-04: 40 mg via INTRA_ARTICULAR

## 2024-04-04 MED ORDER — LIDOCAINE 5 % EX PTCH: 1.0000 | MEDICATED_PATCH | Freq: Every day | CUTANEOUS | 3 refills | Status: AC | PRN

## 2024-04-04 MED ORDER — LIDOCAINE HCL 1 % IJ SOLN
3.0000 mL | INTRAMUSCULAR | Status: AC | PRN
  Administered 2024-04-04: 3 mL

## 2024-04-04 NOTE — Progress Notes (Signed)
 The patient is well-known to us .  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  She has had these in the past.  She has been trying topical anti-inflammatories and does need a refill of a Lidoderm  patch.  She does have a radiofrequency ablation she says next week in her spine.  On examination her left hip does have significant pain to palpation of the trochanteric area of the left hip.  Previous x-rays show a normal-appearing hip.  Per request I did place a steroid injection of her left hip trochanteric area and send in some lidocaine  patches.  She is going to consider CBD cream or oral from a hip store and I think this is reasonable and actually talked her about it.    Procedure Note  [name]             Date of Birth: [birth year]           MRN: [id number]             Visit Date: 04/04/2024  Procedures: Visit Diagnoses:  1. Trochanteric bursitis, left hip     Large Joint Inj: L greater trochanter on 04/04/2024 9:20 AM Indications: pain and diagnostic evaluation Details: 22 G 1.5 in needle, lateral approach  Arthrogram: No  Medications: 3 mL lidocaine  1 %; 40 mg methylPREDNISolone  acetate 40 MG/ML Outcome: tolerated well, no immediate complications Procedure, treatment alternatives, risks and benefits explained, specific risks discussed. Consent was given by the patient. Immediately prior to procedure a time out was called to verify the correct patient, procedure, equipment, support staff and site/side marked as required. Patient was prepped and draped in the usual sterile fashion.

## 2024-04-10 ENCOUNTER — Other Ambulatory Visit: Payer: Self-pay

## 2024-04-10 ENCOUNTER — Ambulatory Visit: Admitting: Physical Medicine and Rehabilitation

## 2024-04-10 VITALS — BP 118/80 | HR 93

## 2024-04-10 DIAGNOSIS — M47816 Spondylosis without myelopathy or radiculopathy, lumbar region: Secondary | ICD-10-CM

## 2024-04-10 MED ORDER — METHYLPREDNISOLONE ACETATE 40 MG/ML IJ SUSP: 40.0000 mg | Freq: Once | INTRAMUSCULAR | Status: AC

## 2024-04-10 NOTE — Procedures (Signed)
 Lumbar Facet Joint Nerve Denervation  [name]      Date of Birth: [birth year] MRN: [id number] PCP: [name], MD      Visit Date:    Universal Protocol:    Date/Time: 01/27/263:08 PM  Consent Given By: the patient  Position: PRONE  Additional Comments: Vital signs were monitored before and after the procedure. Patient was prepped and draped in the usual sterile fashion. The correct patient, procedure, and site was verified.   Injection Procedure Details:   Procedure diagnoses: Spondylosis without myelopathy or radiculopathy, lumbar region  (primary encounter diagnosis)   Meds Administered: Orders Placed This Encounter     methylPREDNISolone  acetate (DEPO-MEDROL ) injection 40 mg    Laterality: Bilateral  Location/Site:  L4-L5, L3 and L4 medial branches  Needle: 18 ga.,  10mm active tip, RF Cannula  Needle Placement: Along juncture of superior articular process and transverse pocess  Findings:  -Comments:  Procedure Details: For each desired target nerve, the corresponding transverse process (sacral ala for the L5 dorsal rami) was identified and the fluoroscope was positioned to square off the endplates of the corresponding vertebral body to achieve a true AP midline view.  The beam was then obliqued 15 to 20 degrees and caudally tilted 15 to 20 degrees to line up a trajectory along the target nerves. The skin over the target of the junction of superior articulating process and transverse process (sacral ala for the L5 dorsal rami) was infiltrated with 1ml of 1% Lidocaine  without Epinephrine .  The 18 gauge 10mm active tip outer cannula was advanced in trajectory view to the target.  This procedure was repeated for each target nerve.  Then, for all levels, the outer cannula placement was fine-tuned and the position was then confirmed with bi-planar imaging.    Test stimulation was done both at sensory and motor levels to ensure there was no  radicular stimulation. The target tissues were then infiltrated with 1 ml of 1% Lidocaine  without Epinephrine . Subsequently, a percutaneous neurotomy was carried out for 90 seconds at 80 degrees Celsius.  After the completion of the lesion, 1 ml of injectate was delivered. It was then repeated for each facet joint nerve mentioned above. Appropriate radiographs were obtained to verify the probe placement during the neurotomy.   Additional Comments:  The patient tolerated the procedure well Dressing: 2 x 2 sterile gauze and Band-Aid    Post-procedure details: Patient was observed during the procedure. Post-procedure instructions were reviewed.  Patient left the clinic in stable condition.

## 2024-04-10 NOTE — Progress Notes (Signed)
 "  Emily Hester - 54 y.o. female MRN [id number]  Date of birth: [birth year]  Office Visit Note: Visit Date: 04/10/2024 PCP: [name], MD Referred by: [name], MD  Subjective: Chief Complaint  Patient presents with   Lower Back - Pain   HPI:  Emily Hester is a 54 y.o. female who comes in todayfor planned repeat radiofrequency ablation of the Bilateral L4-5  Lumbar facet joints. This would be ablation of the corresponding medial branches and/or dorsal rami.  Patient has had double diagnostic blocks with more than 70% relief.  Subsequent ablation gave them more than 6 months of over 60% relief.  They have had chronic back pain for quite some time, more than 3 months, which has been an ongoing situation with recalcitrant axial back pain.  They have no radicular pain.  Their axial pain is worse with standing and ambulating and on exam today with facet loading.  They have had physical therapy as well as home exercise program.  The imaging noted in the chart below indicated facet pathology. Accordingly they meet all the criteria and qualification for for radiofrequency ablation and we are going to complete this today hopefully for more longer term relief as part of comprehensive management program.   ROS Otherwise per HPI.  Assessment & Plan: Visit Diagnoses:    ICD-10-CM   1. Spondylosis without myelopathy or radiculopathy, lumbar region  M47.816 XR C-ARM NO REPORT    Radiofrequency,Lumbar    methylPREDNISolone  acetate (DEPO-MEDROL ) injection 40 mg      Plan: No additional findings.   Meds & Orders:  Meds ordered this encounter  Medications   methylPREDNISolone  acetate (DEPO-MEDROL ) injection 40 mg    Orders Placed This Encounter  Procedures   Radiofrequency,Lumbar   XR C-ARM NO REPORT    Follow-up: Return for visit to requesting provider as needed.   Procedures: No procedures performed  Lumbar Facet Joint Nerve Denervation  [name]      Date of Birth: 1970/10/10 MRN: [id number] PCP: [name], MD      Visit Date:    Universal Protocol:    Date/Time: 01/27/263:08 PM  Consent Given By: the patient  Position: PRONE  Additional Comments: Vital signs were monitored before and after the procedure. Patient was prepped and draped in the usual sterile fashion. The correct patient, procedure, and site was verified.   Injection Procedure Details:   Procedure diagnoses: Spondylosis without myelopathy or radiculopathy, lumbar region  (primary encounter diagnosis)   Meds Administered: Orders Placed This Encounter     methylPREDNISolone  acetate (DEPO-MEDROL ) injection 40 mg    Laterality: Bilateral  Location/Site:  L4-L5, L3 and L4 medial branches  Needle: 18 ga.,  10mm active tip, RF Cannula  Needle Placement: Along juncture of superior articular process and transverse pocess  Findings:  -Comments:  Procedure Details: For each desired target nerve, the corresponding transverse process (sacral ala for the L5 dorsal rami) was identified and the fluoroscope was positioned to square off the endplates of the corresponding vertebral body to achieve a true AP midline view.  The beam was then obliqued 15 to 20 degrees and caudally tilted 15 to 20 degrees to line up a trajectory along the target nerves. The skin over the target of the junction of superior articulating process and transverse process (sacral ala for the L5 dorsal rami) was infiltrated with 1ml of 1% Lidocaine  without Epinephrine .  The 18 gauge 10mm active tip outer cannula was advanced in  trajectory view to the target.  This procedure was repeated for each target nerve.  Then, for all levels, the outer cannula placement was fine-tuned and the position was then confirmed with bi-planar imaging.    Test stimulation was done both at sensory and motor levels to ensure there was no radicular stimulation. The target tissues were then infiltrated  with 1 ml of 1% Lidocaine  without Epinephrine . Subsequently, a percutaneous neurotomy was carried out for 90 seconds at 80 degrees Celsius.  After the completion of the lesion, 1 ml of injectate was delivered. It was then repeated for each facet joint nerve mentioned above. Appropriate radiographs were obtained to verify the probe placement during the neurotomy.   Additional Comments:  The patient tolerated the procedure well Dressing: 2 x 2 sterile gauze and Band-Aid    Post-procedure details: Patient was observed during the procedure. Post-procedure instructions were reviewed.  Patient left the clinic in stable condition.     Clinical History: MRI LUMBAR SPINE WITHOUT CONTRAST    TECHNIQUE:  Multiplanar, multisequence MR imaging of the lumbar spine was  performed. No intravenous contrast was administered.    COMPARISON:  MRI lumbar spine February 12, 2020.    FINDINGS:  Segmentation: Standard.    Alignment:  Normal.    Vertebrae: No focal marrow edema to suggest acute fracture or  discitis/osteomyelitis. No suspicious bone lesions.    Conus medullaris and cauda equina: Conus extends to the L1 level.  Conus and cauda equina appear normal.    Paraspinal and other soft tissues: Unremarkable.    Disc levels:    T12-L1: No significant disc protrusion, foraminal stenosis, or canal  stenosis.    L1-L2: Similar mild disc desiccation height loss. Similar mild disc  bulge without significant stenosis.    L2-L3: No significant disc protrusion, foraminal stenosis, or canal  stenosis.    L3-L4: No significant disc protrusion, foraminal stenosis, or canal  stenosis.    L4-L5: Similar slight disc bulging and bilateral facet arthropathy  with ligamentum flavum thickening. No significant canal or foraminal  stenosis.    L5-S1: Similar mild disc bulge with annular fissure. No significant  stenosis.    IMPRESSION:  Similar mild degenerative change at L1-L2, L4-L5, and L5-S1   (detailed above) without significant stenosis.      Electronically Signed    By: Gilmore GORMAN Molt M.D.    On: 12/28/2020 14:42     Objective:  VS:  HT:    WT:   BMI:     BP:118/80  HR:93bpm  TEMP: ( )  RESP:  Physical Exam Vitals and nursing note reviewed.  Constitutional:      General: She is not in acute distress.    Appearance: Normal appearance. She is not ill-appearing.  HENT:     Head: Normocephalic and atraumatic.     Right Ear: External ear normal.     Left Ear: External ear normal.  Eyes:     Extraocular Movements: Extraocular movements intact.  Cardiovascular:     Rate and Rhythm: Normal rate.     Pulses: Normal pulses.  Pulmonary:     Effort: Pulmonary effort is normal. No respiratory distress.  Abdominal:     General: There is no distension.     Palpations: Abdomen is soft.  Musculoskeletal:        General: Tenderness present.     Cervical back: Neck supple.     Right lower leg: No edema.     Left lower leg: No  edema.     Comments: Patient has good distal strength with no pain over the greater trochanters.  No clonus or focal weakness.  Skin:    Findings: No erythema, lesion or rash.  Neurological:     General: No focal deficit present.     Mental Status: She is alert and oriented to person, place, and time.     Sensory: No sensory deficit.     Motor: No weakness or abnormal muscle tone.     Coordination: Coordination normal.  Psychiatric:        Mood and Affect: Mood normal.        Behavior: Behavior normal.      Imaging: No results found. "

## 2024-05-01 ENCOUNTER — Ambulatory Visit (INDEPENDENT_AMBULATORY_CARE_PROVIDER_SITE_OTHER): Admitting: Family Medicine

## 2024-07-05 ENCOUNTER — Ambulatory Visit: Admitting: Allergy
